# Patient Record
Sex: Male | Born: 1950 | Race: White | Hispanic: No | Smoking: Former smoker
Health system: Southern US, Community
[De-identification: ages and names within clinical notes are randomized; demographics above are authoritative.]

## PROBLEM LIST (undated history)

## (undated) DIAGNOSIS — M779 Enthesopathy, unspecified: Secondary | ICD-10-CM

## (undated) DIAGNOSIS — E7800 Pure hypercholesterolemia, unspecified: Secondary | ICD-10-CM

## (undated) DIAGNOSIS — M7060 Trochanteric bursitis, unspecified hip: Secondary | ICD-10-CM

## (undated) DIAGNOSIS — I10 Essential (primary) hypertension: Secondary | ICD-10-CM

## (undated) DIAGNOSIS — R519 Headache, unspecified: Secondary | ICD-10-CM

## (undated) DIAGNOSIS — F419 Anxiety disorder, unspecified: Secondary | ICD-10-CM

## (undated) DIAGNOSIS — E119 Type 2 diabetes mellitus without complications: Secondary | ICD-10-CM

## (undated) DIAGNOSIS — G8929 Other chronic pain: Secondary | ICD-10-CM

## (undated) DIAGNOSIS — E669 Obesity, unspecified: Secondary | ICD-10-CM

## (undated) DIAGNOSIS — I2510 Atherosclerotic heart disease of native coronary artery without angina pectoris: Secondary | ICD-10-CM

## (undated) DIAGNOSIS — M452 Ankylosing spondylitis of cervical region: Secondary | ICD-10-CM

## (undated) DIAGNOSIS — M549 Dorsalgia, unspecified: Secondary | ICD-10-CM

## (undated) DIAGNOSIS — M542 Cervicalgia: Secondary | ICD-10-CM

## (undated) DIAGNOSIS — K7460 Unspecified cirrhosis of liver: Secondary | ICD-10-CM

## (undated) DIAGNOSIS — F329 Major depressive disorder, single episode, unspecified: Secondary | ICD-10-CM

## (undated) DIAGNOSIS — N400 Enlarged prostate without lower urinary tract symptoms: Secondary | ICD-10-CM

## (undated) DIAGNOSIS — K219 Gastro-esophageal reflux disease without esophagitis: Secondary | ICD-10-CM

## (undated) DIAGNOSIS — M456 Ankylosing spondylitis lumbar region: Secondary | ICD-10-CM

## (undated) DIAGNOSIS — M1990 Unspecified osteoarthritis, unspecified site: Secondary | ICD-10-CM

## (undated) DIAGNOSIS — Z8719 Personal history of other diseases of the digestive system: Secondary | ICD-10-CM

## (undated) DIAGNOSIS — R51 Headache: Secondary | ICD-10-CM

## (undated) DIAGNOSIS — F32A Depression, unspecified: Secondary | ICD-10-CM

## (undated) DIAGNOSIS — M75100 Unspecified rotator cuff tear or rupture of unspecified shoulder, not specified as traumatic: Secondary | ICD-10-CM

## (undated) HISTORY — DX: Obesity, unspecified: E66.9

## (undated) HISTORY — PX: COLON SURGERY: SHX602

## (undated) HISTORY — DX: Major depressive disorder, single episode, unspecified: F32.9

## (undated) HISTORY — DX: Trochanteric bursitis, unspecified hip: M70.60

## (undated) HISTORY — DX: Enthesopathy, unspecified: M77.9

## (undated) HISTORY — PX: FRACTURE SURGERY: SHX138

## (undated) HISTORY — PX: CARPAL TUNNEL RELEASE: SHX101

## (undated) HISTORY — DX: Depression, unspecified: F32.A

## (undated) HISTORY — PX: OTHER SURGICAL HISTORY: SHX169

## (undated) HISTORY — DX: Ankylosing spondylitis of cervical region: M45.2

## (undated) HISTORY — PX: CORONARY ANGIOPLASTY WITH STENT PLACEMENT: SHX49

## (undated) HISTORY — DX: Ankylosing spondylitis lumbar region: M45.6

## (undated) HISTORY — DX: Anxiety disorder, unspecified: F41.9

## (undated) HISTORY — PX: LUMBAR DISC SURGERY: SHX700

## (undated) HISTORY — DX: Unspecified rotator cuff tear or rupture of unspecified shoulder, not specified as traumatic: M75.100

## (undated) HISTORY — PX: BACK SURGERY: SHX140

## (undated) HISTORY — PX: ANAL FISSURE REPAIR: SHX2312

## (undated) HISTORY — PX: SHOULDER ARTHROSCOPY W/ ROTATOR CUFF REPAIR: SHX2400

## (undated) HISTORY — PX: ANKLE FRACTURE SURGERY: SHX122

## (undated) HISTORY — PX: ULNAR TUNNEL RELEASE: SHX820

## (undated) HISTORY — DX: Essential (primary) hypertension: I10

---
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
## 1999-09-19 ENCOUNTER — Encounter: Payer: Self-pay | Admitting: *Deleted

## 1999-09-19 ENCOUNTER — Ambulatory Visit (HOSPITAL_COMMUNITY): Admission: RE | Admit: 1999-09-19 | Discharge: 1999-09-19 | Payer: Self-pay | Admitting: *Deleted

## 1999-10-02 ENCOUNTER — Encounter: Payer: Self-pay | Admitting: *Deleted

## 1999-10-02 ENCOUNTER — Ambulatory Visit (HOSPITAL_COMMUNITY): Admission: RE | Admit: 1999-10-02 | Discharge: 1999-10-02 | Payer: Self-pay | Admitting: *Deleted

## 1999-10-16 ENCOUNTER — Encounter: Payer: Self-pay | Admitting: *Deleted

## 1999-10-16 ENCOUNTER — Ambulatory Visit (HOSPITAL_COMMUNITY): Admission: RE | Admit: 1999-10-16 | Discharge: 1999-10-16 | Payer: Self-pay | Admitting: *Deleted

## 2001-02-01 ENCOUNTER — Encounter: Admission: RE | Admit: 2001-02-01 | Discharge: 2001-02-01 | Payer: Self-pay | Admitting: Neurosurgery

## 2001-02-01 ENCOUNTER — Encounter: Payer: Self-pay | Admitting: Neurosurgery

## 2002-02-08 ENCOUNTER — Encounter: Payer: Self-pay | Admitting: Emergency Medicine

## 2002-02-08 ENCOUNTER — Inpatient Hospital Stay (HOSPITAL_COMMUNITY): Admission: EM | Admit: 2002-02-08 | Discharge: 2002-02-11 | Payer: Self-pay | Admitting: Emergency Medicine

## 2002-02-08 ENCOUNTER — Encounter: Payer: Self-pay | Admitting: Orthopaedic Surgery

## 2002-02-09 ENCOUNTER — Encounter: Payer: Self-pay | Admitting: Orthopaedic Surgery

## 2002-04-25 ENCOUNTER — Ambulatory Visit (HOSPITAL_BASED_OUTPATIENT_CLINIC_OR_DEPARTMENT_OTHER): Admission: RE | Admit: 2002-04-25 | Discharge: 2002-04-25 | Payer: Self-pay | Admitting: Orthopaedic Surgery

## 2002-09-25 ENCOUNTER — Encounter: Admission: RE | Admit: 2002-09-25 | Discharge: 2002-12-24 | Payer: Self-pay | Admitting: Internal Medicine

## 2002-10-18 ENCOUNTER — Ambulatory Visit (HOSPITAL_COMMUNITY): Admission: RE | Admit: 2002-10-18 | Discharge: 2002-10-18 | Payer: Self-pay | Admitting: Internal Medicine

## 2002-10-30 ENCOUNTER — Encounter: Payer: Self-pay | Admitting: Orthopedic Surgery

## 2002-10-30 ENCOUNTER — Encounter
Admission: RE | Admit: 2002-10-30 | Discharge: 2003-01-28 | Payer: Self-pay | Admitting: Physical Medicine & Rehabilitation

## 2002-10-30 ENCOUNTER — Ambulatory Visit (HOSPITAL_COMMUNITY): Admission: RE | Admit: 2002-10-30 | Discharge: 2002-10-30 | Payer: Self-pay | Admitting: Orthopedic Surgery

## 2003-01-31 ENCOUNTER — Encounter
Admission: RE | Admit: 2003-01-31 | Discharge: 2003-05-01 | Payer: Self-pay | Admitting: Physical Medicine & Rehabilitation

## 2003-02-01 ENCOUNTER — Ambulatory Visit (HOSPITAL_COMMUNITY): Admission: RE | Admit: 2003-02-01 | Discharge: 2003-02-01 | Payer: Self-pay | Admitting: General Surgery

## 2003-02-08 ENCOUNTER — Encounter: Admission: RE | Admit: 2003-02-08 | Discharge: 2003-05-09 | Payer: Self-pay | Admitting: Internal Medicine

## 2003-05-10 ENCOUNTER — Encounter
Admission: RE | Admit: 2003-05-10 | Discharge: 2003-08-08 | Payer: Self-pay | Admitting: Physical Medicine & Rehabilitation

## 2003-08-16 ENCOUNTER — Encounter
Admission: RE | Admit: 2003-08-16 | Discharge: 2003-10-30 | Payer: Self-pay | Admitting: Physical Medicine & Rehabilitation

## 2003-09-27 ENCOUNTER — Ambulatory Visit (HOSPITAL_COMMUNITY): Admission: RE | Admit: 2003-09-27 | Discharge: 2003-09-27 | Payer: Self-pay | Admitting: General Surgery

## 2003-09-27 ENCOUNTER — Encounter (INDEPENDENT_AMBULATORY_CARE_PROVIDER_SITE_OTHER): Payer: Self-pay | Admitting: *Deleted

## 2003-09-27 ENCOUNTER — Ambulatory Visit (HOSPITAL_BASED_OUTPATIENT_CLINIC_OR_DEPARTMENT_OTHER): Admission: RE | Admit: 2003-09-27 | Discharge: 2003-09-27 | Payer: Self-pay | Admitting: General Surgery

## 2003-10-30 ENCOUNTER — Ambulatory Visit: Payer: Self-pay | Admitting: Physical Medicine & Rehabilitation

## 2003-10-30 ENCOUNTER — Encounter
Admission: RE | Admit: 2003-10-30 | Discharge: 2004-01-28 | Payer: Self-pay | Admitting: Physical Medicine & Rehabilitation

## 2004-02-20 ENCOUNTER — Encounter
Admission: RE | Admit: 2004-02-20 | Discharge: 2004-05-19 | Payer: Self-pay | Admitting: Physical Medicine & Rehabilitation

## 2004-02-22 ENCOUNTER — Ambulatory Visit: Payer: Self-pay | Admitting: Physical Medicine & Rehabilitation

## 2004-03-20 ENCOUNTER — Ambulatory Visit (HOSPITAL_COMMUNITY): Admission: RE | Admit: 2004-03-20 | Discharge: 2004-03-20 | Payer: Self-pay

## 2004-03-20 ENCOUNTER — Encounter (INDEPENDENT_AMBULATORY_CARE_PROVIDER_SITE_OTHER): Payer: Self-pay | Admitting: Specialist

## 2004-03-20 ENCOUNTER — Encounter (INDEPENDENT_AMBULATORY_CARE_PROVIDER_SITE_OTHER): Payer: Self-pay | Admitting: *Deleted

## 2004-03-27 ENCOUNTER — Ambulatory Visit (HOSPITAL_COMMUNITY): Admission: RE | Admit: 2004-03-27 | Discharge: 2004-03-27 | Payer: Self-pay | Admitting: Internal Medicine

## 2004-03-27 ENCOUNTER — Encounter (INDEPENDENT_AMBULATORY_CARE_PROVIDER_SITE_OTHER): Payer: Self-pay | Admitting: *Deleted

## 2004-04-08 ENCOUNTER — Encounter (HOSPITAL_COMMUNITY): Admission: RE | Admit: 2004-04-08 | Discharge: 2004-07-07 | Payer: Self-pay | Admitting: Internal Medicine

## 2004-05-15 ENCOUNTER — Encounter (HOSPITAL_COMMUNITY): Admission: RE | Admit: 2004-05-15 | Discharge: 2004-08-13 | Payer: Self-pay | Admitting: Internal Medicine

## 2004-05-19 ENCOUNTER — Encounter
Admission: RE | Admit: 2004-05-19 | Discharge: 2004-08-17 | Payer: Self-pay | Admitting: Physical Medicine & Rehabilitation

## 2004-05-22 ENCOUNTER — Ambulatory Visit: Payer: Self-pay | Admitting: Physical Medicine & Rehabilitation

## 2004-07-16 ENCOUNTER — Ambulatory Visit: Payer: Self-pay | Admitting: Physical Medicine and Rehabilitation

## 2004-08-04 ENCOUNTER — Encounter (HOSPITAL_COMMUNITY): Admission: RE | Admit: 2004-08-04 | Discharge: 2004-11-02 | Payer: Self-pay | Admitting: Internal Medicine

## 2004-08-21 ENCOUNTER — Encounter
Admission: RE | Admit: 2004-08-21 | Discharge: 2004-11-19 | Payer: Self-pay | Admitting: Physical Medicine and Rehabilitation

## 2004-08-21 ENCOUNTER — Ambulatory Visit: Payer: Self-pay | Admitting: Physical Medicine & Rehabilitation

## 2004-10-20 ENCOUNTER — Ambulatory Visit: Payer: Self-pay | Admitting: Physical Medicine & Rehabilitation

## 2004-11-11 ENCOUNTER — Encounter
Admission: RE | Admit: 2004-11-11 | Discharge: 2005-02-09 | Payer: Self-pay | Admitting: Physical Medicine & Rehabilitation

## 2004-11-25 ENCOUNTER — Encounter (HOSPITAL_COMMUNITY)
Admission: RE | Admit: 2004-11-25 | Discharge: 2005-01-30 | Payer: Self-pay | Admitting: Physical Medicine and Rehabilitation

## 2004-12-11 ENCOUNTER — Ambulatory Visit: Payer: Self-pay | Admitting: Physical Medicine & Rehabilitation

## 2005-01-20 ENCOUNTER — Encounter: Payer: Self-pay | Admitting: Gastroenterology

## 2005-02-13 ENCOUNTER — Encounter
Admission: RE | Admit: 2005-02-13 | Discharge: 2005-05-14 | Payer: Self-pay | Admitting: Physical Medicine & Rehabilitation

## 2005-02-13 ENCOUNTER — Ambulatory Visit: Payer: Self-pay | Admitting: Physical Medicine & Rehabilitation

## 2005-02-23 ENCOUNTER — Encounter (HOSPITAL_COMMUNITY): Admission: RE | Admit: 2005-02-23 | Discharge: 2005-05-24 | Payer: Self-pay | Admitting: Internal Medicine

## 2005-04-08 ENCOUNTER — Ambulatory Visit: Payer: Self-pay | Admitting: Physical Medicine & Rehabilitation

## 2005-05-04 ENCOUNTER — Encounter: Payer: Self-pay | Admitting: Gastroenterology

## 2005-05-05 ENCOUNTER — Ambulatory Visit: Payer: Self-pay | Admitting: Physical Medicine & Rehabilitation

## 2005-05-08 ENCOUNTER — Encounter: Admission: RE | Admit: 2005-05-08 | Discharge: 2005-05-08 | Payer: Self-pay | Admitting: *Deleted

## 2005-06-01 ENCOUNTER — Encounter (HOSPITAL_COMMUNITY): Admission: RE | Admit: 2005-06-01 | Discharge: 2005-08-30 | Payer: Self-pay | Admitting: Internal Medicine

## 2005-06-05 ENCOUNTER — Ambulatory Visit: Payer: Self-pay | Admitting: Physical Medicine & Rehabilitation

## 2005-06-05 ENCOUNTER — Encounter
Admission: RE | Admit: 2005-06-05 | Discharge: 2005-09-03 | Payer: Self-pay | Admitting: Physical Medicine & Rehabilitation

## 2005-07-07 ENCOUNTER — Ambulatory Visit: Payer: Self-pay | Admitting: Physical Medicine & Rehabilitation

## 2005-07-31 ENCOUNTER — Ambulatory Visit: Payer: Self-pay | Admitting: Physical Medicine & Rehabilitation

## 2005-08-28 ENCOUNTER — Encounter (INDEPENDENT_AMBULATORY_CARE_PROVIDER_SITE_OTHER): Payer: Self-pay | Admitting: *Deleted

## 2005-08-28 ENCOUNTER — Encounter
Admission: RE | Admit: 2005-08-28 | Discharge: 2005-11-26 | Payer: Self-pay | Admitting: Physical Medicine & Rehabilitation

## 2005-08-28 ENCOUNTER — Encounter: Admission: RE | Admit: 2005-08-28 | Discharge: 2005-08-28 | Payer: Self-pay | Admitting: Internal Medicine

## 2005-09-02 ENCOUNTER — Ambulatory Visit (HOSPITAL_COMMUNITY)
Admission: RE | Admit: 2005-09-02 | Discharge: 2005-09-02 | Payer: Self-pay | Admitting: Physical Medicine & Rehabilitation

## 2005-10-06 ENCOUNTER — Ambulatory Visit: Payer: Self-pay | Admitting: Physical Medicine & Rehabilitation

## 2005-10-07 ENCOUNTER — Encounter (HOSPITAL_COMMUNITY): Admission: RE | Admit: 2005-10-07 | Discharge: 2006-01-05 | Payer: Self-pay | Admitting: Internal Medicine

## 2005-11-10 ENCOUNTER — Ambulatory Visit: Payer: Self-pay | Admitting: Physical Medicine & Rehabilitation

## 2005-12-10 ENCOUNTER — Encounter
Admission: RE | Admit: 2005-12-10 | Discharge: 2006-03-10 | Payer: Self-pay | Admitting: Physical Medicine & Rehabilitation

## 2005-12-10 ENCOUNTER — Ambulatory Visit: Payer: Self-pay | Admitting: Physical Medicine and Rehabilitation

## 2006-01-05 ENCOUNTER — Ambulatory Visit: Payer: Self-pay | Admitting: Physical Medicine & Rehabilitation

## 2006-01-21 ENCOUNTER — Ambulatory Visit (HOSPITAL_BASED_OUTPATIENT_CLINIC_OR_DEPARTMENT_OTHER): Admission: RE | Admit: 2006-01-21 | Discharge: 2006-01-21 | Payer: Self-pay | Admitting: Orthopedic Surgery

## 2006-01-27 ENCOUNTER — Encounter (HOSPITAL_COMMUNITY): Admission: RE | Admit: 2006-01-27 | Discharge: 2006-04-27 | Payer: Self-pay | Admitting: Internal Medicine

## 2006-03-03 ENCOUNTER — Ambulatory Visit: Payer: Self-pay | Admitting: Physical Medicine & Rehabilitation

## 2006-03-29 ENCOUNTER — Encounter
Admission: RE | Admit: 2006-03-29 | Discharge: 2006-06-27 | Payer: Self-pay | Admitting: Physical Medicine & Rehabilitation

## 2006-04-27 ENCOUNTER — Ambulatory Visit: Payer: Self-pay | Admitting: Physical Medicine & Rehabilitation

## 2006-05-19 ENCOUNTER — Encounter (HOSPITAL_COMMUNITY): Admission: RE | Admit: 2006-05-19 | Discharge: 2006-07-15 | Payer: Self-pay | Admitting: Internal Medicine

## 2006-06-22 ENCOUNTER — Encounter
Admission: RE | Admit: 2006-06-22 | Discharge: 2006-09-20 | Payer: Self-pay | Admitting: Physical Medicine & Rehabilitation

## 2006-06-22 ENCOUNTER — Ambulatory Visit: Payer: Self-pay | Admitting: Physical Medicine & Rehabilitation

## 2006-08-23 ENCOUNTER — Ambulatory Visit: Payer: Self-pay | Admitting: Physical Medicine & Rehabilitation

## 2006-10-06 ENCOUNTER — Encounter (HOSPITAL_COMMUNITY): Admission: RE | Admit: 2006-10-06 | Discharge: 2006-12-10 | Payer: Self-pay | Admitting: Internal Medicine

## 2006-10-14 ENCOUNTER — Encounter
Admission: RE | Admit: 2006-10-14 | Discharge: 2006-10-14 | Payer: Self-pay | Admitting: Physical Medicine & Rehabilitation

## 2006-10-20 ENCOUNTER — Ambulatory Visit: Payer: Self-pay | Admitting: Physical Medicine & Rehabilitation

## 2006-12-17 ENCOUNTER — Ambulatory Visit: Payer: Self-pay | Admitting: Physical Medicine & Rehabilitation

## 2006-12-17 ENCOUNTER — Encounter
Admission: RE | Admit: 2006-12-17 | Discharge: 2007-01-15 | Payer: Self-pay | Admitting: Physical Medicine & Rehabilitation

## 2006-12-29 ENCOUNTER — Encounter (HOSPITAL_COMMUNITY): Admission: RE | Admit: 2006-12-29 | Discharge: 2007-02-10 | Payer: Self-pay | Admitting: Internal Medicine

## 2007-01-17 ENCOUNTER — Encounter: Admission: RE | Admit: 2007-01-17 | Discharge: 2007-01-17 | Payer: Self-pay | Admitting: Internal Medicine

## 2007-03-11 ENCOUNTER — Encounter: Admission: RE | Admit: 2007-03-11 | Discharge: 2007-03-11 | Payer: Self-pay | Admitting: *Deleted

## 2007-03-17 ENCOUNTER — Inpatient Hospital Stay (HOSPITAL_COMMUNITY): Admission: AD | Admit: 2007-03-17 | Discharge: 2007-03-18 | Payer: Self-pay | Admitting: *Deleted

## 2007-04-20 ENCOUNTER — Encounter (HOSPITAL_COMMUNITY): Admission: RE | Admit: 2007-04-20 | Discharge: 2007-04-21 | Payer: Self-pay | Admitting: Internal Medicine

## 2007-05-02 ENCOUNTER — Encounter
Admission: RE | Admit: 2007-05-02 | Discharge: 2007-05-04 | Payer: Self-pay | Admitting: Physical Medicine & Rehabilitation

## 2007-05-04 ENCOUNTER — Ambulatory Visit: Payer: Self-pay | Admitting: Physical Medicine & Rehabilitation

## 2007-07-20 ENCOUNTER — Encounter (HOSPITAL_COMMUNITY): Admission: RE | Admit: 2007-07-20 | Discharge: 2007-09-12 | Payer: Self-pay | Admitting: Internal Medicine

## 2007-08-01 ENCOUNTER — Encounter
Admission: RE | Admit: 2007-08-01 | Discharge: 2007-08-03 | Payer: Self-pay | Admitting: Physical Medicine & Rehabilitation

## 2007-08-03 ENCOUNTER — Ambulatory Visit: Payer: Self-pay | Admitting: Physical Medicine & Rehabilitation

## 2007-10-19 ENCOUNTER — Encounter (HOSPITAL_COMMUNITY): Admission: RE | Admit: 2007-10-19 | Discharge: 2007-12-07 | Payer: Self-pay | Admitting: Internal Medicine

## 2007-10-28 ENCOUNTER — Encounter
Admission: RE | Admit: 2007-10-28 | Discharge: 2007-10-28 | Payer: Self-pay | Admitting: Physical Medicine & Rehabilitation

## 2007-10-31 ENCOUNTER — Ambulatory Visit: Payer: Self-pay | Admitting: Physical Medicine & Rehabilitation

## 2008-01-20 ENCOUNTER — Encounter
Admission: RE | Admit: 2008-01-20 | Discharge: 2008-01-20 | Payer: Self-pay | Admitting: Physical Medicine & Rehabilitation

## 2008-01-20 ENCOUNTER — Ambulatory Visit: Payer: Self-pay | Admitting: Physical Medicine & Rehabilitation

## 2008-03-07 ENCOUNTER — Encounter: Admission: RE | Admit: 2008-03-07 | Discharge: 2008-03-07 | Payer: Self-pay | Admitting: Internal Medicine

## 2008-04-10 ENCOUNTER — Encounter
Admission: RE | Admit: 2008-04-10 | Discharge: 2008-04-11 | Payer: Self-pay | Admitting: Physical Medicine & Rehabilitation

## 2008-04-11 ENCOUNTER — Ambulatory Visit: Payer: Self-pay | Admitting: Physical Medicine & Rehabilitation

## 2008-05-14 ENCOUNTER — Encounter: Payer: Self-pay | Admitting: Gastroenterology

## 2008-08-07 ENCOUNTER — Encounter
Admission: RE | Admit: 2008-08-07 | Discharge: 2008-08-10 | Payer: Self-pay | Admitting: Physical Medicine & Rehabilitation

## 2008-08-10 ENCOUNTER — Ambulatory Visit: Payer: Self-pay | Admitting: Physical Medicine & Rehabilitation

## 2008-10-31 ENCOUNTER — Encounter
Admission: RE | Admit: 2008-10-31 | Discharge: 2008-11-07 | Payer: Self-pay | Admitting: Physical Medicine & Rehabilitation

## 2008-11-07 ENCOUNTER — Ambulatory Visit: Payer: Self-pay | Admitting: Physical Medicine & Rehabilitation

## 2009-01-03 ENCOUNTER — Encounter (INDEPENDENT_AMBULATORY_CARE_PROVIDER_SITE_OTHER): Payer: Self-pay | Admitting: *Deleted

## 2009-01-03 ENCOUNTER — Encounter: Admission: RE | Admit: 2009-01-03 | Discharge: 2009-01-03 | Payer: Self-pay | Admitting: Internal Medicine

## 2009-01-29 ENCOUNTER — Encounter
Admission: RE | Admit: 2009-01-29 | Discharge: 2009-01-30 | Payer: Self-pay | Admitting: Physical Medicine & Rehabilitation

## 2009-01-30 ENCOUNTER — Ambulatory Visit: Payer: Self-pay | Admitting: Physical Medicine & Rehabilitation

## 2009-04-19 ENCOUNTER — Encounter
Admission: RE | Admit: 2009-04-19 | Discharge: 2009-04-24 | Payer: Self-pay | Admitting: Physical Medicine & Rehabilitation

## 2009-04-24 ENCOUNTER — Ambulatory Visit: Payer: Self-pay | Admitting: Physical Medicine & Rehabilitation

## 2009-07-10 ENCOUNTER — Encounter
Admission: RE | Admit: 2009-07-10 | Discharge: 2009-07-18 | Payer: Self-pay | Admitting: Physical Medicine & Rehabilitation

## 2009-07-18 ENCOUNTER — Ambulatory Visit: Payer: Self-pay | Admitting: Physical Medicine & Rehabilitation

## 2009-07-30 ENCOUNTER — Encounter (INDEPENDENT_AMBULATORY_CARE_PROVIDER_SITE_OTHER): Payer: Self-pay | Admitting: *Deleted

## 2009-09-05 ENCOUNTER — Ambulatory Visit: Payer: Self-pay | Admitting: Gastroenterology

## 2009-09-05 DIAGNOSIS — Z860100 Personal history of colon polyps, unspecified: Secondary | ICD-10-CM | POA: Insufficient documentation

## 2009-09-05 DIAGNOSIS — Z862 Personal history of diseases of the blood and blood-forming organs and certain disorders involving the immune mechanism: Secondary | ICD-10-CM

## 2009-09-05 DIAGNOSIS — E119 Type 2 diabetes mellitus without complications: Secondary | ICD-10-CM

## 2009-09-05 DIAGNOSIS — M1480 Arthropathies in other specified diseases classified elsewhere, unspecified site: Secondary | ICD-10-CM

## 2009-09-05 DIAGNOSIS — I2510 Atherosclerotic heart disease of native coronary artery without angina pectoris: Secondary | ICD-10-CM

## 2009-09-05 LAB — CONVERTED CEMR LAB
Eosinophils Absolute: 0.1 10*3/uL (ref 0.0–0.7)
Eosinophils Relative: 1.6 % (ref 0.0–5.0)
INR: 1 (ref 0.8–1.0)
Lymphocytes Relative: 29.4 % (ref 12.0–46.0)
Lymphs Abs: 1.6 10*3/uL (ref 0.7–4.0)
MCHC: 34.8 g/dL (ref 30.0–36.0)
MCV: 93.2 fL (ref 78.0–100.0)
Neutro Abs: 3.3 10*3/uL (ref 1.4–7.7)
Platelets: 140 10*3/uL — ABNORMAL LOW (ref 150.0–400.0)
Prothrombin Time: 11 s (ref 9.7–11.8)
RBC: 4.5 M/uL (ref 4.22–5.81)

## 2009-09-06 ENCOUNTER — Encounter: Payer: Self-pay | Admitting: Gastroenterology

## 2009-09-06 ENCOUNTER — Telehealth: Payer: Self-pay | Admitting: Gastroenterology

## 2009-09-10 ENCOUNTER — Encounter: Payer: Self-pay | Admitting: Gastroenterology

## 2009-09-19 ENCOUNTER — Ambulatory Visit (HOSPITAL_COMMUNITY): Admission: RE | Admit: 2009-09-19 | Discharge: 2009-09-19 | Payer: Self-pay | Admitting: Gastroenterology

## 2009-09-19 ENCOUNTER — Ambulatory Visit: Payer: Self-pay | Admitting: Gastroenterology

## 2009-10-02 ENCOUNTER — Encounter
Admission: RE | Admit: 2009-10-02 | Discharge: 2009-12-24 | Payer: Self-pay | Admitting: Physical Medicine & Rehabilitation

## 2009-10-09 ENCOUNTER — Ambulatory Visit: Payer: Self-pay | Admitting: Physical Medicine & Rehabilitation

## 2009-12-24 ENCOUNTER — Encounter
Admission: RE | Admit: 2009-12-24 | Discharge: 2010-03-04 | Payer: Self-pay | Source: Home / Self Care | Attending: Physical Medicine & Rehabilitation | Admitting: Physical Medicine & Rehabilitation

## 2010-01-01 ENCOUNTER — Ambulatory Visit: Payer: Self-pay | Admitting: Physical Medicine & Rehabilitation

## 2010-03-04 NOTE — Letter (Signed)
Summary: Anticoagulation Modification Letter  [facility]  [address] [place], [place] [address]   Phone: [phone]  Fax: [phone]    September 06, 2009  Re:    Daniel Rivers DOB:    1950-06-07 MRN:    [id number]    Dear Dr Ricki Miller, We have scheduled the above patient for an endoscopic procedure. Our records show that  he/she is on anticoagulation therapy. Please advise as to how long the patient may come off their therapy of Plavix prior to the scheduled procedure(s) on 09/19/2009   Please fax back/or route the completed form to Robin  at [phone]  Thank you for your help with this matter.  Sincerely,  Merri Ray CMA [facility])   Physician Recommendation:  Hold Plavix 7 days  09/13/2009  Appended Document: Anticoagulation Modification Letter Recieved OK for pt to come off Plavix 7 days

## 2010-03-04 NOTE — Letter (Signed)
Summary: [facility]   Imported By: Sherian Rein 09/09/2009 10:25:01  _____________________________________________________________________  External Attachment:    Type:   Image     Comment:   External Document

## 2010-03-04 NOTE — Assessment & Plan Note (Signed)
Summary: discuss colon on Plavix--ch.   History of Present Illness Visit Type: Initial Consult Primary GI MD: Melvia Heaps MD [facility] Primary Provider: Iven Finn Requesting Provider: Iven Finn Chief Complaint: Discuss recall colonoscopy History of Present Illness:   Daniel Rivers is a 60 year old white male with a history of hemochromatosis and colon polyps referred at the request of Dr. Ricki Miller for  colonoscopy.  Last colonoscopy was 2007.  He has no GI complaints except for chronic constipation which he attributes to chronic narcotic use secondary to back problems.  He has ankylosing spondylitis.  He has dysphagia which he attributes to cervical spine surgery.  He denies melena or hematochezia.  He is on daily Plavix.  Patient has coronary artery disease, diabetes and hypertension.   GI Review of Systems    Reports belching, bloating, and  dysphagia with solids.      Denies abdominal pain, acid reflux, chest pain, dysphagia with liquids, heartburn, loss of appetite, nausea, vomiting, vomiting blood, weight loss, and  weight gain.      Reports constipation and  liver problems.     Denies anal fissure, black tarry stools, change in bowel habit, diarrhea, diverticulosis, fecal incontinence, heme positive stool, hemorrhoids, irritable bowel syndrome, jaundice, light color stool, rectal bleeding, and  rectal pain.    Current Medications (verified): 1)  Metformin Hcl 1000 Mg Tabs (Metformin Hcl) .[name].. 1 By Mouth Two Times A Day 2)  Multivitamins  Tabs (Multiple Vitamin) .[name].. 1 By Mouth Once Daily 3)  Metamucil 30.9 % Powd (Psyllium) .... Two Times A Day 4)  Hydrocodone-Acetaminophen 7.5-325 Mg Tabs (Hydrocodone-Acetaminophen) .[name].. 1 Four Times A Day As Needed 5)  Glycolax  Powd (Polyethylene Glycol 3350) .... Two Times A Day 6)  Dulcolax 5 Mg Tbec (Bisacodyl) .[name].. 1po At Bedtime 7)  Fish Oil  Oil (Fish Oil) .[name].. 1 Two Times A Day 8)  Morphine Sulfate Cr 60 Mg Xr12h-Tab (Morphine  Sulfate) .... Three Times A Day 9)  Ambien 10 Mg Tabs (Zolpidem Tartrate) .[name].. 1 At Bedtime 10)  Plavix 75 Mg Tabs (Clopidogrel Bisulfate) .[name].. 1 By Mouth Once Daily 11)  Coreg 3.125 Mg Tabs (Carvedilol) .[name].. 1 By Mouth Two Times A Day 12)  Simvastatin 40 Mg Tabs (Simvastatin) .... 1/2 By Mouth Once Daily 13)  Aspirin 81 Mg Tbec (Aspirin) .[name].. 1 By Mouth Once Daily 14)  Losartan Potassium 100 Mg Tabs (Losartan Potassium) .[name].. 1 By Mouth Once Daily 15)  Magnesium Oxide 400 Mg Caps (Magnesium Oxide) .[name].. 1 By Mouth Once Daily 16)  Flomax 0.4 Mg Caps (Tamsulosin Hcl) .[name].. 1 By Mouth At Bedtime 17)  Vitamin D3 2000 Unit Caps (Cholecalciferol) .[name].. 1 By Mouth Once Daily 18)  Stool Softener 100 Mg Caps (Docusate Sodium) .... As Needed 19)  Polyethylene Glycol 3350  Liqd (Polyethylene Glycol 3350) .[name].. 17grams Every 12 Hours  Allergies (verified): 1)  ! * Lotrel 2)  ! * Naproxen  Past History:  Past Medical History: Adenomatous polyp Hemochromatosis Hypertension Diabetes Chronic back pains Ankylosing spondylitis Anal fissure Arthritis colon polyps CAD Obesity Hx of bowel obstruction  Past Surgical History: Angioplasty/stent small bowel surgery  1965 orthopedic surgeries  Review of Systems       The patient complains of allergy/sinus, back pain, fatigue, headaches-new, hearing problems, itching, night sweats, sleeping problems, and voice change.  The patient denies anemia, anxiety-new, arthritis/joint pain, blood in urine, breast changes/lumps, change in vision, confusion, cough, coughing up blood, depression-new, fever, heart murmur, heart rhythm changes,  menstrual pain, muscle pains/cramps, nosebleeds, pregnancy symptoms, shortness of breath, skin rash, sore throat, swelling of feet/legs, swollen lymph glands, thirst - excessive , urination - excessive , urination changes/pain, urine leakage, and vision changes.         All other systems were reviewed and were negative   Vital  Signs:  Patient profile:   60 year old male Height:      75 inches Weight:      249 pounds BMI:     31.24 BSA:     2.41 Pulse rate:   80 / minute Pulse rhythm:   regular BP sitting:   94 / 70  (left arm)  Vitals Entered By: Merri Ray CMA (AAMA) (September 05, 2009 10:15 AM)  Physical Exam  Additional Exam:  On physical exam he is a well-developed large male  skin: anicteric HEENT: normocephalic; PEERLA; no nasal or pharyngeal abnormalities neck: supple nodes: no cervical lymphadenopathy chest: clear to ausculatation and percussion heart: no murmurs, gallops, or rubs abd: soft, nontender; BS normoactive; no abdominal masses, tenderness, organomegaly rectal: deferred ext: no cynanosis, clubbing, edema skeletal: no deformities neuro: oriented x 3; no focal abnormalities    Impression & Recommendations:  Problem # 1:  PERSONAL HISTORY OF COLONIC POLYPS (ICD-V12.72)  Patient is due for colonoscopy.  Plavix will be held in anticipation of the exam ig permitted by cardiology.  The risks and alternatives for holding anticoagulants or antiplatelet medicines were discussed with the patient. Risks, alternatives, and complications of the procedure, including bleeding, perforation, and possible need for surgery, were explained to the patient.  Patient's questions were answered.  Orders: TLB-CBC Platelet - w/Differential (85025-CBCD) TLB-Ferritin (82728-FER) TLB-PT (Protime) (85610-PTP) TLB-PTT (85730-PTTL) ZCOL (ZCOL)  Problem # 2:  HEMOCHROMATOSIS, HX OF (ICD-V12.3)  Patient has biopsy proven hemochromatosis.  To my knowledge he has not undergone phlebotomy.  Recommendations #1 check ferritin level, CBC and INR  Orders: TLB-CBC Platelet - w/Differential (85025-CBCD) TLB-Ferritin (82728-FER) TLB-PT (Protime) (85610-PTP) TLB-PTT (85730-PTTL)  Problem # 3:  CAD (ICD-414.00) Assessment: Comment Only  Problem # 4:  DM (ICD-250.00) Assessment: Comment Only  Patient  Instructions: 1)  Copy sent to : Richard Pang,MD 2)  Colonoscopy and Flexible Sigmoidoscopy brochure given.  3)  Conscious Sedation brochure given.  4)  Your colonoscopy is scheduled for 09/19/2009 at 1pm to arrive at 11am 5)  We are also giving you a sample MoviPrep kit  6)  Your Prep is being sent to your pharmacy today 7)  You have been instructed on Diabetic meds 8)  We will contact you about coming off your Plavix 9)  The medication list was reviewed and reconciled.  All changed / newly prescribed medications were explained.  A complete medication list was provided to the patient / caregiver. Prescriptions: MOVIPREP 100 GM  SOLR (PEG-KCL-NACL-NASULF-NA ASC-C) As per prep instructions.  #1 x 0   Entered by:   Merri Ray CMA (AAMA)   Authorized by:   Louis Meckel MD   Signed by:   Merri Ray CMA (AAMA) on 09/05/2009   Method used:   Electronically to        [facility] (retail)       [address]       [place], [place]  [id number]       Ph: [phone]       Fax: [phone]   RxID:   [id number]

## 2010-03-04 NOTE — Procedures (Signed)
Summary: Colonoscopy  Patient: [name] Note: All result statuses are Final unless otherwise noted.  Tests: (1) Colonoscopy (COL)   COL Colonoscopy           DONE     [facility]     [address] [place], [place]  [address]           COLONOSCOPY PROCEDURE REPORT           PATIENT:  Daniel Rivers, Daniel Rivers  MR#:  [id number]     BIRTHDATE:  November 16, 1950, 59 yrs. old  GENDER:  male     ENDOSCOPIST:  Barbette Hair. Arlyce Dice, MD     REF. BY:     PROCEDURE DATE:  09/19/2009     PROCEDURE:  Diagnostic Colonoscopy     ASA CLASS:  Class II     INDICATIONS:  screening, history of pre-cancerous (adenomatous)     colon polyps last exam 2007     MEDICATIONS:   MAC sedation, administered by CRNA           DESCRIPTION OF PROCEDURE:   After the risks benefits and     alternatives of the procedure were thoroughly explained, informed     consent was obtained.  Digital rectal exam was performed and     revealed no abnormalities.   The EC-[id number] ([id number]) endoscope was     introduced through the anus and advanced to the cecum, which was     identified by the ileocecal valve, without limitations.  The     quality of the prep was excellent, using MoviPrep.  The instrument     was then slowly withdrawn as the colon was fully examined.     <<PROCEDUREIMAGES>>           FINDINGS:  A normal appearing cecum, ileocecal valve, and     appendiceal orifice were identified. The ascending, hepatic     flexure, transverse, splenic flexure, descending, sigmoid colon,     and rectum appeared unremarkable (see image001, image002,     image003, image005, and image006).   Retroflexed views in the     rectum revealed no abnormalities.    The scope was then withdrawn     from the patient and the procedure completed.           COMPLICATIONS:  None     ENDOSCOPIC IMPRESSION:     1) Normal colon     RECOMMENDATIONS:     1) Colonoscopy     REPEAT EXAM:  In 10 year(s) for Colonoscopy.        ______________________________     Barbette Hair. Arlyce Dice, MD           CC:  Juline Patch, MD           n.     Rosalie Doctor:   Barbette Hair. Ekaterini Capitano at 09/19/2009 01:42 PM           Dennard Nip, [id number]  Note: An exclamation mark (!) indicates a result that was not dispersed into the flowsheet. Document Creation Date: 09/19/2009 1:43 PM _______________________________________________________________________  (1) Order result status: Final Collection or observation date-time: 09/19/2009 13:38 Requested date-time:  Receipt date-time:  Reported date-time:  Referring Physician:   Ordering Physician: Melvia Heaps [phone]) Specimen Source:  Source: Launa Grill Order Number: [id number] Lab site:   Appended Document: Colonoscopy Recall is in IDX for 09/2010.

## 2010-03-04 NOTE — Miscellaneous (Signed)
Summary: Ok for pt to come off Plavix  Clinical Lists Changes Ok for pt to come off Plavix per Fax that was faxed over from Dr Gerlene Burdock Pang,MD. Will send letter down to be scanned.  Appended Document: Ok for pt to come off Plavix Called pt to inform OK to come off Plavix

## 2010-03-04 NOTE — Letter (Signed)
Summary: [facility]   Imported By: Sherian Rein 09/09/2009 10:24:10  _____________________________________________________________________  External Attachment:    Type:   Image     Comment:   External Document

## 2010-03-04 NOTE — Procedures (Signed)
Summary: Preparation for Colonoscopy / [facility]  Preparation for Colonoscopy / [facility]   Imported By: Lennie Odor 09/18/2009 15:14:35  _____________________________________________________________________  External Attachment:    Type:   Image     Comment:   External Document

## 2010-03-04 NOTE — Letter (Signed)
Summary: New Patient letter  [facility]  [address] [place], [place] [address]   Phone: [phone]  Fax: [phone]       07/30/2009 MRN: [id number]  Daniel Rivers [address] [place], [place]  [address]  Dear Mr. Schnorr,  Welcome to the Gastroenterology Division at [facility].    You are scheduled to see Dr.  Arlyce Dice on 09-05-09 at 10:00a.m. on the 3rd floor at [facility], [address]  We ask that you try to arrive at our office 15 minutes prior to your appointment time to allow for check-in.  We would like you to complete the enclosed self-administered evaluation form prior to your visit and bring it with you on the day of your appointment.  We will review it with you.  Also, please bring a complete list of all your medications or, if you prefer, bring the medication bottles and we will list them.  Please bring your insurance card so that we may make a copy of it.  If your insurance requires a referral to see a specialist, please bring your referral form from your primary care physician.  Co-payments are due at the time of your visit and may be paid by cash, check or credit card.     Your office visit will consist of a consult with your physician (includes a physical exam), any laboratory testing he/she may order, scheduling of any necessary diagnostic testing (e.g. x-ray, ultrasound, CT-scan), and scheduling of a procedure (e.g. Endoscopy, Colonoscopy) if required.  Please allow enough time on your schedule to allow for any/all of these possibilities.    If you cannot keep your appointment, please call [phone] to cancel or reschedule prior to your appointment date.  This allows [place] the opportunity to schedule an appointment for another patient in need of care.  If you do not cancel or reschedule by 5 p.m. the business day prior to your appointment date, you will be charged a $50.00 late cancellation/no-show fee.    Thank you for choosing  [facility] for your medical needs.  We appreciate the opportunity to care for you.  Please visit [place] at our website  to learn more about our practice.                     Sincerely,                                                             The Gastroenterology Division

## 2010-03-04 NOTE — Letter (Signed)
Summary: Results Letter  [facility]  [address] [place], [place] [address]   Phone: [phone]  Fax: [phone]        September 05, 2009 MRN: [id number]    Daniel Rivers [address] [place], [place]  [address]    Dear Mr. Hoxworth,  It is my pleasure to have treated you recently as a new patient in my office. I appreciate your confidence and the opportunity to participate in your care.  Since I do have a busy inpatient endoscopy schedule and office schedule, my office hours vary weekly. I am, however, available for emergency calls everyday through my office. If I am not available for an urgent office appointment, another one of our gastroenterologist will be able to assist you.  My well-trained staff are prepared to help you at all times. For emergencies after office hours, a physician from our Gastroenterology section is always available through my 24 hour answering service  Once again I welcome you as a new patient and I look forward to a happy and healthy relationship             Sincerely,  Louis Meckel MD  This letter has been electronically signed by your physician.  Appended Document: Results Letter LETTER MAILED

## 2010-03-04 NOTE — Letter (Signed)
Summary: Diabetic Instructions  [facility]  [address] [place], [place] [address]   Phone: [phone]  Fax: [phone]    Daniel Rivers 09/16/1950 MRN: [id number]   X  ORAL DIABETIC MEDICATION INSTRUCTIONS  The day before your procedure:   Take your diabetic pill as you do normally  The day of your procedure:   Do not take your diabetic pill    We will check your blood sugar levels during the admission process and again in Recovery before discharging you home  ________________________________________________________________________  _  _   INSULIN (LONG ACTING) MEDICATION INSTRUCTIONS (Lantus, NPH, 70/30, Humulin, Novolin-N)   The day before your procedure:   Take  your regular evening dose    The day of your procedure:   Do not take your morning dose    _  _   INSULIN (SHORT ACTING) MEDICATION INSTRUCTIONS (Regular, Humulog, Novolog)   The day before your procedure:   Do not take your evening dose   The day of your procedure:   Do not take your morning dose   _  _   INSULIN PUMP MEDICATION INSTRUCTIONS  We will contact the physician managing your diabetic care for written dosage instructions for the day before your procedure and the day of your procedure.  Once we have received the instructions, we will contact you.

## 2010-03-04 NOTE — Letter (Signed)
Summary: [facility]   Imported By: Sherian Rein 09/09/2009 10:23:25  _____________________________________________________________________  External Attachment:    Type:   Image     Comment:   External Document

## 2010-03-04 NOTE — Progress Notes (Signed)
Summary: Dr Ricki Miller (Plavix)  Phone Note Outgoing Call   Call placed by: Merri Ray CMA [facility]),  September 06, 2009 8:16 AM Summary of Call: Called pt to find out who the prescriber of his Plavix is. He stated it used to be Dr Lennox Solders but he has left his practice and Dr Ricki Miller has been refilling Plavix. Pt stated that I need to contact Dr Ricki Miller. Initial call taken by: Merri Ray CMA [facility]),  September 06, 2009 8:17 AM

## 2010-03-04 NOTE — Letter (Signed)
Summary: Anticoagulation/[facility]  Anticoagulation/[facility]   Imported By: Sherian Rein 09/13/2009 09:42:22  _____________________________________________________________________  External Attachment:    Type:   Image     Comment:   External Document

## 2010-03-04 NOTE — Letter (Signed)
Summary: [facility] Instructions  [facility]  [address] [place], [place] [address]   Phone: [phone]  Fax: [phone]       DAKOTAH ORREGO    [birth year]    MRN: [id number]        Procedure Day /Date:THURSDAY 09/19/2009     Arrival Time:11AM     Procedure Time:1PM                                              PT USING 2 MOVIPREPS HAS BEEN INSTRUCTED   Location of Procedure:                     X   [facility] ( Outpatient Registration)   PREPARATION FOR COLONOSCOPY WITH MOVIPREP/PROPOFUL   Starting 5 days prior to your procedure8/13/2011 do not eat nuts, seeds, popcorn, corn, beans, peas,  salads, or any raw vegetables.  Do not take any fiber supplements (e.g. Metamucil, Citrucel, and Benefiber).  2 DAYS BEFORE YOUR PROCEDURE         DATE:09/17/2009  DAY: TUESDAY   STAY ON CLEAR LIQUIDS FOR 2 DAYS START DRINKING PREP DAY BEFORE 1.  Drink clear liquids the entire day-NO SOLID FOOD  2.  Do not drink anything colored red or purple.  Avoid juices with pulp.  No orange juice.  3.  Drink at least 64 oz. (8 glasses) of fluid/clear liquids during the day to prevent dehydration and help the prep work efficiently.  CLEAR LIQUIDS INCLUDE: Water Jello Ice Popsicles Tea (sugar ok, no milk/cream) Powdered fruit flavored drinks Coffee (sugar ok, no milk/cream) Gatorade Juice: apple, white grape, white cranberry  Lemonade Clear bullion, consomm, broth Carbonated beverages (any kind) Strained chicken noodle soup Hard Candy                         NOTHING TO EAT OR DRINK AFTER MIDNIGHT EXCEPT FOR PREP   4.  In the morning, mix first dose of MoviPrep solution:    Empty 1 Pouch A and 1 Pouch B into the disposable container    Add lukewarm drinking water to the top line of the container. Mix to dissolve    Refrigerate (mixed solution should be used within 24 hrs)  5.  Begin drinking the prep at 5:00 p.m. The MoviPrep container is divided by 4 marks.    Every 15 minutes drink the solution down to the next mark (approximately 8 oz) until the full liter is complete.   6.  Follow completed prep with 16 oz of clear liquid of your choice (Nothing red or purple).  Continue to drink clear liquids until bedtime.  7.  Before going to bed, mix second dose of MoviPrep solution:    Empty 1 Pouch A and 1 Pouch B into the disposable container    Add lukewarm drinking water to the top line of the container. Mix to dissolve    Refrigerate  THE DAY OF YOUR PROCEDURE      DATE:09/19/2009 DAY: THURSDAY  Beginning at 8a.m. (5 hours before procedure):         1. Every 15 minutes, drink the solution down to the next mark (approx 8 oz) until the full liter is complete.  2. Follow completed prep with 16 oz. of clear liquid of your choice.  3. You may drink clear liquids until 9AM (2 HOURS BEFORE PROCEDURE).   MEDICATION INSTRUCTIONS  Unless otherwise instructed, you should take regular prescription medications with a small sip of water   as early as possible the morning of your procedure.  Diabetic patients - see separate instructions.  Stop taking Plavix or Aggrenox on  You will be contacted by our office prior to your procedure for directions on holding your Plavix.  If you do not hear from our office 1 week prior to your scheduled procedure, please call [phone] to discuss.  (7 days before procedure).            OTHER INSTRUCTIONS  You will need a responsible adult at least 60 years of age to accompany you and drive you home.   This person must remain in the waiting room during your procedure.  Wear loose fitting clothing that is easily removed.  Leave jewelry and other valuables at home.  However, you may wish to bring a book to read or  an iPod/MP3 player to listen to music as you wait for your procedure to start.  Remove all body piercing jewelry and leave at home.  Total time from sign-in until discharge is approximately 2-3  hours.  You should go home directly after your procedure and rest.  You can resume normal activities the  day after your procedure.  The day of your procedure you should not:   Drive   Make legal decisions   Operate machinery   Drink alcohol   Return to work  You will receive specific instructions about eating, activities and medications before you leave.    The above instructions have been reviewed and explained to me by   _______________________    I fully understand and can verbalize these instructions _____________________________ Date _________

## 2010-03-28 ENCOUNTER — Encounter: Payer: Medicare Other | Attending: Physical Medicine & Rehabilitation

## 2010-03-28 ENCOUNTER — Ambulatory Visit (HOSPITAL_BASED_OUTPATIENT_CLINIC_OR_DEPARTMENT_OTHER): Payer: Medicare Other | Admitting: Physical Medicine & Rehabilitation

## 2010-03-28 DIAGNOSIS — M7520 Bicipital tendinitis, unspecified shoulder: Secondary | ICD-10-CM | POA: Insufficient documentation

## 2010-03-28 DIAGNOSIS — M719 Bursopathy, unspecified: Secondary | ICD-10-CM | POA: Insufficient documentation

## 2010-03-28 DIAGNOSIS — M459 Ankylosing spondylitis of unspecified sites in spine: Secondary | ICD-10-CM

## 2010-03-28 DIAGNOSIS — M67919 Unspecified disorder of synovium and tendon, unspecified shoulder: Secondary | ICD-10-CM | POA: Insufficient documentation

## 2010-03-28 DIAGNOSIS — M7530 Calcific tendinitis of unspecified shoulder: Secondary | ICD-10-CM

## 2010-03-28 DIAGNOSIS — E291 Testicular hypofunction: Secondary | ICD-10-CM

## 2010-03-28 DIAGNOSIS — M76899 Other specified enthesopathies of unspecified lower limb, excluding foot: Secondary | ICD-10-CM | POA: Insufficient documentation

## 2010-03-28 DIAGNOSIS — M659 Synovitis and tenosynovitis, unspecified: Secondary | ICD-10-CM

## 2010-04-17 LAB — GLUCOSE, CAPILLARY

## 2010-06-17 NOTE — Assessment & Plan Note (Signed)
Daniel Rivers is back regarding his back, hip, and shoulder pain.  He has been  exercising more and working on stretching etc.  He had been using less  breakthrough Norco.  On days where he is less active, he will take may  be 2 or days he is more active he will take 3 or 4.  He has been walking  a lot, going to flea markets, etc., which has helped in his opinion.  He  rates his pain at 3-4/10, describes as sharp, burning, stabbing, aching,  and constant.  Pain interferes with general activity, relationship with  others, and enjoyment of life on a moderate level.  Sleep is fair.   REVIEW OF SYSTEMS:  Notable for the above as well as some occasional  easy bleeding, high sugars.  He is having a bowel movement every day  with his MiraLax essentially.  Thus, he reports some occasional urine  retention.  Other pertinent positives are above and full review is in  the written health and history section.   SOCIAL HISTORY:  Unchanged.   PHYSICAL EXAMINATION:  VITAL SIGNS:  Blood pressure is 147/65, pulse is  89, respiratory rate 18, and he is saturating 98% on room air.  GENERAL:  The patient is pleasant, alert and oriented x3.  He uses cane  for support but generally shifts his weight nicely.  Continues to be  limited with flexion and extension in the low back and neck without  significant change today.  Strength is 5/5 and sensory exam is grossly  intact.  He does have some inhibition proximally due to shoulder pain in  the upper extremities.  HEART:  Regular.  CHEST:  Clear.  ABDOMEN:  Soft and nontender.   ASSESSMENT AND PLAN:  1. 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  2. Obesity.  3. Left greater trochanter bursitis.  4. Bilateral rotator cuff syndrome/osteoarthritis.  5. Coronary artery disease.  6. Left bicipital tendinitis.   PLAN:  1. The patient seems to be doing quite nicely at this point.      Encouraged ongoing exercises and activity.  We refilled his 55-month  prescriptions of Norco and Morphine.  I also wrote the MiraLax      today.  2. I will see him back in about 3 months' time.      Daniel Rivers, M.D.  Electronically Signed     ZTS/MedQ  D:  10/31/2007 10:13:12  T:  11/01/2007 00:16:29  Job #:  [id number]   cc:   Daniel Rivers, M.D.  Fax: [phone]

## 2010-06-17 NOTE — Assessment & Plan Note (Signed)
HISTORY:  Daniel Rivers is back regarding his shoulder and back issues.  He  has actually been doing fairly well for the last few months.  His pain  is 2/10 right now.  He describes it as intermittent aching and sometimes  stabbing and sharp.  The pain is generally in the back and neck and  occasionally into the left leg.  He remains on Avinza and Vicodin for  break-through pain.  Sleep is fair.  He stays active.  He does note some  shortness of breath and occasional wheezing associated with bending and  working in the yard.  He does not notice it as much in standing and  walking.   REVIEW OF SYSTEMS:  Pertinent issues are mentioned above.  A full review  is in the health and history section.   SOCIAL HISTORY:  Is without change.   PHYSICAL EXAMINATION:  VITAL SIGNS:  Blood pressure 112/66, pulse 90,  respirations 16, saturation 96% on room air.  GENERAL:  The patient is pleasant, alert and oriented x3.  Affect is  bright and appropriate.  NEUROLOGIC/MUSCULOSKELETAL:  Strength is 5/5.  Reflexes are 2+.  Shoulder range is restricted to about 70 degrees of shoulder abduction  still.  Back remains stable with 20-30 degrees of flexion.  Back  extension is about 10 degrees.  HEART:  Regular.  CHEST:  Clear.  ABDOMEN:  Soft, nontender.   ASSESSMENT:  1. 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  2. Obesity.  3. Left greater trochanter bursitis.  4. Bilateral rotator cuff syndrome and osteophytes.  5. History of left bicipital tendinitis.  6. Recent shortness of breath with bending.  Recent upper respiratory      tract infection.   PLAN:  1. Recommend followup with Dr. Juline Patch regarding his symptoms      over the next few weeks if these persist.  He has a strong history      of cardiac disease.  It would be worthwhile to initiate a workup      for this alone.  2. Continue Avinza at 90 mg q.12h, #60.  3. Continue with Norco 7.5 mg, one q.6h. p.r.n.  4. Continue range  of motion and exercises at home as tolerated.  5. I will see him back in three months and in the nurse clinic in one      month.      [name], M.D.  Electronically Signed     ZTS/MedQ  D:  06/23/2006 12:12:05  T:  06/23/2006 13:14:30  Job #:  [id number]   cc:   Juline Patch, M.D.  Fax: [phone]

## 2010-06-17 NOTE — Assessment & Plan Note (Signed)
Daniel Rivers is back regarding his chronic pain.  Pain control has been fine.  His major issue is that his insurance is changing over to a Medicare  plan and he cannot afford the Avinza on this plan.  He wanted to discuss  other options today.  He rates his pain a 3 to 4 out of 10 and describes  it as intermittent, dull, constant tingling, aching.  He states that the  pain interferes with his general activity, relations with others and  enjoyment of life on a mild to moderate level.  Daniel Rivers stays active  with activities around the house.  He is building a fence slowly and  generally does well when he keeps things at his own pace.   REVIEW OF SYSTEMS:  Is notable for occasional night sweats and high  sugars.  Otherwise, he is stable.   SOCIAL HISTORY:  Without change.   PHYSICAL EXAM:  Blood pressure 116/84, pulse is 98, respiratory rate 18,  he is sating 98% on room air.  The patient is pleasant, alert and  oriented x3.  Affect is bright and appropriate.  Gait is generally  stable, using his cane for support.  Back remains limited with lumbar  flexion.  He has some pain and restriction with abduction and rotation  of both shoulders.  Cognitively he is intact.  Cranial nerve exam is  normal.  Motor and sensory function within normal limits as well.  HEART:  Regular.  CHEST:  Clear.  ABDOMEN:  Soft, nontender.   ASSESSMENT:  1. 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  2. Obesity.  3. Left trochanter bursitis.  4. Bilateral rotator cuff syndrome with osteophytes.  5. History of left bicipital tendinitis.  6. Questionable sleep apnea.   PLAN:  1. Will change his Avinza to morphine controlled release 60 mg q.8      hours.  I think this will most closely replicate his Avinza      schedule and dose.  2. I wrote Norco 7.5/325, three month prescription, #360.  3. Continue with exercise and range of motion to tolerance at home as      well as appropriate diet.  4. I will  see him back in 1 month to discuss his change and adaptation      to the current medication.      [name], M.D.  Electronically Signed     ZTS/MedQ  D:  12/17/2006 11:52:39  T:  12/17/2006 22:36:26  Job #:  [id number]   cc:   Juline Patch, M.D.  Fax: [phone]

## 2010-06-17 NOTE — Cardiovascular Report (Signed)
NAME:  Daniel Rivers, Daniel Rivers NO.:  [id number]   MEDICAL RECORD NO.:  [id number]          PATIENT TYPE:  INP   LOCATION:  6532                         FACILITY:  [facility]   PHYSICIAN:  Darlin Priestly, MD  DATE OF BIRTH:  [birth year]   DATE OF PROCEDURE:  03/17/2007  DATE OF DISCHARGE:  03/18/2007                            CARDIAC CATHETERIZATION   PROCEDURES:  1. Coronary angiography.  2. LAD - mid.      a.     Percutaneous transluminal balloon angioplasty.      b.     Placement of intracoronary stent.  3. RCA - mid      a.     Percutaneous transluminal balloon angioplasty.      b.     Placement of intracoronary stent x2.[name]   COMPLICATIONS:  None.   INDICATIONS:  Daniel Rivers is a 60 year old male patient of Dr. Ricki Miller with  a history hemochromatosis, ankylosing spondylitis who underwent a  Cardiolite scan December revealing inferior wall thinning with a  suggestion of inferior wall ischemia, normal EF.  He underwent cardiac  catheterization as an outpatient revealing 95% mid-LAD lesion with a  heavily calcified vessel as well as 99% mid right lesion.  He was begun  on aspirin, Plavix and beta-blockers and is now brought for percutaneous  intervention of the LAD and RCA.   DESCRIPTION OF PROCEDURE:  After giving informed consent, the patient  was brought to cardiac cath lab where right groin shaved, prepped and  draped in usual sterile fashion.  Monitors were established.  Using  modified Seldinger technique, a 6-French arterial sheath inserted into  right femoral artery.  Next, a #6-French JL-5 guiding catheter was then  engaged in the left coronary ostium and left angiography performed.  This did confirm a heavily calcified vessel with 95% mid-LAD lesion.  Following this a 0.014 Forte marker wire was inserted in the guiding  catheter positioned distal LAD without difficulty.  Over this wire a 2.5  x 15 mm fire Star balloon was positioned across the stenotic lesion.  Two inflations to a maximum 14 atmospheres performed for total of 38  seconds.  Follow-up angiogram revealed good luminal gain.  This balloon  was then exchanged for a driver 1.61 x 09-UE stent which was positioned  in the mid-LAD with careful attention not extend this beyond the ostium  of the second diagonal.  The stent was deployed to 15 atmospheres for a  total of 18 seconds.  Follow-up angiogram revealed good luminal gain.  A  second inflation to 15 atmospheres performed for total of 10 seconds.  This balloon was then exchanged for a dura Star 3.0 x 10 mL balloon  which was then positioned in the LAD stent.  Two overlapping inflations  to 16 atmospheres performed for a total of 29 seconds.  Follow-up  angiogram revealed good luminal gain with no evidence of dissection of  thrombus with TIMI III flow to the distal vessel.  At this point, we  elected to turn our attention to the RCA.  The JL-5 guide was exchanged  for a  JR-4 guiding catheter with side holes.  This was then coaxially  engaged in the right coronary ostium and angiogram was performed.  Again  this confirmed a long sequential 95% mid RCA stenosis with moderate  calcification.  The same 0.014 Forte guidewire was then used to cross  the mid RCA occlusion position of the distal PLA without difficulty.  We  then attempted to primary stent this vessel using 3-0 x 30 mm driver  stent.  However, this was unable to cross into the midportion vessel.  We then returned to the fire Star 2.5 x 15-15 balloon and were able to  do overlapping inflations through the midportion of the vessel to  maximum approximately 14 atmospheres.  We then attempted to cross with  the driver.  However, this was again unsuccessful hanging in the  midportion of the vessel.  We placed a 0.014 high-torque floppy  guidewire into the distal RCA using a buddy wire and were able to  successfully pass the driver into the midportion of the RCA.  Once the  stent was  across the stenotic lesion.  The body wire was then removed.  The stent was then deployed to 12 atmospheres for a total of 15 seconds.  A second inflation was 16 atmospheres performed for 14 seconds.  This  balloon was then removed and a fire Star 3.5 x 15-mm stent was then  positioned in distal portion of the stent and multiple overlapping  inflations to maximum 14 atmospheres performed throughout the distal mid  and proximal stent for a total of approximately one minute.  Follow-up  angiogram revealed excellent luminal gain though there was an area that  appeared to be a shelf of the proximal end of the stent.  A second 3.5 x  12-mm driver was then positioned overlapping the previously placed  stent.  This stent was then deployed to 12 atmospheres for a total of 5  seconds.  Two additional inflations to maximum 12 atmospheres were  performed for a total of approximately 45 seconds.  Follow-up angiogram  revealed good luminal gain with no evidence of dissection or thrombus.  IV Integrilin was used throughout the case.  Intravenous dose of Heparin  given to maintain ACT between 200-300.   Final orthogonal angiograms reveal less than 10% residual stenosis in  the mid LAD as well as less than 10% residual stenosis in the mid RCA.  At this point, we would like to conclude the procedure.  All balloons,  wires and caths removed.  Hemostatic sheath was sewn in place and the  patient transferred back to the ward in stable condition.   CONCLUSION:  1. Successful percutaneous transluminal balloon angioplasty and      placement of a driver 1.61 x 18 mm stent in the mid-LAD stenotic      lesion ultimate postdilated 3 mm.  2. Successful percutaneous transluminal angioplasty and placement of      overlapping driver stents in the mid and proximal RCA (3.0 x 30      mid, 30.5 x 12 proximal) ultimate postdilated to 3.6 mm.  3. Adjuvant use of Integrilin infusion.      Darlin Priestly, MD   Electronically Signed     RHM/MEDQ  D:  03/17/2007  T:  03/18/2007  Job:  [id number]   cc:   Juline Patch, M.D.

## 2010-06-17 NOTE — Assessment & Plan Note (Signed)
Daniel Rivers is back regarding his shoulder and back.  The pain has been  actually improved since I last saw him.  He is using  a rare Norco for  breakthrough pain.  He uses Avinza for chronic control. He is going for  a sleep study tonight, as he was found to be having some desaturation on  pulse oxymetry. His wife is in the hospital sick with pneumonia, which  has been stressful upon him.  He rates his pain an average of 4 out of  10, describes it as dull, stabbing, sharp, burning, constant aching.  Sleep is poor.  He often feels that he wakes up tired.   REVIEW OF SYSTEMS:  The patient reports numbness, tingling, night  sweats, high sugars, skin rash.  Other pertinent positives listed above  and full review in the written health and history section.   SOCIAL HISTORY:  As mentioned above.   PHYSICAL EXAMINATION:  Blood pressure 131/62, pulse is 86, respiratory  18.  He is sating on 99% on room air.  The patient is pleasant, alert and oriented x3.  Affect is bright and  appropriate.  He walks with a cane and is stable.  HEART:  Regular.  CHEST:  Clear.  ABDOMEN:  Soft, nontender.  Weight is stable.  Back range of motion is without significant change with 20 to 30 degrees  of flexion. Both shoulders were restricted in abduction to about 70 to  80 degrees.  Cognitively patient is appropriate.  Cranial nerve exam in  intact.   ASSESSMENT:  1. 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  2. Obesity.  3. Left greater trochanter bursitis.  4. Bilateral rotator cuff syndrome with osteophytes.  5. History of left bicipital tendinitis.  6. Questionable sleep apnea with study pending tonight.   PLAN:  1. Continue Avinza 90 mg q. 12 hours. Second month prescription was      written as well.  We use Norco 7.5 per breakthrough pain which he      is decreasing in frequency.  2. Continue range of motion exercising as possible.  3. I am anxious to see the results of the sleep study, as  this could      positively effect his overall pain control as well.  4. I will see Juda back in 4 months with a nurse clinic visit in 2      months.      [name], M.D.  Electronically Signed     ZTS/MedQ  D:  08/24/2006 11:27:21  T:  08/24/2006 15:17:34  Job #:  [id number]   cc:   Juline Patch, M.D.  Fax: [phone]

## 2010-06-17 NOTE — Discharge Summary (Signed)
NAME:  Daniel Rivers, Daniel Rivers NO.:  [id number]   MEDICAL RECORD NO.:  [id number]          PATIENT TYPE:  INP   LOCATION:  6532                         FACILITY:  [facility]   PHYSICIAN:  Daniel Priestly, MD  DATE OF BIRTH:  Jul 15, 1950   DATE OF ADMISSION:  03/17/2007  DATE OF DISCHARGE:  03/18/2007                               DISCHARGE SUMMARY   DISCHARGE DIAGNOSES:  1. Abnormal stress test.  2. Coronary artery disease with 95% stenosis in the right coronary      artery, 95% in the left anterior descending artery, undergoing      percutaneous transluminal coronary angioplasty and stent      deployment, two to the right coronary artery and one to the left      anterior descending artery.  3. Diabetes mellitus type 2.  4. Hypertension, controlled.  5. Chronic pain with anklyosing spondylitis.  6. Dyslipidemia.  7. History of hemochromatosis.   DISCHARGE CONDITION:  Stable.   PROCEDURES:  1. Combined left heart cath by Dr. Lenise Rivers on March 17, 2007.  2. PTCA and stent deployments to the RCA and LAD by Dr. Lenise Rivers.   HISTORY OF PRESENT ILLNESS:  Dr. Ricki Rivers referred the patient to Dr.  Jenne Rivers secondary to abnormal stress test, done because he is diabetic  and on high-dose morphine, and there was concern he could have silent  ischemia with his risk factors.  There was some inferior wall ischemia  with normal EF on the stress test, Cardiolite.  He was seen by Dr.  Jenne Rivers and arranged for outpatient cardiac catheterization for further  evaluation.   DISCHARGE MEDICATIONS:  1. No Metformin until Sunday, March 20, 2007, then resume as usual      on 500 mg twice daily.  2. Avapro 150 mg daily.  3. Aspirin 325 mg daily until you see Dr. Jenne Rivers.  4. Morphine ER 60 mg every 8 hours.  5. Hydrocodone 7.5/325 every 6 hours p.r.n.  6. Zolpidem 10 mg nightly.  7. Plavix 75 mg daily.  Do not stop it because of heart attack.  8. Coreg 3.125 mg  twice daily.  9. Prilosec 20 mg daily, over the counter.  10.Lipitor 20 mg every evening.   DISCHARGE INSTRUCTIONS:  1. Increase activity slowly.  No lifting for two days, no driving for      two days.  No sexual activity for two days.  2. A low sodium, heart-healthy diabetic diet.  3. Wash right groin cath site with soap and water.  Call us if any      bleeding, swelling, or drainage.  4. Follow up with Dr. Jenne Rivers on April 06, 2007 at 12 noon.  5. Follow up with Dr. Ricki Rivers as previously instructed.   FAMILY/SOCIAL HISTORY:  See H&P.   REVIEW OF SYSTEMS:  See H&P.   PHYSICAL EXAMINATION AT DISCHARGE:  Blood pressure 116/57, pulse 81,  respiratory rate 20, temp 98.9, oxygen saturation on room air 96%.  HEART:  Regular rate and rhythm.  LUNGS:  Clear.  ABDOMEN:  Soft and nontender.  EXTREMITIES:  Groin stable.  It did bleed the night after procedure with  sheaths being pulled, but it looked good and was able to ambulate the  day of discharge.   LABORATORY DATA:  Hemoglobin 14, hematocrit 40, platelets slightly down  at 136, WBC 5.9, sodium 136, potassium 4.2, BUN 10, creatinine 0.77, and  glucose 133.  On his pre-procedure labs, he was stable.  No acute  changes.  UA was clear.  Previously, LDL is greater than 100, although  he was placed on statin here in the hospital.  His LDL goal should be  70.   Please note as well prior to the procedure at [facility] on  March 09, 2007, Daniel Rivers did undergo a diagnostic cardiac cath at  [facility] and was found to have a significant  coronary disease, then he was set up for the intervention.  His EF at  that time was 60%.   Patient did well and will follow up as an outpatient.      Daniel Rivers      Daniel Priestly, MD  Electronically Signed    LRI/MEDQ  D:  03/18/2007  T:  03/19/2007  Job:  [id number]   cc:   Daniel Rivers, M.D.

## 2010-06-17 NOTE — Assessment & Plan Note (Signed)
Daniel Rivers is back regarding his chronic pain. We switched him over to  morphine controlled release 60 mg every 8 hours at his last visit to  compensate for his change in his insurance. He is doing fairly well. He  states that he does have to stay on time with this or he starts to have  some withdrawal-type symptoms. He is using a bit more Norco too.  Otherwise, his activity has been stable. His pain is a 5 out of 10 on  average. Pain is sharp, burning, intermittent, constant, and aching.  Sleep is fair. Poor worsens with activities such as walking, bending,  standing, and sitting.   REVIEW OF SYSTEMS:  Notable for the above. Full review is in the written  health and history section in the chart.   SOCIAL HISTORY:  Same.   PHYSICAL EXAMINATION:  VITAL SIGNS:  Blood pressure 125/64, pulse 96,  respiratory rate 18, saturation 99% on room air.  GENERAL:  The patient is pleasant. He is awake, alert, and oriented x3.  He uses a cane still for support.  BACK:  Rigid and especially inhibited with lumbar flexion.  SHOULDERS:  Painful with adduction and rotation bilaterally.  NEUROLOGIC:  Cognitively, he is appropriate with a normal cranial nerve  exam. Muscle and sensory exam are within functional limits.  HEART:  Regular.  CHEST:  Clear.  ABDOMEN:  Soft and nontender.   ASSESSMENT:  1. 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  2. Obesity.  3. Left trochanteric bursitis.  4. Bilateral rotator cuff syndrome/osteoarthritis.  5. Left bicycle tendinitis.  6. Questionable sleep apnea.   PLAN:  1. Continue morphine controlled release and Norco as written. I gave      him 1 month and 3 months supplies today.  2. We will see him back in 4 months' time. Generally, Shreyansh is doing      fairly well.      [name], M.D.  Electronically Signed     ZTS/MedQ  D:  01/14/2007 12:12:12  T:  01/15/2007 06:04:47  Job #:  [id number]   cc:   Juline Patch, M.D.  Fax: [phone]

## 2010-06-17 NOTE — Assessment & Plan Note (Signed)
Daniel Rivers is back regarding his chronic back and neck pain.  Pain has been  generally stable at 4/10.  He continues with his activities around the  home in the workshop, etc.  Pain is described as sharp, burning, dull,  stabbing, tingling, and aching.  Pain interferes with general activity,  relations with others, enjoyment of life on a moderate level.  Sleep is  fair.   On review of systems, the patient notes no new changes.  Full detailed  review is in the written health and history section of the chart.   SOCIAL HISTORY:  Unchanged.   PHYSICAL EXAMINATION:  Blood pressure is 132/76, pulse is 74,  respiratory rate 18.  He is sating 98% on room air.  The patient is  pleasant, alert and oriented x3.  The patient requires extra time for  flexion and extension of his low back.  Strength is 5/5 with a normal  sensory exam and 1+ reflexes.  Heart is regular.  Chest is clear.  Abdomen is soft, nontender.   ASSESSMENT:  1. 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  2. Left greater trochanter bursitis.  3. Bilateral rotator cuff syndromes/osteoarthritis.  4. Coronary artery disease.  5. Left bicipital tendonitis.   PLAN:  1. Continue with current medication regimen.  The patient seems to      know his limits and tries to stay active as appropriate.  He was      given a 83-month prescription of his MS Contin ER 60 mg 1 q.8 h. and      Norco 7.5 one q.6 h. p.r.n. #270 and 360 respectively.  2. Followup UDS in the fall.      [name], M.D.  Electronically Signed     ZTS/MedQ  D:  08/10/2008 13:49:34  T:  08/11/2008 02:49:16  Job #:  [id number]

## 2010-06-17 NOTE — Assessment & Plan Note (Signed)
Daniel Rivers is back regarding his chronic back and neck pain.  He has been  generally stable with his pain since I last saw him in September.  His  pain ranges from 4-5/10.  His Oswestry score is 52%.  He tries to stay  active and goes to his limits, but tries not to push too far beyond  these so as to avoid exacerbating his pain.  He uses his MS Contin ER 60  mg q.8 h. with Norco 7.5 for breakthrough pain.  Pain is described as  sharp, burning, dull, constant, tingling, and aching.  Pain interferes  with his general activity, relationship with others, enjoyment of life  on a moderate level.  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   Pain worsens generally with activity, improves with rest, heat,  medications, and pacing.   REVIEW OF SYSTEMS:  Notable for the above.  Full review is in the  written health and history section of the chart.   SOCIAL HISTORY:  Unchanged.   PHYSICAL EXAMINATION:  VITAL SIGNS:  Blood pressure is 122/66, pulse is  84, and respiratory rate 18.  He is sating 98% on room air.  GENERAL:  The patient is a pleasant, alert, and oriented x3.  He uses a  cane for support.  He bent for me today at the waist and had  approximately 80 degrees of flexion.  Extension was 10 degrees and  little more limited.  Rotation and lateral bending were about 15 to 20  degrees either side at the waist.  Cervical range was much more limited  with his basal resting position at 5 degrees forward flexion.  He is  able move another 5 to 10 beyond that.  He is limited rotating to 10 or  15 degrees either way.  HEART:  Regular.  CHEST:  Clear.  ABDOMEN:  Soft and nontender.  EXTREMITIES:  His strength is 5/5 and normal sensory exam.   ASSESSMENT:  1. 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  2. Left greater trochanteric bursitis.  3. Bilateral rotator cuff syndrome/osteoarthritis.  4. Coronary artery disease.  5. Left  bicipital tendinitis.   PLAN:  1. Maintain medications at current level noted above.  He needs to      stay on top of his bowels as well.  Daniel Rivers seems to have a good      ideaa as far as his activity tolerance and his pacing is concerned.      Encouraged activities as able going forward.  If he does not      continue with range of motion and exercise, he will move      substantial      function on my estimation.  2. We will see him back in 3 months' time.  The patient was given 3      months prescriptions today.      [name], M.D.  Electronically Signed     ZTS/MedQ  D:  01/20/2008 10:14:01  T:  01/21/2008 03:15:04  Job #:  [id number]   cc:   Juline Patch, M.D.  Fax: [phone]

## 2010-06-17 NOTE — Assessment & Plan Note (Signed)
Daniel Rivers is back regarding his chronic neck and low back pain.  He has  been generally stable with pain at 4-5/10.  His Oswestry score is 52%  today.  He describes pain is sharp, dull, stabbing, intermittent,  constant, and aching.  Pain interferes with general activity, relations  with others, and enjoyment of life on a mild-to-moderate level.  Sleep  is fair.   REVIEW OF SYSTEMS:  Notable for tingling, dizziness, depression, night  sweats, constipation, skin rash, breakdown etc.  Full review is in the  written health and history section and other pertinent positives are  above.   SOCIAL HISTORY:  Unchanged.  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   PHYSICAL EXAMINATION:  VITAL SIGNS:  Blood pressure is 126/72, pulse is  91, respiratory rate 18.  He is sating 97% on room air.  Weight is  stable.  GENERAL:  The patient is pleasant, alert and oriented x3.  MUSCULOSKELETAL:  He has limited flexion and extension of the back and  neck today.  Strength remains generally 5/5 with his normal sensory  function for the most part.  HEART:  Regular.  CHEST:  Clear.  ABDOMEN:  Soft, nontender.   ASSESSMENT:  1. Ankylosing spondylitis of cervical lumbar spine.  2. Left greater trochanter bursitis.  3. Bilateral rotator cuff syndromes/osteoarthritis.  4. Coronary artery disease.  5. Left bicipital tendinitis.   PLAN:  No changes were made to medications today.  He is doing well at  this point and seems to know his limitations and stays active to a  reasonable extend as well.  I gave him a 2-month prescription for his MS  Contin ER 1 q.8 h as well as his Norco 7.5 one q.6 h p.r.n., as he saves  substantial money this way.  We will check a UDS later this year.      [name], M.D.  Electronically Signed     ZTS/MedQ  D:  04/11/2008 11:55:28  T:  04/12/2008 00:07:48  Job #:  [id number]

## 2010-06-17 NOTE — Assessment & Plan Note (Signed)
HISTORY:  Mr. Lucena is back regarding his back, neck, and shoulder  pain.  He has been doing very well over the last few months.  Pain  ranges from a 3-5/10.  He has had no further cardiac difficulties.  He  tries to stay active, but knows his limits.  He states he is bending and  stretching.  Pain interferes with his general activity, relationship  with others, and enjoyment of life on a mild-to-moderate level.  Sleep  is good.   REVIEW OF SYSTEMS:  Notable for the above.  Full review has been written  out in the history section.   SOCIAL HISTORY:  Unchanged.   PHYSICAL EXAMINATION:  VITAL SIGNS:  Blood pressure is 135/70, pulse is  90, respiratory rate 20, and sating 98% on room air.  GENERAL:  The patient is pleasant, alert, and oriented x3.  He walks  with a cane for support, but stable.  BACK:  Limited with flexion/extension today, but no change is seen.  NEUROLOGIC:  Cranial nerve exam is intact.  Cognitively, he is within  normal limits.  HEART:  Regular.  CHEST:  Clear.  WEIGHT:  Stable.   ASSESSMENT:  1. 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  2. Obesity.  3. Left greater trochanteric bursitis.  4. Bilateral rotator cuff syndrome/osteoarthritis.  5. Coronary artery disease.  6. Left bicipital tendinitis.   PLAN:  Continue 42-month prescriptions of Morphine and Norco.  I will see  him back in 3 months' time for followup.  He is generally doing fairly  well.      [name], M.D.  Electronically Signed     ZTS/MedQ  D:  08/03/2007 11:31:14  T:  08/04/2007 05:37:31  Job #:  [id number]   cc:   Juline Patch, M.D.  Fax: [phone]

## 2010-06-17 NOTE — Assessment & Plan Note (Signed)
Mr. Machuca is back regarding his chronic pain. His biggest issue since I  last him is that he had 3 cardiac stents placed in February. He is doing  fine with these now. He is now on Plavix, Coreg, and Lipitor as cardiac  prevention. His pain is a 4/10, and in general he is stable. He is  getting his MS Contin through mail-in prescription. He uses Norco for  breakthrough pain and is variable with the use of this. He is working on  his weight and activity, especially considering the cardiac issues  above.   REVIEW OF SYSTEMS:  Notable for the above. Full review is in the written  health and history section of the chart.   SOCIAL HISTORY:  Unchanged.   PHYSICAL EXAMINATION:  VITAL SIGNS: Blood pressure is 124/73. Pulse was  88. Respiratory rate 18. Satting 98% on room air.  GENERAL:  The patient is pleasant, alert and oriented x3. He walks with  his cane and is generally stable.  MUSCULOSKELETAL:  Back rigid to flexion and extension today as are the  shoulders.  NEUROLOGIC:  Normal cranial nerve exam. Motor and sensory exams are  within normal limits.  HEART:  Regular rate.  CHEST:  Clear.  ABDOMEN:  Soft, nontender.   ASSESSMENT:  1. Ankylosing spondylitis of the cervical/lumbar spine.  2. Obesity.  3. Left greater trochanter bursitis.  4. Bilateral rotator cuff syndrome/osteoarthritis.  5. Coronary artery disease.  6. Left bicipital tendinitis.   PLAN:  1. Continue morphine controlled release for baseline pain control . I      gave him a 54-month and a 81-month prescription of the morphine to      allow him enough leeway to receive his meds by mail without having      a gap. Continue Norco for breakthrough pain.  2. I will see the patient back in 3 months' time.      [name], M.D.  Electronically Signed     ZTS/MedQ  D:  05/04/2007 11:51:06  T:  05/04/2007 12:24:05  Job #:  [id number]   cc:   Juline Patch, M.D.  Fax: [phone]

## 2010-06-20 ENCOUNTER — Encounter: Payer: Medicare Other | Attending: Physical Medicine & Rehabilitation | Admitting: Physical Medicine & Rehabilitation

## 2010-06-20 DIAGNOSIS — M459 Ankylosing spondylitis of unspecified sites in spine: Secondary | ICD-10-CM | POA: Insufficient documentation

## 2010-06-20 DIAGNOSIS — M79609 Pain in unspecified limb: Secondary | ICD-10-CM | POA: Insufficient documentation

## 2010-06-20 DIAGNOSIS — M7520 Bicipital tendinitis, unspecified shoulder: Secondary | ICD-10-CM | POA: Insufficient documentation

## 2010-06-20 DIAGNOSIS — M7530 Calcific tendinitis of unspecified shoulder: Secondary | ICD-10-CM

## 2010-06-20 DIAGNOSIS — M545 Low back pain, unspecified: Secondary | ICD-10-CM | POA: Insufficient documentation

## 2010-06-20 DIAGNOSIS — M76899 Other specified enthesopathies of unspecified lower limb, excluding foot: Secondary | ICD-10-CM | POA: Insufficient documentation

## 2010-06-20 DIAGNOSIS — M719 Bursopathy, unspecified: Secondary | ICD-10-CM | POA: Insufficient documentation

## 2010-06-20 DIAGNOSIS — E291 Testicular hypofunction: Secondary | ICD-10-CM

## 2010-06-20 DIAGNOSIS — M659 Synovitis and tenosynovitis, unspecified: Secondary | ICD-10-CM

## 2010-06-20 DIAGNOSIS — M67919 Unspecified disorder of synovium and tendon, unspecified shoulder: Secondary | ICD-10-CM | POA: Insufficient documentation

## 2010-06-20 NOTE — Assessment & Plan Note (Signed)
Daniel Rivers is back regarding his anklyosing spondylitis and shoulder  problems.  He has left shoulder arthroscopic surgery in December.  He  has done fairly well.  He has had some scar tissue buildup and still  working on his range of motion.  He is restart physical therapy on  Monday.  He had received a steroid injection from Dr. Renae Fickle, Monday.  His  pain is at a 5-6/10.  He was questioning whether he might have something  a bit stronger for breakthrough pain.  He remained on Avinza 90 mg q.12  h. and Vicodin 5/500, 1-2 q.6 h. p.r.n.  Patient states his pain is  sharp burning, intermittent, tingling, aching.  It interferes with  general activity, relations with others and enjoyment  life on a  moderate level.   REVIEW OF SYSTEMS:  Positive for numbness, tingling, depression,  anxiety.  Other pertinent problems as listed above and fully reviewed in  medical history section.   SOCIAL HISTORY:  Patient is without significant changes today.   PHYSICAL EXAMINATION:  VITAL SIGNS:  Blood pressure is 143/75, pulse is  82, respiratory rate 16, satting 99% on room air.  GENERAL:  Patient is pleasant, no acute distress.  He is alert and  oriented x3.  Affect is bright and appropriate.  Gait is generally  stable.  He is restricted somewhat still with lumbar flexion and  extension today.  Motor function in both legs is 5/5 and reflexes are  2+.  Left shoulder range is restricted to about 70 degrees of shoulder  abduction.  Rotation externally is approximately 30 degrees.  Right  shoulder movement is functional and within normal range.  Heart was  regular, chest clear.  ABDOMEN:  Soft, nontender.   ASSESSMENT:  1. 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  2. Obesity.  3. Left greater trochanter bursitis.  4. Bilateral rotator cuff syndrome and osteophytes.  5. History of left bicipital tendinitis.   PLAN:  1. Avinza 90 mg q.12 h., #60.  2. Change Vicodin to Norco 7.5/325, #120,  1 q.6 h. p.r.n.  3. Continue with therapies.  Encourage heat, ultrasound, etc., but      prior to therapy to work to maximize range.  4. See the patient back in 3 months' time.  See him back in nurse      clinic in one month.      [name], M.D.  Electronically Signed     Daniel Rivers/MedQ  D:  03/31/2006 10:19:06  T:  03/31/2006 11:11:58  Job #:  [id number]

## 2010-06-20 NOTE — Assessment & Plan Note (Signed)
MEDICAL RECORD NUMBER:  Is [id number].   Daniel Rivers is here regarding his ankylosing spondylitis and associated pain.  He has done well with the recent Avinza change.  He is on 90 mg q.12h.,  taking Vicodin two and at most three times a day for breakthrough pain.  He  is limited in walking.  He can walk 200 to 300 yards without having to stop.  He finds that he does worse on concrete surfaces.  He remained limited in  bending and sitting.  His most comfortable position, is laying on his side  in bed.  Generally, his sleep has been improved.  His mood is fair.  He  rates his pain on average as a 5 out of 10, but it will range from a 2 to 8  out of 10.  He tried an Warehouse manager unit with some benefit but insurance  would not pay for this, so he had to send the unit back.   REVIEW OF SYSTEMS:  The patient denies any chest pain, shortness of breath,  cold, flu, wheezing, or coughing symptoms.  Denies weakness, spasm, stroke,  vertigo, problems with anxiety, suicidal thoughts, agitation, or headaches.  He does report some numbness as well as depression, constipation, and  urinary retention.  He denies nausea, vomiting, reflux, problems with  urination, appetite.  No weight changes, skin breakdown, or sweating noted.   PHYSICAL EXAMINATION:  VITAL SIGNS:  Blood pressure 136/86, pulse is 92,  respiratory rate is 18, sating 95% on room air.  MUSCULOSKELETAL:  The patient walks with a slightly wide based gait and is  limited in lumbar flexion today.  He is able to transfer from sit-to-stand  position without assistance.  GENERAL:  Affect is fairly bright and he is well kept.  NECK:  Cervical range of motion remains limited with 55-60 degrees of full  AP range of motion at the neck.  BACK:  Low back range of motion is 5-10 degrees in all areas.  EXTREMITIES:  Strength remains 5/5 throughout all four extremities today  with minimal give away weakness noted.  Sensory exam is intact.  Pulses are  2+.  NEUROLOGIC:  Cognition is appropriate.  SKIN:  I saw on skin breakdown or color changes.  HEART:  Regular rate and rhythm.  LUNGS:  Clear.   ASSESSMENT:  1.  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  2.  Obesity.  3.  Left greater trochanter bursitis.  4.  Right rotator cuff syndrome.  5.  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   PLAN:  1.  The patient is doing well with the Avinza.  We will keep this at 90 mg      q.12h.  2.  Continue Vicodin for breakthrough pain at 2-3 tablets a day as needed.  3.  The patient will use Lidoderm patches p.r.n. for tender points.  4.  The patient needs to maintain exercise and a bowel regimen.  5.  I will see the patient back in three months' time.       ZTS/MedQ  D:  10/30/2003 16:02:35  T:  10/30/2003 17:31:07  Job #:  [id number]   cc:   Renae Fickle, Dr.

## 2010-06-20 NOTE — Assessment & Plan Note (Signed)
Daniel Rivers is back regarding his low back and extremity pain.  Pain has been generally stable, since I last saw him.  He has been on testosterone which was slightly increased at last visit and does feel that it gives him more energy and stamina.  He has been doing more from an exercise standpoint.  He does report some sweating and some erection related issues.  His pain is about 6/10 currently, described as aching, tingling, sharp, burning.  Sleep is fair.  REVIEW OF SYSTEMS:  Notable for numbness and tingling.  Other pertinent positives as above.  Full review is in the written health and history section.  SOCIAL HISTORY:  The patient is married and situation is unchanged.  PHYSICAL EXAMINATION:  VITAL SIGNS:  Blood pressure is 120/53, pulse 75, respiratory rate 18, satting 96% on room air.  The patient is pleasant, alert.  MUSCULOSKELETAL:  Back range of motion essentially is unchanged with 30-40 degrees of flexion.  Extension caused some relief and pain. Strength is generally 5/5, normal sensory exam.  ASSESSMENT: 1. 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 2. Left greater trochanteric bursitis. 3. Bilateral rotator cuff syndrome. 4. Left bicipital tendonitis.  PLAN: 1. He will continue with testosterone supplementation.  He is willing     to accept some of the side effects given his benefit with     medication.  He sees Urology next month regarding his potential     Peyronie disease. 2. I refilled his morphine 60 mg #270 and Norco 7.5 #270 each 65-month     prescriptions. 3. We will recheck testosterone over the summer.     [name], M.D. Electronically Signed    ZTS/MedQ D:  06/20/2010 10:39:10  T:  06/20/2010 23:06:00  Job #:  [id number]  cc:   Juline Patch, M.D. Fax: [phone]

## 2010-06-20 NOTE — Assessment & Plan Note (Signed)
MEDICAL RECORD NUMBER:  [id number]   [name] is here in follow-up of his ankylosing spondylitis and right  shoulder pain.  He can usually do pretty well with the Avinza at the 90 mg  q.12h dosing.  He rates his pain at a 5 to 6 out of 10 on average.  He feels  that it is very manageable.  The pain is most prominent in the low back as  well as into the left thigh, right shoulder, and neck.  He is using less  breakthrough hydrocodone.  The pain improves with rest and medications,  usually made worse with walking, bending, sitting, and working.  He does  some hobby-type activities at home as far as wood working and walking.  But  he is not very active from an aerobic standpoint.  He does keep up with some  simple back stretches to work on extension and flexion.   SOCIAL HISTORY:  He continues to live with his wife.  He smokes 2-1/2 packs  per day of cigarettes.   REVIEW OF SYSTEMS:  The patient reports occasional headache, blurred vision,  numbness in the pinky and fourth fingers especially after sleep.  He does  not some problems with hearing and agitation, constipation, urinary  retention, sweating, easy bleeding.   PHYSICAL EXAMINATION:  VITAL SIGNS:  Blood pressure 121/61, pulse 90,  respiratory rate 16, saturating 99% on room air.  GENERAL:  The patient walks with a fairly normal gait.  Affect is bright.  Appearance is well-kept.  He does have a head forward posture.  Neck range  of motion is a bit limited in all planes particularly in extension.  Low bow  range of motion is limited in all planes.  He has approximately 15 degrees  of forward lumbar flexion, 5 degrees of extension, and lateral bending 5  degrees on the left and 10 to 15 degrees on the right.  Twisting was in the  5 to 10 degree range bilaterally.  Strength in the upper extremities is 5/5  as were the lower extremities.  He had no focal sensory findings today.  He  did have a positive impingement sign in the right  shoulder.  Tenol's test  was positive at the ulnar groove bilaterally.  The patient is tender with  palpation in the cervical region as well as the lumbar spine.  Left lumbar  paraspinals are tight and there may be a leftward rotation of the spine  there versus sclerosis.  Cranial nerve examination is intact.  Cognitively  the patient is appropriate.   ASSESSMENT:  1.  Ankylosing spondylitis of the C and L-spine.  2.  Obesity which is improving.  3.  Left greater trochanter bursitis.  4.  Right rotator cuff syndrome.   PLAN:  1.  Will continue with Avinza at 90 mg q.12h.  The patient is doing quite      well with this.  2.  The patient will use Vicodin 5/500 two to three times a day as needed      for breakthrough pain.  3.  The patient will continue with Lidoderm patches for tender areas on a      daily p.r.n. basis.  4.  Continue with stretching and exercise as able.  5.  Will have the patient follow up with one of our P.A.'s in our      maintenance clinic in three months' time.      Zach   ZTS/MedQ  D:  02/22/2004  10:12:10  T:  02/22/2004 11:12:07  Job #:  [id number]   cc:   Deidre Ala, M.D.  [address]  [place], [place] [address]  Fax: [phone]   Juline Patch, M.D.  [address]  [place], [place] [address]  Fax: [phone]

## 2010-06-20 NOTE — Assessment & Plan Note (Signed)
MEDICAL RECORD NUMBER:  [id number]   HISTORY OF PRESENT ILLNESS:  Florian is back regarding his low back and neck  pain.  He has maintained his weight.  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  He rates his pain at a 4/10 on average.  Notes no significant changes, but occasionally has some pain in the left  groin and medial thigh.  Overall, describes his pain as sharp, dull,  constant, tingling and aching.  Interferes with general activity,  relationship with others, enjoyment of life on a moderate to significant  level.  Mood has been fair.  He is sleeping fairly well.  On Avinza 90 mg  q.12 h and Vicodin 5/500 one q.8 h p.r.n.  He is averaging three a day,  although, someday's he takes less and some days he takes more than three.  He can work about 1-2 hours at a time in the shop, and then he has to take a  break, typically.   REVIEW OF SYSTEMS:  The patient reports numbness, tingling, some depression.  Weight has been stable.  Denies any GU, GI or respiratory complaints.   SOCIAL HISTORY:  The patient is without significant change today.  He  remains married.   PHYSICAL EXAMINATION:  VITAL SIGNS:  Blood pressure 134/79, pulse 90,  respirations 16, saturating 100% on room air.  GENERAL APPEARANCE:  The patient is pleasant in no acute distress.  Weight  stable.  Back range of motion is limited to 10-20 degrees of flexion, 5-10  degrees of extension and 15 degrees of lateral bending and rotation.  Moves  the neck 10 degrees with flexion/extension plain.  He is able to rotate 20-  30 degrees either side.  Areas remain tight and are fairly tender to  palpation today.  Motor function is 5/5.  Coordination is fair.  Reflexes  are 1+.  Sensory exam is normal.  Cognitively, the patient is appropriate.  Displays normal mood.   ASSESSMENT:  1.  Ankylosing spondylitis with cervical and lumbar spine.  2.  Obesity.  3.  Left greater trochanter bursitis.  4.  History of  right rotator cuff syndrome.   PLAN:  1.  The patient is generally stable.  He has adjusted to his life, although,      he still carries some emotional baggage with this.  Still feel that he      is clinically depressed at this point.  2.  Continue with Avinza 90 mg q.12 h.  Increase his Vicodin to 1-2 q.8 h      p.r.n. #110.  3.  Recommended trial of over-the-counter Omega-3 fatty acids for pain.  4.  I want him to continue with his activity as tolerated.  5.  I will see him back in three months time.  I will see him at the RN      clinic in one month.      [name], M.D.  Electronically Signed     ZTS/MedQ  D:  12/12/2004 09:45:28  T:  12/12/2004 11:25:30  Job #:  [id number]

## 2010-06-20 NOTE — Assessment & Plan Note (Signed)
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  His left hip is doing pretty  well.  He had his right rotator cuff surgery last week by Dr. Renae Fickle.  Sounds  like though he had some bony excision done with removal of fragments as  well.  He will begin therapy next week.  Pain he rates overall in the neck  and low back at 2-8/10.  The average is about 5/10.  He has not been taking  his Avena or Kadian consistently because he has had problems with  constipation, yet usually not taking anything consistently for his bowels  other than Colace 100 mg b.i.d.  Pain improves with rest, ice and his  medication.  He does take Vicodin for breakthrough pain every 8 hours as  needed.  Pain is worse with walking, bending, sitting or working.  He notes  some increase in his neck pain over the last several months.   REVIEW OF SYSTEMS:  The patient denies any chest pain, shortness of breath,  wheezing, coughing.  No numbness, dizziness or spasms.  He says that he  denies anxious or depression.  Sleep is fair.  He does have significant  constipation as noted above.  No diarrhea, nausea or vomiting are noted.  He  denies fever, chills or weight loss.   PHYSICAL EXAMINATION:  VITAL SIGNS:  Blood pressure 124/79, pulse 115,  respirations 18, saturation 90% on room air.  NEUROLOGICAL:  Range of motion remains limited in the lumbar spine, and his  resting posture leaves his cervical spine at about 45 degrees from neutral.  I am able to passively move him back to approximately -30, but he has pain.  There was pain with palpation throughout the spinous processes and  associated paraspinal musculature of the cervical spine today.  Rotator cuff  was not examined today.  Lumbar spine remains palpably tender with minimal  movement noted.  Strength remains 5/5 in the lower extremities.  Left hip  was nontender today.  Sensory exam was intact.   ASSESSMENT:  1. 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  2. Obesity.  3. Left greater trochanteric bursitis.  4. Right rotator cuff syndrome status post arthroscopic surgery.  5. Questionable facet arthropathy in the lumbar spine.   PLAN:  1. The patient will begin his outpatient physical therapy to address his     right shoulder.  He will do well with some postural treatment as well for     the neck.  2. We will eventually switch him back over to Avinza, but he will complete     his Kadian that he has at home.  We will use 30 q.12h. of the Kadian     and/or Avinza 60 mg daily.  We discussed his bowel program, and will try     to start daily Milk of Magnesia at bedtime in addition to daily     Metamucil.  He can use his Dulcolax tabs as needed.  He may need further     titration in the future.  3. He will use his Vicodin for breakthrough pain 5/500 one q.8h. p.r.n.  I     dispensed 60 of these today.  4. See him back in approximately 2 months time.  He will come back in     approximately one month to 6 weeks for medication refills.   Renae Fickle, thank you.     Earna Coder  Ermalene Postin, M.D.   ZTS/MedQ  D:  03/14/2003 09:47:55  T:  03/14/2003 10:18:28  Job #:  [id number]   cc:   Doristine Section, M.D.

## 2010-06-20 NOTE — Assessment & Plan Note (Signed)
Daniel Rivers is back regarding his chronic pain.  He continues to have complaints  of left shoulder pain which we discussed in May.  The left shoulder bothers  him anteriorly with some radiation to the left lateral arm and occasionally  posteriorly.  He also complains of some numbness along the left fourth and  fifth fingers.  He has had surgery on his ulnar groove in the past in  approximately 2003.  Patient rates his pain at a 6-7/10.  He continues on  Avinza 90 mg q.12h., Vicodin 5/500 one to two q.8h. p.r.n.  The back and  knee pain seem to be generally stable today.  Patient describes his pain as  sharp, burning, intermittent, constant, tingling.  He is limited with his  ADLs and activities around the home.  He states that pain affects his  relations with others and enjoyment of life as well.  Sleep is fair.   REVIEW OF SYSTEMS:  Patient reports the above plus occasional constipation,  abdominal pain, labile sugars, and night sweats.  Other pertinent positives  as listed above and full review as in the health and history section.   SOCIAL HISTORY:  Patient is without significant change today.   PHYSICAL EXAMINATION:  VITAL SIGNS:  Blood pressure is 97/57, pulse is 88,  respiratory rate 16.  He is saturating 97% room air.  GENERAL:  Patient is pleasant, no acute distress.  Alert and oriented x3.  Affect is bright and appropriate.  NEUROLOGIC:  Gait is stable.  Cognitively he is appropriate.  Sensation was  not consistently decreased along the left hand today in any way that I could  find.  Reflexes are 2+.  HEART:  Regular rate and rhythm.  LUNGS:  Clear.  ABDOMEN:  Soft, nontender.  EXTREMITIES:  Left shoulder is notable for pain at the left short head and  long head biceps tendon today.  Impingement maneuvers were equivocal.  Patient had pain with Speeds test today.  Patient had a positive Tinel's  sign at the left elbow.   ASSESSMENT:  1.  Ankylosing spondylitis of the cervical  and lumbar spine.  2.  Obesity.  3.  Left greater trochanter bursitis.  4.  History of right rotator cuff syndrome.  5.  Left shoulder pain consistent with bicipital tendinitis of both heads.   PLAN:  1.  After informed consent we injected both bicipital tendon heads with 40      mg of Kenalog and 3 mL of 1% lidocaine.  Patient tolerated these well.      Continue ice over the area and relative rest, gentle stretching.  I will      send him for x-rays of the left shoulder as well today.  2.  Refilled Avinza 90 mg q.12h., #60 and Vicodin one to two q.8h. p.r.n.,      #110.  3.  I will see the patient back in one month's time for nerve conduction      studies of the left upper extremity, rule out new ulnar entrapment.      [name], M.D.  Electronically Signed     ZTS/MedQ  D:  09/02/2005 11:43:30  T:  09/02/2005 12:20:21  Job #:  [id number]

## 2010-06-20 NOTE — Assessment & Plan Note (Signed)
Daniel Rivers is here in follow up of his chronic low back and neck pain.  Pain  has been fairly well controlled on his back and ranges a 3-4/10.  He has had  some increased pain and symptoms in the left shoulder which has previously  been asymptomatic.  He has pain in the axillary region.  Seemed to bother  him somewhat with adduction of the shoulder as well as overhead use.  He  describes the pain as sharp, dull and intermittent and aching with some  tingling into the arm.  The pain interferes with general activity and  quality of life at a moderate to severe level.  Pain is worse with  activities in general, as mentioned above.   REVIEW OF SYSTEMS:  The patient reports numbness, tingling, confusion, night  sweats, labile sugars, constipation, urine retention.  Other pertinent  positives listed above.   SOCIAL HISTORY:  Patient without significant changes today.   PHYSICAL EXAM:  Blood pressure is 136/85, pulse is 82, respiratory rate 16,  sating 100% on room air.  Patient is pleasant, in no acute distress.  He is  alert and oriented x3.  Affect is bright and appropriate.  Gait is stable.  The patient's coordination is fair.  Reflexes are 2+.  Sensation is  generally intact.  HEART:  Is regular rate and rhythm.  LUNGS:  Clear.  ABDOMEN:  Soft, nontender.  LEFT SHOULDER:  Had some pain along the insertion of the pectoralis major  tendon.  Bicipital maneuvers and rotator cuff maneuvers were non-  provocative.  The patient had some pain on palpation of the posterior  shoulder.  Deltoid region was minimally tender today.  Range of motion was  fair.  Right shoulder revealed ongoing impingement signs.  Cognitively, the  patient is appropriate.  No cranial nerve exam findings were found.   ASSESSMENT:  1.  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  2.  Obesity.  3.  Left greater trochanter bursitis.  4.  Right rotator cuff syndrome.  5.  Left shoulder pain consistent with  inflammation of left pectoralis major      tendon.   PLAN:  1.  Advised ice to the left shoulder.  These symptoms seem to be improving      already.  No interventional treatment was recommended.  2.  Continue Avinza 90 mg q.12 hours or Vicodin 1-2 q.8 hours p.r.n., #110.  3.  Will see the patient back in 3 months time and he will see the R.N. in      clinic in 1 month.      [name], M.D.  Electronically Signed     ZTS/MedQ  D:  06/08/2005 11:19:35  T:  06/09/2005 10:24:14  Job #:  [id number]

## 2010-06-20 NOTE — Assessment & Plan Note (Signed)
MEDICAL RECORD NUMBER:  [id number]   DATE OF BIRTH:  [birth year]   DATE OF VISIT:  Jun 20, 2004   INTERVAL HISTORY:  Daniel Rivers is back regarding his ankylosing spondylitis and  right shoulder pain.  His left hip has been bothering him again.  We  injected the hip about a year ago.  Otherwise, his neck and low back have  remained stable ranging at a 6-7/10 in general in intensity.  He describes  pain more as an aching quality than anything else.  It interferes with his  general activity, relations with others and enjoyment of life on a 3-4/10  level.  Sleep is fair to poor.  Pain is worse at night.  It is aggravated  more so with sitting and inactivity and it is better with medication, TENS  and Lidoderm patches as well as his pain pills.  He is fairly satisfied with  where he is in his general pain control.   REVIEW OF SYSTEMS:  Patient reports occasional spasms complication.  Mood  has been good.  Other review of systems items are in the health and history  section of the chart.   SOCIAL HISTORY:  Patient remains married.  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  He takes extra time but does stay fairly active.   PHYSICAL EXAMINATION:  Blood pressure is 136/61, pulse 96, respiratory rate  16, he is saturating 95% in room air.  Patient is pleasant, no acute  distress.  He is alert and oriented x3.  Affect is bright and appropriate.  He does walk with a slight limp favoring the left hip today.  Heart is  regular rate and rhythm.  Lungs are clear.  Abdomen soft, nontender.  Coordination is fair.  Reflexes are 2+.  Sensation is grossly intact.  Patient remains limited with low back and cervical range of motion at 10-15  degrees with flexion-extension at both areas, rotation was 5-10 degrees at  the neck and low back today.  Right shoulder impingement sign is positive.  Patient had pain along the left greater trochanter today with palpation.  Right greater trochanter  was minimally tender.  Pelvic positioning was fair.  Patient has lost some weight.  Skin was intact.  Cognitively patient is  appropriate.  Cranial nerve exam is normal.   ASSESSMENT:  1.  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  2.  Obesity which is improving.  3.  Left greater trochanter bursitis which has flared.  4.  Right rotator cuff syndrome.   PLAN:  1.  After informed consent and sterile preparation we injected the left      greater trochanter using 3 mL of 1% Lidocaine and 40 mg of Kenalog.      Patient tolerated this well.  I advised him to continue with ice and      stretching exercises to the leg as tolerated.  2.  Continue Avinza 90 mg q.12h. (#60).  3.  Vicodin 5/500 two to three tablets daily as needed for breakthrough pain      (#90).  4.  Lidoderm patches per tender areas.  I encouraged him to use these on the      low back as needed.  5.  I will see the patient in followup in approximately 3 months' time.  He      will see our nurses in clinic in 1 month.      ZTS/MedQ  D:  06/20/2004 14:25:37  T:  06/20/2004 22:19:10  Job #:  [id number]   cc:   Juline Patch, M.D.  [address]  [place], [place] [address]  Fax: [phone]

## 2010-06-20 NOTE — Assessment & Plan Note (Signed)
HISTORY OF PRESENT ILLNESS:  Daniel Rivers is back regarding his ankylosing  spondylitis of the neck and lumbar areas.  He has had some increased pain in  his left hip since I last saw him.  His rotator cuff surgery seems to have  gone pretty well, and he is getting fair to good range of motion back in the  shoulder with decreased pain.  He rates his pain overall, however, as a 7/10  on average.  He tries to stay active with some walking on a regular basis.  He feels that from a generalized back standpoint, __________ Avinza 60 mg  daily with Vicodin p.r.n. is working for his pain.  He is having problems  with his bowels and constipation.  He has seen Dr. Ewing Schlein along this regard.  He is using scheduled MiraLax as well as Dulcolax and fiber to help manage  his movements.  If he misses any of the three, he tells me he does not have  a bowel movement that day.   REVIEW OF SYSTEMS:  The patient denies any chest pain, shortness of breath,  wheezing, cold or flu symptoms.  He has had no weakness, numbness,  dizziness, spasm, confusion, anxiety issues.  He denies occasional agitation  and has had hearing loss.  He reports constipation as mentioned above.  He  has had no nausea, vomiting, reflux or diarrhea.   PHYSICAL EXAMINATION:  VITAL SIGNS:  Blood pressure 137/75, pulse 99,  saturating 99% on room air.  NEUROLOGICAL:  Cervical range of motion remains unchanged at 45 degrees in  neutral position with another 20 degrees of forward flexion at best.  Extension and rotation were limited.  He had pain with passive movement back  to -35 degrees today.  Low back areas were generally tender with palpation  with minimal lumbar range of motion today.  Most of his flexion was through  the pelvic and hip regions.  Rotator cuff area was not examined today.  Strength is 5/5 in lower extremities.  Left hip tender at the greater  trochanter today.  Sensory exam remained intact.  Spinous processes were  tender in  the C4-C7 areas.   ASSESSMENT:  1. 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  2. Obesity which is improving.  3. Left greater trochanter bursitis.  4. Right rotator cuff syndrome status post surgery.  5. Questionable facet arthropathy of the lumbar spine.  6. Diabetes.   PLAN:  1. After informed consent, we injected the left hip today with 3 cc of 1%     lidocaine and 40 mg Kenalog at the left greater trochanter.  The patient     tolerated this well.  2. Will continue his NSAID 50 mg daily with Vicodin 5/500 1 q.8h. for     breakthrough pain.  May consider increasing the Avinza, but I hesitate     with the bowel issues he has had lately.  3. The patient will continue with his current bowel regimen for Dr. Ewing Schlein.  4. I reviewed several stretching exercises for the lower extremities to help     strength his tensor fasciae latae muscle     as well as his quadriceps and hip extensors.  5. I will see the patient back in approximately 2 months time.      [name], M.D.   ZTS/MedQ  D:  05/14/2003 09:50:19  T:  05/14/2003 10:39:12  Job #:  [id number]   cc:   Deidre Ala, M.D.  [address]  [place], [place] [address]  Fax: [phone]

## 2010-06-20 NOTE — Assessment & Plan Note (Signed)
HISTORY OF PRESENT ILLNESS:  Daniel Rivers is back regarding his chronic low back  and neck pain.  Pain has generally been stable.  It does tend to increase a  bit in the cold weather.  Pain on average has been a 5/10.  The pain is  described as sharp, burning, intermittent, constant tingling, aching and  dull.  Pain involves the right arm and hand as well as the low back and  somewhat the knees.  The patient states that pain interferes with general  activity, relationship with others and enjoyment of life on a moderate  level.  Sleep is fair.  Pain generally increases with activity.  He tries to  walk on a regular basis.  He has a large hill in his yard which he goes up  and down at least 10 times a day.  He has difficulty walking longer  distances all at once.  He does keep busy with wood working and metal work  at home, in the shop.   REVIEW OF SYSTEMS:  Positive for numbness, tingling, depression which is  being treated.  Full review is in the Health and History section.   SOCIAL HISTORY:  Pertinent positive as listed above.   PHYSICAL EXAMINATION:  VITAL SIGNS:  Blood pressure 140/82, pulse 84,  respirations 16, saturating 99% on room air.  GENERAL APPEARANCE:  The patient is pleasant in no acute distress.  He is  alert and oriented x3.  Affect is bright and appropriate.  EXTREMITIES:  Back range of motion is essentially stable with 10-20 degrees  of forward flexion, 5 degrees of extension and 15-20 degrees of lateral  bending rotation either side.  Neck range of motion he had problems with  flexion/extension which was approximately 10-15 degrees.  Right shoulder was  painful with impingement maneuvers.  Left shoulder was also tight in  general.  Motor function was 5/5.  Sensation is fair to normal.  Coordination is normal.  Cognitively, the patient was appropriate.   ASSESSMENT:  1.  Ankylosis spondylitis involving both cervical and lumbar spine.  2.  Obesity which is improved.  3.   Left greater trochanter bursitis.  4.  History of right rotator cuff syndrome.   PLAN:  1.  No new changes were made to medication today.  Continue Avinza 90 mg      q.12 h and Vicodin 1-2 q.8 h #110.  2.  Continue exercise and activity at home as tolerated.  Would like to see      his walking increased a bit.  3.  I will see him back in three months time.  He will follow up at the [facility]      clinic in one month.      [name], M.D.  Electronically Signed     ZTS/MedQ  D:  03/13/2005 09:47:13  T:  03/13/2005 10:46:41  Job #:  [id number]

## 2010-06-20 NOTE — Op Note (Signed)
NAME:  Daniel Rivers, Daniel Rivers                        ACCOUNT NO.:  [id number]   MEDICAL RECORD NO.:  [id number]                   PATIENT TYPE:  INP   LOCATION:  2550                                 FACILITY:  [facility]   PHYSICIAN:  [name]. Jerl Santos, M.D.             DATE OF BIRTH:  1950/09/02   DATE OF PROCEDURE:  02/09/2002  DATE OF DISCHARGE:                                 OPERATIVE REPORT   PREOPERATIVE DIAGNOSIS:  Left ankle trimalleolar fracture dislocation.   POSTOPERATIVE DIAGNOSIS:  Left ankle trimalleolar fracture dislocation.   OPERATION:  1. Left ankle open reduction and internal fixation.  2. Left ankle incision and drainage fracture wound.   SURGEON:  [name]. Jerl Santos, M.D.   ASSISTANT:  Lindwood Qua, P.A.   ANESTHESIA:  General   INDICATIONS FOR PROCEDURE:  The patient is a 60 year old man who sustained a  fracture dislocation of his left ankle several hours ago.  He was seen in  the emergency room and was noted to have a small open wound on the medial  aspect and a complete fracture dislocation.  He underwent a reduction in the  emergency room and was scheduled for this procedure.  Informed operative  consent was obtained after discussion of possible complications of, reaction  to anesthesia, infection, nonunion, and arthritis.   DESCRIPTION OF PROCEDURE:  The patient was taken to the operating suite  where general anesthetic was applied without difficulty.  He was positioned  supine and prepped and draped in a normal sterile fashion.  After  administration of  preoperative IV antibiotics, a small puncture wound at  the medial malleolus was thoroughly debrided and irrigated with antibiotic  containing solution.  A lateral approach was then taken to the fibula.  He  had extremely comminuted segmental fibular fracture which was shortened  almost a cm.  I exposed this fully and tried to reduce this as well as  possible.  This was then stabilized with a DCT plate.   He had a great deal  of subcutaneous tissue and it was felt that he could probably tolerate this  higher profile plate.  It was also felt necessary in that he had an  extremely comminuted segmental fracture.  I used most of the holes on this  plate.  I first secured the distal two segments and then with some traction  on the leg was able to achieve appropriate length for the tibia and secure  the superior end of the plate to the fibula shaft.  A medial approach was  then taken to the medial malleolus.  Dissection was carried down to fracture  site which was reduced anatomically and stabilized with two partially  threaded cancellus screws.  The syndesmosis was then stressed and was found  actually to be fairly stable.  I still felt that I had fairly poor fixation  into the distal fragment on the fibula, so I placed and  additional screw  into one cortex of the tibia in order to better capture this fragment.  This  basically served as a syndesmotic screw as well.  The posterior malleolar  fracture appeared to reduce fairly well.  I examined the ankle under  fluoroscopy and hardware was felt to be appropriately placed and the  fracture well reduced.  I read all these views myself.  The posterior  malleolar fracture came into good position.  All wounds were again irrigated  with antibiotic containing solution, followed by reapproximation of the  subcutaneous tissue with 0 and 2-0 undyed Vicryl and skin closure with  staples.  Adaptic was applied to the wound followed by dry gauze and a  posterior splint of plaster with the ankle in neutral position.  Estimated  blood loss and intraoperative fluids can be obtained from anesthesia records  as can accurate tourniquet time.  The tourniquet was deflated at the end of  the case during closure and the foot became pink and warm immediately.   DISPOSITION:  The patient was extubated in the operating room and taken to  the recovery room in stable  condition.  Plans were for him to be admitted  for 23 hour observation.  If he clears physical therapy, he will be able to  be discharged home but if he is does not we will to keep him longer.                                               [name] Jerl Santos, M.D.    PGD/MEDQ  D:  02/09/2002  T:  02/09/2002  Job:  [id number]

## 2010-06-20 NOTE — Op Note (Signed)
NAME:  Daniel Rivers, Daniel Rivers                        ACCOUNT NO.:  [id number]   MEDICAL RECORD NO.:  [id number]                   PATIENT TYPE:  AMB   LOCATION:  DAY                                  FACILITY:  [facility]   PHYSICIAN:  Gabrielle Dare. Janee Morn, M.D.             DATE OF BIRTH:  03/19/1950   DATE OF PROCEDURE:  02/01/2003  DATE OF DISCHARGE:                                 OPERATIVE REPORT   PREOPERATIVE DIAGNOSIS:  Rectal and anal pain.   POSTOPERATIVE DIAGNOSIS:  Large anterior fissure posteriorly.   PROCEDURE:  1. Examination under anesthesia.  2. Lateral internal sphincterotomy.  3. Repair large anal fissure.   SURGEON:  Gabrielle Dare. Janee Morn, M.D.   ANESTHESIA:  MAC   HISTORY OF PRESENT ILLNESS:  The patient is a 60 year old male who has had  severe chronic anal and rectal pain since last November.  He was evaluated  by Dr. Earlene Plater two days ago in our office and scheduled for examination under  anesthesia.  Dr. Earlene Plater had to go out of town so I evaluated the patient to  take over his care today.  The patient had still been in quite a significant  amount of pain.   DESCRIPTION OF PROCEDURE:  Informed consent was obtained.  The patient  received intravenous antibiotics, he was taken to the operating room, MAC  anesthesia was administered.  He was placed in lithotomy position.  His anal  area was prepped and draped in a sterile fashion.  Initial examination  demonstrated some noninflamed hemorrhoids and a very large posterior midline  anal fissure with exposed sphincter muscles.  No other masses were noted in  his rectum on digital or anoscopic exam.  The area was anesthetized with  0.25% Marcaine with epinephrine.  First a lateral internal sphincterotomy  was done. The anterior sphincteric space was palpated and the internal  sphincter was cut under direct palpation. Following this, there was some  increased relaxation of the patient's sphincter and two fingers could be  digitally  inserted into the rectum. Attention was then directed to the large  fissure which had as previously described exposed underlying sphincter  musculature. This was then closed due to its large size with a running 3-0  chromic suture.  Hemostasis was ensured. The sphincterotomy site was  reexamined and was noted to be hemostatic. The area was copiously irrigated.  Additional local anesthetic was injected and then a dressing of a rolled  Gelfoam was inserted into the rectum and a sterile dressing was applied.  The patient tolerated the procedure well and was taken to the recovery room  in stable condition.                                               Gabrielle Dare Janee Morn, M.D.  BET/MEDQ  D:  02/01/2003  T:  02/01/2003  Job:  [id number]

## 2010-06-20 NOTE — Assessment & Plan Note (Signed)
Wednesday, January 06, 2006:   Daniel Rivers is back regarding his ankylosing spondylitis and left shoulder  and right shoulder pain. Dr. Renae Fickle found a bone spur in his left shoulder  and he will have arthroscopic surgery on the 20th of this month. We did  a nerve conduction study in September and found mild slowing across the  left ulnar groove. There is also an ulnar to median anastomosis in the  distal forearm on the left. Mild carpal tunnel syndrome was in the left  side as well.   Otherwise, the patient is doing fairly well at this point. He rates his  pain at a 5 to 6/10. His low back pain is consistent and usually is  dull, aching and constant. The pain is most prominent in the daytime and  evening hours. Sleep is fair. The pain worsens with more activity. He  can walk about 10 minutes without having to stop. He needs to stay on  top of his weight, diet, exercise, etc.   REVIEW OF SYSTEMS:  The patient reports numbness, tingling, __________,  anxiety, night sweats, sugars labile at times. He often deals with  constipation, but is trying to use appropriate bowel medications to  address this. Full review is in the Health and History section.   SOCIAL HISTORY:  Is without significant change.   PHYSICAL EXAMINATION:  Blood pressure is 154/82, pulse is 91,  respiratory rate is 16. He is satting 98% on room air. The patient is  pleasant and in no acute distress. He is alert and oriented x3. Affect  is bright and appropriate.  Gait is stable.  Cognitively, he is intact.  HEART: Regular rate and rhythm.  LUNGS:  Are clear.  ABDOMEN: Soft and nontender.  Left shoulder remains tender with movement today. He has pain on moving  both bicipital tendons as well as with impingement maneuvers today.   ASSESSMENT:  1. 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  2. Obesity.  3. Left greater trochanteric bursitis.  4. History of right rotator cuff syndrome with new left rotator  cuff      symptoms and osteophytes.  5. History of left bicipital tenonitis.   PLAN:  1. Continue Avinza 90 mg every 12 hours and Vicodin 1-2 every 6 hours      as needed. Dispensed #60 and 120 of these medications today.  2. I will see the patient back in 3 month's time. The nurse clinic      will see him back in 1 month.      [name], M.D.  Electronically Signed     ZTS/MedQ  D:  01/06/2006 12:02:30  T:  01/06/2006 19:49:33  Job #:  [id number]

## 2010-06-20 NOTE — Op Note (Signed)
NAME:  Daniel Rivers, Daniel Rivers                        ACCOUNT NO.:  [id number]   MEDICAL RECORD NO.:  [id number]                   PATIENT TYPE:  AMB   LOCATION:  DSC                                  FACILITY:  [facility]   PHYSICIAN:  Gabrielle Dare. Janee Morn, M.D.             DATE OF BIRTH:  06/16/1950   DATE OF PROCEDURE:  09/27/2003  DATE OF DISCHARGE:                                 OPERATIVE REPORT   PREOPERATIVE DIAGNOSES:  1. Internal hemorrhoid.  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   POSTOPERATIVE DIAGNOSES:  1. Internal hemorrhoid.  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   OPERATION PERFORMED:  1. Examination under anesthesia.  2. Internal hemorrhoidectomy.  3. Anal fistulotomy.   SURGEON:  Gabrielle Dare. Janee Morn, M.D.   ANESTHESIA:  General.   INDICATIONS FOR PROCEDURE:  The patient is a 60 year old white male who I  have followed in the office for some chronic rectal bleeding which has been  intermittent and complicated by his chronic constipation.  He has been  treated with some sclerosing injections of a large internal hemorrhoid, but  more recently has developed symptoms and physical exam consistent with  fistula in ano as well.  He presents for examination under anesthesia,  internal hemorrhoidectomy and anal fistulotomy.   DESCRIPTION OF PROCEDURE:  Informed consent was obtained.  The patient  received intravenous antibiotics.  He was brought to the operating room.  General anesthesia was administered.  He was placed in lithotomy position.  His anorectal area was prepped and draped in sterile fashion.  Digital  rectal exam revealed the large internal hemorrhoid which he has had on the 8  o'clock position.  There is also a suspicious area for fistula in ano just  to the left of the posterior midline around the 5 o'clock position.  We  proceeded first with the hemorrhoidectomy.  A V-shaped incision was made  with the apex above the proximal extent of the hemorrhoid.  The mucosa was  divided.  The hemorrhoid tissue  was dissected free of underlying structures  and excised and sent to pathology.  Bovie cautery was used to assist in  hemostasis and then the mucosa was reapproximated with running 3-0 Vicryl  suture.  Excellent hemostasis was obtained after the suturing.  Subsequently, I reinspected the suspicious area around the 5 o'clock  position near the posterior midline.  The probe was used and easily passed  through a superficial anal fistula.  This fistula was opened up over the  probe using Bovie cautery. A curet was then used to debride out the tract.  Hemostasis was obtained with Bovie cautery.  The remainder of the patient's  rectal exam under anoscopy only showed some minimal internal hemorrhoids  around the 3 and 4 o'clock position.  No active bleeding  was present at the  beginning of the case.  The hemorrhoidectomy site was reinspected.  Excellent hemostasis was present and a  Gelfoaom dressing was  placed in the anorectal area and a sterile dressing  was applied.  Sponge, needle and instrument counts were all correct.  The  patient tolerated the procedure well without apparent complication and was  taken to the recovery room in stable condition.                                               Gabrielle Dare Janee Morn, M.D.    BET/MEDQ  D:  09/27/2003  T:  09/27/2003  Job:  [id number]

## 2010-06-20 NOTE — Assessment & Plan Note (Signed)
REASON FOR EVALUATION:  Daniel Rivers is back regarding his ankylosing spondylitis  of the neck and low back.  He did real well with his left hip injection at  last visit and really that has resolved.  He has had some problems with his  right shoulder again and periodically his left neck area.  He is still  unable to make it to therapy secondary to financial and transportation  constraints.  He is meeting with Dr. Deidre Ala regarding his right  shoulder and possible surgery down the line.  The right shoulder bothers him  with any type of manipulative activities.   REVIEW OF SYSTEMS:  On review of systems, the patient denies any new chest  pain or shortness of breath; no weakness or dizziness.  Bowel and bladder  function is intact.  The patient does note some days where his pain seems  more often higher than others where the OxyContin nor the Vicodin seem to  relieve his pain.   The patient reports his pain is a 4/10 today, running at 1-to-7/10 on  average.   PHYSICAL EXAMINATION:  GENERAL:  On physical examination today, the patient  is pleasant, in no acute distress.  VITAL SIGNS:  Blood pressure is 108/78, pulse is 106, saturating 98% on room  air.  NECK:  Range of motion remains very limited with approximately 5 degrees of  forward flexion.  NEUROMUSCULAR:  Lumbar flexion is minimal and most of his bending at the  waist is through the hips.  Strength appears 5/5 bilaterally.  He has  positive rotator cuff signs on the right arm.  The patient had minimal pain  today on the left hip with palpation.  Sensory exam was 2/2 throughout.   ASSESSMENT:  1. Ankylosing spondylitis of the cervical spine and lumbar spine.  2. Obesity.  3. Left greater trochanter bursitis.  4. Questionable facet arthropathy in the lumbar spine.  5. Rotator cuff syndrome on the right.   PLAN:  1. To keep better baseline control of his pain, I tried to switch him to     Avinza 60 mg daily, insurance  permitting.  Hopefully, this will keep him     from having some of the peaks and valleys in his pain control.  2. At some point, will likely consider physical therapy, once life and     monetary issues are more settled.  3. He may use Vicodin for breakthrough pain 5/500 mg 1 q.8 h. p.r.n. but I     dissuaded him from using this more than 4 times a day on bad days.  4. I will see him back in approximately 1 month's time.      [name], M.D.   ZTS/MedQ  D:  02/06/2003 13:32:21  T:  02/06/2003 14:17:44  Job #:  [id number]   cc:   Deidre Ala, M.D.  [address]  [place], [place] [address]  Fax: [phone]

## 2010-06-20 NOTE — Op Note (Signed)
NAME:  Daniel Rivers, Daniel Rivers              ACCOUNT NO.:  [id number]   MEDICAL RECORD NO.:  [id number]          PATIENT TYPE:  AMB   LOCATION:  N[facility]C                         FACILITY:  [facility]   PHYSICIAN:  Deidre Ala, M.D.    DATE OF BIRTH:  25-Nov-1950   DATE OF PROCEDURE:  01/21/2006  DATE OF DISCHARGE:  01/21/2006                               OPERATIVE REPORT   PREOPERATIVE DIAGNOSIS:  1. 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  2. Acromioclavicular joint arthritis.  3. Subdeltoid bursitis.  4. Rule out partial thickness rotator cuff tear.   POSTOPERATIVE DIAGNOSIS:  1. 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  2. Acromioclavicular joint arthritis, severe.  3. Partial thickness rotator cuff fraying.  4. Subdeltoid bursitis.  5. Glenoid synovitis.   PROCEDURE:  1. Left shoulder operative arthroscopy with subacromial arch      decompression acromioplasty.  2. Arthroscopic distal clavicle resection.  3. Subdeltoid bursectomy.  4. Debridement synovitis glenohumeral joint.   SURGEON:  1. Charlesetta Shanks, M.D.   ASSISTANT:  Phineas Semen, P.A.-C   ANESTHESIA:  General endotracheal.   CULTURES:  None.   DRAINS:  None.   ESTIMATED BLOOD LOSS:  Minimal.   PATHOLOGIC FINDINGS AND HISTORY:  Mandy is a 60 year old male with  some ankylosing spondylitis who I did a shoulder scope with SAD/VCR back  to a 105 and he had excellent long-term result.  He has had increasing  pain on the left side; and he had a type 3 acromion exam consistent with  persistent impingement; and elected to proceed with surgical  intervention.  At surgery we found synovitis all around the glenoid  labral with node attachment, no slap detachment, biceps intact.  The  glenohumeral surfaces had only minor degenerative changes.  There was  some synovitis under the rotator cuff.  The subacromial space was very  tight with thickened bursa, an obviously arthritic distal clavicle and  calcification and ossification within the CA ligament as well as in the  [facility] joint.  This was all resected to __________ margins.  He had some  fraying in the critical zone of the rotator cuff, but no through-and-  through tear.   DESCRIPTION OF PROCEDURE:  With adequate anesthesia obtained using  endotracheal technique after the block failed, the patient was placed in  the supine beach chair position.  The left shoulder was prepped and  draped in a standard fashion.  After standard prepping and draping, skin  markings were made for anatomic positioning.  I then injected 20 mL of  0.5% Marcaine with epinephrine in the subacromial space to open it up.  I then entered the shoulder through a posterior portal, the anterior  portal was established just lateral to the coracoid.  I then probed the  glenohumeral joint; and shaved the synovitis out.  I have smoothed with  the ablator on 1.  Portals were reversed and similar shavings were  carried out.  I then entered the subacromial space from the posterior  portal; and an anterolateral portal was established.   I then removed some ragged soft  tissue from the anterior undersurface of  the acromion; and proceeded to start subacromial decompression;  acromioplasty with a 6.0 bur, removing the CL ligament at the same time.  I then turned the scope medially sideways; and through an anterior port  portal did an arthroscopic distal clavicle resection 2 shaver breadths  in, lateral to the CC ligaments.  I then entered the shoulder from the  anterolateral portal and shaved out bursa with internal-and-external  rotation, neutral-and-abduction, and smoothed with the ablator on one.  The shoulder was then irrigated through the scope, 0.5% Marcaine with  epinephrine was injected gently and rather deeply in-and-around the  portals.  The portals were closed with 4-0 nylon.  A bulky sterile  compressive dressing was applied with a sling; and the patient having   tolerated the procedure well, was awakened, taken to recovery room in  satisfactory condition.  He will be discharged per outpatient routine;  given Percocet for pain; and told to call the office for recheck  tomorrow.           ______________________________  V. Charlesetta Shanks, M.D.     VEP/MEDQ  D:  01/21/2006  T:  01/21/2006  Job:  [id number]   cc:   [name], M.D.  Fax: [phone]

## 2010-06-20 NOTE — Assessment & Plan Note (Signed)
HISTORY:  The patient is back in followup of his chronic low back and neck  pain, which is related to his ankylosing spondylitis.  He has been making  good progress with his weight.  He is down to 215 pounds.  He is feeling  better from the standpoint of pain overall.  The pain currently is at a 3-  4/10.  He describes it as sharp, dull, intermittent and aching.  He has a  new spot on the left knee that has been bothersome, particularly when he  walks for prolonged periods.  He states that the pain interferes with  general activity and enjoyment of life on a moderate level.  The pain is  worse during his active hours.   REVIEW OF SYSTEMS:  The patient reports numbness, tingling, dizziness,  occasional confusion which is minimal really.  Denies any constitutional,  GI, GU or cardiorespiratory problems today.   PHYSICAL EXAMINATION:  VITAL SIGNS:  Blood pressure 141/83, pulse 75,  respirations 16.  Saturation 100% on room air.  GENERAL:  The patient is pleasant and in no acute distress.  He has lost a  noticeable weight and looks very good.  NEUROLOGIC:  Orientation is appropriate.  Affect is bright.  He is walking  with a slight limp and using his cane, but stable.  He does favor the left  leg with his gait.  Coordination is fair.  Reflexes are 1+.  Sensation is  generally intact in the lower extremities.  The patient has right shoulder  impingement signs.  Range of motion of the low back is 10-20 degrees with  flexion and extension.  He has 5-10 degrees of neck range in all planes.  The left knee was painful on the medial condyle of the femur.  He had  noticeable pain with palpation of the attachment of his adductor muscles.  Cognitively the patient is appropriate.   ASSESSMENT:  1.  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  2.  Obesity, which is under credible control.  3.  Left greater trochanter bursitis.  4.  History of right rotator cuff syndrome.   PLAN:  1.   The patient is doing very well at this point.  He is doing better after      losing weight.  Avinza seems to be controlling his pain well at 90 mg      q.12h.  I refilled this #60 today.  2.  He will use Vicodin 5/500 mg, two to three tab daily p.r.n. for break-      through pain.  3.  Discussed his left knee pain which may be tendinitis.  Recommended ice      and gentle stretching to the area.  4.  The patient will follow up in the nurse clinic in about one month's      time.  5.  I will see him back in three months.  He is doing quite well.      [name], M.D.  Electronically Signed     ZTS/MedQ  D:  09/19/2004 10:43:57  T:  09/19/2004 11:08:35  Job #:  [id number]

## 2010-06-20 NOTE — Discharge Summary (Signed)
   NAME:  Daniel Rivers, Daniel Rivers                        ACCOUNT NO.:  [id number]   MEDICAL RECORD NO.:  [id number]                   PATIENT TYPE:  INP   LOCATION:  5004                                 FACILITY:  [facility]   PHYSICIAN:  [name]. Jerl Santos, M.D.             DATE OF BIRTH:  May 20, 1950   DATE OF ADMISSION:  02/08/2002  DATE OF DISCHARGE:  02/11/2002                                 DISCHARGE SUMMARY   ADMITTING DIAGNOSES:  1. Bimalleolar ankle fracture on the left side.  2. History of carpal tunnel release.   DISCHARGE DIAGNOSES:  1. Bimalleolar ankle fracture on the left side.  2. History of carpal tunnel release.   OPERATIONS:  Left ankle bimalleolar ankle fracture and ORIF.   BRIEF HISTORY:  The patient is a 60 year old white male who had tripped on a  welcome mat and suffered an injury to his left ankle.  He was unable to bear  weight comfortably, was seen by the emergency room doctors at [facility] at which time x-rays were taken and he had a left ankle bimalleolar  fracture -- dislocation.  Discussed treatment options with the patient, that  being open reduction and internal fixation.   PERTINENT LABORATORY AND X-RAY DATA:  Essentially a normal EKG.  X-rays of  ankle fracture -- dislocation left ankle.  Lab work -- hemoglobin 16,  hematocrit 48, PT/INR was 1.0.   HOSPITAL COURSE:  The patient was admitted postoperatively and placed on a  variety of p.o. and IM analgesics for pain.  He was to be nonweightbearing.  Physical therapy was ordered to teach him that gait with crutches or a  walker.  He progressed well and was discharged home.   CONDITION ON DISCHARGE:  Improved.   FOLLOWUP:  Return to our office in about one week from his discharge.  He  will be nonweightbearing with crutches.  He was given a prescription for  pain medications -- Percocet, and if any problems arise, he was told to  call.     Lindwood Qua, P.A.                    [name], M.D.    MC/MEDQ  D:  03/14/2002  T:  03/14/2002  Job:  [id number]

## 2010-06-20 NOTE — Op Note (Signed)
   NAME:  Daniel Rivers, Daniel Rivers                        ACCOUNT NO.:  [id number]   MEDICAL RECORD NO.:  [id number]                   PATIENT TYPE:  AMB   LOCATION:  DSC                                  FACILITY:  [facility]   PHYSICIAN:  [name]. Jerl Santos, M.D.             DATE OF BIRTH:  January 24, 1951   DATE OF PROCEDURE:  04/25/2002  DATE OF DISCHARGE:                                 OPERATIVE REPORT   PREOPERATIVE DIAGNOSIS:  Left ankle retained hardware.   POSTOPERATIVE DIAGNOSIS:  Left ankle retained hardware.   PROCEDURE:  Removal of hardware, left ankle.   SURGEON:  [name]. Jerl Santos, M.D.   ASSISTANT:  Lindwood Qua, P.A.   ANESTHESIA:  Local.   INDICATIONS:  The patient is a 60 year old man who is a couple of months out  from ORIF of his left ankle which required placement of a syndesmotic screw.  He is scheduled for removal of this screw at this point.  Informed operative  consent was obtained after discussion of possible complications of, reaction  to anesthesia, infection, and failure of fixation.   DESCRIPTION OF PROCEDURE:  The patient was taken to the operative suite  where some local anesthetic was applied after a sterile prep.  A small  incision was made with dissection down to the syndesmotic screw.  I removed  this with a small fragment screwdriver.  Fluoroscopy was used to confirm  removal of appropriate hardware, and I read these views myself.  The wound  was irrigated followed by closure of the skin with a single suture nylon.  Adaptic was applied followed by a dry gauze dressing and his sock.  He was  then placed back in his Cam walker boot.  Estimated blood loss and  intraoperative fluids were both 0.   DISPOSITION:  The patient was taken to the recovery room in stable  condition.  Plans were for him to go home the same day and to follow up in  the office in less than one week.  I will contact him by phone tonight.     [name] Jerl Santos, M.D.    PGD/MEDQ  D:  04/25/2002  T:  04/26/2002  Job:  [id number]

## 2010-09-19 ENCOUNTER — Encounter: Payer: Medicare Other | Attending: Physical Medicine & Rehabilitation | Admitting: Physical Medicine & Rehabilitation

## 2010-09-19 DIAGNOSIS — M7530 Calcific tendinitis of unspecified shoulder: Secondary | ICD-10-CM

## 2010-09-19 DIAGNOSIS — M67919 Unspecified disorder of synovium and tendon, unspecified shoulder: Secondary | ICD-10-CM | POA: Insufficient documentation

## 2010-09-19 DIAGNOSIS — M719 Bursopathy, unspecified: Secondary | ICD-10-CM | POA: Insufficient documentation

## 2010-09-19 DIAGNOSIS — M659 Synovitis and tenosynovitis, unspecified: Secondary | ICD-10-CM

## 2010-09-19 DIAGNOSIS — E291 Testicular hypofunction: Secondary | ICD-10-CM

## 2010-09-19 DIAGNOSIS — M545 Low back pain, unspecified: Secondary | ICD-10-CM | POA: Insufficient documentation

## 2010-09-19 DIAGNOSIS — M79609 Pain in unspecified limb: Secondary | ICD-10-CM | POA: Insufficient documentation

## 2010-09-19 DIAGNOSIS — M459 Ankylosing spondylitis of unspecified sites in spine: Secondary | ICD-10-CM | POA: Insufficient documentation

## 2010-09-19 DIAGNOSIS — M7520 Bicipital tendinitis, unspecified shoulder: Secondary | ICD-10-CM | POA: Insufficient documentation

## 2010-09-19 DIAGNOSIS — M76899 Other specified enthesopathies of unspecified lower limb, excluding foot: Secondary | ICD-10-CM | POA: Insufficient documentation

## 2010-09-19 NOTE — Assessment & Plan Note (Signed)
Daniel Rivers is back regarding his low back and extremity pain.  He had a stressful summer as his wife became critically ill, but she is now pull- through.  Apparently, she had a reaction to a drug she was taking for her arthritis.  Daniel Rivers is doing pretty well near his baseline with pain ranging about 3-5/10.  He is not active as he has been due to his psychosocial issues.  He does complain of some occasional tingling down the left leg which usually related to him sitting for long periods of time on certain chair.  Pain usually runs from his buttock region down the leg to the foot.  REVIEW OF SYSTEMS:  Notable for the above.  Full 12-point review is in the written health and history section of the chart.  SOCIAL HISTORY:  Unchanged.  He is married and other issues are noted above.  PHYSICAL EXAMINATION:  VITAL SIGNS:  Blood pressure 130/63, pulse 80, respiratory rate 16, and he is saturating 97% on room air. GENERAL:  The patient is pleasant and alert. MUSCULOSKELETAL:  Back range of motion is unchanged.  He may have gained a bit of weight since I last saw him.  His strength is 5/5.  He has no focal sensory findings that I can seen on left lower extremity.  Sciatic stretch was normal.  ASSESSMENT: 1. 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 2. Left greater trochanter bursitis. 3. Bilateral rotator cuff syndrome. 4. Left bicipital tendonitis.  PLAN: 1. The patient may be having some transient stretch and pressure upon     his sciatic nerve while he is sitting.  He is aware that this may     be leading to the symptoms and he needs to look at alternative     means for cushioning or alterative positions.  His diabetes does     not help matters there either. 2. I refilled his morphine 60 mg #270 and Norco 7.5 #270 as well with     72-month prescriptions. 3. I will see him back here in 3 months' time.     [name], M.D. Electronically  Signed    ZTS/MedQ D:  09/19/2010 11:09:06  T:  09/19/2010 13:00:39  Job #:  [id number]

## 2010-10-24 LAB — CBC
Hemoglobin: 14
MCV: 88.8
Platelets: 136 — ABNORMAL LOW
RDW: 12.9

## 2010-10-24 LAB — CARDIAC PANEL(CRET KIN+CKTOT+MB+TROPI): Relative Index: 1.8

## 2010-10-24 LAB — BASIC METABOLIC PANEL
BUN: 10
CO2: 29
Creatinine, Ser: 0.77
GFR calc Af Amer: >60
Glucose, Bld: 133 — ABNORMAL HIGH
Sodium: 136

## 2010-10-27 LAB — POCT I-STAT 4, (NA,K, GLUC, HGB,HCT)
HCT: 44
Hemoglobin: 15
Operator id: 114661
Potassium: 3.9
Sodium: 138

## 2010-10-30 LAB — HEMOGLOBIN AND HEMATOCRIT, BLOOD
HCT: 41.7
Hemoglobin: 14.5

## 2010-11-11 LAB — POCT I-STAT 4, (NA,K, GLUC, HGB,HCT)
Glucose, Bld: 170 — ABNORMAL HIGH
Hemoglobin: 16.3
Sodium: 138

## 2010-11-14 LAB — HEMOGLOBIN AND HEMATOCRIT, BLOOD: HCT: 44.8

## 2010-12-19 ENCOUNTER — Encounter: Payer: Medicare Other | Attending: Physical Medicine & Rehabilitation | Admitting: Physical Medicine & Rehabilitation

## 2010-12-19 ENCOUNTER — Ambulatory Visit: Payer: Medicare Other | Admitting: Physical Medicine & Rehabilitation

## 2010-12-19 DIAGNOSIS — E291 Testicular hypofunction: Secondary | ICD-10-CM

## 2010-12-19 DIAGNOSIS — M459 Ankylosing spondylitis of unspecified sites in spine: Secondary | ICD-10-CM | POA: Insufficient documentation

## 2010-12-19 DIAGNOSIS — M7530 Calcific tendinitis of unspecified shoulder: Secondary | ICD-10-CM

## 2010-12-19 DIAGNOSIS — M545 Low back pain, unspecified: Secondary | ICD-10-CM | POA: Insufficient documentation

## 2010-12-19 DIAGNOSIS — M7520 Bicipital tendinitis, unspecified shoulder: Secondary | ICD-10-CM | POA: Insufficient documentation

## 2010-12-19 DIAGNOSIS — G8929 Other chronic pain: Secondary | ICD-10-CM | POA: Insufficient documentation

## 2010-12-19 DIAGNOSIS — M659 Unspecified synovitis and tenosynovitis, unspecified site: Secondary | ICD-10-CM

## 2010-12-19 DIAGNOSIS — M719 Bursopathy, unspecified: Secondary | ICD-10-CM | POA: Insufficient documentation

## 2010-12-19 DIAGNOSIS — M67919 Unspecified disorder of synovium and tendon, unspecified shoulder: Secondary | ICD-10-CM | POA: Insufficient documentation

## 2010-12-19 DIAGNOSIS — M76899 Other specified enthesopathies of unspecified lower limb, excluding foot: Secondary | ICD-10-CM | POA: Insufficient documentation

## 2010-12-19 NOTE — Assessment & Plan Note (Signed)
Daniel Rivers' birth date 04-04-1950, is back regarding his chronic low back pain.  His wife unfortunately died 2 months ago from metastatic lung cancer.  His mother diet as well.  His wife's death was unexpected. He has been coping with things using family and friends as supports.  He is trying to stay somewhat active.  Fortunately, the losses have not cause any increases in his pain for the most part.  Pain is about 4- 5/10, described as sharp, burning, dull, stabbing, tingling, and aching. Sleeping fair.  REVIEW OF SYSTEMS:  Notable for reactive depression.  Other pertinent positives as above.  Full 12-point review is in the written health and history section of the chart.  SOCIAL HISTORY:  Noted above.  PHYSICAL EXAMINATION:  Blood pressure is 142/61, pulse is 94, respiratory rate is 18, he is satting 98% on room air.  Patient is pleasant, alert.  Weight is stable.  Strength remains 5/5 in all 4s except for some pain inhibition, weakness at the hips, as well as at the shoulders.  No sensory changes were seen.  Back was limited with flexion extension as was the neck once again.  ASSESSMENT: 1. 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 2. Left greater trochanter bursitis. 3. Bilateral rotator cuff syndrome. 4. Left bicipital tendonitis.  PLAN: 1. Continue with home exercise and stretching program.  His sciatic     symptoms we talked about the summer seem to be better. 2. Current medication regimen is still effective.  Continue morphine     60 mg CR q.8 hours #270 and Norco 7.5, #270 with 3 month     prescriptions through mail order. 3. He will see my nurse practitioner back in 3 months.     [name], M.D. Electronically Signed    ZTS/MedQ D:  12/19/2010 09:42:26  T:  12/19/2010 11:13:03  Job #:  [id number]

## 2011-03-09 DIAGNOSIS — M549 Dorsalgia, unspecified: Secondary | ICD-10-CM | POA: Insufficient documentation

## 2011-03-12 ENCOUNTER — Encounter: Payer: Medicare Other | Attending: Neurosurgery | Admitting: Neurosurgery

## 2011-03-12 DIAGNOSIS — M545 Low back pain, unspecified: Secondary | ICD-10-CM | POA: Insufficient documentation

## 2011-03-12 DIAGNOSIS — M459 Ankylosing spondylitis of unspecified sites in spine: Secondary | ICD-10-CM | POA: Insufficient documentation

## 2011-03-12 DIAGNOSIS — M542 Cervicalgia: Secondary | ICD-10-CM

## 2011-03-12 DIAGNOSIS — M76899 Other specified enthesopathies of unspecified lower limb, excluding foot: Secondary | ICD-10-CM | POA: Insufficient documentation

## 2011-03-12 DIAGNOSIS — M719 Bursopathy, unspecified: Secondary | ICD-10-CM | POA: Insufficient documentation

## 2011-03-12 DIAGNOSIS — G8929 Other chronic pain: Secondary | ICD-10-CM | POA: Insufficient documentation

## 2011-03-12 DIAGNOSIS — M67919 Unspecified disorder of synovium and tendon, unspecified shoulder: Secondary | ICD-10-CM | POA: Insufficient documentation

## 2011-03-13 NOTE — Assessment & Plan Note (Signed)
This is a patient Dr. Riley Kill seen for chronic low back pain and history of left greater trochanter bursitis.  He rates his pain unchanged at 4- 5.  General activity level is 0-5.  His pain is worse during the day. All activities aggravate.  Rest, heat, and medication help.  He uses a cane for ambulation.  Functionally, he is independent.  REVIEW OF SYSTEMS:  Notable for difficulties as described above as well as some paresthesias and depression.  No suicidal thoughts or aberrant behaviors.  Pill counts correct.  PAST MEDICAL HISTORY/SOCIAL HISTORY/FAMILY HISTORY:  Unchanged.  PHYSICAL EXAMINATION:  Blood pressure 125/65, pulse 86, respirations 16, O2 sats 96 on room air.  His strength and sensation are intact in upper and lower extremities.  Constitutionally, he is within normal limits. He is alert and oriented x3.  He walks with slight limp.  ASSESSMENT: 1. History of ankylosing spondylitis of the cervical and lumbar spine. 2. History of left greater trochanter bursitis. 3. Bilateral rotator cuff syndrome.  PLAN: 1. He was given 3 months supply of Norco 7.5/325 one p.o. q.6 hours     p.r.n., #360 with no refill. 2. MS Contin ER 60 mg 1 p.o. q.8 hours, 270 with no refills.     Questions were encouraged and answered.  He will follow up in 3     months.     Kazaria Gaertner L. Blima Dessert    RLW/MedQ D:  03/12/2011 09:01:43  T:  03/13/2011 01:25:04  Job #:  [id number]

## 2011-04-30 ENCOUNTER — Other Ambulatory Visit: Payer: Self-pay | Admitting: *Deleted

## 2011-04-30 MED ORDER — POLYETHYLENE GLYCOL 3350 17 G PO PACK: 17.0000 g | PACK | Freq: Two times a day (BID) | ORAL | Status: DC

## 2011-05-05 ENCOUNTER — Other Ambulatory Visit: Payer: Self-pay | Admitting: *Deleted

## 2011-05-08 ENCOUNTER — Other Ambulatory Visit: Payer: Self-pay | Admitting: *Deleted

## 2011-06-08 ENCOUNTER — Encounter: Payer: Medicare Other | Attending: Neurosurgery | Admitting: Physical Medicine & Rehabilitation

## 2011-06-08 ENCOUNTER — Encounter: Payer: Self-pay | Admitting: Physical Medicine & Rehabilitation

## 2011-06-08 VITALS — BP 149/76 | HR 87 | Ht 73.0 in | Wt 262.0 lb

## 2011-06-08 DIAGNOSIS — M7060 Trochanteric bursitis, unspecified hip: Secondary | ICD-10-CM | POA: Insufficient documentation

## 2011-06-08 DIAGNOSIS — M719 Bursopathy, unspecified: Secondary | ICD-10-CM | POA: Insufficient documentation

## 2011-06-08 DIAGNOSIS — M67919 Unspecified disorder of synovium and tendon, unspecified shoulder: Secondary | ICD-10-CM | POA: Insufficient documentation

## 2011-06-08 DIAGNOSIS — M459 Ankylosing spondylitis of unspecified sites in spine: Secondary | ICD-10-CM | POA: Insufficient documentation

## 2011-06-08 DIAGNOSIS — M76899 Other specified enthesopathies of unspecified lower limb, excluding foot: Secondary | ICD-10-CM | POA: Insufficient documentation

## 2011-06-08 MED ORDER — HYDROCODONE-ACETAMINOPHEN 7.5-325 MG PO TABS: 1.0000 | ORAL_TABLET | Freq: Four times a day (QID) | ORAL | Status: DC | PRN

## 2011-06-08 MED ORDER — MORPHINE SULFATE ER 60 MG PO TBCR: 60.0000 mg | EXTENDED_RELEASE_TABLET | Freq: Three times a day (TID) | ORAL | Status: DC

## 2011-06-08 NOTE — Patient Instructions (Signed)
Remain active as you can

## 2011-06-08 NOTE — Progress Notes (Signed)
Subjective:    Patient ID: OZRO RUSSETT, male    DOB: 1950/10/30, 61 y.o.   MRN: [id number]  HPI  Daniel Rivers is back regarding his chronic back and shoulder pain. He has been tighter with the colder and damp weather as of late. He is still trying to walk on a regular basis. He understands the importance of regular exercise. He tries to stay active at home as well.   He remains on the ms contin and norco with good benefit. Bowel bladder function remain regular.  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 He remains involved with the cats partly because his cat was a cat lover.  Pain Inventory Average Pain 6 Pain Right Now 5 My pain is sharp, burning, dull and aching  In the last 24 hours, has pain interfered with the following? General activity 7 Relation with others 4 Enjoyment of life 7 What TIME of day is your pain at its worst? day and evening Sleep (in general) Fair  Pain is worse with: walking, bending, sitting, standing and some activites Pain improves with: rest, heat/ice, pacing activities and medication Relief from Meds: 5  Mobility use a cane Do you have any goals in this area?  no  Function Do you have any goals in this area?  no  Neuro/Psych No problems in this area  Prior Studies Any changes since last visit?  no  Physicians involved in your care Any changes since last visit?  no       Review of Systems  Constitutional: Negative.        Night sweats, skin irritation, high blood sugar, constipation, poor appetite.  HENT: Negative.   Eyes: Negative.   Respiratory: Negative.   Cardiovascular: Negative.   Gastrointestinal: Negative.   Genitourinary: Negative.   Musculoskeletal: Negative.   Skin: Negative.   Neurological: Negative.   Hematological: Negative.   Psychiatric/Behavioral: Negative.        Objective:   Physical Exam  Constitutional: He is oriented to person, place, and time. He  appears well-developed and well-nourished.  HENT:  Head: Normocephalic and atraumatic.  Nose: Nose normal.  Mouth/Throat: Oropharynx is clear and moist.  Eyes: Conjunctivae are normal. Pupils are equal, round, and reactive to light.  Cardiovascular: Normal rate and regular rhythm.   Pulmonary/Chest: Effort normal. No respiratory distress. He has no wheezes.  Abdominal: He exhibits no distension. There is no tenderness.  Musculoskeletal:       Left shoulder: He exhibits decreased range of motion, tenderness and bony tenderness.       Lumbar back: He exhibits decreased range of motion, tenderness, bony tenderness, swelling and edema.  Neurological: He is alert and oriented to person, place, and time.   he has more difficulty with flexion exercises in the lumbar spine as well as the back today. He transfers easily from a seated to standing position and balance is good. He uses a cane for balance as well.        Assessment & Plan:  ASSESSMENT:  1. 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  2. Left greater trochanter bursitis.  3. Bilateral rotator cuff syndrome.  4. Left bicipital tendonitis.  PLAN:  1. Continue with home exercise and stretching program. He understands the importance of regular exercise. 2. Current medication regimen is still effective. Continue morphine  60 mg CR q.8 hours #270 and Norco 7.5, #270 with 3 month  prescriptions were written today.  3. He will see my nurse practitioner back in 3 months. All questions were encouraged and answered.

## 2011-06-09 ENCOUNTER — Encounter: Payer: Self-pay | Admitting: Physical Medicine & Rehabilitation

## 2011-06-12 ENCOUNTER — Telehealth: Payer: Self-pay | Admitting: Physical Medicine & Rehabilitation

## 2011-06-12 NOTE — Telephone Encounter (Addendum)
Please advise.  NO ACTION AT THIS TIME--ZTS

## 2011-06-12 NOTE — Telephone Encounter (Signed)
Asked pt if he had taken anything with codeine in it and he denies taking any medication that he has not been prescribed.  Informed pt codeine showed up in his UDS.

## 2011-06-12 NOTE — Telephone Encounter (Signed)
Lm for pt to call office regarding UDS results.

## 2011-06-12 NOTE — Telephone Encounter (Signed)
We called him and he is returning the call.

## 2011-08-17 ENCOUNTER — Other Ambulatory Visit: Payer: Self-pay | Admitting: *Deleted

## 2011-09-07 ENCOUNTER — Encounter
Payer: Medicare Other | Attending: Physical Medicine and Rehabilitation | Admitting: Physical Medicine and Rehabilitation

## 2011-09-18 ENCOUNTER — Encounter: Payer: Self-pay | Admitting: Physical Medicine and Rehabilitation

## 2011-09-18 ENCOUNTER — Encounter
Payer: Medicare Other | Attending: Physical Medicine and Rehabilitation | Admitting: Physical Medicine and Rehabilitation

## 2011-09-18 VITALS — BP 115/61 | HR 90 | Resp 14 | Ht 73.0 in | Wt 258.0 lb

## 2011-09-18 DIAGNOSIS — M719 Bursopathy, unspecified: Secondary | ICD-10-CM | POA: Insufficient documentation

## 2011-09-18 DIAGNOSIS — M25519 Pain in unspecified shoulder: Secondary | ICD-10-CM | POA: Insufficient documentation

## 2011-09-18 DIAGNOSIS — I10 Essential (primary) hypertension: Secondary | ICD-10-CM | POA: Insufficient documentation

## 2011-09-18 DIAGNOSIS — M7060 Trochanteric bursitis, unspecified hip: Secondary | ICD-10-CM

## 2011-09-18 DIAGNOSIS — M76899 Other specified enthesopathies of unspecified lower limb, excluding foot: Secondary | ICD-10-CM

## 2011-09-18 DIAGNOSIS — M549 Dorsalgia, unspecified: Secondary | ICD-10-CM | POA: Insufficient documentation

## 2011-09-18 DIAGNOSIS — M459 Ankylosing spondylitis of unspecified sites in spine: Secondary | ICD-10-CM | POA: Insufficient documentation

## 2011-09-18 DIAGNOSIS — E119 Type 2 diabetes mellitus without complications: Secondary | ICD-10-CM | POA: Insufficient documentation

## 2011-09-18 DIAGNOSIS — M67919 Unspecified disorder of synovium and tendon, unspecified shoulder: Secondary | ICD-10-CM | POA: Insufficient documentation

## 2011-09-18 DIAGNOSIS — E669 Obesity, unspecified: Secondary | ICD-10-CM | POA: Insufficient documentation

## 2011-09-18 DIAGNOSIS — G8929 Other chronic pain: Secondary | ICD-10-CM | POA: Insufficient documentation

## 2011-09-18 MED ORDER — MORPHINE SULFATE ER 60 MG PO TBCR: 60.0000 mg | EXTENDED_RELEASE_TABLET | Freq: Three times a day (TID) | ORAL | Status: DC

## 2011-09-18 MED ORDER — HYDROCODONE-ACETAMINOPHEN 7.5-325 MG PO TABS: 1.0000 | ORAL_TABLET | Freq: Four times a day (QID) | ORAL | Status: DC | PRN

## 2011-09-18 NOTE — Patient Instructions (Signed)
Continue with exercises and with walking program.

## 2011-09-18 NOTE — Progress Notes (Signed)
Subjective:    Patient ID: Daniel Rivers, male    DOB: 10-17-1950, 61 y.o.   MRN: [id number]  HPI Daniel Rivers is back regarding his chronic back and shoulder pain.  He is still trying to walk on a regular basis. He understands the importance of regular exercise. He tries to stay active at home as well.  He remains on the ms contin and norco with good benefit. Bowel bladder function remain regular.  Pain Inventory Average Pain 6 Pain Right Now 5 My pain is sharp, burning, dull, stabbing, tingling and aching  In the last 24 hours, has pain interfered with the following? General activity 5 Relation with others 3 Enjoyment of life 6 What TIME of day is your pain at its worst? daytime Sleep (in general) Fair  Pain is worse with: walking, bending, sitting, standing and some activites Pain improves with: rest, heat/ice, pacing activities and medication Relief from Meds: 5  Mobility use a cane how many minutes can you walk? 10-20 ability to climb steps?  yes do you drive?  yes Do you have any goals in this area?  no  Function disabled: date disabled 08/2007 Do you have any goals in this area?  yes  Neuro/Psych loss of taste or smell  Prior Studies Any changes since last visit?  no  Physicians involved in your care Any changes since last visit?  no   History reviewed. No pertinent family history. History   Social History  . Marital Status: Married    Spouse Name: N/A    Number of Children: N/A  . Years of Education: N/A   Social History Main Topics  . Smoking status: Never Smoker   . Smokeless tobacco: None  . Alcohol Use: None  . Drug Use: None  . Sexually Active: None   Other Topics Concern  . None   Social History Narrative  . None   Past Surgical History  Procedure Date  . Cortisone injection   . Small intestine surgery   . Spine surgery   . Neuroplasty / transposition median nerve at carpal tunnel bilateral   . Elbow surgery   . Rectal fissure repair    . Rotator cuff repair   . Stint placement    Past Medical History  Diagnosis Date  . Ankylosing spondylitis of lumbar region   . Ankylosing spondylitis of cervical region   . Obesity   . Bursitis, trochanteric   . Rotator cuff syndrome   . Tendonitis   . Depression   . Anxiety   . Back pain   . Hypertension   . Diabetes mellitus    BP 115/61  Pulse 90  Resp 14  Ht 6\' 1"  (1.854 m)  Wt 258 lb (117.028 kg)  BMI 34.04 kg/m2  SpO2 97%     Review of Systems  Constitutional: Positive for diaphoresis.  HENT: Positive for neck pain.   Gastrointestinal: Positive for constipation.  Musculoskeletal: Positive for myalgias, back pain and arthralgias.  All other systems reviewed and are negative.       Objective:   Physical Exam  Constitutional: He is oriented to person, place, and time. He appears well-developed and well-nourished.  HENT:  Head: Normocephalic.  Musculoskeletal: He exhibits tenderness.  Neurological: He is alert and oriented to person, place, and time.  Skin: Skin is warm and dry.  Psychiatric: He has a normal mood and affect.    Symmetric normal motor tone is noted throughout. Normal muscle bulk. Muscle testing reveals 5/5  muscle strength of the upper extremity, and 5/5 of the lower extremity, except iliopsoas bilateral 4/5. Full range of motion in upper and lower extremities. ROM of spine is restricted, autofused L-spine, C-spine rot. To the left 10 degrees, to the right 15 degrees, flex/ext, 10/5 degrees. Fine motor movements are normal in both hands. Sensory is intact and symmetric to light touch, pinprick and proprioception. DTR in the upper and lower extremity are present and symmetric 2+. No clonus is noted.  Patient arises from chair with difficulty. Wide based gait with normal arm swing bilateral , able to walk on heels and toes . Tandem walk is stable. No pronator drift. Rhomberg negative.       Assessment & Plan:  1. Ankylosing spondylitis of  the cervical and lumbar spine.  2.  Bilateral rotator cuff syndrome.  PLAN:  1. Continue with home exercise and stretching program. He understands the importance of regular exercise.  2. Current medication regimen is still effective. Continue morphine  60 mg CR q.8 hours #270 and Norco 7.5, #270 with 3 month  prescriptions were written today.  3. He will see PA back in 3 months. All questions were encouraged and answered.

## 2011-12-11 ENCOUNTER — Telehealth: Payer: Self-pay

## 2011-12-11 NOTE — Telephone Encounter (Signed)
Patient called to find out why his testosterone request was denied.

## 2011-12-14 NOTE — Telephone Encounter (Signed)
Left VM for Daniel Rivers to return our call.  There is no record of Dr Riley Kill prescribing testosterone and no lab work indicating he had checked his testosterone level.  Therefore we cannot refill his testosterone, without Dr Riley Kill confirming.

## 2011-12-16 NOTE — Telephone Encounter (Signed)
I spoke with Daniel Rivers and explained that we can find no record of him prescribing the testosterone or testing for his testosterone level so that we can not confirm until Daniel Rivers is back on Friday.  I apologized but our hands are tied until we speak to Daniel Rivers. Daniel Rivers has an appointment with our PA on Friday.  Addendum:  I did find a note in where Daniel Rivers mentioned continuing testosterone therapy and retest in summer, but this was in May 2012 and no mention since. No lab located so he will need to have testosterone drawn to check level.

## 2011-12-18 ENCOUNTER — Encounter: Payer: Self-pay | Admitting: Physical Medicine and Rehabilitation

## 2011-12-18 ENCOUNTER — Encounter
Payer: Medicare Other | Attending: Physical Medicine and Rehabilitation | Admitting: Physical Medicine and Rehabilitation

## 2011-12-18 VITALS — BP 143/79 | HR 84 | Resp 16 | Ht 73.0 in | Wt 259.0 lb

## 2011-12-18 DIAGNOSIS — M719 Bursopathy, unspecified: Secondary | ICD-10-CM | POA: Insufficient documentation

## 2011-12-18 DIAGNOSIS — M76899 Other specified enthesopathies of unspecified lower limb, excluding foot: Secondary | ICD-10-CM

## 2011-12-18 DIAGNOSIS — E349 Endocrine disorder, unspecified: Secondary | ICD-10-CM | POA: Insufficient documentation

## 2011-12-18 DIAGNOSIS — G8929 Other chronic pain: Secondary | ICD-10-CM | POA: Insufficient documentation

## 2011-12-18 DIAGNOSIS — M7060 Trochanteric bursitis, unspecified hip: Secondary | ICD-10-CM

## 2011-12-18 DIAGNOSIS — M67919 Unspecified disorder of synovium and tendon, unspecified shoulder: Secondary | ICD-10-CM | POA: Insufficient documentation

## 2011-12-18 DIAGNOSIS — E291 Testicular hypofunction: Secondary | ICD-10-CM | POA: Insufficient documentation

## 2011-12-18 DIAGNOSIS — M459 Ankylosing spondylitis of unspecified sites in spine: Secondary | ICD-10-CM | POA: Insufficient documentation

## 2011-12-18 DIAGNOSIS — M25559 Pain in unspecified hip: Secondary | ICD-10-CM | POA: Insufficient documentation

## 2011-12-18 DIAGNOSIS — M549 Dorsalgia, unspecified: Secondary | ICD-10-CM

## 2011-12-18 MED ORDER — MELOXICAM 7.5 MG PO TABS: 7.5000 mg | ORAL_TABLET | Freq: Every day | ORAL | Status: DC

## 2011-12-18 MED ORDER — HYDROCODONE-ACETAMINOPHEN 7.5-325 MG PO TABS: 1.0000 | ORAL_TABLET | Freq: Four times a day (QID) | ORAL | Status: DC | PRN

## 2011-12-18 MED ORDER — MORPHINE SULFATE ER 60 MG PO TBCR: 60.0000 mg | EXTENDED_RELEASE_TABLET | Freq: Three times a day (TID) | ORAL | Status: DC

## 2011-12-18 NOTE — Patient Instructions (Addendum)

## 2011-12-18 NOTE — Progress Notes (Signed)
Subjective:    Patient ID: Daniel Rivers, male    DOB: 06/20/1950, 61 y.o.   MRN: [id number]  HPI Daniel Rivers is back regarding his chronic pain. His pain in the hips is increasing, especially his right hip. It bothers him most when he walks or stands. It often is irritated when he lies on a side while he's sleeping.   His back and shoulders have been fairly stable although he finds that he is "bent" over more.    Pain Inventory Average Pain 6 Pain Right Now 7 My pain is sharp, burning, dull, stabbing, tingling and aching  In the last 24 hours, has pain interfered with the following? General activity 5 Relation with others 3 Enjoyment of life 6 What TIME of day is your pain at its worst? daytime Sleep (in general) Fair  Pain is worse with: walking, sitting, inactivity and standing Pain improves with: rest, pacing activities and medication Relief from Meds: 5  Mobility walk without assistance Do you have any goals in this area?  no  Function retired  Neuro/Psych dizziness confusion loss of taste or smell  Prior Studies Any changes since last visit?  no  Physicians involved in your care Any changes since last visit?  no   History reviewed. No pertinent family history. History   Social History  . Marital Status: Married    Spouse Name: N/A    Number of Children: N/A  . Years of Education: N/A   Social History Main Topics  . Smoking status: Never Smoker   . Smokeless tobacco: None  . Alcohol Use: None  . Drug Use: None  . Sexually Active: None   Other Topics Concern  . None   Social History Narrative  . None   Past Surgical History  Procedure Date  . Cortisone injection   . Small intestine surgery   . Spine surgery   . Neuroplasty / transposition median nerve at carpal tunnel bilateral   . Elbow surgery   . Rectal fissure repair   . Rotator cuff repair   . Stint placement    Past Medical History  Diagnosis Date  . Ankylosing spondylitis of  lumbar region   . Ankylosing spondylitis of cervical region   . Obesity   . Bursitis, trochanteric   . Rotator cuff syndrome   . Tendonitis   . Depression   . Anxiety   . Back pain   . Hypertension   . Diabetes mellitus    BP 143/79  Pulse 84  Resp 16  Ht 6\' 1"  (1.854 m)  Wt 259 lb (117.482 kg)  BMI 34.17 kg/m2  SpO2 99%    Review of Systems  Constitutional: Positive for appetite change.  Gastrointestinal: Positive for constipation.  Musculoskeletal: Positive for back pain.  Neurological: Positive for dizziness.  Psychiatric/Behavioral: Positive for confusion.  All other systems reviewed and are negative.       Objective:   Physical Exam Constitutional: He is oriented to person, place, and time. He appears well-developed and well-nourished.  HENT:  Head: Normocephalic and atraumatic.  Nose: Nose normal.  Mouth/Throat: Oropharynx is clear and moist.  Eyes: Conjunctivae are normal. Pupils are equal, round, and reactive to light.  Cardiovascular: Normal rate and regular rhythm.  Pulmonary/Chest: Effort normal. No respiratory distress. He has no wheezes.  Abdominal: He exhibits no distension. There is no tenderness.  Musculoskeletal:  Left shoulder: He exhibits decreased range of motion, tenderness and bony tenderness.  Lumbar back: He exhibits decreased range  of motion, tenderness, bony tenderness, swelling and edema.  Both greater trochs are tender, especially with cross legged maneuver and with manual palpation. Neurological: He is alert and oriented to person, place, and time.  he has more difficulty with flexion exercises in the lumbar spine as well as the back today. He transfers easily from a seated to standing position and balance is good. He uses a cane for balance as well.   Assessment & Plan:    ASSESSMENT:  1. 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  2. Left and right greater trochanter bursitis.  3. Bilateral rotator cuff syndrome.    4. Left bicipital tendonitis.  5. Hypo-testosterone state:   PLAN:  1. Continue with home exercise and stretching program. He understands the importance of regular exercise. I provided him greater troch exercises today. He may also utilize ice.  2. Current medication regimen is still effective. Continue morphine  60 mg CR q.8 hours #270 and Norco 7.5, #270 with 3 month  prescriptions were written today.  3. Introduce mobic 7.5mg  temporarily for his hips. Can change to prn. Don't want to use this long term on a scheduled basis given his aspirin use. 4. Check testosterone level today. Will call his pharmacy regarding his home dosing.  5. He will see me back in 3 months. All questions were encouraged and answered

## 2011-12-22 ENCOUNTER — Telehealth: Payer: Self-pay | Admitting: Physical Medicine & Rehabilitation

## 2011-12-22 DIAGNOSIS — E349 Endocrine disorder, unspecified: Secondary | ICD-10-CM

## 2011-12-22 MED ORDER — NONFORMULARY OR COMPOUNDED ITEM: Status: DC

## 2011-12-22 NOTE — Telephone Encounter (Signed)
i entered the rx as a phone in item. Could you please call this to [facility]?  Thank you!

## 2012-02-11 ENCOUNTER — Other Ambulatory Visit: Payer: Self-pay | Admitting: Physical Medicine & Rehabilitation

## 2012-03-17 ENCOUNTER — Encounter: Payer: Medicare Other | Admitting: Physical Medicine and Rehabilitation

## 2012-03-22 ENCOUNTER — Encounter: Payer: Medicare Other | Admitting: Physical Medicine and Rehabilitation

## 2012-03-25 ENCOUNTER — Encounter
Payer: Medicare Other | Attending: Physical Medicine and Rehabilitation | Admitting: Physical Medicine and Rehabilitation

## 2012-03-25 ENCOUNTER — Other Ambulatory Visit: Payer: Self-pay | Admitting: Internal Medicine

## 2012-03-25 ENCOUNTER — Encounter: Payer: Self-pay | Admitting: Physical Medicine and Rehabilitation

## 2012-03-25 VITALS — BP 142/77 | HR 91 | Resp 14 | Ht 73.0 in | Wt 261.0 lb

## 2012-03-25 DIAGNOSIS — M719 Bursopathy, unspecified: Secondary | ICD-10-CM | POA: Insufficient documentation

## 2012-03-25 DIAGNOSIS — G8929 Other chronic pain: Secondary | ICD-10-CM | POA: Insufficient documentation

## 2012-03-25 DIAGNOSIS — M67919 Unspecified disorder of synovium and tendon, unspecified shoulder: Secondary | ICD-10-CM | POA: Insufficient documentation

## 2012-03-25 DIAGNOSIS — M459 Ankylosing spondylitis of unspecified sites in spine: Secondary | ICD-10-CM | POA: Insufficient documentation

## 2012-03-25 MED ORDER — MORPHINE SULFATE ER 60 MG PO TBCR: 60.0000 mg | EXTENDED_RELEASE_TABLET | Freq: Three times a day (TID) | ORAL | Status: DC

## 2012-03-25 MED ORDER — MELOXICAM 7.5 MG PO TABS: 7.5000 mg | ORAL_TABLET | Freq: Every day | ORAL | Status: DC

## 2012-03-25 MED ORDER — HYDROCODONE-ACETAMINOPHEN 7.5-325 MG PO TABS: 1.0000 | ORAL_TABLET | Freq: Four times a day (QID) | ORAL | Status: DC | PRN

## 2012-03-25 NOTE — Progress Notes (Signed)
Subjective:    Patient ID: Daniel Rivers, male    DOB: 1950-05-12, 62 y.o.   MRN: [id number]  HPI The patient complains about chronic back and bilateral hip pain. The patient also complains about ankle and shoulder pain. The problem has been stable, it just was worse with the cold and damp weather, back to baseline today.   Pain Inventory Average Pain 5 Pain Right Now 4 My pain is constant, sharp, burning, dull, stabbing, tingling and aching  In the last 24 hours, has pain interfered with the following? General activity 5 Relation with others 5 Enjoyment of life 5 What TIME of day is your pain at its worst? evening Sleep (in general) Fair  Pain is worse with: some activites Pain improves with: pacing activities and medication Relief from Meds: 6  Mobility use a cane how many minutes can you walk? 15-30 ability to climb steps?  yes do you drive?  yes Do you have any goals in this area?  yes  Function disabled: date disabled 04 Do you have any goals in this area?  yes  Neuro/Psych tingling trouble walking  Prior Studies Any changes since last visit?  no  Physicians involved in your care Any changes since last visit?  no   History reviewed. No pertinent family history. History   Social History  . Marital Status: Married    Spouse Name: N/A    Number of Children: N/A  . Years of Education: N/A   Social History Main Topics  . Smoking status: Never Smoker   . Smokeless tobacco: None  . Alcohol Use: None  . Drug Use: None  . Sexually Active: None   Other Topics Concern  . None   Social History Narrative  . None   Past Surgical History  Procedure Laterality Date  . Cortisone injection    . Small intestine surgery    . Spine surgery    . Neuroplasty / transposition median nerve at carpal tunnel bilateral    . Elbow surgery    . Rectal fissure repair    . Rotator cuff repair    . Stint placement     Past Medical History  Diagnosis Date  .  Ankylosing spondylitis of lumbar region   . Ankylosing spondylitis of cervical region   . Obesity   . Bursitis, trochanteric   . Rotator cuff syndrome   . Tendonitis   . Depression   . Anxiety   . Back pain   . Hypertension   . Diabetes mellitus    BP 142/77  Pulse 91  Resp 14  Ht 6\' 1"  (1.854 m)  Wt 261 lb (118.389 kg)  BMI 34.44 kg/m2  SpO2 98%     Review of Systems  HENT: Positive for neck pain.   Musculoskeletal: Positive for back pain and gait problem.  All other systems reviewed and are negative.       Objective:   Physical Exam  Constitutional: He is oriented to person, place, and time. He appears well-developed and well-nourished.  HENT:  Head: Normocephalic.  Musculoskeletal: He exhibits tenderness.  Neurological: He is alert and oriented to person, place, and time.  Skin: Skin is warm and dry.  Psychiatric: He has a normal mood and affect.  Symmetric normal motor tone is noted throughout. Normal muscle bulk. Muscle testing reveals 5/5 muscle strength of the upper extremity, and 5/5 of the lower extremity, except iliopsoas bilateral 4/5. Full range of motion in upper and lower extremities. ROM  of spine is restricted, autofused L-spine, C-spine rot. To the left 10 degrees, to the right 15 degrees, flex/ext, 10/5 degrees. Fine motor movements are normal in both hands.  Sensory is intact and symmetric to light touch, pinprick and proprioception.  DTR in the upper and lower extremity are present and symmetric 2+. No clonus is noted.  Patient arises from chair with difficulty. Wide based gait with normal arm swing bilateral , able to walk on heels and toes . Tandem walk is stable. No pronator drift. Rhomberg negative.        Assessment & Plan:  1. 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  2. Bilateral rotator cuff syndrome.  PLAN:  1. Continue with home exercise and stretching program. He understands the importance of regular exercise.  2.  Current medication regimen is still effective. Continue morphine  60 mg CR q.8 hours #270 and Norco 7.5, #270 with 3 month  prescriptions were written today.  3. He will see PA back in 3 months. All questions were encouraged and answered.

## 2012-03-25 NOTE — Patient Instructions (Signed)
Continue with your exercise program. Continue with your walking program.

## 2012-04-01 ENCOUNTER — Ambulatory Visit
Admission: RE | Admit: 2012-04-01 | Discharge: 2012-04-01 | Disposition: A | Discharge status: Home / Self Care | Payer: Medicare Other | Source: Ambulatory Visit | Attending: Internal Medicine | Admitting: Internal Medicine

## 2012-04-01 ENCOUNTER — Other Ambulatory Visit: Payer: Self-pay | Admitting: Internal Medicine

## 2012-04-01 DIAGNOSIS — K769 Liver disease, unspecified: Secondary | ICD-10-CM

## 2012-04-11 ENCOUNTER — Ambulatory Visit
Admission: RE | Admit: 2012-04-11 | Discharge: 2012-04-11 | Disposition: A | Discharge status: Home / Self Care | Payer: Medicare Other | Source: Ambulatory Visit | Attending: Internal Medicine | Admitting: Internal Medicine

## 2012-04-11 MED ORDER — GADOBENATE DIMEGLUMINE 529 MG/ML IV SOLN
20.0000 mL | Freq: Once | INTRAVENOUS | Status: AC | PRN
  Administered 2012-04-11: 20 mL via INTRAVENOUS

## 2012-06-20 ENCOUNTER — Encounter
Payer: Medicare Other | Attending: Physical Medicine and Rehabilitation | Admitting: Physical Medicine and Rehabilitation

## 2012-06-20 ENCOUNTER — Encounter: Payer: Self-pay | Admitting: Physical Medicine and Rehabilitation

## 2012-06-20 VITALS — BP 129/76 | HR 70 | Resp 14 | Ht 73.0 in | Wt 260.0 lb

## 2012-06-20 DIAGNOSIS — M67919 Unspecified disorder of synovium and tendon, unspecified shoulder: Secondary | ICD-10-CM

## 2012-06-20 DIAGNOSIS — M76899 Other specified enthesopathies of unspecified lower limb, excluding foot: Secondary | ICD-10-CM

## 2012-06-20 DIAGNOSIS — M459 Ankylosing spondylitis of unspecified sites in spine: Secondary | ICD-10-CM | POA: Insufficient documentation

## 2012-06-20 DIAGNOSIS — G8929 Other chronic pain: Secondary | ICD-10-CM | POA: Insufficient documentation

## 2012-06-20 DIAGNOSIS — M719 Bursopathy, unspecified: Secondary | ICD-10-CM | POA: Insufficient documentation

## 2012-06-20 DIAGNOSIS — M25559 Pain in unspecified hip: Secondary | ICD-10-CM | POA: Insufficient documentation

## 2012-06-20 DIAGNOSIS — M25579 Pain in unspecified ankle and joints of unspecified foot: Secondary | ICD-10-CM | POA: Insufficient documentation

## 2012-06-20 DIAGNOSIS — M7060 Trochanteric bursitis, unspecified hip: Secondary | ICD-10-CM

## 2012-06-20 MED ORDER — HYDROCODONE-ACETAMINOPHEN 7.5-325 MG PO TABS: 1.0000 | ORAL_TABLET | Freq: Four times a day (QID) | ORAL | Status: DC | PRN

## 2012-06-20 MED ORDER — MORPHINE SULFATE ER 60 MG PO TBCR: 60.0000 mg | EXTENDED_RELEASE_TABLET | Freq: Three times a day (TID) | ORAL | Status: DC

## 2012-06-20 NOTE — Patient Instructions (Signed)
Continue with your walking and exercise program as pain allows.

## 2012-06-20 NOTE — Progress Notes (Signed)
Subjective:    Patient ID: Daniel Rivers, male    DOB: [birth year], 62 y.o.   MRN: [id number]  HPI The patient complains about chronic back and bilateral hip pain. The patient also complains about ankle and shoulder pain.  The problem has been stable, it just was worse with the cold and damp weather, back to baseline today.   Pain Inventory Average Pain 4 Pain Right Now 4 My pain is sharp, burning, dull, stabbing, tingling and aching  In the last 24 hours, has pain interfered with the following? General activity 4 Relation with others 2 Enjoyment of life 3 What TIME of day is your pain at its worst? evening Sleep (in general) Fair  Pain is worse with: walking, bending, sitting, inactivity, standing and some activites Pain improves with: rest, heat/ice, pacing activities and medication Relief from Meds: 5  Mobility use a cane how many minutes can you walk? 15-20 ability to climb steps?  yes do you drive?  yes Do you have any goals in this area?  no  Function disabled: date disabled 08/2002 Do you have any goals in this area?  no  Neuro/Psych weakness numbness tingling dizziness  Prior Studies Any changes since last visit?  no  Physicians involved in your care Any changes since last visit?  no   History reviewed. No pertinent family history. History   Social History  . Marital Status: Married    Spouse Name: N/A    Number of Children: N/A  . Years of Education: N/A   Social History Main Topics  . Smoking status: Never Smoker   . Smokeless tobacco: None  . Alcohol Use: None  . Drug Use: None  . Sexually Active: None   Other Topics Concern  . None   Social History Narrative  . None   Past Surgical History  Procedure Laterality Date  . Cortisone injection    . Small intestine surgery    . Spine surgery    . Neuroplasty / transposition median nerve at carpal tunnel bilateral    . Elbow surgery    . Rectal fissure repair    . Rotator cuff repair     . Stint placement     Past Medical History  Diagnosis Date  . Ankylosing spondylitis of lumbar region   . Ankylosing spondylitis of cervical region   . Obesity   . Bursitis, trochanteric   . Rotator cuff syndrome   . Tendonitis   . Depression   . Anxiety   . Back pain   . Hypertension   . Diabetes mellitus    BP 129/76  Pulse 70  Resp 14  Ht 6\' 1"  (1.854 m)  Wt 260 lb (117.935 kg)  BMI 34.31 kg/m2  SpO2 99%      Review of Systems  Neurological: Positive for dizziness, weakness and numbness.  All other systems reviewed and are negative.       Objective:   Physical Exam Constitutional: He is oriented to person, place, and time. He appears well-developed and well-nourished.  HENT:  Head: Normocephalic.  Musculoskeletal: He exhibits tenderness.  Neurological: He is alert and oriented to person, place, and time.  Skin: Skin is warm and dry.  Psychiatric: He has a normal mood and affect.  Symmetric normal motor tone is noted throughout. Normal muscle bulk. Muscle testing reveals 5/5 muscle strength of the upper extremity, and 5/5 of the lower extremity, except iliopsoas bilateral 4/5. Full range of motion in upper and lower extremities.  ROM of spine is restricted, autofused L-spine, C-spine rot. To the left 10 degrees, to the right 15 degrees, flex/ext, 10/5 degrees. Fine motor movements are normal in both hands.  Sensory is intact and symmetric to light touch, pinprick and proprioception.  DTR in the upper and lower extremity are present and symmetric 2+. No clonus is noted.  Patient arises from chair with difficulty. Wide based gait with normal arm swing bilateral , able to walk on heels and toes . Tandem walk is stable. No pronator drift. Rhomberg negative.        Assessment & Plan:  1. 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  2. Bilateral rotator cuff syndrome.  PLAN:  1. Continue with home exercise and stretching program. He understands the  importance of regular exercise.  2. Current medication regimen is still effective. Continue morphine  60 mg CR q.8 hours #270 and Norco 7.5, #270 with 3 month  prescriptions were written today.  3. He will see PA back in 3 months. All questions were encouraged and answered

## 2012-06-29 ENCOUNTER — Other Ambulatory Visit: Payer: Self-pay | Admitting: Physical Medicine & Rehabilitation

## 2012-07-08 ENCOUNTER — Telehealth: Payer: Self-pay

## 2012-07-08 MED ORDER — POLYETHYLENE GLYCOL 3350 17 GM/SCOOP PO POWD: ORAL | Status: DC

## 2012-07-08 NOTE — Telephone Encounter (Signed)
Patient called requesting polyglycol [facility]ill [facility].  Spoke with [facility] this was filled.  Patient aware.

## 2012-07-28 ENCOUNTER — Other Ambulatory Visit: Payer: Self-pay

## 2012-07-28 DIAGNOSIS — E349 Endocrine disorder, unspecified: Secondary | ICD-10-CM

## 2012-07-28 MED ORDER — NONFORMULARY OR COMPOUNDED ITEM: Status: DC

## 2012-08-17 ENCOUNTER — Telehealth: Payer: Self-pay | Admitting: Oncology

## 2012-08-17 NOTE — Telephone Encounter (Signed)
C/D 08/17/12 for appt. 09/21/12

## 2012-08-17 NOTE — Telephone Encounter (Signed)
Pt scheduled to see Dr.Shadad 08/20 @ 1:30. Referring Dr. Jeani Hawking Dx- Hemochromatosis Welcome packet mailed.    Referring office is aware of NP appt.

## 2012-08-18 ENCOUNTER — Telehealth: Payer: Self-pay | Admitting: Oncology

## 2012-08-18 NOTE — Telephone Encounter (Signed)
PT CALLED TO CHANGE APPT TO MORNING 08/20 @ 10:30 W/DR. SHADAD.

## 2012-09-13 ENCOUNTER — Telehealth: Payer: Self-pay

## 2012-09-13 NOTE — Telephone Encounter (Signed)
Patient request glimepiride refill [facility].  Please advise.

## 2012-09-16 ENCOUNTER — Encounter
Payer: Medicare Other | Attending: Physical Medicine and Rehabilitation | Admitting: Physical Medicine and Rehabilitation

## 2012-09-16 ENCOUNTER — Other Ambulatory Visit: Payer: Self-pay | Admitting: Oncology

## 2012-09-16 ENCOUNTER — Encounter: Payer: Self-pay | Admitting: Physical Medicine and Rehabilitation

## 2012-09-16 VITALS — BP 124/71 | HR 73 | Resp 14 | Ht 75.0 in | Wt 260.0 lb

## 2012-09-16 DIAGNOSIS — M7060 Trochanteric bursitis, unspecified hip: Secondary | ICD-10-CM

## 2012-09-16 DIAGNOSIS — M25579 Pain in unspecified ankle and joints of unspecified foot: Secondary | ICD-10-CM | POA: Insufficient documentation

## 2012-09-16 DIAGNOSIS — Z862 Personal history of diseases of the blood and blood-forming organs and certain disorders involving the immune mechanism: Secondary | ICD-10-CM

## 2012-09-16 DIAGNOSIS — M549 Dorsalgia, unspecified: Secondary | ICD-10-CM | POA: Insufficient documentation

## 2012-09-16 DIAGNOSIS — G8929 Other chronic pain: Secondary | ICD-10-CM | POA: Insufficient documentation

## 2012-09-16 DIAGNOSIS — M25559 Pain in unspecified hip: Secondary | ICD-10-CM | POA: Insufficient documentation

## 2012-09-16 DIAGNOSIS — M25519 Pain in unspecified shoulder: Secondary | ICD-10-CM | POA: Insufficient documentation

## 2012-09-16 DIAGNOSIS — M76899 Other specified enthesopathies of unspecified lower limb, excluding foot: Secondary | ICD-10-CM

## 2012-09-16 DIAGNOSIS — M67919 Unspecified disorder of synovium and tendon, unspecified shoulder: Secondary | ICD-10-CM | POA: Insufficient documentation

## 2012-09-16 DIAGNOSIS — M719 Bursopathy, unspecified: Secondary | ICD-10-CM | POA: Insufficient documentation

## 2012-09-16 DIAGNOSIS — E119 Type 2 diabetes mellitus without complications: Secondary | ICD-10-CM | POA: Insufficient documentation

## 2012-09-16 DIAGNOSIS — Z79899 Other long term (current) drug therapy: Secondary | ICD-10-CM

## 2012-09-16 DIAGNOSIS — Z5181 Encounter for therapeutic drug level monitoring: Secondary | ICD-10-CM

## 2012-09-16 DIAGNOSIS — I10 Essential (primary) hypertension: Secondary | ICD-10-CM | POA: Insufficient documentation

## 2012-09-16 DIAGNOSIS — M459 Ankylosing spondylitis of unspecified sites in spine: Secondary | ICD-10-CM

## 2012-09-16 MED ORDER — MORPHINE SULFATE ER 60 MG PO TBCR: 60.0000 mg | EXTENDED_RELEASE_TABLET | Freq: Three times a day (TID) | ORAL | Status: DC

## 2012-09-16 MED ORDER — HYDROCODONE-ACETAMINOPHEN 7.5-325 MG PO TABS: 1.0000 | ORAL_TABLET | Freq: Four times a day (QID) | ORAL | Status: DC | PRN

## 2012-09-16 NOTE — Patient Instructions (Addendum)
Try to stay as active as tolerated.Continue with your exercise and walking program

## 2012-09-16 NOTE — Progress Notes (Signed)
Subjective:    Patient ID: Daniel Rivers, male    DOB: [birth year], 62 y.o.   MRN: [id number]  HPI The patient complains about chronic back and bilateral hip pain. The patient also complains about ankle and shoulder pain.  The problem has been stable. No new medical isues, patient mainly here for med refill.  Pain Inventory Average Pain 5 Pain Right Now 7 My pain is intermittent, constant, sharp, burning, dull, stabbing, tingling and aching  In the last 24 hours, has pain interfered with the following? General activity 9 Relation with others 5 Enjoyment of life 10 What TIME of day is your pain at its worst? evening Sleep (in general) Good  Pain is worse with: bending, sitting, inactivity, standing and some activites Pain improves with: rest, heat/ice, pacing activities and medication Relief from Meds: 5  Mobility walk with assistance use a cane how many minutes can you walk? 15-20 ability to climb steps?  yes do you drive?  yes Do you have any goals in this area?  no  Function Do you have any goals in this area?  no  Neuro/Psych weakness numbness tingling trouble walking confusion  Prior Studies Any changes since last visit?  no  Physicians involved in your care Any changes since last visit?  no   History reviewed. No pertinent family history. History   Social History  . Marital Status: Married    Spouse Name: N/A    Number of Children: N/A  . Years of Education: N/A   Social History Main Topics  . Smoking status: Never Smoker   . Smokeless tobacco: None  . Alcohol Use: None  . Drug Use: None  . Sexual Activity: None   Other Topics Concern  . None   Social History Narrative  . None   Past Surgical History  Procedure Laterality Date  . Cortisone injection    . Small intestine surgery    . Spine surgery    . Neuroplasty / transposition median nerve at carpal tunnel bilateral    . Elbow surgery    . Rectal fissure repair    . Rotator cuff  repair    . Stint placement     Past Medical History  Diagnosis Date  . Ankylosing spondylitis of lumbar region   . Ankylosing spondylitis of cervical region   . Obesity   . Bursitis, trochanteric   . Rotator cuff syndrome   . Tendonitis   . Depression   . Anxiety   . Back pain   . Hypertension   . Diabetes mellitus    BP 124/71  Pulse 73  Resp 14  Ht 6\' 3"  (1.905 m)  Wt 260 lb (117.935 kg)  BMI 32.5 kg/m2  SpO2 98%    Review of Systems  Constitutional: Positive for diaphoresis and appetite change.  Cardiovascular: Positive for leg swelling.  Gastrointestinal: Positive for constipation.  Musculoskeletal: Positive for gait problem.  Neurological: Positive for weakness and numbness.       Tingling  Psychiatric/Behavioral: Positive for confusion.  All other systems reviewed and are negative.       Objective:   Physical Exam Constitutional: He is oriented to person, place, and time. He appears well-developed and well-nourished.  HENT:  Head: Normocephalic.  Musculoskeletal: He exhibits tenderness.  Neurological: He is alert and oriented to person, place, and time.  Skin: Skin is warm and dry.  Psychiatric: He has a normal mood and affect.  Symmetric normal motor tone is noted throughout. Normal  muscle bulk. Muscle testing reveals 5/5 muscle strength of the upper extremity, and 5/5 of the lower extremity, except iliopsoas bilateral 4/5. Full range of motion in upper and lower extremities. ROM of spine is restricted, autofused L-spine, C-spine rot. To the left 10 degrees, to the right 15 degrees, flex/ext, 10/5 degrees. Fine motor movements are normal in both hands.  Sensory is intact and symmetric to light touch, pinprick and proprioception.  DTR in the upper and lower extremity are present and symmetric 2+. No clonus is noted.  Patient arises from chair with difficulty. Wide based gait with normal arm swing bilateral , able to walk on heels and toes . Tandem walk is  stable. No pronator drift. Rhomberg negative.        Assessment & Plan:  1. 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  2. Bilateral rotator cuff syndrome.  PLAN:  1. Continue with home exercise and stretching program. He understands the importance of regular exercise.  2. Current medication regimen is still effective. Continue morphine  60 mg CR q.8 hours #270 and Norco 7.5, #270 with 3 month  prescriptions were written today.  3. He will see PA back in 3 months. All questions were encouraged and answered

## 2012-09-16 NOTE — Telephone Encounter (Signed)
This is a DM medication, we do not prescribe this as far as I know, he should contact his PCP

## 2012-09-16 NOTE — Addendum Note (Signed)
Addended by: Doreene Eland on: 09/16/2012 10:21 AM   Modules accepted: Orders

## 2012-09-19 ENCOUNTER — Ambulatory Visit: Payer: Medicare Other | Admitting: Physical Medicine and Rehabilitation

## 2012-09-21 ENCOUNTER — Other Ambulatory Visit: Payer: Medicare Other | Admitting: Lab

## 2012-09-21 ENCOUNTER — Ambulatory Visit: Payer: Medicare Other

## 2012-09-21 ENCOUNTER — Encounter: Payer: Self-pay | Admitting: Oncology

## 2012-09-21 ENCOUNTER — Telehealth: Payer: Self-pay | Admitting: Oncology

## 2012-09-21 ENCOUNTER — Telehealth: Payer: Self-pay | Admitting: *Deleted

## 2012-09-21 ENCOUNTER — Ambulatory Visit (HOSPITAL_BASED_OUTPATIENT_CLINIC_OR_DEPARTMENT_OTHER): Payer: Medicare Other

## 2012-09-21 ENCOUNTER — Ambulatory Visit (HOSPITAL_BASED_OUTPATIENT_CLINIC_OR_DEPARTMENT_OTHER): Payer: Medicare Other | Admitting: Oncology

## 2012-09-21 ENCOUNTER — Other Ambulatory Visit (HOSPITAL_BASED_OUTPATIENT_CLINIC_OR_DEPARTMENT_OTHER): Payer: Medicare Other | Admitting: Lab

## 2012-09-21 ENCOUNTER — Ambulatory Visit: Payer: Medicare Other | Admitting: Oncology

## 2012-09-21 VITALS — BP 118/71 | HR 96 | Temp 98.5°F | Resp 18 | Ht 75.0 in | Wt 257.7 lb

## 2012-09-21 DIAGNOSIS — Z862 Personal history of diseases of the blood and blood-forming organs and certain disorders involving the immune mechanism: Secondary | ICD-10-CM

## 2012-09-21 LAB — COMPREHENSIVE METABOLIC PANEL (CC13)
ALT: 52 U/L (ref 0–55)
AST: 48 U/L — ABNORMAL HIGH (ref 5–34)
Albumin: 3.7 g/dL (ref 3.5–5.0)
BUN: 14 mg/dL (ref 7.0–26.0)
CO2: 26 mEq/L (ref 22–29)
Calcium: 9.4 mg/dL (ref 8.4–10.4)
Chloride: 104 mEq/L (ref 98–109)
Creatinine: 0.9 mg/dL (ref 0.7–1.3)
Potassium: 4 mEq/L (ref 3.5–5.1)

## 2012-09-21 LAB — CBC WITH DIFFERENTIAL/PLATELET
BASO%: 1 % (ref 0.0–2.0)
Basophils Absolute: 0 10*3/uL (ref 0.0–0.1)
EOS%: 2.7 % (ref 0.0–7.0)
HCT: 41.7 % (ref 38.4–49.9)
HGB: 14.4 g/dL (ref 13.0–17.1)
MONO#: 0.3 10*3/uL (ref 0.1–0.9)
NEUT#: 2.6 10*3/uL (ref 1.5–6.5)
NEUT%: 60.8 % (ref 39.0–75.0)
RDW: 13.2 % (ref 11.0–14.6)
WBC: 4.3 10*3/uL (ref 4.0–10.3)
lymph#: 1.2 10*3/uL (ref 0.9–3.3)

## 2012-09-21 LAB — IRON AND TIBC CHCC
%SAT: 41 % (ref 20–55)
Iron: 133 ug/dL (ref 42–163)

## 2012-09-21 NOTE — Telephone Encounter (Signed)
gv and printed appt sched and avs for pt...emaield MW to add tx.   °

## 2012-09-21 NOTE — Progress Notes (Signed)
Checked in new pt with no financial concerns. °

## 2012-09-21 NOTE — Progress Notes (Signed)
Reason for Referral: Hemachromatosis   HPI: This is a pleasant 62 year old gentleman of [place] referred to me for the evaluation of hemochromatosis. He is a gentleman with past medical history significant for diabetes and coronary artery disease and most recently found to have cirrhosis of the liver. March of 2014 he had an MRI of the abdomen which confirmed the presence of early hepatic cirrhosis without any suspicious liver lesions. He was evaluated by Dr. Elnoria Rivers and recommended phlebotomies to decrease his iron load. Patient reports he have been diagnosed with hemochromatosis about 5 years ago and was  Receiving intermittent phlebotomies under the care of his primary care physician initially every 2 weeks and subsequently every 6 months and these were discontinued altogether due to normalization of his ferritin level.  Clinically, [name] is asymptomatic at this point. He does report some occasional abdominal discomfort but for the most part this has been rather minimal. Is not reporting any nausea or vomiting. Has not reported any changes in his bowel habits. Has not reported any bleeding or clotting tendencies. Has not reported any chest pain or shortness of breath. Has not reported any neurological symptoms or deterioration in his performance status. He is continued to be active and walks on a regular basis and have been relatively stable from a health standpoint.   Past Medical History  Diagnosis Date  . Ankylosing spondylitis of lumbar region   . Ankylosing spondylitis of cervical region   . Obesity   . Bursitis, trochanteric   . Rotator cuff syndrome   . Tendonitis   . Depression   . Anxiety   . Back pain   . Hypertension   . Diabetes mellitus   :  Past Surgical History  Procedure Laterality Date  . Cortisone injection    . Small intestine surgery    . Spine surgery    . Neuroplasty / transposition median nerve at carpal tunnel bilateral    . Elbow surgery    . Rectal  fissure repair    . Rotator cuff repair    . Stint placement    :   Current Outpatient Prescriptions  Medication Sig Dispense Refill  . ACCU-CHEK FASTCLIX LANCETS MISC       . aspirin 81 MG tablet Take 160 mg by mouth daily.      . bisacodyl (DULCOLAX) 5 MG EC tablet Take 5 mg by mouth at bedtime. 2 at bedtime      . carvedilol (COREG) 3.125 MG tablet Take 3.125 mg by mouth 2 (two) times daily.      . Cholecalciferol (VITAMIN D3) 2000 UNITS TABS Take 1 tablet by mouth daily.      [name] glimepiride (AMARYL) 2 MG tablet Take 2 mg by mouth daily before breakfast. Takes 1/2 every morning      . HYDROcodone-acetaminophen (NORCO) 7.5-325 MG per tablet Take 1 tablet by mouth every 6 (six) hours as needed. 3 month supply  360 tablet  0  . KRILL OIL PO Take by mouth.      . losartan (COZAAR) 100 MG tablet Take 100 mg by mouth daily.      . Magnesium Oxide 400 MG CAPS Take 1 capsule by mouth daily.      . meloxicam (MOBIC) 7.5 MG tablet Take 7.5 mg by mouth as needed. 3 month supply      . metFORMIN (GLUCOPHAGE) 1000 MG tablet Take 1,000 mg by mouth every 12 (twelve) hours.      [name] morphine (MS CONTIN)  60 MG 12 hr tablet Take 1 tablet (60 mg total) by mouth 3 (three) times daily. 3 month supply  270 tablet  0  . Multiple Vitamin (MULTIVITAMIN) capsule Take 1 capsule by mouth daily. NO IRON      . NITROSTAT 0.4 MG SL tablet       . NONFORMULARY OR COMPOUNDED ITEM TESTOSTERONE GEL 10%. APPLY DAILY TO SKIN.  30 each  6  . polyethylene glycol powder (GLYCOLAX/MIRALAX) powder Using measuring cap mix  17gm in water and drink two times daily  3162 g  11  . Psyllium (METAMUCIL PO) Take 17 g/day by mouth 2 (two) times daily.      . simvastatin (ZOCOR) 40 MG tablet Take 40 mg by mouth every evening. 1/4 TAB AT BEDTIME      . zolpidem (AMBIEN) 10 MG tablet Take 10 mg by mouth at bedtime as needed.       No current facility-administered medications for this visit.      Allergies  Allergen Reactions  .  Amlodipine Besy-Benazepril Hcl   . Naproxen   :  No family history on file.:  History   Social History  . Marital Status: Married    Spouse Name: N/A    Number of Children: N/A  . Years of Education: N/A   Occupational History  . Not on file.   Social History Main Topics  . Smoking status: Never Smoker   . Smokeless tobacco: Not on file  . Alcohol Use: Not on file  . Drug Use: Not on file  . Sexual Activity: Not on file   Other Topics Concern  . Not on file   Social History Narrative  . No narrative on file  :  A comprehensive review of systems was negative.  Exam:ECOG 1 Blood pressure 118/71, pulse 96, temperature 98.5 F (36.9 C), temperature source Oral, resp. rate 18, height 6\' 3"  (1.905 m), weight 257 lb 11.2 oz (116.892 kg). General appearance: alert, cooperative and appears stated age Head: Normocephalic, without obvious abnormality, atraumatic Throat: lips, mucosa, and tongue normal; teeth and gums normal Neck: no adenopathy, no carotid bruit, no JVD, supple, symmetrical, trachea midline and thyroid not enlarged, symmetric, no tenderness/mass/nodules Back: negative Resp: clear to auscultation bilaterally Chest wall: no tenderness Cardio: regular rate and rhythm, S1, S2 normal, no murmur, click, rub or gallop GI: soft, non-tender; bowel sounds normal; no masses,  no organomegaly Extremities: extremities normal, atraumatic, no cyanosis or edema Pulses: 2+ and symmetric   Recent Labs  09/21/12 1052  WBC 4.3  HGB 14.4  HCT 41.7  PLT 116*    Recent Labs  09/21/12 1052  NA 140  K 4.0  CO2 26  GLUCOSE 156*  BUN 14.0  CREATININE 0.9  CALCIUM 9.4    Assessment and Plan:    62 year old gentleman with:   1. History of hemochromatosis although the exact a genotype is not available to me at this point. It is unclear to me whether he has a homozygous or heterozygous genetic mutation. His most recent ferritin have ranged between 100-300. And I am  repeating his ferritin levels today. The natural course of hemochromatosis was discussed today and giving his early cirrhosis of the liver I discussed the role therapeutic phlebotomy to assess with decreasing that risk moving forward. The risks and benefits of this approach were discussed and he is very familiar with phlebotomies and willing to proceed. I told him that we will start with phlebotomy on a  monthly basis and aim for a ferritin of less than 50 based on Dr. Haywood Pao recommendations. And we will adjust the schedule accordingly.  2. Thrombocytopenia: Throat count is 116 today and likely related to his liver cirrhosis we'll continue to observe this at this time.

## 2012-09-21 NOTE — Telephone Encounter (Signed)
Per staff message and POF I have scheduled appts.  JMW  

## 2012-09-28 ENCOUNTER — Other Ambulatory Visit: Payer: Self-pay | Admitting: Oncology

## 2012-09-28 ENCOUNTER — Encounter: Payer: Self-pay | Admitting: *Deleted

## 2012-09-28 ENCOUNTER — Other Ambulatory Visit (HOSPITAL_BASED_OUTPATIENT_CLINIC_OR_DEPARTMENT_OTHER): Payer: Medicare Other | Admitting: Lab

## 2012-09-28 ENCOUNTER — Ambulatory Visit (HOSPITAL_BASED_OUTPATIENT_CLINIC_OR_DEPARTMENT_OTHER): Payer: Medicare Other

## 2012-09-28 VITALS — BP 111/70 | HR 80 | Temp 97.3°F | Resp 16

## 2012-09-28 DIAGNOSIS — Z862 Personal history of diseases of the blood and blood-forming organs and certain disorders involving the immune mechanism: Secondary | ICD-10-CM

## 2012-09-28 LAB — CBC WITH DIFFERENTIAL/PLATELET
BASO%: 0.5 % (ref 0.0–2.0)
HCT: 41.6 % (ref 38.4–49.9)
MCHC: 34.6 g/dL (ref 32.0–36.0)
MONO#: 0.4 10*3/uL (ref 0.1–0.9)
RBC: 4.68 10*6/uL (ref 4.20–5.82)
RDW: 13 % (ref 11.0–14.6)
lymph#: 1.4 10*3/uL (ref 0.9–3.3)
nRBC: 0 % (ref 0–0)

## 2012-09-28 NOTE — Progress Notes (Signed)
845-9:05  500 gm phlebotomized through 18 g to rt AC. HCT on 09/28/12 41.6.  Pt tolerated well. Pt drinking 12 oz coffee. Pt observed post phlebotomy for 30 min.  V/S WNL. Pt denies complaints or needs at this time. Pt discharged alert and ambulatory.

## 2012-09-28 NOTE — Patient Instructions (Addendum)
Therapeutic Phlebotomy Therapeutic phlebotomy is the controlled removal of blood from your body for the purpose of treating a medical condition. It is similar to donating blood. Usually, about a pint (470 mL) of blood is removed. The average adult has 9 to 12 pints (4.3 to 5.7 L) of blood. Therapeutic phlebotomy may be used to treat the following medical conditions:  Hemochromatosis. This is a condition in which there is too much iron in the blood.  Polycythemia vera. This is a condition in which there are too many red cells in the blood.  Porphyria cutanea tarda. This is a disease usually passed from one generation to the next (inherited). It is a condition in which an important part of hemoglobin is not made properly. This results in the build up of abnormal amounts of porphyrins in the body.  Sickle cell disease. This is an inherited disease. It is a condition in which the red blood cells form an abnormal crescent shape rather than a round shape. LET YOUR CAREGIVER KNOW ABOUT:  Allergies.  Medicines taken including herbs, eyedrops, over-the-counter medicines, and creams.  Use of steroids (by mouth or creams).  Previous problems with anesthetics or numbing medicine.  History of blood clots.  History of bleeding or blood problems.  Previous surgery.  Possibility of pregnancy, if this applies. RISKS AND COMPLICATIONS This is a simple and safe procedure. Problems are unlikely. However, problems can occur and may include:  Nausea or lightheadedness.  Low blood pressure.  Soreness, bleeding, swelling, or bruising at the needle insertion site.  Infection. BEFORE THE PROCEDURE  This is a procedure that can be done as an outpatient. Confirm the time that you need to arrive for your procedure. Confirm whether there is a need to fast or withhold any medications. It is helpful to wear clothing with sleeves that can be raised above the elbow. A blood sample may be done to determine the  amount of red blood cells or iron in your blood. Plan ahead of time to have someone drive you home after the procedure. PROCEDURE The entire procedure from preparation through recovery takes about 1 hour. The actual collection takes about 10 to 15 minutes.  A needle will be inserted into your vein.  Tubing and a collection bag will be attached to that needle.  Blood will flow through the needle and tubing into the collection bag.  You may be asked to open and close your hand slowly and continuously during the entire collection.  Once the specified amount of blood has been removed from your body, the collection bag and tubing will be clamped.  The needle will be removed.  Pressure will be held on the site of the needle insertion to stop the bleeding. Then a bandage will be placed over the needle insertion site. AFTER THE PROCEDURE  Your recovery will be assessed and monitored. If there are no problems, as an outpatient, you should be able to go home shortly after the procedure.  Document Released: 06/23/2010 Document Revised: 04/13/2011 Document Reviewed: 06/23/2010 [employer] Patient Information 2014 [employer].  

## 2012-09-28 NOTE — Progress Notes (Unsigned)
Patient in lobby today to request his o.v. Notes are not being sent to his primary care physician, dr richard payne. Will fax these today.

## 2012-10-10 ENCOUNTER — Telehealth: Payer: Self-pay

## 2012-10-10 NOTE — Telephone Encounter (Signed)
Patient has question about his morphine RX

## 2012-10-11 NOTE — Telephone Encounter (Signed)
Left VM for pt to [facility]

## 2012-10-12 NOTE — Telephone Encounter (Signed)
Mr Daniel Rivers called back and his question was, because he has been getting 90 day supplies on his pain meds, he is wanting to know why he only received 30 day supply of his MS Contin.  I told him it is our policy to only write for 30 days rx on narcotics (CIIs inparticular) but he says Daniel Rivers has been doing this for him for 10 years.  I told him his 09/16/12  rx was written for 90 days MS CContin #270 and he would need to speak with his pharmacy first to see what they say about why he received only #90. He agreed to call them.

## 2012-10-19 ENCOUNTER — Other Ambulatory Visit: Payer: Medicare Other | Admitting: Lab

## 2012-10-21 ENCOUNTER — Telehealth: Payer: Self-pay

## 2012-10-21 NOTE — Telephone Encounter (Signed)
Patient wants someone to return his call. He has questions about the way the Pharmacy is filling his prescriptions.

## 2012-10-21 NOTE — Telephone Encounter (Signed)
I spoke with Daniel Rivers.He has been getting his narc rxs for 90 days from his mail order pharmacy.  They now have to comply with the regulations from the state the rx originates and [place] only allows 30 day rx which is why he only received the 30 day supply.  He will call back around Oct 3 for [place] to print a new 30 day rx.  He comes q 3 months so will be picking up the 2 months in between visits instead of getting mail order.

## 2012-10-25 ENCOUNTER — Other Ambulatory Visit: Payer: Self-pay | Admitting: Oncology

## 2012-10-25 DIAGNOSIS — Z862 Personal history of diseases of the blood and blood-forming organs and certain disorders involving the immune mechanism: Secondary | ICD-10-CM

## 2012-10-26 ENCOUNTER — Ambulatory Visit (HOSPITAL_BASED_OUTPATIENT_CLINIC_OR_DEPARTMENT_OTHER): Payer: Medicare Other

## 2012-10-26 ENCOUNTER — Other Ambulatory Visit (HOSPITAL_BASED_OUTPATIENT_CLINIC_OR_DEPARTMENT_OTHER): Payer: Medicare Other | Admitting: Lab

## 2012-10-26 VITALS — BP 110/71 | HR 85 | Temp 98.1°F

## 2012-10-26 DIAGNOSIS — Z862 Personal history of diseases of the blood and blood-forming organs and certain disorders involving the immune mechanism: Secondary | ICD-10-CM

## 2012-10-26 LAB — CBC WITH DIFFERENTIAL/PLATELET
Basophils Absolute: 0 10*3/uL (ref 0.0–0.1)
Eosinophils Absolute: 0.1 10*3/uL (ref 0.0–0.5)
HGB: 14.1 g/dL (ref 13.0–17.1)
NEUT#: 2.4 10*3/uL (ref 1.5–6.5)
RDW: 12.8 % (ref 11.0–14.6)
WBC: 4.2 10*3/uL (ref 4.0–10.3)
lymph#: 1.2 10*3/uL (ref 0.9–3.3)

## 2012-10-26 NOTE — Progress Notes (Signed)
Phlebotomy for 500 gm from left antecubital per order.  Tol well.  Refreshments offered & pt d/c'd to home.

## 2012-11-09 ENCOUNTER — Telehealth: Payer: Self-pay

## 2012-11-09 DIAGNOSIS — M67919 Unspecified disorder of synovium and tendon, unspecified shoulder: Secondary | ICD-10-CM

## 2012-11-09 DIAGNOSIS — M459 Ankylosing spondylitis of unspecified sites in spine: Secondary | ICD-10-CM

## 2012-11-09 MED ORDER — MORPHINE SULFATE ER 60 MG PO TBCR: 60.0000 mg | EXTENDED_RELEASE_TABLET | Freq: Three times a day (TID) | ORAL | Status: DC

## 2012-11-09 NOTE — Telephone Encounter (Signed)
Called patient to let him know that his RX was ready for pickup.

## 2012-11-09 NOTE — Telephone Encounter (Signed)
See previous phone message 10/21/12.  Even though he was given 3 mo supply of Morphine, mail order pharmacy will only fill one month supplies from now on.  New one month supply rx printed for Daniel Rivers to sign.

## 2012-11-09 NOTE — Telephone Encounter (Signed)
Patient requesting a refill on MS Contin.

## 2012-11-16 ENCOUNTER — Other Ambulatory Visit: Payer: Medicare Other | Admitting: Lab

## 2012-11-19 ENCOUNTER — Other Ambulatory Visit: Payer: Self-pay | Admitting: Physical Medicine and Rehabilitation

## 2012-11-23 ENCOUNTER — Encounter (INDEPENDENT_AMBULATORY_CARE_PROVIDER_SITE_OTHER): Payer: Self-pay

## 2012-11-23 ENCOUNTER — Ambulatory Visit (HOSPITAL_BASED_OUTPATIENT_CLINIC_OR_DEPARTMENT_OTHER): Payer: Medicare Other

## 2012-11-23 ENCOUNTER — Other Ambulatory Visit (HOSPITAL_BASED_OUTPATIENT_CLINIC_OR_DEPARTMENT_OTHER): Payer: Medicare Other | Admitting: Lab

## 2012-11-23 DIAGNOSIS — Z862 Personal history of diseases of the blood and blood-forming organs and certain disorders involving the immune mechanism: Secondary | ICD-10-CM

## 2012-11-23 LAB — CBC WITH DIFFERENTIAL/PLATELET
Eosinophils Absolute: 0.1 10*3/uL (ref 0.0–0.5)
HCT: 41.5 % (ref 38.4–49.9)
LYMPH%: 34.4 % (ref 14.0–49.0)
MONO#: 0.4 10*3/uL (ref 0.1–0.9)
NEUT#: 2.2 10*3/uL (ref 1.5–6.5)
NEUT%: 52.8 % (ref 39.0–75.0)
Platelets: 117 10*3/uL — ABNORMAL LOW (ref 140–400)
RBC: 4.64 10*6/uL (ref 4.20–5.82)
WBC: 4.2 10*3/uL (ref 4.0–10.3)
lymph#: 1.5 10*3/uL (ref 0.9–3.3)
nRBC: 0 % (ref 0–0)

## 2012-11-23 NOTE — Progress Notes (Signed)
S/w Dr Clelia Croft, pt needs monthly phlebotomy, order obtained. Phlebotomized 495cc from 0836 to 0842 am. Pt tolerated well. 30 min. observation.

## 2012-11-23 NOTE — Patient Instructions (Signed)

## 2012-12-14 ENCOUNTER — Other Ambulatory Visit: Payer: Medicare Other | Admitting: Lab

## 2012-12-14 ENCOUNTER — Encounter: Payer: Self-pay | Admitting: Physical Medicine and Rehabilitation

## 2012-12-14 ENCOUNTER — Telehealth: Payer: Self-pay

## 2012-12-14 ENCOUNTER — Encounter
Payer: Medicare Other | Attending: Physical Medicine and Rehabilitation | Admitting: Physical Medicine and Rehabilitation

## 2012-12-14 VITALS — BP 128/70 | HR 89 | Resp 14 | Ht 75.0 in | Wt 264.0 lb

## 2012-12-14 DIAGNOSIS — M459 Ankylosing spondylitis of unspecified sites in spine: Secondary | ICD-10-CM

## 2012-12-14 DIAGNOSIS — M76899 Other specified enthesopathies of unspecified lower limb, excluding foot: Secondary | ICD-10-CM

## 2012-12-14 DIAGNOSIS — Z79899 Other long term (current) drug therapy: Secondary | ICD-10-CM | POA: Insufficient documentation

## 2012-12-14 DIAGNOSIS — M549 Dorsalgia, unspecified: Secondary | ICD-10-CM

## 2012-12-14 DIAGNOSIS — M7060 Trochanteric bursitis, unspecified hip: Secondary | ICD-10-CM

## 2012-12-14 DIAGNOSIS — G8929 Other chronic pain: Secondary | ICD-10-CM

## 2012-12-14 DIAGNOSIS — M719 Bursopathy, unspecified: Secondary | ICD-10-CM | POA: Insufficient documentation

## 2012-12-14 DIAGNOSIS — M67919 Unspecified disorder of synovium and tendon, unspecified shoulder: Secondary | ICD-10-CM | POA: Insufficient documentation

## 2012-12-14 MED ORDER — MORPHINE SULFATE ER 60 MG PO TBCR: 60.0000 mg | EXTENDED_RELEASE_TABLET | Freq: Three times a day (TID) | ORAL | Status: DC

## 2012-12-14 MED ORDER — HYDROCODONE-ACETAMINOPHEN 7.5-325 MG PO TABS: 1.0000 | ORAL_TABLET | Freq: Four times a day (QID) | ORAL | Status: DC | PRN

## 2012-12-14 NOTE — Telephone Encounter (Signed)
Patient called and says pharmacy will not fill hydrocodone for a 30 day supply.  New script printed.  Patient will bring old script and get new.

## 2012-12-14 NOTE — Progress Notes (Signed)
Subjective:    Patient ID: Daniel Rivers, male    DOB: 1950-06-21, 62 y.o.   MRN: [id number]  HPI The patient complains about chronic back and bilateral hip pain. The patient also complains about ankle and shoulder pain.  The problem has been stable. No new medical isues, patient mainly here for med refill.  Pain Inventory Average Pain 6 Pain Right Now 7 My pain is sharp, burning, dull, stabbing, tingling and aching  In the last 24 hours, has pain interfered with the following? General activity 7 Relation with others 7 Enjoyment of life 7 What TIME of day is your pain at its worst? daytime Sleep (in general) Fair  Pain is worse with: walking, bending, inactivity, standing and some activites Pain improves with: rest, heat/ice, therapy/exercise, pacing activities and medication Relief from Meds: 5  Mobility Do you have any goals in this area?  no  Function Do you have any goals in this area?  no  Neuro/Psych weakness numbness tingling trouble walking dizziness  Prior Studies Any changes since last visit?  no  Physicians involved in your care Any changes since last visit?  no   History reviewed. No pertinent family history. History   Social History  . Marital Status: Married    Spouse Name: N/A    Number of Children: N/A  . Years of Education: N/A   Social History Main Topics  . Smoking status: Never Smoker   . Smokeless tobacco: None  . Alcohol Use: None  . Drug Use: None  . Sexual Activity: None   Other Topics Concern  . None   Social History Narrative  . None   Past Surgical History  Procedure Laterality Date  . Cortisone injection    . Small intestine surgery    . Spine surgery    . Neuroplasty / transposition median nerve at carpal tunnel bilateral    . Elbow surgery    . Rectal fissure repair    . Rotator cuff repair    . Stint placement     Past Medical History  Diagnosis Date  . Ankylosing spondylitis of lumbar region   .  Ankylosing spondylitis of cervical region   . Obesity   . Bursitis, trochanteric   . Rotator cuff syndrome   . Tendonitis   . Depression   . Anxiety   . Back pain   . Hypertension   . Diabetes mellitus    BP 128/70  Pulse 89  Resp 14  Ht 6\' 3"  (1.905 m)  Wt 264 lb (119.75 kg)  BMI 33.00 kg/m2  SpO2 97%     Review of Systems  Musculoskeletal: Positive for back pain, gait problem and neck pain.  Neurological: Positive for dizziness, weakness and numbness.  All other systems reviewed and are negative.       Objective:   Physical Exam Constitutional: He is oriented to person, place, and time. He appears well-developed and well-nourished.  HENT:  Head: Normocephalic.  Musculoskeletal: He exhibits tenderness.  Neurological: He is alert and oriented to person, place, and time.  Skin: Skin is warm and dry.  Psychiatric: He has a normal mood and affect.  Symmetric normal motor tone is noted throughout. Normal muscle bulk. Muscle testing reveals 5/5 muscle strength of the upper extremity, and 5/5 of the lower extremity, except iliopsoas bilateral 4/5. Full range of motion in upper and lower extremities. ROM of spine is restricted, autofused L-spine, C-spine rot. To the left 10 degrees, to the right 15  degrees, flex/ext, 10/5 degrees. Fine motor movements are normal in both hands.  Sensory is intact and symmetric to light touch, pinprick and proprioception.  DTR in the upper and lower extremity are present and symmetric 2+. No clonus is noted.  Patient arises from chair with difficulty. Wide based gait with normal arm swing bilateral , able to walk on heels and toes . Tandem walk is stable. No pronator drift. Rhomberg negative.        Assessment & Plan:  1. 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  2. Bilateral rotator cuff syndrome.  PLAN:  1. Continue with home exercise and stretching program. He understands the importance of regular exercise.  2. Current  medication regimen is still effective. Continue morphine  60 mg CR q.8 hours #270 and Norco 7.5, #270 with 3 month  prescriptions were written today.  3. He will see PA back in 3 months. All questions were encouraged and answered

## 2012-12-14 NOTE — Patient Instructions (Signed)
Try to stay as active as tolerated 

## 2012-12-21 ENCOUNTER — Ambulatory Visit (HOSPITAL_BASED_OUTPATIENT_CLINIC_OR_DEPARTMENT_OTHER): Payer: Medicare Other

## 2012-12-21 ENCOUNTER — Other Ambulatory Visit (HOSPITAL_BASED_OUTPATIENT_CLINIC_OR_DEPARTMENT_OTHER): Payer: Medicare Other | Admitting: Lab

## 2012-12-21 VITALS — BP 130/75 | HR 80 | Temp 96.9°F | Resp 18

## 2012-12-21 DIAGNOSIS — Z862 Personal history of diseases of the blood and blood-forming organs and certain disorders involving the immune mechanism: Secondary | ICD-10-CM

## 2012-12-21 DIAGNOSIS — D696 Thrombocytopenia, unspecified: Secondary | ICD-10-CM

## 2012-12-21 LAB — CBC WITH DIFFERENTIAL/PLATELET
Basophils Absolute: 0 10*3/uL (ref 0.0–0.1)
EOS%: 1.7 % (ref 0.0–7.0)
Eosinophils Absolute: 0.1 10*3/uL (ref 0.0–0.5)
HCT: 42.7 % (ref 38.4–49.9)
HGB: 14.4 g/dL (ref 13.0–17.1)
MCH: 30.9 pg (ref 27.2–33.4)
MONO#: 0.4 10*3/uL (ref 0.1–0.9)
NEUT#: 2.8 10*3/uL (ref 1.5–6.5)
NEUT%: 60.4 % (ref 39.0–75.0)
lymph#: 1.3 10*3/uL (ref 0.9–3.3)

## 2012-12-21 NOTE — Patient Instructions (Signed)
Therapeutic Phlebotomy Therapeutic phlebotomy is the controlled removal of blood from your body for the purpose of treating a medical condition. It is similar to donating blood. Usually, about a pint (470 mL) of blood is removed. The average adult has 9 to 12 pints (4.3 to 5.7 L) of blood. Therapeutic phlebotomy may be used to treat the following medical conditions:  Hemochromatosis. This is a condition in which there is too much iron in the blood.  Polycythemia vera. This is a condition in which there are too many red cells in the blood.  Porphyria cutanea tarda. This is a disease usually passed from one generation to the next (inherited). It is a condition in which an important part of hemoglobin is not made properly. This results in the build up of abnormal amounts of porphyrins in the body.  Sickle cell disease. This is an inherited disease. It is a condition in which the red blood cells form an abnormal crescent shape rather than a round shape. LET YOUR CAREGIVER KNOW ABOUT:  Allergies.  Medicines taken including herbs, eyedrops, over-the-counter medicines, and creams.  Use of steroids (by mouth or creams).  Previous problems with anesthetics or numbing medicine.  History of blood clots.  History of bleeding or blood problems.  Previous surgery.  Possibility of pregnancy, if this applies. RISKS AND COMPLICATIONS This is a simple and safe procedure. Problems are unlikely. However, problems can occur and may include:  Nausea or lightheadedness.  Low blood pressure.  Soreness, bleeding, swelling, or bruising at the needle insertion site.  Infection. BEFORE THE PROCEDURE  This is a procedure that can be done as an outpatient. Confirm the time that you need to arrive for your procedure. Confirm whether there is a need to fast or withhold any medications. It is helpful to wear clothing with sleeves that can be raised above the elbow. A blood sample may be done to determine the  amount of red blood cells or iron in your blood. Plan ahead of time to have someone drive you home after the procedure. PROCEDURE The entire procedure from preparation through recovery takes about 1 hour. The actual collection takes about 10 to 15 minutes.  A needle will be inserted into your vein.  Tubing and a collection bag will be attached to that needle.  Blood will flow through the needle and tubing into the collection bag.  You may be asked to open and close your hand slowly and continuously during the entire collection.  Once the specified amount of blood has been removed from your body, the collection bag and tubing will be clamped.  The needle will be removed.  Pressure will be held on the site of the needle insertion to stop the bleeding. Then a bandage will be placed over the needle insertion site. AFTER THE PROCEDURE  Your recovery will be assessed and monitored. If there are no problems, as an outpatient, you should be able to go home shortly after the procedure.  Document Released: 06/23/2010 Document Revised: 04/13/2011 Document Reviewed: 06/23/2010 [employer] Patient Information 2014 [employer].  

## 2012-12-21 NOTE — Progress Notes (Signed)
Patient arrived for therapeutic phlebotomy; pre-procedure VS stable, patient ate breakfast prior to arrival. Phlebotomized 550g via left FA 18g from [id number]. Patient tolerated well. Food and drink provided.   Patient discharged to home after 30 minutes of monitoring. VSS in no apparent distress.

## 2013-01-11 ENCOUNTER — Ambulatory Visit: Payer: Medicare Other | Admitting: Oncology

## 2013-01-11 ENCOUNTER — Other Ambulatory Visit: Payer: Medicare Other | Admitting: Lab

## 2013-01-16 ENCOUNTER — Telehealth: Payer: Self-pay

## 2013-01-16 DIAGNOSIS — M67919 Unspecified disorder of synovium and tendon, unspecified shoulder: Secondary | ICD-10-CM

## 2013-01-16 DIAGNOSIS — M459 Ankylosing spondylitis of unspecified sites in spine: Secondary | ICD-10-CM

## 2013-01-16 MED ORDER — MORPHINE SULFATE ER 60 MG PO TBCR: 60.0000 mg | EXTENDED_RELEASE_TABLET | Freq: Three times a day (TID) | ORAL | Status: DC

## 2013-01-16 NOTE — Telephone Encounter (Signed)
Patient called requesting morphine refill.  Rx printed.  Patient aware it will be ready 01/17/13 in the afternoon.

## 2013-01-18 ENCOUNTER — Telehealth: Payer: Self-pay | Admitting: *Deleted

## 2013-01-18 ENCOUNTER — Ambulatory Visit (HOSPITAL_BASED_OUTPATIENT_CLINIC_OR_DEPARTMENT_OTHER): Payer: Medicare Other

## 2013-01-18 ENCOUNTER — Ambulatory Visit (HOSPITAL_BASED_OUTPATIENT_CLINIC_OR_DEPARTMENT_OTHER): Payer: Medicare Other | Admitting: Oncology

## 2013-01-18 ENCOUNTER — Other Ambulatory Visit (HOSPITAL_BASED_OUTPATIENT_CLINIC_OR_DEPARTMENT_OTHER): Payer: Medicare Other

## 2013-01-18 ENCOUNTER — Telehealth: Payer: Self-pay | Admitting: Oncology

## 2013-01-18 VITALS — BP 138/73 | HR 86 | Temp 98.0°F | Resp 20 | Wt 265.1 lb

## 2013-01-18 DIAGNOSIS — Z862 Personal history of diseases of the blood and blood-forming organs and certain disorders involving the immune mechanism: Secondary | ICD-10-CM

## 2013-01-18 LAB — CBC WITH DIFFERENTIAL/PLATELET
BASO%: 0.5 % (ref 0.0–2.0)
EOS%: 1.6 % (ref 0.0–7.0)
Eosinophils Absolute: 0.1 10*3/uL (ref 0.0–0.5)
HCT: 42.8 % (ref 38.4–49.9)
MCH: 30.6 pg (ref 27.2–33.4)
MCHC: 34.6 g/dL (ref 32.0–36.0)
MONO#: 0.4 10*3/uL (ref 0.1–0.9)
NEUT#: 2 10*3/uL (ref 1.5–6.5)
NEUT%: 53.3 % (ref 39.0–75.0)
lymph#: 1.3 10*3/uL (ref 0.9–3.3)

## 2013-01-18 NOTE — Telephone Encounter (Signed)
appts made per 12/17 POF AVS and CAL given shh °

## 2013-01-18 NOTE — Progress Notes (Signed)
Hematology and Oncology Follow Up Visit  Daniel Rivers [id number] 08-26-1950 62 y.o. 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 He established care at the [facility] in August of 2014.   Prior Therapy: He have been receiving intermittent phlebotomies in the past.  Current therapy: He have been receiving a monthly phlebotomies to achieve ferritin level of less than 50.  Interim History:  Mr. Knutzen presents today for a followup visit. He is a pleasant man I evaluated initially in August of 2014 after he had been recently diagnosed with cirrhosis of the liver likely secondary to hemochromatosis. He had been receiving monthly phlebotomy without any complications. He is not reported any fatigue or tiredness or difficulty breathing. He had not reported any recent illnesses or hospitalizations. His energy and performance status is about the same. He has not reported any chest pain or shortness of breath or difficulty breathing. Had not reported any GI or GU symptoms.  Medications: I have reviewed the patient's current medications.  Current Outpatient Prescriptions  Medication Sig Dispense Refill  . ACCU-CHEK FASTCLIX LANCETS MISC       . aspirin 81 MG tablet Take 160 mg by mouth daily.      . bisacodyl (DULCOLAX) 5 MG EC tablet Take 5 mg by mouth at bedtime. 2 at bedtime      . carvedilol (COREG) 3.125 MG tablet Take 3.125 mg by mouth 2 (two) times daily.      . Cholecalciferol (VITAMIN D3) 2000 UNITS TABS Take 1 tablet by mouth daily.      . furosemide (LASIX) 20 MG tablet       . glimepiride (AMARYL) 2 MG tablet Take 2 mg by mouth daily before breakfast. Takes 1/2 every morning      . HYDROcodone-acetaminophen (NORCO) 7.5-325 MG per tablet Take 1 tablet by mouth every 6 (six) hours as needed. 1 month supply  120 tablet  0  . KRILL OIL PO Take by mouth.      .  losartan (COZAAR) 100 MG tablet Take 100 mg by mouth daily.      . Magnesium Oxide 400 MG CAPS Take 1 capsule by mouth daily.      . meloxicam (MOBIC) 7.5 MG tablet Take 1 tablet by mouth  daily  90 tablet  1  . metFORMIN (GLUCOPHAGE) 1000 MG tablet Take 1,000 mg by mouth every 12 (twelve) hours.      [name] morphine (MS CONTIN) 60 MG 12 hr tablet Take 1 tablet (60 mg total) by mouth 3 (three) times daily.  90 tablet  0  . Multiple Vitamin (MULTIVITAMIN) capsule Take 1 capsule by mouth daily. NO IRON      . NITROSTAT 0.4 MG SL tablet       . NONFORMULARY OR COMPOUNDED ITEM TESTOSTERONE GEL 10%. APPLY DAILY TO SKIN.  30 each  6  . polyethylene glycol powder (GLYCOLAX/MIRALAX) powder Using measuring cap mix  17gm in water and drink two times daily  3162 g  11  . pravastatin (PRAVACHOL) 40 MG tablet       . Psyllium (METAMUCIL PO) Take 17 g/day by mouth 2 (two) times daily.      [name] zolpidem (AMBIEN) 10 MG tablet Take 10 mg by mouth at bedtime as needed.       No current facility-administered medications for this visit.     Allergies:  Allergies  Allergen Reactions  . Amlodipine Besy-Benazepril Hcl   . Naproxen     Past Medical History, Surgical history, Social history, and Family History were reviewed and updated.  Review of Systems:  Remaining ROS negative. Physical Exam: Blood pressure 138/73, pulse 86, temperature 98 F (36.7 C), temperature source Oral, resp. rate 20, weight 265 lb 2 oz (120.26 kg). ECOG: 1 General appearance: alert, cooperative and appears stated age Head: Normocephalic, without obvious abnormality, atraumatic Neck: no adenopathy, no JVD, supple, symmetrical, trachea midline and thyroid not enlarged, symmetric, no tenderness/mass/nodules Lymph nodes: Cervical, supraclavicular, and axillary nodes normal. Heart:regular rate and rhythm, S1, S2 normal, no murmur, click, rub or gallop Lung:chest clear, no wheezing, rales, normal symmetric air entry, Heart exam - S1,  S2 normal, no murmur, no gallop, rate regular Abdomin: soft, non-tender, without masses or organomegaly EXT:no erythema, induration, or nodules   Lab Results: Lab Results  Component Value Date   WBC 3.8* 01/18/2013   HGB 14.8 01/18/2013   HCT 42.8 01/18/2013   MCV 88.4 01/18/2013   PLT 110* 01/18/2013     Chemistry      Component Value Date/Time   NA 140 09/21/2012 1052   NA 138 04/20/2007 0825   K 4.0 09/21/2012 1052   K 3.9 04/20/2007 0825   CL 101 03/18/2007 0203   CO2 26 09/21/2012 1052   CO2 29 03/18/2007 0203   BUN 14.0 09/21/2012 1052   BUN 10 03/18/2007 0203   CREATININE 0.9 09/21/2012 1052   CREATININE 0.77 03/18/2007 0203      Component Value Date/Time   CALCIUM 9.4 09/21/2012 1052   CALCIUM 8.9 03/18/2007 0203   ALKPHOS 53 09/21/2012 1052   AST 48* 09/21/2012 1052   ALT 52 09/21/2012 1052   BILITOT 2.10* 09/21/2012 1052     Results for SHERRI, LEVENHAGEN (MRN [id number]) as of 01/18/2013 08:30  Ref. Range 12/21/2012 07:55  Ferritin Latest Range: 22.0-322.0 ng/mL 73    Impression and Plan:  62 year old gentleman with the following issues:  1. Hemachromatosis he is currently on phlebotomy to keep his ferritin less than 50. He has tolerated these phlebotomies on a monthly basis without any complications. His last ferritin was 73 which a an improvement from 281. The plan is to continue on monthly phlebotomies for the time being to get his ferritin below 50 and subsequently maintain him on every 3 months keep that level down.  2. Cirrhosis of the liver: He follows up with gastroenterology regarding that.  3. Thrombocytopenia: This is likely related to his cirrhosis of the liver no complications from that noted.   [facility], MD 12/17/20148:38 AM

## 2013-01-18 NOTE — Patient Instructions (Signed)

## 2013-01-18 NOTE — Telephone Encounter (Signed)
Per staff message and POF I have scheduled appts.  JMW  

## 2013-01-18 NOTE — Progress Notes (Signed)
Phlebotomy performed via right antecubital. 518 cc removed. Patient tolerated procedure well. Snack and drink given and patient observed for 30 minutes post procedure.

## 2013-02-22 ENCOUNTER — Ambulatory Visit (HOSPITAL_BASED_OUTPATIENT_CLINIC_OR_DEPARTMENT_OTHER): Payer: Medicare Other

## 2013-02-22 ENCOUNTER — Other Ambulatory Visit (HOSPITAL_BASED_OUTPATIENT_CLINIC_OR_DEPARTMENT_OTHER): Payer: Medicare Other

## 2013-02-22 VITALS — BP 111/63 | HR 79 | Temp 97.5°F | Resp 20

## 2013-02-22 DIAGNOSIS — Z862 Personal history of diseases of the blood and blood-forming organs and certain disorders involving the immune mechanism: Secondary | ICD-10-CM

## 2013-02-22 LAB — CBC WITH DIFFERENTIAL/PLATELET
BASO%: 0.5 % (ref 0.0–2.0)
BASOS ABS: 0 10*3/uL (ref 0.0–0.1)
EOS%: 1.5 % (ref 0.0–7.0)
Eosinophils Absolute: 0.1 10*3/uL (ref 0.0–0.5)
HCT: 41.3 % (ref 38.4–49.9)
HEMOGLOBIN: 14.3 g/dL (ref 13.0–17.1)
LYMPH#: 1.3 10*3/uL (ref 0.9–3.3)
LYMPH%: 30.7 % (ref 14.0–49.0)
MCH: 30.2 pg (ref 27.2–33.4)
MCHC: 34.6 g/dL (ref 32.0–36.0)
MCV: 87.1 fL (ref 79.3–98.0)
MONO#: 0.4 10*3/uL (ref 0.1–0.9)
MONO%: 9.8 % (ref 0.0–14.0)
NEUT#: 2.3 10*3/uL (ref 1.5–6.5)
NEUT%: 57.5 % (ref 39.0–75.0)
PLATELETS: 120 10*3/uL — AB (ref 140–400)
RBC: 4.74 10*6/uL (ref 4.20–5.82)
RDW: 12.7 % (ref 11.0–14.6)
WBC: 4.1 10*3/uL (ref 4.0–10.3)

## 2013-02-22 NOTE — Patient Instructions (Signed)
Therapeutic Phlebotomy Therapeutic phlebotomy is the controlled removal of blood from your body for the purpose of treating a medical condition. It is similar to donating blood. Usually, about a pint (470 mL) of blood is removed. The average adult has 9 to 12 pints (4.3 to 5.7 L) of blood. Therapeutic phlebotomy may be used to treat the following medical conditions:  Hemochromatosis. This is a condition in which there is too much iron in the blood.  Polycythemia vera. This is a condition in which there are too many red cells in the blood.  Porphyria cutanea tarda. This is a disease usually passed from one generation to the next (inherited). It is a condition in which an important part of hemoglobin is not made properly. This results in the build up of abnormal amounts of porphyrins in the body.  Sickle cell disease. This is an inherited disease. It is a condition in which the red blood cells form an abnormal crescent shape rather than a round shape. LET YOUR CAREGIVER KNOW ABOUT:  Allergies.  Medicines taken including herbs, eyedrops, over-the-counter medicines, and creams.  Use of steroids (by mouth or creams).  Previous problems with anesthetics or numbing medicine.  History of blood clots.  History of bleeding or blood problems.  Previous surgery.  Possibility of pregnancy, if this applies. RISKS AND COMPLICATIONS This is a simple and safe procedure. Problems are unlikely. However, problems can occur and may include:  Nausea or lightheadedness.  Low blood pressure.  Soreness, bleeding, swelling, or bruising at the needle insertion site.  Infection. BEFORE THE PROCEDURE  This is a procedure that can be done as an outpatient. Confirm the time that you need to arrive for your procedure. Confirm whether there is a need to fast or withhold any medications. It is helpful to wear clothing with sleeves that can be raised above the elbow. A blood sample may be done to determine the  amount of red blood cells or iron in your blood. Plan ahead of time to have someone drive you home after the procedure. PROCEDURE The entire procedure from preparation through recovery takes about 1 hour. The actual collection takes about 10 to 15 minutes.  A needle will be inserted into your vein.  Tubing and a collection bag will be attached to that needle.  Blood will flow through the needle and tubing into the collection bag.  You may be asked to open and close your hand slowly and continuously during the entire collection.  Once the specified amount of blood has been removed from your body, the collection bag and tubing will be clamped.  The needle will be removed.  Pressure will be held on the site of the needle insertion to stop the bleeding. Then a bandage will be placed over the needle insertion site. AFTER THE PROCEDURE  Your recovery will be assessed and monitored. If there are no problems, as an outpatient, you should be able to go home shortly after the procedure.  Document Released: 06/23/2010 Document Revised: 04/13/2011 Document Reviewed: 06/23/2010 [employer] Patient Information 2014 [employer].  

## 2013-02-22 NOTE — Progress Notes (Signed)
Patient here today for phlebotomy, c/o chronic pains and fatigue. Phlebotomy performed in L FA per orders without difficulty yielded 550 g/1 unit. Procedure lasting from [id number]. Patient tolerated well, holding conversation throughout. Drink provided, patient refused food, stating he ate breakfast prior to arrival. Patient monitored B01 minutes per policy and discharged in stable condition with vss.

## 2013-02-27 ENCOUNTER — Other Ambulatory Visit: Payer: Self-pay

## 2013-02-27 DIAGNOSIS — E349 Endocrine disorder, unspecified: Secondary | ICD-10-CM

## 2013-02-27 MED ORDER — NONFORMULARY OR COMPOUNDED ITEM: Status: DC

## 2013-03-15 ENCOUNTER — Encounter: Payer: Medicare Other | Admitting: Physical Medicine & Rehabilitation

## 2013-03-15 ENCOUNTER — Telehealth: Payer: Self-pay | Admitting: Physical Medicine & Rehabilitation

## 2013-03-15 DIAGNOSIS — M459 Ankylosing spondylitis of unspecified sites in spine: Secondary | ICD-10-CM

## 2013-03-15 MED ORDER — HYDROCODONE-ACETAMINOPHEN 7.5-325 MG PO TABS
1.0000 | ORAL_TABLET | Freq: Four times a day (QID) | ORAL | Status: DC | PRN
Start: 2013-03-15 — End: 2013-04-07

## 2013-03-15 MED ORDER — MORPHINE SULFATE ER 60 MG PO TBCR: 60.0000 mg | EXTENDED_RELEASE_TABLET | Freq: Three times a day (TID) | ORAL | Status: DC

## 2013-03-15 NOTE — Telephone Encounter (Signed)
Power out at office early. meds refilled. See me back in a month or RN for follow up

## 2013-03-29 ENCOUNTER — Encounter: Payer: Self-pay | Admitting: *Deleted

## 2013-03-29 ENCOUNTER — Other Ambulatory Visit (HOSPITAL_BASED_OUTPATIENT_CLINIC_OR_DEPARTMENT_OTHER): Payer: Medicare Other

## 2013-03-29 ENCOUNTER — Ambulatory Visit (HOSPITAL_BASED_OUTPATIENT_CLINIC_OR_DEPARTMENT_OTHER): Payer: Medicare Other

## 2013-03-29 DIAGNOSIS — Z862 Personal history of diseases of the blood and blood-forming organs and certain disorders involving the immune mechanism: Secondary | ICD-10-CM

## 2013-03-29 LAB — CBC WITH DIFFERENTIAL/PLATELET
BASO%: 0.7 % (ref 0.0–2.0)
Basophils Absolute: 0 10*3/uL (ref 0.0–0.1)
EOS%: 1.3 % (ref 0.0–7.0)
Eosinophils Absolute: 0.1 10*3/uL (ref 0.0–0.5)
HEMATOCRIT: 41.1 % (ref 38.4–49.9)
HGB: 14 g/dL (ref 13.0–17.1)
LYMPH#: 1.4 10*3/uL (ref 0.9–3.3)
LYMPH%: 31.6 % (ref 14.0–49.0)
MCH: 29.7 pg (ref 27.2–33.4)
MCHC: 34.1 g/dL (ref 32.0–36.0)
MCV: 87.1 fL (ref 79.3–98.0)
MONO#: 0.5 10*3/uL (ref 0.1–0.9)
MONO%: 11.3 % (ref 0.0–14.0)
NEUT%: 55.1 % (ref 39.0–75.0)
NEUTROS ABS: 2.5 10*3/uL (ref 1.5–6.5)
Platelets: 130 10*3/uL — ABNORMAL LOW (ref 140–400)
RBC: 4.72 10*6/uL (ref 4.20–5.82)
RDW: 13.2 % (ref 11.0–14.6)
WBC: 4.5 10*3/uL (ref 4.0–10.3)
nRBC: 0 % (ref 0–0)

## 2013-03-29 NOTE — Progress Notes (Signed)
Phlebotomy performed via left AC, 575 units removed.  Pt tolerated well. Observed for 30 minutes after procedure.  Pt requested drink, reported he at breakfast prior to arrival.

## 2013-03-29 NOTE — Patient Instructions (Signed)
Therapeutic Phlebotomy Therapeutic phlebotomy is the controlled removal of blood from your body for the purpose of treating a medical condition. It is similar to donating blood. Usually, about a pint (470 mL) of blood is removed. The average adult has 9 to 12 pints (4.3 to 5.7 L) of blood. Therapeutic phlebotomy may be used to treat the following medical conditions:  Hemochromatosis. This is a condition in which there is too much iron in the blood.  Polycythemia vera. This is a condition in which there are too many red cells in the blood.  Porphyria cutanea tarda. This is a disease usually passed from one generation to the next (inherited). It is a condition in which an important part of hemoglobin is not made properly. This results in the build up of abnormal amounts of porphyrins in the body.  Sickle cell disease. This is an inherited disease. It is a condition in which the red blood cells form an abnormal crescent shape rather than a round shape. LET YOUR CAREGIVER KNOW ABOUT:  Allergies.  Medicines taken including herbs, eyedrops, over-the-counter medicines, and creams.  Use of steroids (by mouth or creams).  Previous problems with anesthetics or numbing medicine.  History of blood clots.  History of bleeding or blood problems.  Previous surgery.  Possibility of pregnancy, if this applies. RISKS AND COMPLICATIONS This is a simple and safe procedure. Problems are unlikely. However, problems can occur and may include:  Nausea or lightheadedness.  Low blood pressure.  Soreness, bleeding, swelling, or bruising at the needle insertion site.  Infection. BEFORE THE PROCEDURE  This is a procedure that can be done as an outpatient. Confirm the time that you need to arrive for your procedure. Confirm whether there is a need to fast or withhold any medications. It is helpful to wear clothing with sleeves that can be raised above the elbow. A blood sample may be done to determine the  amount of red blood cells or iron in your blood. Plan ahead of time to have someone drive you home after the procedure. PROCEDURE The entire procedure from preparation through recovery takes about 1 hour. The actual collection takes about 10 to 15 minutes.  A needle will be inserted into your vein.  Tubing and a collection bag will be attached to that needle.  Blood will flow through the needle and tubing into the collection bag.  You may be asked to open and close your hand slowly and continuously during the entire collection.  Once the specified amount of blood has been removed from your body, the collection bag and tubing will be clamped.  The needle will be removed.  Pressure will be held on the site of the needle insertion to stop the bleeding. Then a bandage will be placed over the needle insertion site. AFTER THE PROCEDURE  Your recovery will be assessed and monitored. If there are no problems, as an outpatient, you should be able to go home shortly after the procedure.  Document Released: 06/23/2010 Document Revised: 04/13/2011 Document Reviewed: 06/23/2010 [employer] Patient Information 2014 [employer].  

## 2013-03-31 ENCOUNTER — Other Ambulatory Visit: Payer: Self-pay

## 2013-03-31 MED ORDER — MELOXICAM 7.5 MG PO TABS: ORAL_TABLET | ORAL | Status: DC

## 2013-04-07 ENCOUNTER — Other Ambulatory Visit: Payer: Self-pay | Admitting: *Deleted

## 2013-04-07 DIAGNOSIS — M459 Ankylosing spondylitis of unspecified sites in spine: Secondary | ICD-10-CM

## 2013-04-07 MED ORDER — MORPHINE SULFATE ER 60 MG PO TBCR: 60.0000 mg | EXTENDED_RELEASE_TABLET | Freq: Three times a day (TID) | ORAL | Status: DC

## 2013-04-07 MED ORDER — HYDROCODONE-ACETAMINOPHEN 7.5-325 MG PO TABS: 1.0000 | ORAL_TABLET | Freq: Four times a day (QID) | ORAL | Status: DC | PRN

## 2013-04-07 NOTE — Telephone Encounter (Signed)
RX printed for MD to sign for RN visit 04/10/13

## 2013-04-10 ENCOUNTER — Encounter: Payer: Self-pay | Admitting: *Deleted

## 2013-04-10 ENCOUNTER — Telehealth: Payer: Self-pay

## 2013-04-10 ENCOUNTER — Encounter: Payer: Medicare Other | Attending: Physical Medicine & Rehabilitation | Admitting: *Deleted

## 2013-04-10 VITALS — BP 132/55 | HR 82 | Resp 14

## 2013-04-10 DIAGNOSIS — I10 Essential (primary) hypertension: Secondary | ICD-10-CM | POA: Insufficient documentation

## 2013-04-10 DIAGNOSIS — M719 Bursopathy, unspecified: Secondary | ICD-10-CM | POA: Insufficient documentation

## 2013-04-10 DIAGNOSIS — Z79899 Other long term (current) drug therapy: Secondary | ICD-10-CM | POA: Insufficient documentation

## 2013-04-10 DIAGNOSIS — M67919 Unspecified disorder of synovium and tendon, unspecified shoulder: Secondary | ICD-10-CM | POA: Insufficient documentation

## 2013-04-10 DIAGNOSIS — F329 Major depressive disorder, single episode, unspecified: Secondary | ICD-10-CM | POA: Insufficient documentation

## 2013-04-10 DIAGNOSIS — E119 Type 2 diabetes mellitus without complications: Secondary | ICD-10-CM | POA: Insufficient documentation

## 2013-04-10 DIAGNOSIS — M459 Ankylosing spondylitis of unspecified sites in spine: Secondary | ICD-10-CM | POA: Insufficient documentation

## 2013-04-10 DIAGNOSIS — G8929 Other chronic pain: Secondary | ICD-10-CM | POA: Insufficient documentation

## 2013-04-10 DIAGNOSIS — Z5181 Encounter for therapeutic drug level monitoring: Secondary | ICD-10-CM

## 2013-04-10 DIAGNOSIS — F411 Generalized anxiety disorder: Secondary | ICD-10-CM | POA: Insufficient documentation

## 2013-04-10 DIAGNOSIS — M549 Dorsalgia, unspecified: Secondary | ICD-10-CM | POA: Insufficient documentation

## 2013-04-10 DIAGNOSIS — F3289 Other specified depressive episodes: Secondary | ICD-10-CM | POA: Insufficient documentation

## 2013-04-10 DIAGNOSIS — M76899 Other specified enthesopathies of unspecified lower limb, excluding foot: Secondary | ICD-10-CM | POA: Insufficient documentation

## 2013-04-10 NOTE — Telephone Encounter (Signed)
[facility] called requesting a refill authorization for patient;s Meloxicam. I contacted [employer] and informed them that the RX was escribed on 03/31/13 and they said they found the record and that it had been shipped to the patient.

## 2013-04-10 NOTE — Progress Notes (Signed)
Here for pill count and medication refills.MS Contin 60 mg # 90 Fill date 03/15/13 Today NV#12 , Hydrocodone 7.5/325 # 120 Fill date  03/15/13  Today NV# 16  VSS    He has had no falls.  He uses a cane.  His fall risk is moderate.  Educated and handout given on fall prevention in the home today.  He is upset because his son is over at [facility] with an esophageal tear which has caused him to bleed out and "nearly die".  He needs to have a cardiac cath and they were going to transfer him to [facility] but his hgb has not come up so they have had to give him additional blood.  The cath will be postponed to tomorrow.  We collected a random UDS today.  He did not have an opioid risk score so one was done and it is 0.  Return in one month to see Dr Naaman Plummer on 05/10/13.

## 2013-04-10 NOTE — Patient Instructions (Signed)
Follow up one month with DR Naaman Plummer  05/10/13

## 2013-04-26 ENCOUNTER — Other Ambulatory Visit (HOSPITAL_BASED_OUTPATIENT_CLINIC_OR_DEPARTMENT_OTHER): Payer: Medicare Other

## 2013-04-26 ENCOUNTER — Ambulatory Visit (HOSPITAL_BASED_OUTPATIENT_CLINIC_OR_DEPARTMENT_OTHER): Payer: Medicare Other

## 2013-04-26 VITALS — BP 111/75 | HR 83 | Temp 97.5°F | Resp 19 | Ht 75.0 in

## 2013-04-26 DIAGNOSIS — Z862 Personal history of diseases of the blood and blood-forming organs and certain disorders involving the immune mechanism: Secondary | ICD-10-CM

## 2013-04-26 LAB — CBC WITH DIFFERENTIAL/PLATELET
BASO%: 0.8 % (ref 0.0–2.0)
BASOS ABS: 0 10*3/uL (ref 0.0–0.1)
EOS%: 1.9 % (ref 0.0–7.0)
Eosinophils Absolute: 0.1 10*3/uL (ref 0.0–0.5)
HCT: 39.9 % (ref 38.4–49.9)
HGB: 13.5 g/dL (ref 13.0–17.1)
LYMPH%: 30.5 % (ref 14.0–49.0)
MCH: 29.5 pg (ref 27.2–33.4)
MCHC: 33.9 g/dL (ref 32.0–36.0)
MCV: 87.2 fL (ref 79.3–98.0)
MONO#: 0.4 10*3/uL (ref 0.1–0.9)
MONO%: 10.7 % (ref 0.0–14.0)
NEUT%: 56.1 % (ref 39.0–75.0)
NEUTROS ABS: 2.2 10*3/uL (ref 1.5–6.5)
PLATELETS: 102 10*3/uL — AB (ref 140–400)
RBC: 4.57 10*6/uL (ref 4.20–5.82)
RDW: 13.9 % (ref 11.0–14.6)
WBC: 3.9 10*3/uL — AB (ref 4.0–10.3)
lymph#: 1.2 10*3/uL (ref 0.9–3.3)

## 2013-04-26 LAB — FERRITIN CHCC: Ferritin: 47 ng/ml (ref 22–316)

## 2013-04-26 NOTE — Patient Instructions (Signed)

## 2013-04-26 NOTE — Progress Notes (Signed)
Phlebotomy performed using 18G phlebotomy set to right forearm, 550 units removed. Patient reports eating breakfast prior to procedure. Tolerated phlebotomy well, drink and snack provided after procedure. 30 min observation completed at 0910

## 2013-05-10 ENCOUNTER — Encounter: Payer: Medicare Other | Attending: Physical Medicine & Rehabilitation | Admitting: Physical Medicine & Rehabilitation

## 2013-05-10 ENCOUNTER — Encounter: Payer: Self-pay | Admitting: Physical Medicine & Rehabilitation

## 2013-05-10 VITALS — BP 126/78 | HR 88 | Resp 14 | Ht 75.0 in | Wt 264.0 lb

## 2013-05-10 DIAGNOSIS — M7060 Trochanteric bursitis, unspecified hip: Secondary | ICD-10-CM

## 2013-05-10 DIAGNOSIS — M76899 Other specified enthesopathies of unspecified lower limb, excluding foot: Secondary | ICD-10-CM

## 2013-05-10 DIAGNOSIS — M67919 Unspecified disorder of synovium and tendon, unspecified shoulder: Secondary | ICD-10-CM

## 2013-05-10 DIAGNOSIS — M719 Bursopathy, unspecified: Secondary | ICD-10-CM

## 2013-05-10 DIAGNOSIS — M459 Ankylosing spondylitis of unspecified sites in spine: Secondary | ICD-10-CM | POA: Insufficient documentation

## 2013-05-10 MED ORDER — MORPHINE SULFATE ER 60 MG PO TBCR: 60.0000 mg | EXTENDED_RELEASE_TABLET | Freq: Three times a day (TID) | ORAL | Status: DC

## 2013-05-10 MED ORDER — HYDROCODONE-ACETAMINOPHEN 7.5-325 MG PO TABS: 1.0000 | ORAL_TABLET | Freq: Four times a day (QID) | ORAL | Status: DC | PRN

## 2013-05-10 NOTE — Patient Instructions (Signed)
DECREASE YOUR TESOSTERONE CREAM TO 5MG  DAILY UNTIL SYRINGE IS EMPTY

## 2013-05-10 NOTE — Progress Notes (Signed)
Subjective:    Patient ID: Daniel Rivers, male    DOB: 06/24/1950, 63 y.o.   MRN: [id number]  HPI  Daniel Rivers is back regarding his chronic pain. He struggled with some depression over the holidays centering around his wife's passing. He feels that he's getting back to his old self over the last couple.  His continues on his MS contin and norco which seem to control his pain.    Pain Inventory Average Pain 6 Pain Right Now 6 My pain is sharp, burning, dull, stabbing, tingling and aching  In the last 24 hours, has pain interfered with the following? General activity 4 Relation with others 3 Enjoyment of life 6 What TIME of day is your pain at its worst? daytime Sleep (in general) Fair  Pain is worse with: walking, bending, sitting, standing and some activites Pain improves with: rest, heat/ice, pacing activities and medication Relief from Meds: 5  Mobility walk with assistance use a cane how many minutes can you walk? 10-15 Do you have any goals in this area?  no  Function Do you have any goals in this area?  no  Neuro/Psych weakness numbness depression anxiety  Prior Studies Any changes since last visit?  no  Physicians involved in your care Any changes since last visit?  no   History reviewed. No pertinent family history. History   Social History  . Marital Status: Married    Spouse Name: N/A    Number of Children: N/A  . Years of Education: N/A   Social History Main Topics  . Smoking status: Never Smoker   . Smokeless tobacco: None  . Alcohol Use: None  . Drug Use: None  . Sexual Activity: None   Other Topics Concern  . None   Social History Narrative  . None   Past Surgical History  Procedure Laterality Date  . Cortisone injection    . Small intestine surgery    . Spine surgery    . Neuroplasty / transposition median nerve at carpal tunnel bilateral    . Elbow surgery    . Rectal fissure repair    . Rotator cuff repair    . Stint  placement     Past Medical History  Diagnosis Date  . Ankylosing spondylitis of lumbar region   . Ankylosing spondylitis of cervical region   . Obesity   . Bursitis, trochanteric   . Rotator cuff syndrome   . Tendonitis   . Depression   . Anxiety   . Back pain   . Hypertension   . Diabetes mellitus    BP 126/78  Pulse 88  Resp 14  Ht 6\' 3"  (1.905 m)  Wt 264 lb (119.75 kg)  BMI 33.00 kg/m2  SpO2 98%  Opioid Risk Score:   Fall Risk Score: Moderate Fall Risk (6-13 points) (pt educated and given a brochure on fall risk previously)    Review of Systems  Musculoskeletal: Positive for back pain.  Neurological: Positive for weakness and numbness.  Psychiatric/Behavioral:       Depression  All other systems reviewed and are negative.      Objective:   Physical Exam  Constitutional: He is oriented to person, place, and time. He appears well-developed and well-nourished.  HENT:  Head: Normocephalic and atraumatic.  Nose: Nose normal.  Mouth/Throat: Oropharynx is clear and moist.  Eyes: Conjunctivae are normal. Pupils are equal, round, and reactive to light.  Cardiovascular: Normal rate and regular rhythm.  Pulmonary/Chest: Effort  normal. No respiratory distress. He has no wheezes.  Abdominal: He exhibits no distension. There is no tenderness.  Musculoskeletal:  Left shoulder: He exhibits decreased range of motion, tenderness and bony tenderness.  Lumbar back: He exhibits decreased range of motion, tenderness, bony tenderness, swelling and edema.  Both greater trochs are tender, especially with cross legged maneuver and with manual palpation. Neurological: He is alert and oriented to person, place, and time.  he has more difficulty with flexion exercises in the lumbar spine as well as the back today. He transfers easily from a seated to standing position and balance is good. He uses a cane for balance as well.  Assessment & Plan:   ASSESSMENT:  1. Ankylosing spondylitis  of the cervical and lumbar spine.  2. Left and right greater trochanter bursitis.  3. Bilateral rotator cuff syndrome.  4. Left bicipital tendonitis.  5. Hypo-testosterone state:    PLAN:  1. Continue with HEP. 2. Current medication regimen is still effective. Refilled morphine  60 mg CR q.8 hours #270 and Norco 7.5, #270 with 3 month  prescriptions were written today.  3. May maintain mobic 7.5mg  temporarily for his hips. Can change to prn. Don't want to use this long term on a scheduled basis given his aspirin use.  4. Reduce testosterone to 5mg  daily for a month then stop given ?BPH symptoms. Advised him to follow up with PCP if problems continue .  5. He will see me back in 3 months. All questions were encouraged and answered

## 2013-05-19 ENCOUNTER — Ambulatory Visit (HOSPITAL_BASED_OUTPATIENT_CLINIC_OR_DEPARTMENT_OTHER): Payer: Medicare Other | Admitting: Oncology

## 2013-05-19 ENCOUNTER — Encounter: Payer: Self-pay | Admitting: Oncology

## 2013-05-19 ENCOUNTER — Other Ambulatory Visit (HOSPITAL_BASED_OUTPATIENT_CLINIC_OR_DEPARTMENT_OTHER): Payer: Medicare Other

## 2013-05-19 ENCOUNTER — Telehealth: Payer: Self-pay | Admitting: Oncology

## 2013-05-19 ENCOUNTER — Ambulatory Visit (HOSPITAL_BASED_OUTPATIENT_CLINIC_OR_DEPARTMENT_OTHER): Payer: Medicare Other

## 2013-05-19 VITALS — BP 129/73 | HR 80

## 2013-05-19 DIAGNOSIS — K7460 Unspecified cirrhosis of liver: Secondary | ICD-10-CM

## 2013-05-19 DIAGNOSIS — D696 Thrombocytopenia, unspecified: Secondary | ICD-10-CM

## 2013-05-19 DIAGNOSIS — Z862 Personal history of diseases of the blood and blood-forming organs and certain disorders involving the immune mechanism: Secondary | ICD-10-CM

## 2013-05-19 LAB — CBC WITH DIFFERENTIAL/PLATELET
BASO%: 0.8 % (ref 0.0–2.0)
Basophils Absolute: 0 10*3/uL (ref 0.0–0.1)
EOS%: 1.3 % (ref 0.0–7.0)
Eosinophils Absolute: 0.1 10*3/uL (ref 0.0–0.5)
HCT: 39 % (ref 38.4–49.9)
HGB: 13.4 g/dL (ref 13.0–17.1)
LYMPH#: 1.1 10*3/uL (ref 0.9–3.3)
LYMPH%: 28.6 % (ref 14.0–49.0)
MCH: 29.6 pg (ref 27.2–33.4)
MCHC: 34.4 g/dL (ref 32.0–36.0)
MCV: 86.3 fL (ref 79.3–98.0)
MONO#: 0.4 10*3/uL (ref 0.1–0.9)
MONO%: 10.1 % (ref 0.0–14.0)
NEUT#: 2.2 10*3/uL (ref 1.5–6.5)
NEUT%: 59.2 % (ref 39.0–75.0)
NRBC: 0 % (ref 0–0)
Platelets: 106 10*3/uL — ABNORMAL LOW (ref 140–400)
RBC: 4.52 10*6/uL (ref 4.20–5.82)
RDW: 13.5 % (ref 11.0–14.6)
WBC: 3.8 10*3/uL — ABNORMAL LOW (ref 4.0–10.3)

## 2013-05-19 NOTE — Progress Notes (Signed)
Hematology and Oncology Follow Up Visit  Daniel Rivers [id number] 27-Nov-1950 63 y.o. 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 He established care at the [facility] in August of 2014.   Prior Therapy: He have been receiving intermittent phlebotomies in the past.  Current therapy: He have been receiving a monthly phlebotomies to achieve ferritin level of less than 50.  Interim History:  Daniel Rivers presents today for a followup visit. He is a pleasant man with hemochromatosis. He had been receiving monthly phlebotomy without any complications. He is not reported any fatigue or tiredness or difficulty breathing. He had not reported any recent illnesses or hospitalizations. His energy and performance status is about the same. He has not reported any chest pain or shortness of breath or difficulty breathing. Had not reported any GI or GU symptoms. He has reported slight increase in his blood sugars but have not had any other complications. He has not reported any headaches or blurry vision or alteration of his mental status. Has not had any genitourinary complaints of frequency urgency or hesitancy. Has not reported any lymphadenopathy or petechiae. Has not reported any easy bruisability or any bleeding.  Medications: I have reviewed the patient's current medications.  Current Outpatient Prescriptions  Medication Sig Dispense Refill  . ACCU-CHEK FASTCLIX LANCETS MISC       . aspirin 81 MG tablet Take 160 mg by mouth daily.      . bisacodyl (DULCOLAX) 5 MG EC tablet Take 5 mg by mouth at bedtime. 2 at bedtime      . carvedilol (COREG) 3.125 MG tablet Take 3.125 mg by mouth 2 (two) times daily.      . Cholecalciferol (VITAMIN D3) 2000 UNITS TABS Take 1 tablet by mouth daily.      . furosemide (LASIX) 20 MG tablet       . glimepiride (AMARYL) 2 MG tablet Take 2 mg by  mouth daily before breakfast. Takes 1/2 every morning      . HYDROcodone-acetaminophen (NORCO) 7.5-325 MG per tablet Take 1 tablet by mouth every 6 (six) hours as needed. 1 month supply  120 tablet  0  . KRILL OIL PO Take by mouth.      . losartan (COZAAR) 100 MG tablet Take 100 mg by mouth daily.      . Magnesium Oxide 400 MG CAPS Take 1 capsule by mouth daily.      . meloxicam (MOBIC) 7.5 MG tablet Take 1 tablet by mouth  daily  90 tablet  3  . metFORMIN (GLUCOPHAGE) 1000 MG tablet Take 1,000 mg by mouth every 12 (twelve) hours.      [name] morphine (MS CONTIN) 60 MG 12 hr tablet Take 1 tablet (60 mg total) by mouth 3 (three) times daily.  90 tablet  0  . Multiple Vitamin (MULTIVITAMIN) capsule Take 1 capsule by mouth daily. NO IRON      . NITROSTAT 0.4 MG SL tablet       . NONFORMULARY OR COMPOUNDED ITEM TESTOSTERONE GEL 10%. APPLY 1ML DAILY TO SKIN.  30 each  6  . polyethylene glycol powder (GLYCOLAX/MIRALAX) powder Using measuring cap mix  17gm in water and drink two times daily  3162 g  11  . pravastatin (PRAVACHOL) 40 MG tablet       . Psyllium (METAMUCIL PO) Take 17 g/day by mouth 2 (two) times daily.      [name]  zolpidem (AMBIEN) 10 MG tablet Take 10 mg by mouth at bedtime as needed.       No current facility-administered medications for this visit.     Allergies:  Allergies  Allergen Reactions  . Amlodipine Besy-Benazepril Hcl   . Naproxen     Past Medical History, Surgical history, Social history, and Family History were reviewed and updated.  Review of Systems:  Remaining ROS negative. Physical Exam: Blood pressure 132/80, pulse 91, temperature 98.3 F (36.8 C), temperature source Oral, resp. rate 19, height 6\' 3"  (1.905 m), weight 268 lb 11.2 oz (121.882 kg). ECOG: 1 General appearance: alert, cooperative and appears stated age Head: Normocephalic, without obvious abnormality, atraumatic Neck: no adenopathy, no JVD, supple, symmetrical, trachea midline and thyroid not  enlarged, symmetric, no tenderness/mass/nodules Lymph nodes: Cervical, supraclavicular, and axillary nodes normal. Heart:regular rate and rhythm, S1, S2 normal, no murmur, click, rub or gallop Lung:chest clear, no wheezing, rales, normal symmetric air entry, Heart exam - S1, S2 normal, no murmur, no gallop, rate regular Abdomin: soft, non-tender, without masses or organomegaly EXT:no erythema, induration, or nodules   Lab Results: Lab Results  Component Value Date   WBC 3.8* 05/19/2013   HGB 13.4 05/19/2013   HCT 39.0 05/19/2013   MCV 86.3 05/19/2013   PLT 106* 05/19/2013     Chemistry      Component Value Date/Time   NA 140 09/21/2012 1052   NA 138 04/20/2007 0825   K 4.0 09/21/2012 1052   K 3.9 04/20/2007 0825   CL 101 03/18/2007 0203   CO2 26 09/21/2012 1052   CO2 29 03/18/2007 0203   BUN 14.0 09/21/2012 1052   BUN 10 03/18/2007 0203   CREATININE 0.9 09/21/2012 1052   CREATININE 0.77 03/18/2007 0203      Component Value Date/Time   CALCIUM 9.4 09/21/2012 1052   CALCIUM 8.9 03/18/2007 0203   ALKPHOS 53 09/21/2012 1052   AST 48* 09/21/2012 1052   ALT 52 09/21/2012 1052   BILITOT 2.10* 09/21/2012 1052      Results for KONNOR, VONDRASEK (MRN [id number]) as of 05/19/2013 08:04  Ref. Range 09/21/2012 10:52 12/21/2012 07:55 04/26/2013 08:12  Ferritin Latest Range: 22.0-322.0 ng/mL 281 73 47      Impression and Plan:  63 year old gentleman with the following issues:  1. Hemachromatosis he is currently on phlebotomy to keep his ferritin less than 50. He has tolerated these phlebotomies on a monthly basis without any complications. His last ferritin was 47. The plan is increased the interval between phlebotomies at this time to every 3 months rather than monthly. I will reevaluate him in about 3 months and recheck his ferritin, if it's elevated we will proceed with a phlebotomy.  2. Cirrhosis of the liver: He follows up with gastroenterology regarding that.  3. Thrombocytopenia: This is  likely related to his cirrhosis of the liver no complications from that noted.   Wyatt Portela, MD 4/17/20158:31 AM

## 2013-05-19 NOTE — Patient Instructions (Signed)
Therapeutic Phlebotomy Therapeutic phlebotomy is the controlled removal of blood from your body for the purpose of treating a medical condition. It is similar to donating blood. Usually, about a pint (470 mL) of blood is removed. The average adult has 9 to 12 pints (4.3 to 5.7 L) of blood. Therapeutic phlebotomy may be used to treat the following medical conditions:  Hemochromatosis. This is a condition in which there is too much iron in the blood.  Polycythemia vera. This is a condition in which there are too many red cells in the blood.  Porphyria cutanea tarda. This is a disease usually passed from one generation to the next (inherited). It is a condition in which an important part of hemoglobin is not made properly. This results in the build up of abnormal amounts of porphyrins in the body.  Sickle cell disease. This is an inherited disease. It is a condition in which the red blood cells form an abnormal crescent shape rather than a round shape. LET YOUR CAREGIVER KNOW ABOUT:  Allergies.  Medicines taken including herbs, eyedrops, over-the-counter medicines, and creams.  Use of steroids (by mouth or creams).  Previous problems with anesthetics or numbing medicine.  History of blood clots.  History of bleeding or blood problems.  Previous surgery.  Possibility of pregnancy, if this applies. RISKS AND COMPLICATIONS This is a simple and safe procedure. Problems are unlikely. However, problems can occur and may include:  Nausea or lightheadedness.  Low blood pressure.  Soreness, bleeding, swelling, or bruising at the needle insertion site.  Infection. BEFORE THE PROCEDURE  This is a procedure that can be done as an outpatient. Confirm the time that you need to arrive for your procedure. Confirm whether there is a need to fast or withhold any medications. It is helpful to wear clothing with sleeves that can be raised above the elbow. A blood sample may be done to determine the  amount of red blood cells or iron in your blood. Plan ahead of time to have someone drive you home after the procedure. PROCEDURE The entire procedure from preparation through recovery takes about 1 hour. The actual collection takes about 10 to 15 minutes.  A needle will be inserted into your vein.  Tubing and a collection bag will be attached to that needle.  Blood will flow through the needle and tubing into the collection bag.  You may be asked to open and close your hand slowly and continuously during the entire collection.  Once the specified amount of blood has been removed from your body, the collection bag and tubing will be clamped.  The needle will be removed.  Pressure will be held on the site of the needle insertion to stop the bleeding. Then a bandage will be placed over the needle insertion site. AFTER THE PROCEDURE  Your recovery will be assessed and monitored. If there are no problems, as an outpatient, you should be able to go home shortly after the procedure.  Document Released: 06/23/2010 Document Revised: 04/13/2011 Document Reviewed: 06/23/2010 [employer] Patient Information 2014 [employer].  

## 2013-05-19 NOTE — Progress Notes (Signed)
Phlebotomy completed today per MD order. 1 unit blood removed from right antecubital without any complications. Patient observed 30 minutes afterward and VS taken. Patient discharged home. Cindi Carbon, RN

## 2013-05-19 NOTE — Telephone Encounter (Signed)
gv and printed appt sched and avs for pt for July....sed added tx. °

## 2013-06-19 ENCOUNTER — Other Ambulatory Visit: Payer: Self-pay

## 2013-06-19 MED ORDER — POLYETHYLENE GLYCOL 3350 17 GM/SCOOP PO POWD: ORAL | Status: DC

## 2013-06-19 NOTE — Telephone Encounter (Signed)
Request received from pharmacy to refill Polyethylene Glycol. Refill sent to pharmacy.

## 2013-07-26 ENCOUNTER — Telehealth: Payer: Self-pay

## 2013-07-26 DIAGNOSIS — M459 Ankylosing spondylitis of unspecified sites in spine: Secondary | ICD-10-CM

## 2013-07-26 MED ORDER — MORPHINE SULFATE ER 60 MG PO TBCR: 60.0000 mg | EXTENDED_RELEASE_TABLET | Freq: Three times a day (TID) | ORAL | Status: DC

## 2013-07-26 MED ORDER — HYDROCODONE-ACETAMINOPHEN 7.5-325 MG PO TABS: 1.0000 | ORAL_TABLET | Freq: Four times a day (QID) | ORAL | Status: DC | PRN

## 2013-07-26 NOTE — Telephone Encounter (Signed)
Notifed  Mr Mcgillis that rx available for pick up.

## 2013-07-26 NOTE — Telephone Encounter (Signed)
Patient called requesting hydrocodone and morphine refills.  Rx printed to be signed.  Will contact patient when ready for pick up.

## 2013-08-07 ENCOUNTER — Ambulatory Visit: Payer: Medicare Other | Admitting: Physical Medicine & Rehabilitation

## 2013-08-09 ENCOUNTER — Encounter: Payer: Medicare Other | Attending: Physical Medicine & Rehabilitation | Admitting: Registered Nurse

## 2013-08-09 ENCOUNTER — Other Ambulatory Visit: Payer: Self-pay

## 2013-08-09 ENCOUNTER — Encounter: Payer: Self-pay | Admitting: Registered Nurse

## 2013-08-09 VITALS — BP 117/76 | HR 86 | Resp 14 | Ht 75.0 in | Wt 263.0 lb

## 2013-08-09 DIAGNOSIS — Z5181 Encounter for therapeutic drug level monitoring: Secondary | ICD-10-CM

## 2013-08-09 DIAGNOSIS — M67912 Unspecified disorder of synovium and tendon, left shoulder: Secondary | ICD-10-CM

## 2013-08-09 DIAGNOSIS — Z79899 Other long term (current) drug therapy: Secondary | ICD-10-CM

## 2013-08-09 DIAGNOSIS — M7061 Trochanteric bursitis, right hip: Secondary | ICD-10-CM

## 2013-08-09 DIAGNOSIS — M719 Bursopathy, unspecified: Secondary | ICD-10-CM

## 2013-08-09 DIAGNOSIS — M459 Ankylosing spondylitis of unspecified sites in spine: Secondary | ICD-10-CM | POA: Diagnosis present

## 2013-08-09 DIAGNOSIS — M7062 Trochanteric bursitis, left hip: Secondary | ICD-10-CM

## 2013-08-09 DIAGNOSIS — M76899 Other specified enthesopathies of unspecified lower limb, excluding foot: Secondary | ICD-10-CM

## 2013-08-09 DIAGNOSIS — M67919 Unspecified disorder of synovium and tendon, unspecified shoulder: Secondary | ICD-10-CM

## 2013-08-09 MED ORDER — HYDROCODONE-ACETAMINOPHEN 7.5-325 MG PO TABS: 1.0000 | ORAL_TABLET | Freq: Four times a day (QID) | ORAL | Status: DC | PRN

## 2013-08-09 MED ORDER — MORPHINE SULFATE ER 60 MG PO TBCR: 60.0000 mg | EXTENDED_RELEASE_TABLET | Freq: Three times a day (TID) | ORAL | Status: DC

## 2013-08-09 NOTE — Progress Notes (Signed)
Subjective:    Patient ID: Daniel Rivers, male    DOB: 17-Aug-1950, 63 y.o.   MRN: [id number]  HPI: [name] is a 63 year male who returns for chronic pain and medication refill. He says his pain is located in his neck, bilateral hips and left leg. He rates his pain 6. His current exercise regime is performing stretching exercises.    Pain Inventory Average Pain 6 Pain Right Now 6 My pain is sharp, burning, dull, stabbing, tingling and aching  In the last 24 hours, has pain interfered with the following? General activity 5 Relation with others 3 Enjoyment of life 6 What TIME of day is your pain at its worst? day, evening Sleep (in general) Fair  Pain is worse with: walking, bending, sitting, standing and some activites Pain improves with: rest, heat/ice, therapy/exercise, pacing activities and medication Relief from Meds: 4  Mobility walk with assistance use a cane transfers alone Do you have any goals in this area?  no  Function Do you have any goals in this area?  no  Neuro/Psych weakness numbness tingling trouble walking loss of taste or smell  Prior Studies Any changes since last visit?  no  Physicians involved in your care Any changes since last visit?  no   History reviewed. No pertinent family history. History   Social History  . Marital Status: Married    Spouse Name: N/A    Number of Children: N/A  . Years of Education: N/A   Social History Main Topics  . Smoking status: Never Smoker   . Smokeless tobacco: None  . Alcohol Use: None  . Drug Use: None  . Sexual Activity: None   Other Topics Concern  . None   Social History Narrative  . None   Past Surgical History  Procedure Laterality Date  . Cortisone injection    . Small intestine surgery    . Spine surgery    . Neuroplasty / transposition median nerve at carpal tunnel bilateral    . Elbow surgery    . Rectal fissure repair    . Rotator cuff repair    . Stint  placement     Past Medical History  Diagnosis Date  . Ankylosing spondylitis of lumbar region   . Ankylosing spondylitis of cervical region   . Obesity   . Bursitis, trochanteric   . Rotator cuff syndrome   . Tendonitis   . Depression   . Anxiety   . Back pain   . Hypertension   . Diabetes mellitus    BP 117/76  Pulse 86  Resp 14  Ht 6\' 3"  (1.905 m)  Wt 263 lb (119.296 kg)  BMI 32.87 kg/m2  SpO2 96%  Opioid Risk Score:   Fall Risk Score: Moderate Fall Risk (6-13 points) (pt educated on fall risk, brochure given to pt previously)    Review of Systems  Constitutional: Positive for appetite change.  HENT:       Loss of taste or smell  Musculoskeletal: Positive for back pain and gait problem.  Neurological: Positive for weakness and numbness.       Tingling  All other systems reviewed and are negative.      Objective:   Physical Exam  Nursing note and vitals reviewed. Constitutional: He is oriented to person, place, and time. He appears well-developed and well-nourished.  HENT:  Head: Normocephalic and atraumatic.  Neck: Normal range of motion. Neck supple.  Cardiovascular: Normal rate and  regular rhythm.   Pulmonary/Chest: Effort normal and breath sounds normal.  Musculoskeletal:  Normal Muscle Bulk and Muscle Testing Reveals: Upper Extremities: Full ROM and Muscle Strength 5/5 Spinal Forward Flexion: 45 Degree and Extension 10 Degrees Lumbar Paraspinal Tenderness: L-3- L-5 Lower Extremities: Full ROM and Muscle Strength 5/5 Left Leg Flexion Produces Pain into Patella and Ankle Arises from chair with ease/ Uses straight cane for support Narrow Based Gait  Neurological: He is alert and oriented to person, place, and time.  Skin: Skin is warm and dry.  Psychiatric: He has a normal mood and affect.          Assessment & Plan:  1. Ankylosing spondylitis of the cervical and lumbar spine: Refilled: Hydrocodone 7.5/325mg  one tablet every 6 hours as needed  #120 and MS Contin 60 mg one tablet three times a day #90. Second script given. 2. Left and Right greater trochanter bursitis: Continue  With Mobic and  exercise and current medication regime.  30 minutes of face to face patient care time was spent during this visit. All questions were encouraged and answered.  F/U in 3 months

## 2013-08-11 ENCOUNTER — Telehealth: Payer: Self-pay | Admitting: Oncology

## 2013-08-11 NOTE — Telephone Encounter (Signed)
pt called to move lab to same day as visit...done...pt ok and aware

## 2013-08-15 ENCOUNTER — Other Ambulatory Visit: Payer: Medicare Other

## 2013-08-16 ENCOUNTER — Telehealth: Payer: Self-pay | Admitting: Oncology

## 2013-08-16 ENCOUNTER — Encounter: Payer: Self-pay | Admitting: Oncology

## 2013-08-16 ENCOUNTER — Telehealth: Payer: Self-pay | Admitting: *Deleted

## 2013-08-16 ENCOUNTER — Other Ambulatory Visit (HOSPITAL_BASED_OUTPATIENT_CLINIC_OR_DEPARTMENT_OTHER): Payer: Medicare Other

## 2013-08-16 ENCOUNTER — Telehealth: Payer: Self-pay | Admitting: Medical Oncology

## 2013-08-16 ENCOUNTER — Ambulatory Visit (HOSPITAL_BASED_OUTPATIENT_CLINIC_OR_DEPARTMENT_OTHER): Payer: Medicare Other | Admitting: Oncology

## 2013-08-16 VITALS — BP 125/69 | HR 80 | Temp 97.4°F | Resp 18 | Ht 75.0 in | Wt 259.8 lb

## 2013-08-16 DIAGNOSIS — K7460 Unspecified cirrhosis of liver: Secondary | ICD-10-CM

## 2013-08-16 DIAGNOSIS — Z862 Personal history of diseases of the blood and blood-forming organs and certain disorders involving the immune mechanism: Secondary | ICD-10-CM

## 2013-08-16 DIAGNOSIS — D696 Thrombocytopenia, unspecified: Secondary | ICD-10-CM

## 2013-08-16 LAB — COMPREHENSIVE METABOLIC PANEL (CC13)
ALBUMIN: 3.9 g/dL (ref 3.5–5.0)
ALT: 35 U/L (ref 0–55)
AST: 46 U/L — ABNORMAL HIGH (ref 5–34)
Alkaline Phosphatase: 55 U/L (ref 40–150)
Anion Gap: 10 mEq/L (ref 3–11)
BUN: 11.7 mg/dL (ref 7.0–26.0)
CALCIUM: 9.7 mg/dL (ref 8.4–10.4)
CHLORIDE: 102 meq/L (ref 98–109)
CO2: 27 mEq/L (ref 22–29)
Creatinine: 1 mg/dL (ref 0.7–1.3)
Glucose: 156 mg/dl — ABNORMAL HIGH (ref 70–140)
POTASSIUM: 4.2 meq/L (ref 3.5–5.1)
Sodium: 140 mEq/L (ref 136–145)
Total Bilirubin: 1.34 mg/dL — ABNORMAL HIGH (ref 0.20–1.20)
Total Protein: 7.4 g/dL (ref 6.4–8.3)

## 2013-08-16 LAB — CBC WITH DIFFERENTIAL/PLATELET
BASO%: 0.8 % (ref 0.0–2.0)
BASOS ABS: 0 10*3/uL (ref 0.0–0.1)
EOS%: 1.6 % (ref 0.0–7.0)
Eosinophils Absolute: 0.1 10*3/uL (ref 0.0–0.5)
HEMATOCRIT: 42.3 % (ref 38.4–49.9)
HGB: 13.7 g/dL (ref 13.0–17.1)
LYMPH%: 29 % (ref 14.0–49.0)
MCH: 28.1 pg (ref 27.2–33.4)
MCHC: 32.4 g/dL (ref 32.0–36.0)
MCV: 86.9 fL (ref 79.3–98.0)
MONO#: 0.4 10*3/uL (ref 0.1–0.9)
MONO%: 9.4 % (ref 0.0–14.0)
NEUT%: 59.2 % (ref 39.0–75.0)
NEUTROS ABS: 2.4 10*3/uL (ref 1.5–6.5)
Platelets: 123 10*3/uL — ABNORMAL LOW (ref 140–400)
RBC: 4.87 10*6/uL (ref 4.20–5.82)
RDW: 15 % — AB (ref 11.0–14.6)
WBC: 4.1 10*3/uL (ref 4.0–10.3)
lymph#: 1.2 10*3/uL (ref 0.9–3.3)

## 2013-08-16 LAB — FERRITIN CHCC: Ferritin: 63 ng/ml (ref 22–316)

## 2013-08-16 NOTE — Progress Notes (Signed)
Hematology and Oncology Follow Up Visit  Daniel Rivers [id number] 1950-09-07 63 y.o. 08/16/2013 10:00 AM Daniel Rivers, MDPang, Richard, MD   Principle Diagnosis: 63 year old with hemochromatosis and liver cirrhosis diagnosed in 2009. He established care at the [facility] in August of 2014.   Prior Therapy: He have been receiving intermittent phlebotomies in the past.  Current therapy: He have been receiving a monthly phlebotomies to achieve ferritin level of less than 50.  Interim History:  [name] presents today for a followup visit by him self. Since his last visit, he has been well without any new complaints. He is not reported any fatigue or tiredness or difficulty breathing. He had not reported any recent illnesses or hospitalizations. His energy and performance status is about the same. He has not reported any chest pain or shortness of breath or difficulty breathing. Had not reported any GI or GU symptoms. He has not reported any headaches or blurry vision or alteration of his mental status. Has not had any genitourinary complaints of frequency urgency or hesitancy. Has not reported any lymphadenopathy or petechiae. Has not reported any easy bruisability or any bleeding. The rest of his review of symptoms is unremarkable.   Medications: I have reviewed the patient's current medications.  Current Outpatient Prescriptions  Medication Sig Dispense Refill  . ACCU-CHEK FASTCLIX LANCETS MISC       . aspirin 81 MG tablet Take 160 mg by mouth daily.      . bisacodyl (DULCOLAX) 5 MG EC tablet Take 5 mg by mouth at bedtime. 2 at bedtime      . buPROPion (WELLBUTRIN XL) 300 MG 24 hr tablet       . carvedilol (COREG) 3.125 MG tablet Take 3.125 mg by mouth 2 (two) times daily.      . Cholecalciferol (VITAMIN D3) 2000 UNITS TABS Take 1 tablet by mouth daily.      . furosemide (LASIX) 20 MG tablet       . glimepiride (AMARYL) 2 MG tablet Take 2 mg by mouth daily before breakfast.  Takes 1/2 every morning      . HYDROcodone-acetaminophen (NORCO) 7.5-325 MG per tablet Take 1 tablet by mouth every 6 (six) hours as needed. 1 month supply  120 tablet  0  . KRILL OIL PO Take by mouth.      . losartan (COZAAR) 100 MG tablet Take 100 mg by mouth daily.      . Magnesium Oxide 400 MG CAPS Take 1 capsule by mouth daily.      . meloxicam (MOBIC) 7.5 MG tablet Take 1 tablet by mouth  daily  90 tablet  3  . metFORMIN (GLUCOPHAGE) 1000 MG tablet Take 1,000 mg by mouth every 12 (twelve) hours.      [name] morphine (MS CONTIN) 60 MG 12 hr tablet Take 1 tablet (60 mg total) by mouth 3 (three) times daily.  90 tablet  0  . Multiple Vitamin (MULTIVITAMIN) capsule Take 1 capsule by mouth daily. NO IRON      . NITROSTAT 0.4 MG SL tablet       . polyethylene glycol powder (GLYCOLAX/MIRALAX) powder Using measuring cap mix  17gm in water and drink two times daily  3162 g  11  . pravastatin (PRAVACHOL) 40 MG tablet       . Psyllium (METAMUCIL PO) Take 17 g/day by mouth 2 (two) times daily.      [name] zolpidem (AMBIEN) 10 MG tablet Take 10 mg by mouth  at bedtime as needed.       No current facility-administered medications for this visit.     Allergies:  Allergies  Allergen Reactions  . Amlodipine Besy-Benazepril Hcl   . Naproxen     Past Medical History, Surgical history, Social history, and Family History were reviewed and updated.  [name] Physical Exam: Blood pressure 125/69, pulse 80, temperature 97.4 F (36.3 C), temperature source Oral, resp. rate 18, height 6\' 3"  (1.905 m), weight 259 lb 12.8 oz (117.845 kg). ECOG: 1 General appearance: alert and cooperative Head: Normocephalic, without obvious abnormality, atraumatic Neck: no adenopathy Lymph nodes: Cervical, supraclavicular, and axillary nodes normal. Heart:regular rate and rhythm, S1, S2 normal, no murmur, click, rub or gallop Lung:chest clear, no wheezing, rales, normal symmetric air entry. Abdomin: soft, non-tender, without masses  or organomegaly EXT:no erythema, induration, or nodules   Lab Results: Lab Results  Component Value Date   WBC 4.1 08/16/2013   HGB 13.7 08/16/2013   HCT 42.3 08/16/2013   MCV 86.9 08/16/2013   PLT 123* 08/16/2013     Chemistry      Component Value Date/Time   NA 140 09/21/2012 1052   NA 138 04/20/2007 0825   K 4.0 09/21/2012 1052   K 3.9 04/20/2007 0825   CL 101 03/18/2007 0203   CO2 26 09/21/2012 1052   CO2 29 03/18/2007 0203   BUN 14.0 09/21/2012 1052   BUN 10 03/18/2007 0203   CREATININE 0.9 09/21/2012 1052   CREATININE 0.77 03/18/2007 0203      Component Value Date/Time   CALCIUM 9.4 09/21/2012 1052   CALCIUM 8.9 03/18/2007 0203   ALKPHOS 53 09/21/2012 1052   AST 48* 09/21/2012 1052   ALT 52 09/21/2012 1052   BILITOT 2.10* 09/21/2012 1052        Impression and Plan:  63 year old gentleman with the following issues:  1. Hemachromatosis: he is currently on phlebotomy to keep his ferritin less than 50. He has tolerated  phlebotomies on a monthly basis without any complications. His last ferritin was 47 in 05/2013. The plan is increased the interval between phlebotomies at this time to every 3 months rather than monthly. I will reevaluate him in about 3 months and recheck his ferritin, if it's elevated we will proceed with a phlebotomy. His ferritin from today is pending and if it is above 50 will perform a phlebotomy in the next few days.  2. Cirrhosis of the liver: He follows up with gastroenterology regarding that.  3. Thrombocytopenia: This is likely related to his cirrhosis of the liver and his count remained relatively stable.   [facility], MD 7/15/201510:00 AM

## 2013-08-16 NOTE — Telephone Encounter (Signed)
gv adn printed appts sched adn avs for pt for OCT

## 2013-08-16 NOTE — Telephone Encounter (Signed)
lmonvm for pt confirming appt for 7/17. pt given other appts prior to leaving today.

## 2013-08-16 NOTE — Telephone Encounter (Signed)
Per desk RN canceled appts for today and moved to Friday

## 2013-08-16 NOTE — Telephone Encounter (Signed)
Per MD, informed patient of ferritin results and that he needs to be r/s for a phlebotomy this week. Patient states Thrusday after 1pm or Friday Morning works best. Patient knows to expect call to sched appt.  POF sent and Sharyn Lull, tx room, informed.

## 2013-08-18 ENCOUNTER — Ambulatory Visit (HOSPITAL_BASED_OUTPATIENT_CLINIC_OR_DEPARTMENT_OTHER): Payer: Medicare Other

## 2013-08-18 NOTE — Patient Instructions (Signed)

## 2013-08-28 ENCOUNTER — Other Ambulatory Visit: Payer: Self-pay | Admitting: Gastroenterology

## 2013-08-28 DIAGNOSIS — K7460 Unspecified cirrhosis of liver: Secondary | ICD-10-CM

## 2013-09-06 ENCOUNTER — Ambulatory Visit
Admission: RE | Admit: 2013-09-06 | Discharge: 2013-09-06 | Disposition: A | Discharge status: Home / Self Care | Payer: Medicare Other | Source: Ambulatory Visit | Attending: Gastroenterology | Admitting: Gastroenterology

## 2013-09-06 DIAGNOSIS — K7460 Unspecified cirrhosis of liver: Secondary | ICD-10-CM

## 2013-11-06 ENCOUNTER — Encounter: Payer: Medicare Other | Attending: Physical Medicine & Rehabilitation | Admitting: Physical Medicine & Rehabilitation

## 2013-11-06 ENCOUNTER — Encounter: Payer: Self-pay | Admitting: Physical Medicine & Rehabilitation

## 2013-11-06 VITALS — BP 137/81 | HR 80 | Resp 14 | Wt 260.0 lb

## 2013-11-06 DIAGNOSIS — M459 Ankylosing spondylitis of unspecified sites in spine: Secondary | ICD-10-CM | POA: Diagnosis present

## 2013-11-06 DIAGNOSIS — M7060 Trochanteric bursitis, unspecified hip: Secondary | ICD-10-CM

## 2013-11-06 MED ORDER — HYDROCODONE-ACETAMINOPHEN 7.5-325 MG PO TABS: 1.0000 | ORAL_TABLET | Freq: Four times a day (QID) | ORAL | Status: DC | PRN

## 2013-11-06 MED ORDER — MORPHINE SULFATE ER 60 MG PO TBCR: 60.0000 mg | EXTENDED_RELEASE_TABLET | Freq: Three times a day (TID) | ORAL | Status: DC

## 2013-11-06 NOTE — Patient Instructions (Signed)
LOOK INTO THE [facility] PROGRAM AT THE [facility]

## 2013-11-06 NOTE — Progress Notes (Signed)
Subjective:    Patient ID: Daniel Rivers, male    DOB: 1950/04/12, 63 y.o.   MRN: [id number]  HPI  Daniel Rivers is back regarding his chronic pain. He has been fairly steady. He has tried to me a little more active lately. He always feels better when he's moving.   He just got back from the beach where he fished. He continues on his MS contin/norco for his pain control. They remain fairly effective.    Pain Inventory Average Pain 5 Pain Right Now 6 My pain is sharp, burning, dull, stabbing, tingling and aching  In the last 24 hours, has pain interfered with the following? General activity 5 Relation with others 3 Enjoyment of life 4 What TIME of day is your pain at its worst? evening Sleep (in general) Fair  Pain is worse with: walking, bending, sitting, standing and some activites Pain improves with: rest, heat/ice, pacing activities and medication Relief from Meds: 5  Mobility walk without assistance  Function Do you have any goals in this area?  no  Neuro/Psych No problems in this area  Prior Studies Any changes since last visit?  no  Physicians involved in your care Any changes since last visit?  no   History reviewed. No pertinent family history. History   Social History  . Marital Status: Married    Spouse Name: N/A    Number of Children: N/A  . Years of Education: N/A   Social History Main Topics  . Smoking status: Never Smoker   . Smokeless tobacco: None  . Alcohol Use: None  . Drug Use: None  . Sexual Activity: None   Other Topics Concern  . None   Social History Narrative  . None   Past Surgical History  Procedure Laterality Date  . Cortisone injection    . Small intestine surgery    . Spine surgery    . Neuroplasty / transposition median nerve at carpal tunnel bilateral    . Elbow surgery    . Rectal fissure repair    . Rotator cuff repair    . Stint placement     Past Medical History  Diagnosis Date  . Ankylosing spondylitis  of lumbar region   . Ankylosing spondylitis of cervical region   . Obesity   . Bursitis, trochanteric   . Rotator cuff syndrome   . Tendonitis   . Depression   . Anxiety   . Back pain   . Hypertension   . Diabetes mellitus    BP 137/81  Pulse 80  Resp 14  Wt 260 lb (117.935 kg)  SpO2 97%  Opioid Risk Score:   Fall Risk Score: Moderate Fall Risk (6-13 points) (previoulsy educated and givne handout)  Review of Systems  Musculoskeletal: Positive for back pain, gait problem and neck pain.  All other systems reviewed and are negative.      Objective:   Physical Exam  Constitutional: He is oriented to person, place, and time. He appears well-developed and well-nourished.  HENT:  Head: Normocephalic and atraumatic.  Nose: Nose normal.  Mouth/Throat: Oropharynx is clear and moist.  Eyes: Conjunctivae are normal. Pupils are equal, round, and reactive to light.  Cardiovascular: Normal rate and regular rhythm.  Pulmonary/Chest: Effort normal. No respiratory distress. He has no wheezes.  Abdominal: He exhibits no distension. There is no tenderness.  Musculoskeletal:  Left shoulder: He exhibits decreased range of motion, tenderness and bony tenderness.  Lumbar back: He exhibits decreased range of  motion, tenderness, bony tenderness, swelling and edema.  Neurological: He is alert and oriented to person, place, and time.  He is able to bend and flex at the waist and go to 90 degrees. cannont extend neck to neutral (-10 degrees). He transfers easily from a seated to standing position and balance is good. He uses a cane for balance as well.    Assessment & Plan:   ASSESSMENT:  1. 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  2. Left and right greater trochanter bursitis.  3. Bilateral rotator cuff syndrome.  4. Left bicipital tendonitis.  5. Hypo-testosterone state     PLAN:  1. Continue with HEP. Needs to maintain exercises and stretches every day.  2. Current  medication regimen is still effective. Refilled morphine  60 mg CR q.8 hours #270 and Norco 7.5, #270 with 3 month  prescriptions were written today.  3. Mobic PRN for flares.  4. Off testosterone given urinary flow issues.  5. He will see [place] back in 3 months. All questions were encouraged and answered

## 2013-11-08 ENCOUNTER — Telehealth: Payer: Self-pay | Admitting: Oncology

## 2013-11-08 ENCOUNTER — Ambulatory Visit (HOSPITAL_BASED_OUTPATIENT_CLINIC_OR_DEPARTMENT_OTHER): Payer: Medicare Other | Admitting: Oncology

## 2013-11-08 ENCOUNTER — Other Ambulatory Visit (HOSPITAL_BASED_OUTPATIENT_CLINIC_OR_DEPARTMENT_OTHER): Payer: Medicare Other

## 2013-11-08 ENCOUNTER — Encounter: Payer: Self-pay | Admitting: Oncology

## 2013-11-08 VITALS — BP 128/76 | HR 97 | Temp 98.5°F | Resp 18 | Ht 75.0 in | Wt 255.1 lb

## 2013-11-08 DIAGNOSIS — Z862 Personal history of diseases of the blood and blood-forming organs and certain disorders involving the immune mechanism: Secondary | ICD-10-CM

## 2013-11-08 DIAGNOSIS — K7460 Unspecified cirrhosis of liver: Secondary | ICD-10-CM

## 2013-11-08 DIAGNOSIS — D696 Thrombocytopenia, unspecified: Secondary | ICD-10-CM

## 2013-11-08 LAB — CBC WITH DIFFERENTIAL/PLATELET
BASO%: 1 % (ref 0.0–2.0)
Basophils Absolute: 0.1 10*3/uL (ref 0.0–0.1)
EOS ABS: 0.1 10*3/uL (ref 0.0–0.5)
EOS%: 1.4 % (ref 0.0–7.0)
HCT: 44.2 % (ref 38.4–49.9)
HEMOGLOBIN: 14.4 g/dL (ref 13.0–17.1)
LYMPH#: 1.6 10*3/uL (ref 0.9–3.3)
LYMPH%: 30.4 % (ref 14.0–49.0)
MCH: 29.2 pg (ref 27.2–33.4)
MCHC: 32.7 g/dL (ref 32.0–36.0)
MCV: 89.3 fL (ref 79.3–98.0)
MONO#: 0.5 10*3/uL (ref 0.1–0.9)
MONO%: 10.5 % (ref 0.0–14.0)
NEUT%: 56.7 % (ref 39.0–75.0)
NEUTROS ABS: 3 10*3/uL (ref 1.5–6.5)
Platelets: 149 10*3/uL (ref 140–400)
RBC: 4.94 10*6/uL (ref 4.20–5.82)
RDW: 14.3 % (ref 11.0–14.6)
WBC: 5.3 10*3/uL (ref 4.0–10.3)

## 2013-11-08 LAB — COMPREHENSIVE METABOLIC PANEL (CC13)
ALBUMIN: 4 g/dL (ref 3.5–5.0)
ALT: 46 U/L (ref 0–55)
AST: 48 U/L — AB (ref 5–34)
Alkaline Phosphatase: 62 U/L (ref 40–150)
Anion Gap: 9 mEq/L (ref 3–11)
BUN: 16.6 mg/dL (ref 7.0–26.0)
CALCIUM: 10.4 mg/dL (ref 8.4–10.4)
CHLORIDE: 99 meq/L (ref 98–109)
CO2: 31 mEq/L — ABNORMAL HIGH (ref 22–29)
Creatinine: 1 mg/dL (ref 0.7–1.3)
GLUCOSE: 152 mg/dL — AB (ref 70–140)
POTASSIUM: 4 meq/L (ref 3.5–5.1)
SODIUM: 140 meq/L (ref 136–145)
TOTAL PROTEIN: 7.9 g/dL (ref 6.4–8.3)
Total Bilirubin: 1.45 mg/dL — ABNORMAL HIGH (ref 0.20–1.20)

## 2013-11-08 LAB — FERRITIN CHCC: Ferritin: 77 ng/ml (ref 22–316)

## 2013-11-08 NOTE — Progress Notes (Signed)
Hematology and Oncology Follow Up Visit  Daniel Rivers [id number] 1950-02-15 63 y.o. 11/08/2013 9:48 AM [name], MDPang, Richard, MD   Principle Diagnosis: 63 year old with hemochromatosis and liver cirrhosis diagnosed in 2009. He established care at the [facility] in August of 2014.   Prior Therapy: He have been receiving intermittent phlebotomies in the past.  Current therapy: He have been receiving a monthly phlebotomies to achieve ferritin level of less than 50.  Interim History:  Daniel Rivers presents today for a followup visit. Since his last visit, he has been doing very well. He tolerated antiemetic and phlebotomies without any complications. He is not reported any fatigue or tiredness or difficulty breathing. He had not reported any recent illnesses or hospitalizations. His energy and performance status is about the same. He has not reported any chest pain or shortness of breath or difficulty breathing. Had not reported any GI or GU symptoms. He has not reported any headaches or blurry vision or alteration of his mental status. Has not had any genitourinary complaints of frequency urgency or hesitancy. Has not reported any lymphadenopathy or petechiae. Has not reported any easy bruisability or any bleeding. The rest of his review of symptoms is unremarkable.   Medications: I have reviewed the patient's current medications.  Current Outpatient Prescriptions  Medication Sig Dispense Refill  . ACCU-CHEK FASTCLIX LANCETS MISC       . aspirin 81 MG tablet Take 160 mg by mouth daily.      . bisacodyl (DULCOLAX) 5 MG EC tablet Take 5 mg by mouth at bedtime. 2 at bedtime      . buPROPion (WELLBUTRIN XL) 300 MG 24 hr tablet       . carvedilol (COREG) 3.125 MG tablet Take 3.125 mg by mouth 2 (two) times daily.      . Cholecalciferol (VITAMIN D3) 2000 UNITS TABS Take 1 tablet by mouth daily.      . furosemide (LASIX) 20 MG tablet       . glimepiride (AMARYL) 2 MG tablet Take  2 mg by mouth daily before breakfast. Takes 1/2 every morning      . HYDROcodone-acetaminophen (NORCO) 7.5-325 MG per tablet Take 1 tablet by mouth every 6 (six) hours as needed. 1 month supply  120 tablet  0  . KRILL OIL PO Take by mouth.      . losartan (COZAAR) 100 MG tablet Take 100 mg by mouth daily.      . Magnesium Oxide 400 MG CAPS Take 1 capsule by mouth daily.      . meloxicam (MOBIC) 7.5 MG tablet Take 1 tablet by mouth  daily  90 tablet  3  . metFORMIN (GLUCOPHAGE) 1000 MG tablet Take 1,000 mg by mouth every 12 (twelve) hours.      [name] morphine (MS CONTIN) 60 MG 12 hr tablet Take 1 tablet (60 mg total) by mouth 3 (three) times daily.  90 tablet  0  . Multiple Vitamin (MULTIVITAMIN) capsule Take 1 capsule by mouth daily. NO IRON      . NITROSTAT 0.4 MG SL tablet       . polyethylene glycol powder (GLYCOLAX/MIRALAX) powder Using measuring cap mix  17gm in water and drink two times daily  3162 g  11  . pravastatin (PRAVACHOL) 40 MG tablet       . Psyllium (METAMUCIL PO) Take 17 g/day by mouth 2 (two) times daily.      [name] zolpidem (AMBIEN) 10 MG tablet Take 10  mg by mouth at bedtime as needed.       No current facility-administered medications for this visit.     Allergies:  Allergies  Allergen Reactions  . Amlodipine Besy-Benazepril Hcl   . Naproxen     Past Medical History, Surgical history, Social history, and Family History were reviewed and updated.  [name] Physical Exam: Blood pressure 128/76, pulse 97, temperature 98.5 F (36.9 C), temperature source Oral, resp. rate 18, height 6\' 3"  (1.905 m), weight 255 lb 1.6 oz (115.713 kg), SpO2 98.00%. ECOG: 1 General appearance: alert and cooperative Head: Normocephalic, without obvious abnormality Neck: no adenopathy Lymph nodes: Cervical, supraclavicular, and axillary nodes normal. Heart:regular rate and rhythm, S1, S2 normal, no murmur, click, rub or gallop Lung:chest clear, no wheezing, rales, normal symmetric air  entry. Abdomin: soft, non-tender, without masses or organomegaly EXT:no erythema, induration, or nodules   Lab Results: Lab Results  Component Value Date   WBC 5.3 11/08/2013   HGB 14.4 11/08/2013   HCT 44.2 11/08/2013   MCV 89.3 11/08/2013   PLT 149 11/08/2013     Chemistry      Component Value Date/Time   NA 140 08/16/2013 0925   NA 138 04/20/2007 0825   K 4.2 08/16/2013 0925   K 3.9 04/20/2007 0825   CL 101 03/18/2007 0203   CO2 27 08/16/2013 0925   CO2 29 03/18/2007 0203   BUN 11.7 08/16/2013 0925   BUN 10 03/18/2007 0203   CREATININE 1.0 08/16/2013 0925   CREATININE 0.77 03/18/2007 0203      Component Value Date/Time   CALCIUM 9.7 08/16/2013 0925   CALCIUM 8.9 03/18/2007 0203   ALKPHOS 55 08/16/2013 0925   AST 46* 08/16/2013 0925   ALT 35 08/16/2013 0925   BILITOT 1.34* 08/16/2013 0925        Impression and Plan:  63 year old gentleman with the following issues:  1. Hemachromatosis: he is currently on phlebotomy to keep his ferritin less than 50. His last phlebotomy was close to 3 months ago with his ferritin was around 60. His ferritin today is currently pending and will proceed with phlebotomy as his ferritin is above 50. We'll continue to check another 3 months and proceed with phlebotomies as needed.  2. Cirrhosis of the liver: He follows up with gastroenterology regarding that. He appears to be stable  3. Thrombocytopenia: This is likely related to his cirrhosis of the liver and his counts are normal today.   RDEYCX,KGYJE, MD 10/7/20159:48 AM

## 2013-11-08 NOTE — Telephone Encounter (Signed)
gv adn printed appt sched and avs for pt for OCT and Jan 2016.....sed added tx.

## 2013-11-10 ENCOUNTER — Ambulatory Visit: Payer: Medicare Other

## 2013-11-10 ENCOUNTER — Telehealth: Payer: Self-pay | Admitting: Medical Oncology

## 2013-11-10 VITALS — BP 108/61 | HR 89 | Temp 98.2°F | Resp 18

## 2013-11-10 DIAGNOSIS — Z862 Personal history of diseases of the blood and blood-forming organs and certain disorders involving the immune mechanism: Secondary | ICD-10-CM

## 2013-11-10 NOTE — Progress Notes (Unsigned)
Phlebotomy performed without incident. Patient tolerated well.  VSS before and after. Removed 530 mls.

## 2013-11-10 NOTE — Telephone Encounter (Signed)
Patient called requesting ferritin results to see if he needs a phlebotomy. Results given, 29, patient knows to keep phlebotomy appt today.

## 2013-11-10 NOTE — Patient Instructions (Signed)

## 2014-01-05 ENCOUNTER — Telehealth: Payer: Self-pay | Admitting: *Deleted

## 2014-01-05 DIAGNOSIS — M459 Ankylosing spondylitis of unspecified sites in spine: Secondary | ICD-10-CM

## 2014-01-05 MED ORDER — HYDROCODONE-ACETAMINOPHEN 7.5-325 MG PO TABS: 1.0000 | ORAL_TABLET | Freq: Four times a day (QID) | ORAL | Status: DC | PRN

## 2014-01-05 MED ORDER — MORPHINE SULFATE ER 60 MG PO TBCR: 60.0000 mg | EXTENDED_RELEASE_TABLET | Freq: Three times a day (TID) | ORAL | Status: DC

## 2014-01-05 NOTE — Telephone Encounter (Signed)
Patient called and stated that he will be out of meds tomorrow.  Next appt February 05, 2014 as per Dr. Charm Barges note (3 month follow up). Printed out RX for ZS to sign. Will call patient and let him know that he must be here by 3pm for pick up and that we will no longer be refilling prescriptions on Friday's... Must give [place] atleast 48 hours for prescription refills

## 2014-01-05 NOTE — Telephone Encounter (Signed)
Called patient and told him that the RX's were ready and he can come and pick them up.  Also stated that he needs to give [place] 48 hours for future refills. Patient was thankful and will pick up before 3pm today

## 2014-02-05 ENCOUNTER — Encounter: Payer: Medicare Other | Attending: Physical Medicine & Rehabilitation | Admitting: Registered Nurse

## 2014-02-05 ENCOUNTER — Encounter: Payer: Self-pay | Admitting: Registered Nurse

## 2014-02-05 ENCOUNTER — Other Ambulatory Visit: Payer: Self-pay | Admitting: Physical Medicine & Rehabilitation

## 2014-02-05 VITALS — BP 114/82 | HR 85 | Resp 14

## 2014-02-05 DIAGNOSIS — M459 Ankylosing spondylitis of unspecified sites in spine: Secondary | ICD-10-CM | POA: Diagnosis not present

## 2014-02-05 DIAGNOSIS — G894 Chronic pain syndrome: Secondary | ICD-10-CM

## 2014-02-05 DIAGNOSIS — Z5181 Encounter for therapeutic drug level monitoring: Secondary | ICD-10-CM

## 2014-02-05 DIAGNOSIS — Z79899 Other long term (current) drug therapy: Secondary | ICD-10-CM

## 2014-02-05 DIAGNOSIS — M7061 Trochanteric bursitis, right hip: Secondary | ICD-10-CM

## 2014-02-05 MED ORDER — MORPHINE SULFATE ER 60 MG PO TBCR: 60.0000 mg | EXTENDED_RELEASE_TABLET | Freq: Three times a day (TID) | ORAL | Status: DC

## 2014-02-05 MED ORDER — HYDROCODONE-ACETAMINOPHEN 7.5-325 MG PO TABS: 1.0000 | ORAL_TABLET | Freq: Four times a day (QID) | ORAL | Status: DC | PRN

## 2014-02-05 NOTE — Progress Notes (Signed)
Subjective:    Patient ID: Daniel Rivers, male    DOB: [birth year], 64 y.o.   MRN: [id number]  HPI: [name] is a 64 year male who returns for chronic pain and medication refill. He says his pain is located in his neck, right hip and left leg. He rates his pain 5. His current exercise regime is performing stretching exercises and walking.  Pain Inventory Average Pain 6 Pain Right Now 5 My pain is sharp, burning, dull, stabbing, tingling and aching  In the last 24 hours, has pain interfered with the following? General activity 4 Relation with others 2 Enjoyment of life 5 What TIME of day is your pain at its worst? daytime, evening Sleep (in general) Fair  Pain is worse with: walking, bending, sitting, inactivity, standing and some activites Pain improves with: rest, heat/ice, pacing activities and medication Relief from Meds: 5  Mobility use a cane Do you have any goals in this area?  no  Function Do you have any goals in this area?  no  Neuro/Psych weakness numbness tingling trouble walking dizziness confusion depression anxiety  Prior Studies Any changes since last visit?  no  Physicians involved in your care Any changes since last visit?  no   History reviewed. No pertinent family history. History   Social History  . Marital Status: Married    Spouse Name: N/A    Number of Children: N/A  . Years of Education: N/A   Social History Main Topics  . Smoking status: Never Smoker   . Smokeless tobacco: None  . Alcohol Use: None  . Drug Use: None  . Sexual Activity: None   Other Topics Concern  . None   Social History Narrative   Past Surgical History  Procedure Laterality Date  . Cortisone injection    . Small intestine surgery    . Spine surgery    . Neuroplasty / transposition median nerve at carpal tunnel bilateral    . Elbow surgery    . Rectal fissure repair    . Rotator cuff repair    . Stint placement     Past Medical  History  Diagnosis Date  . Ankylosing spondylitis of lumbar region   . Ankylosing spondylitis of cervical region   . Obesity   . Bursitis, trochanteric   . Rotator cuff syndrome   . Tendonitis   . Depression   . Anxiety   . Back pain   . Hypertension   . Diabetes mellitus    BP 114/82 mmHg  Pulse 85  Resp 14  SpO2 96%  Opioid Risk Score:   Fall Risk Score: Moderate Fall Risk (6-13 points) Review of Systems  Musculoskeletal: Positive for gait problem.  Neurological: Positive for dizziness, weakness and numbness.       Tingling  Psychiatric/Behavioral: Positive for confusion and dysphoric mood. The patient is nervous/anxious.   All other systems reviewed and are negative.      Objective:   Physical Exam  Constitutional: He is oriented to person, place, and time. He appears well-developed and well-nourished.  HENT:  Head: Normocephalic and atraumatic.  Neck: Normal range of motion. Neck supple.  Cardiovascular: Normal rate and regular rhythm.   Pulmonary/Chest: Effort normal and breath sounds normal.  Musculoskeletal:  Normal Muscle Bulk and Muscle Testing Reveals: Upper Extremities: Full ROM and Muscle Strength 5/5 Right Greater Trochanteric tenderness Lower Extremities: Full ROM and Muscle Strength 5/5 Arises from chair with ease using straight cane for  support   Neurological: He is alert and oriented to person, place, and time.  Skin: Skin is warm and dry.  Psychiatric: He has a normal mood and affect.  Nursing note and vitals reviewed.         Assessment & Plan:  1. 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 Total of three script's given. 2. Right greater trochanter bursitis: Continue With Mobic and exercise and current medication regime.  20 minutes of face to face patient care time was spent during this visit. All  questions were encouraged and answered  F/U in 3 month

## 2014-02-06 LAB — PMP ALCOHOL METABOLITE (ETG): Ethyl Glucuronide (EtG): NEGATIVE ng/mL

## 2014-02-07 ENCOUNTER — Other Ambulatory Visit: Payer: Medicare Other

## 2014-02-07 ENCOUNTER — Telehealth: Payer: Self-pay | Admitting: Oncology

## 2014-02-07 NOTE — Telephone Encounter (Signed)
Pt called to r/s labs for next day.... KJ

## 2014-02-08 ENCOUNTER — Telehealth: Payer: Self-pay | Admitting: Oncology

## 2014-02-08 ENCOUNTER — Other Ambulatory Visit (HOSPITAL_BASED_OUTPATIENT_CLINIC_OR_DEPARTMENT_OTHER): Payer: Medicare Other

## 2014-02-08 DIAGNOSIS — Z862 Personal history of diseases of the blood and blood-forming organs and certain disorders involving the immune mechanism: Secondary | ICD-10-CM

## 2014-02-08 LAB — CBC WITH DIFFERENTIAL/PLATELET
BASO%: 1 % (ref 0.0–2.0)
Basophils Absolute: 0 10*3/uL (ref 0.0–0.1)
EOS%: 1.4 % (ref 0.0–7.0)
Eosinophils Absolute: 0.1 10*3/uL (ref 0.0–0.5)
HCT: 41.6 % (ref 38.4–49.9)
HGB: 13.6 g/dL (ref 13.0–17.1)
LYMPH#: 1.2 10*3/uL (ref 0.9–3.3)
LYMPH%: 30 % (ref 14.0–49.0)
MCH: 29 pg (ref 27.2–33.4)
MCHC: 32.6 g/dL (ref 32.0–36.0)
MCV: 89 fL (ref 79.3–98.0)
MONO#: 0.3 10*3/uL (ref 0.1–0.9)
MONO%: 7.7 % (ref 0.0–14.0)
NEUT%: 59.9 % (ref 39.0–75.0)
NEUTROS ABS: 2.4 10*3/uL (ref 1.5–6.5)
Platelets: 117 10*3/uL — ABNORMAL LOW (ref 140–400)
RBC: 4.68 10*6/uL (ref 4.20–5.82)
RDW: 13.8 % (ref 11.0–14.6)
WBC: 4.1 10*3/uL (ref 4.0–10.3)

## 2014-02-08 LAB — OPIATES/OPIOIDS (LC/MS-MS)
CODEINE URINE: NEGATIVE ng/mL (ref <50)
Hydrocodone: 67 ng/mL (ref <50)
Hydromorphone: 405 ng/mL (ref <50)
Norhydrocodone, Ur: 215 ng/mL (ref <50)
Noroxycodone, Ur: NEGATIVE ng/mL (ref <50)
Oxycodone, ur: NEGATIVE ng/mL (ref <50)
Oxymorphone: NEGATIVE ng/mL (ref <50)

## 2014-02-08 LAB — FERRITIN CHCC: Ferritin: 69 ng/ml (ref 22–316)

## 2014-02-08 LAB — ZOLPIDEM (LC/MS-MS), URINE
ZOLPIDEM METABOLITE (GC/LS/MS) UR, CONFIRM: 2050 ng/mL (ref <5)
Zolpidem (GC/LC/MS), Ur confirm: 45 ng/mL (ref <5)

## 2014-02-08 LAB — COMPREHENSIVE METABOLIC PANEL (CC13)
ALT: 33 U/L (ref 0–55)
ANION GAP: 9 meq/L (ref 3–11)
AST: 37 U/L — ABNORMAL HIGH (ref 5–34)
Albumin: 3.9 g/dL (ref 3.5–5.0)
Alkaline Phosphatase: 58 U/L (ref 40–150)
BILIRUBIN TOTAL: 1.35 mg/dL — AB (ref 0.20–1.20)
BUN: 17.2 mg/dL (ref 7.0–26.0)
CO2: 29 meq/L (ref 22–29)
CREATININE: 1.1 mg/dL (ref 0.7–1.3)
Calcium: 9.5 mg/dL (ref 8.4–10.4)
Chloride: 101 mEq/L (ref 98–109)
EGFR: 69 mL/min/{1.73_m2} — ABNORMAL LOW (ref >90)
GLUCOSE: 155 mg/dL — AB (ref 70–140)
Potassium: 4 mEq/L (ref 3.5–5.1)
Sodium: 140 mEq/L (ref 136–145)
TOTAL PROTEIN: 7.1 g/dL (ref 6.4–8.3)

## 2014-02-08 NOTE — Telephone Encounter (Signed)
pt cld left vm-cld about appt-cld pt back left message to give [place] a call back

## 2014-02-09 ENCOUNTER — Ambulatory Visit (HOSPITAL_BASED_OUTPATIENT_CLINIC_OR_DEPARTMENT_OTHER): Payer: Medicare Other | Admitting: Oncology

## 2014-02-09 ENCOUNTER — Telehealth: Payer: Self-pay | Admitting: Oncology

## 2014-02-09 ENCOUNTER — Ambulatory Visit (HOSPITAL_BASED_OUTPATIENT_CLINIC_OR_DEPARTMENT_OTHER): Payer: Medicare Other

## 2014-02-09 VITALS — BP 133/73 | HR 77 | Temp 97.6°F | Resp 18 | Ht 75.0 in | Wt 258.1 lb

## 2014-02-09 DIAGNOSIS — K7460 Unspecified cirrhosis of liver: Secondary | ICD-10-CM

## 2014-02-09 DIAGNOSIS — D696 Thrombocytopenia, unspecified: Secondary | ICD-10-CM

## 2014-02-09 DIAGNOSIS — Z862 Personal history of diseases of the blood and blood-forming organs and certain disorders involving the immune mechanism: Secondary | ICD-10-CM

## 2014-02-09 LAB — PRESCRIPTION MONITORING PROFILE (SOLSTAS)
AMPHETAMINE/METH: NEGATIVE ng/mL
BENZODIAZEPINE SCREEN, URINE: NEGATIVE ng/mL
Barbiturate Screen, Urine: NEGATIVE ng/mL
Buprenorphine, Urine: NEGATIVE ng/mL
CANNABINOID SCRN UR: NEGATIVE ng/mL
CARISOPRODOL, URINE: NEGATIVE ng/mL
Cocaine Metabolites: NEGATIVE ng/mL
Creatinine, Urine: 141.94 mg/dL (ref >20.0)
FENTANYL URINE: NEGATIVE ng/mL
MDMA URINE: NEGATIVE ng/mL
MEPERIDINE UR: NEGATIVE ng/mL
METHADONE SCREEN, URINE: NEGATIVE ng/mL
NITRITES URINE, INITIAL: NEGATIVE ug/mL
OXYCODONE SCRN UR: NEGATIVE ng/mL
PH URINE, INITIAL: 5.6 pH (ref 4.5–8.9)
Propoxyphene: NEGATIVE ng/mL
TAPENTADOLUR: NEGATIVE ng/mL
Tramadol Scrn, Ur: NEGATIVE ng/mL

## 2014-02-09 NOTE — Patient Instructions (Signed)

## 2014-02-09 NOTE — Progress Notes (Signed)
Hematology and Oncology Follow Up Visit  Daniel Rivers [id number] 1950/07/06 64 y.o. 02/09/2014 3:08 PM Daniel Rivers, MDPang, Richard, MD   Principle Diagnosis: 64 year old with hemochromatosis and liver cirrhosis diagnosed in 2009. He established care at the [facility] in August of 2014.   Prior Therapy: He have been receiving intermittent phlebotomies in the past.  Current therapy: He have been receiving a monthly phlebotomies to achieve ferritin level of less than 50.  Interim History:  Daniel Rivers presents today for a followup visit. Since his last visit, he reports no new issues. He tolerated phlebotomies without any complications. He is not reported any fatigue or tiredness or difficulty breathing. He had not reported any recent illnesses or hospitalizations. His energy and performance status is about the same. He has not reported any chest pain or shortness of breath or difficulty breathing.  He has not reported any headaches or blurry vision or alteration of his mental status. Has not had any genitourinary complaints of frequency urgency or hesitancy. Has not reported any lymphadenopathy or petechiae. Has not reported any easy bruisability or any bleeding. The rest of his review of symptoms is unremarkable.   Medications: I have reviewed the patient's current medications.  Current Outpatient Prescriptions  Medication Sig Dispense Refill  . ACCU-CHEK FASTCLIX LANCETS MISC     . aspirin 81 MG tablet Take 160 mg by mouth daily.    . bisacodyl (DULCOLAX) 5 MG EC tablet Take 5 mg by mouth at bedtime. 2 at bedtime    . buPROPion (WELLBUTRIN XL) 300 MG 24 hr tablet     . carvedilol (COREG) 3.125 MG tablet Take 3.125 mg by mouth 2 (two) times daily.    . Cholecalciferol (VITAMIN D3) 2000 UNITS TABS Take 1 tablet by mouth daily.    . furosemide (LASIX) 20 MG tablet     . glimepiride (AMARYL) 2 MG tablet Take 2 mg by mouth daily before breakfast. Takes 1/2 every morning    .  HYDROcodone-acetaminophen (NORCO) 7.5-325 MG per tablet Take 1 tablet by mouth every 6 (six) hours as needed. 1 month supply 120 tablet 0  . KRILL OIL PO Take by mouth.    . losartan (COZAAR) 100 MG tablet Take 100 mg by mouth daily.    . Magnesium Oxide 400 MG CAPS Take 1 capsule by mouth daily.    . meloxicam (MOBIC) 7.5 MG tablet Take 1 tablet by mouth  daily 90 tablet 3  . metFORMIN (GLUCOPHAGE) 1000 MG tablet Take 1,000 mg by mouth every 12 (twelve) hours.    [name] morphine (MS CONTIN) 60 MG 12 hr tablet Take 1 tablet (60 mg total) by mouth 3 (three) times daily. 90 tablet 0  . Multiple Vitamin (MULTIVITAMIN) capsule Take 1 capsule by mouth daily. NO IRON    . NITROSTAT 0.4 MG SL tablet     . polyethylene glycol powder (GLYCOLAX/MIRALAX) powder Using measuring cap mix  17gm in water and drink two times daily 3162 g 11  . pravastatin (PRAVACHOL) 40 MG tablet     . Psyllium (METAMUCIL PO) Take 17 g/day by mouth 2 (two) times daily.    [name] zolpidem (AMBIEN) 10 MG tablet Take 10 mg by mouth at bedtime as needed.     No current facility-administered medications for this visit.     Allergies:  Allergies  Allergen Reactions  . Amlodipine Besy-Benazepril Hcl   . Naproxen     Past Medical History, Surgical history, Social history, and Family  History were reviewed and updated.  [name] Physical Exam: Blood pressure 133/73, pulse 77, temperature 97.6 F (36.4 C), temperature source Oral, resp. rate 18, height 6\' 3"  (1.905 m), weight 258 lb 1.6 oz (117.073 kg), SpO2 100 %. ECOG: 1 General appearance: alert and cooperative. Not in any distress.  Head: Normocephalic, without obvious abnormality Neck: no adenopathy Lymph nodes: Cervical, supraclavicular, and axillary nodes normal. Heart:regular rate and rhythm, S1, S2 normal, no murmur, click, rub or gallop Lung:chest clear, no wheezing, rales, normal symmetric air entry. Abdomin: soft, non-tender, without masses or organomegaly EXT:no erythema,  induration, or nodules   Lab Results: Lab Results  Component Value Date   WBC 4.1 02/08/2014   HGB 13.6 02/08/2014   HCT 41.6 02/08/2014   MCV 89.0 02/08/2014   PLT 117* 02/08/2014     Chemistry      Component Value Date/Time   NA 140 02/08/2014 1030   NA 138 04/20/2007 0825   K 4.0 02/08/2014 1030   K 3.9 04/20/2007 0825   CL 101 03/18/2007 0203   CO2 29 02/08/2014 1030   CO2 29 03/18/2007 0203   BUN 17.2 02/08/2014 1030   BUN 10 03/18/2007 0203   CREATININE 1.1 02/08/2014 1030   CREATININE 0.77 03/18/2007 0203      Component Value Date/Time   CALCIUM 9.5 02/08/2014 1030   CALCIUM 8.9 03/18/2007 0203   ALKPHOS 58 02/08/2014 1030   AST 37* 02/08/2014 1030   ALT 33 02/08/2014 1030   BILITOT 1.35* 02/08/2014 1030      Results for Daniel Rivers (MRN [id number]) as of 02/09/2014 15:13  Ref. Range 08/16/2013 09:25 11/08/2013 08:53 02/08/2014 10:30  Ferritin Latest Range: 22.0-322.0 ng/mL 63 77 69    Impression and Plan:  64 year old gentleman with the following issues:  1. Hemachromatosis: he is currently on phlebotomy to keep his ferritin less than 50. His last phlebotomy 3 months ago with ferritin level declining slowly. The plan is to continue with phlebotomy every 3 months to keep his ferritin close to 50. I will recheck his ferritin level in 3 months and proceed with phlebotomy if needed to.  2. Cirrhosis of the liver: He follows up with gastroenterology regarding that. He appears to be stable  3. Thrombocytopenia: This is likely related to his cirrhosis of the liver and his counts are stable recently.   [facility], MD 1/8/20163:08 PM

## 2014-02-09 NOTE — Progress Notes (Signed)
Therapeutic phlebotomy performed per order from right [facility] over 8 minutes without difficulty. Pt tolerated procedure well. [place] sandwhich and drink provided. Will continue to monitor patient.

## 2014-02-09 NOTE — Telephone Encounter (Signed)
gv adn printed appt sched and avs for pt for April  °

## 2014-02-12 NOTE — Progress Notes (Signed)
Urine drug screen for this encounter is consistent for prescribed medications.   

## 2014-02-21 ENCOUNTER — Other Ambulatory Visit: Payer: Self-pay | Admitting: Physical Medicine & Rehabilitation

## 2014-05-04 ENCOUNTER — Encounter: Payer: Medicare Other | Admitting: Registered Nurse

## 2014-05-04 ENCOUNTER — Ambulatory Visit: Payer: Medicare Other | Admitting: Physical Medicine & Rehabilitation

## 2014-05-04 ENCOUNTER — Ambulatory Visit: Payer: Self-pay | Admitting: Physical Medicine & Rehabilitation

## 2014-05-08 ENCOUNTER — Encounter: Payer: Self-pay | Admitting: Registered Nurse

## 2014-05-08 ENCOUNTER — Encounter: Payer: Medicare Other | Attending: Physical Medicine & Rehabilitation | Admitting: Registered Nurse

## 2014-05-08 VITALS — BP 125/62 | HR 90 | Resp 14

## 2014-05-08 DIAGNOSIS — M7061 Trochanteric bursitis, right hip: Secondary | ICD-10-CM

## 2014-05-08 DIAGNOSIS — M7062 Trochanteric bursitis, left hip: Secondary | ICD-10-CM | POA: Diagnosis not present

## 2014-05-08 DIAGNOSIS — Z79899 Other long term (current) drug therapy: Secondary | ICD-10-CM

## 2014-05-08 DIAGNOSIS — G894 Chronic pain syndrome: Secondary | ICD-10-CM | POA: Diagnosis not present

## 2014-05-08 DIAGNOSIS — Z5181 Encounter for therapeutic drug level monitoring: Secondary | ICD-10-CM | POA: Diagnosis not present

## 2014-05-08 DIAGNOSIS — M459 Ankylosing spondylitis of unspecified sites in spine: Secondary | ICD-10-CM | POA: Insufficient documentation

## 2014-05-08 MED ORDER — HYDROCODONE-ACETAMINOPHEN 7.5-325 MG PO TABS: 1.0000 | ORAL_TABLET | Freq: Four times a day (QID) | ORAL | Status: DC | PRN

## 2014-05-08 MED ORDER — MORPHINE SULFATE ER 60 MG PO TBCR: 60.0000 mg | EXTENDED_RELEASE_TABLET | Freq: Three times a day (TID) | ORAL | Status: DC

## 2014-05-08 NOTE — Progress Notes (Signed)
Subjective:    Patient ID: Daniel Rivers, male    DOB: [birth year], 64 y.o.   MRN: [id number]  HPI: Mr. KOLSEN CHOE is a 64 year old male who returns for chronic pain and medication refill. He says his pain is located in his neck and bilateral hip's. He rates his pain 5. His current exercise regime is performing stretching exercises and walking in his home. 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 He's schedule for left heart catheterization with coronary angiogram on 05/22/14 at [facility].   Pain Inventory Average Pain 6 Pain Right Now 5 My pain is sharp, burning, dull, stabbing, tingling and aching  In the last 24 hours, has pain interfered with the following? General activity 4 Relation with others 2 Enjoyment of life 5 What TIME of day is your pain at its worst? evening Sleep (in general) Fair  Pain is worse with: walking, bending, sitting, inactivity, standing and some activites Pain improves with: rest, heat/ice, therapy/exercise, pacing activities and medication Relief from Meds: 5  Mobility Do you have any goals in this area?  no  Function Do you have any goals in this area?  no  Neuro/Psych weakness numbness tingling  Prior Studies Any changes since last visit?  no  Physicians involved in your care Any changes since last visit?  no   History reviewed. No pertinent family history. History   Social History  . Marital Status: Married    Spouse Name: N/A  . Number of Children: N/A  . Years of Education: N/A   Social History Main Topics  . Smoking status: Never Smoker   . Smokeless tobacco: Not on file  . Alcohol Use: Not on file  . Drug Use: Not on file  . Sexual Activity: Not on file   Other Topics Concern  . None   Social History Narrative   Past Surgical History  Procedure Laterality Date  . Cortisone injection    . Small intestine surgery      . Spine surgery    . Neuroplasty / transposition median nerve at carpal tunnel bilateral    . Elbow surgery    . Rectal fissure repair    . Rotator cuff repair    . Stint placement     Past Medical History  Diagnosis Date  . Ankylosing spondylitis of lumbar region   . Ankylosing spondylitis of cervical region   . Obesity   . Bursitis, trochanteric   . Rotator cuff syndrome   . Tendonitis   . Depression   . Anxiety   . Back pain   . Hypertension   . Diabetes mellitus    BP 125/62 mmHg  Pulse 90  Resp 14  SpO2 98%  Opioid Risk Score:   Fall Risk Score: Moderate Fall Risk (6-13 points) (previously educated and given handout)`1  Depression screen PHQ 2/9  Depression screen [facility] 2/9 05/08/2014 02/09/2014 02/09/2014 11/08/2013  Decreased Interest 2 0 0 0  Down, Depressed, Hopeless 0 0 0 0  PHQ - 2 Score 2 0 0 0  Altered sleeping 0 - - -  Tired, decreased energy 3 - - -  Change in appetite 0 - - -  Feeling bad or failure about yourself  1 - - -  Trouble concentrating 1 - - -  Moving slowly or fidgety/restless 0 - - -  Suicidal thoughts 0 - - -  PHQ-9 Score 7 - - -     Review of Systems  Respiratory: Positive for shortness of breath.   Cardiovascular: Positive for leg swelling.  Neurological: Positive for weakness and numbness.       Tingling  All other systems reviewed and are negative.      Objective:   Physical Exam  Constitutional: He is oriented to person, place, and time. He appears well-developed and well-nourished.  HENT:  Head: Normocephalic and atraumatic.  Neck: Normal range of motion. Neck supple.  Cervical Paraspinal Tenderness: C-5- C-6  Cardiovascular: Normal rate and regular rhythm.   Pulmonary/Chest: Effort normal and breath sounds normal.  Musculoskeletal:  Normal Muscle Bulk and Muscle Testing Reveals: Upper Extremities: Full ROM and Muscle Strength 5/5 Bilateral Greater Trochanteric Tenderness Lower Extremities: Full ROM and Muscle Strength  5/5 Arises from chair with ease Narrow Based gait   Neurological: He is alert and oriented to person, place, and time.  Skin: Skin is warm and dry.  Psychiatric: He has a normal mood and affect.  Nursing note and vitals reviewed.         Assessment & Plan:  1. 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 Total of three script's given. 2. Right greater trochanter bursitis: Continue With Mobic and exercise and current medication regime.  20 minutes of face to face patient care time was spent during this visit. All questions were encouraged and answered  F/U in 3 month

## 2014-05-16 ENCOUNTER — Other Ambulatory Visit (HOSPITAL_BASED_OUTPATIENT_CLINIC_OR_DEPARTMENT_OTHER): Payer: Medicare Other

## 2014-05-16 DIAGNOSIS — D696 Thrombocytopenia, unspecified: Secondary | ICD-10-CM

## 2014-05-16 DIAGNOSIS — Z862 Personal history of diseases of the blood and blood-forming organs and certain disorders involving the immune mechanism: Secondary | ICD-10-CM

## 2014-05-16 DIAGNOSIS — K7460 Unspecified cirrhosis of liver: Secondary | ICD-10-CM | POA: Diagnosis not present

## 2014-05-16 LAB — COMPREHENSIVE METABOLIC PANEL (CC13)
ALT: 31 U/L (ref 0–55)
AST: 34 U/L (ref 5–34)
Albumin: 3.9 g/dL (ref 3.5–5.0)
Alkaline Phosphatase: 57 U/L (ref 40–150)
Anion Gap: 12 mEq/L — ABNORMAL HIGH (ref 3–11)
BUN: 13.9 mg/dL (ref 7.0–26.0)
CHLORIDE: 101 meq/L (ref 98–109)
CO2: 28 meq/L (ref 22–29)
Calcium: 9.3 mg/dL (ref 8.4–10.4)
Creatinine: 0.9 mg/dL (ref 0.7–1.3)
EGFR: 85 mL/min/{1.73_m2} — ABNORMAL LOW (ref >90)
Glucose: 118 mg/dl (ref 70–140)
Potassium: 4.1 mEq/L (ref 3.5–5.1)
SODIUM: 140 meq/L (ref 136–145)
Total Bilirubin: 1.18 mg/dL (ref 0.20–1.20)
Total Protein: 7.1 g/dL (ref 6.4–8.3)

## 2014-05-16 LAB — CBC WITH DIFFERENTIAL/PLATELET
BASO%: 0.7 % (ref 0.0–2.0)
Basophils Absolute: 0 10*3/uL (ref 0.0–0.1)
EOS ABS: 0.1 10*3/uL (ref 0.0–0.5)
EOS%: 1.9 % (ref 0.0–7.0)
HCT: 40.6 % (ref 38.4–49.9)
HGB: 13.1 g/dL (ref 13.0–17.1)
LYMPH%: 33.8 % (ref 14.0–49.0)
MCH: 28.4 pg (ref 27.2–33.4)
MCHC: 32.4 g/dL (ref 32.0–36.0)
MCV: 87.8 fL (ref 79.3–98.0)
MONO#: 0.5 10*3/uL (ref 0.1–0.9)
MONO%: 10.9 % (ref 0.0–14.0)
NEUT%: 52.7 % (ref 39.0–75.0)
NEUTROS ABS: 2.4 10*3/uL (ref 1.5–6.5)
PLATELETS: 126 10*3/uL — AB (ref 140–400)
RBC: 4.63 10*6/uL (ref 4.20–5.82)
RDW: 14.1 % (ref 11.0–14.6)
WBC: 4.5 10*3/uL (ref 4.0–10.3)
lymph#: 1.5 10*3/uL (ref 0.9–3.3)

## 2014-05-16 LAB — FERRITIN CHCC: FERRITIN: 54 ng/mL (ref 22–316)

## 2014-05-18 ENCOUNTER — Ambulatory Visit (HOSPITAL_BASED_OUTPATIENT_CLINIC_OR_DEPARTMENT_OTHER): Payer: Medicare Other | Admitting: Oncology

## 2014-05-18 ENCOUNTER — Telehealth: Payer: Self-pay | Admitting: Oncology

## 2014-05-18 VITALS — BP 130/60 | HR 80 | Temp 97.8°F | Resp 18 | Ht 75.0 in | Wt 255.5 lb

## 2014-05-18 DIAGNOSIS — D696 Thrombocytopenia, unspecified: Secondary | ICD-10-CM

## 2014-05-18 DIAGNOSIS — K7460 Unspecified cirrhosis of liver: Secondary | ICD-10-CM

## 2014-05-18 DIAGNOSIS — Z862 Personal history of diseases of the blood and blood-forming organs and certain disorders involving the immune mechanism: Secondary | ICD-10-CM

## 2014-05-18 NOTE — Progress Notes (Signed)
Hematology and Oncology Follow Up Visit  Daniel Rivers [id number] Nov 22, 1950 64 y.o. 05/18/2014 1:54 PM [name], MDPang, Richard, MD   Principle Diagnosis: 64 year old with hemochromatosis and liver cirrhosis diagnosed in 2009. He established care at the [facility] in August of 2014.   Prior Therapy: He have been receiving intermittent phlebotomies in the past.  Current therapy: He have been receiving an every 3 month phlebotomy to achieve ferritin level close to 50.  Interim History:  [name] presents today for a followup visit. Since his last visit, he reports symptoms of exertional dyspnea. He is scheduled to have a cardiac catheterization and possibly stent placement. He tolerated phlebotomies previously without any complications. He is not reported any fatigue or tiredness.. He had not reported any recent illnesses or hospitalizations. His energy and performance status is about the same. He has not reported any chest pain or palpitations. He has not reported any headaches or blurry vision or alteration of his mental status. Has not had any genitourinary complaints of frequency urgency or hesitancy. Has not reported any lymphadenopathy or petechiae. Has not reported any easy bruisability or any bleeding. The rest of his review of symptoms is unremarkable.   Medications: I have reviewed the patient's current medications.  Current Outpatient Prescriptions  Medication Sig Dispense Refill  . ACCU-CHEK FASTCLIX LANCETS MISC     . aspirin 81 MG tablet Take 81 mg by mouth daily.     . bisacodyl (DULCOLAX) 5 MG EC tablet Take 5 mg by mouth at bedtime. 2 at bedtime    . buPROPion (WELLBUTRIN XL) 300 MG 24 hr tablet Take 300 mg by mouth daily.     . carvedilol (COREG) 3.125 MG tablet Take 3.125 mg by mouth 2 (two) times daily.    . Cholecalciferol (VITAMIN D3) 2000 UNITS TABS Take 1 tablet by mouth daily.    . furosemide (LASIX) 20 MG tablet Take 20 mg by mouth as needed  for fluid.     [name] glimepiride (AMARYL) 2 MG tablet Take 1 mg by mouth daily before breakfast. Takes 1/2 every morning    . HYDROcodone-acetaminophen (NORCO) 7.5-325 MG per tablet Take 1 tablet by mouth every 6 (six) hours as needed. 1 month supply (Patient taking differently: Take 1 tablet by mouth every 6 (six) hours as needed (pain). 1 month supply) 120 tablet 0  . isosorbide mononitrate (IMDUR) 60 MG 24 hr tablet Take 1 tablet by mouth daily.  1  . KRILL OIL PO Take 1 tablet by mouth daily.     [name] losartan (COZAAR) 100 MG tablet Take 100 mg by mouth daily.    . Magnesium Oxide 400 MG CAPS Take 1 capsule by mouth daily.    . meloxicam (MOBIC) 7.5 MG tablet TAKE 1 BY MOUTH DAILY (Patient taking differently: TAKE 1 BY MOUTH DAILY AS NEEDED FOR PAIN) 90 tablet 3  . metFORMIN (GLUCOPHAGE) 1000 MG tablet Take 1,000 mg by mouth every 12 (twelve) hours.    [name] morphine (MS CONTIN) 60 MG 12 hr tablet Take 1 tablet (60 mg total) by mouth 3 (three) times daily. 90 tablet 0  . Multiple Vitamin (MULTIVITAMIN) capsule Take 1 capsule by mouth daily. NO IRON    . NITROSTAT 0.4 MG SL tablet Place 0.4 mg under the tongue every 5 (five) minutes as needed for chest pain.     . polyethylene glycol powder (GLYCOLAX/MIRALAX) powder Using measuring cap mix  17gm in water and drink two times daily 3162  g 11  . pravastatin (PRAVACHOL) 40 MG tablet Take 40 mg by mouth daily.     . Psyllium (METAMUCIL PO) Take 17 g/day by mouth 2 (two) times daily.    [name] zolpidem (AMBIEN) 10 MG tablet Take 10 mg by mouth at bedtime as needed for sleep.      No current facility-administered medications for this visit.     Allergies:  Allergies  Allergen Reactions  . Amlodipine Besy-Benazepril Hcl Cough  . Naproxen Other (See Comments)    Worsens Tinnitus    Past Medical History, Surgical history, Social history, and Family History were reviewed and updated.  [name] Physical Exam: Blood pressure 130/60, pulse 80, temperature 97.8 F  (36.6 C), temperature source Oral, resp. rate 18, height 6\' 3"  (1.905 m), weight 255 lb 8 oz (115.894 kg), SpO2 99 %. ECOG: 1 General appearance: alert and cooperative. Not in any distress.  Head: Normocephalic, without obvious abnormality Neck: no adenopathy Lymph nodes: Cervical, supraclavicular, and axillary nodes normal. Heart:regular rate and rhythm, S1, S2 normal, no murmur, click, rub or gallop Lung:chest clear, no wheezing, rales, normal symmetric air entry. Abdomin: soft, non-tender, without masses or organomegaly EXT:no erythema, induration, or nodules   Lab Results: Lab Results  Component Value Date   WBC 4.5 05/16/2014   HGB 13.1 05/16/2014   HCT 40.6 05/16/2014   MCV 87.8 05/16/2014   PLT 126* 05/16/2014     Chemistry      Component Value Date/Time   NA 140 05/16/2014 1316   NA 138 04/20/2007 0825   K 4.1 05/16/2014 1316   K 3.9 04/20/2007 0825   CL 101 03/18/2007 0203   CO2 28 05/16/2014 1316   CO2 29 03/18/2007 0203   BUN 13.9 05/16/2014 1316   BUN 10 03/18/2007 0203   CREATININE 0.9 05/16/2014 1316   CREATININE 0.77 03/18/2007 0203      Component Value Date/Time   CALCIUM 9.3 05/16/2014 1316   CALCIUM 8.9 03/18/2007 0203   ALKPHOS 57 05/16/2014 1316   AST 34 05/16/2014 1316   ALT 31 05/16/2014 1316   BILITOT 1.18 05/16/2014 1316       Results for CHANZE, TEAGLE (MRN [id number]) as of 05/18/2014 13:46  Ref. Range 11/08/2013 08:53 02/08/2014 10:30 05/16/2014 13:16  Ferritin Latest Ref Range: 22-316 ng/ml 77 69 54    Impression and Plan:  64 year old gentleman with the following issues:  1. Hemachromatosis: he is currently on phlebotomy to keep his ferritin close to 50. His ferritin on 05/16/2014 was discussed today and was 54. Given the anticipated cardiac catheterization next week, I have held off on phlebotomy today. He is close to his target and I see no reason to do phlebotomy at this time. I will evaluate him in 3 months time and have a  phlebotomy if his ferritin comes back beyond the 50 range.  2. Cirrhosis of the liver: He follows up with gastroenterology regarding that. He appears to be stable  3. Thrombocytopenia: This is likely related to his cirrhosis of the liver and his counts are stable recently.   [facility], MD 4/15/20161:54 PM

## 2014-05-18 NOTE — Telephone Encounter (Signed)
gave and printed appt sched adn avs for pt for July sed added tx.

## 2014-05-19 NOTE — H&P (Signed)
Office visit notes copied to [employer]. [name] 04/18/2014 8:55 AM Location: [facility] Patient #: [id number] DOB: Nov 28, 1950 Widowed / Language: Daniel Rivers / Race: White Male    History of Present Illness(Daniel Carlynn Herald, MD; 04/18/2014 10:17 PM) Patient words: f/u cp, abn nuc; Pt c/o sobr. Pt denies dizziness.  The patient is a 64 year old male who presents for a follow-up for CAD. [name] is a very pleasant 64 year old Caucasian male with history of ankylosing spondylitis, degenerative joint disease and history of known coronary artery disease and angioplasty in 2009 presents here for followup.   I had seen him about a month ago, was the past 2-3 months, he has been having exertional chest pain. A month ago he was able to walk at least 2 miles, however now he states that even walking minimal distances he has to stop and rest due to severe chest pain and shortness of breath. He underwent stress testing and echocardiogram and presents here for follow-up. He suspect that he has significant progression of coronary artery disease. He is extremely worried about this chest discomfort which he states is worsening even since his last OV. He has not used any s/l NTG but states he has come close to several times. He has not had any PND or orthopnea. He denies any palpitations, TIA, claudication.     Problem List/Past Medical(Daniel Rivers; 04/18/2014 3:29 PM) History of hemochromatosis (Z86.39) Controlled diabetes mellitus type II without complication (I43.3). Labs 03/26/2014: CBC normal, serum glucose 117, creatinine 0.95, CMP otherwise normal, HbA1c 6.6%, total cholesterol 123, HDL 35, LDL 58, triglycerides 152, TSH 1.510 Postsurgical percutaneous transluminal coronary angioplasty status (Z98.61) Ankylosing spondylitis of multiple sites in spine (M45.0). 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 Labs 03/26/2014: CBC normal, serum glucose 117, creatinine 0.95, CMP normal, HbA1c 6.6%, total cholesterol 123, HDL 35, LDL 58, triglycerides 152, TSH 1.510 CAD S/P percutaneous coronary angioplasty (I25.10). CAD/ASHD:. S/P PTCA and stenting in 2/09 Prox and Mid RCA 3.0x30 and 3.5x12 Driver non-DES; LAD mid 2.75x18 Driver non drug eluting stent.  Exercise myoview stress 04/13/2014: 1. The resting electrocardiogram demonstrated normal sinus rhythm, normal resting conduction, no resting arrhythmias and normal rest repolarization. The stress electrocardiogram was normal. The patient performed treadmill exercise using a Bruce protocol, completing 4:30 minutes. The patient completed an estimated workload of 6.33 METS, 101% of the maximum predicted heart rate. The stress test was terminated because of fatigue. 2. This is an abnormal myocardial perfusion imaging study demonstrating a mixture of scar plus ischemia in the basal inferior, mid inferior, mid inferolateral, apical inferior and apical lateral myocardial wall(s). The left ventricular ejection fraction was calculated or visually estimated to be 66%. Compared to the prior study from 12/28/2011, the current study reveals new ischemic changes. This represents a high risk scan, consider further cardiac workup.  Echo 05/10/08: echocardiogram within normal limits.    Allergies(Daniel Rivers; 04/18/2014 3:29 PM) Naproxen *ANALGESICS - ANTI-INFLAMMATORY*. Rash. Amlodipine Besy-Benazepril HCl *ANTIHYPERTENSIVES*. Rash.    Family History(Daniel Rivers; 04/18/2014 3:29 PM) married, 1 child, mother is diabetic,has chf,;father died age 43 from a cva; 3 sister's one is a diabetic; one has fibromyalgia; one has hypertension, hemochromotosis    Social History(Daniel Rivers; 04/18/2014 3:29 PM) prior smoker, no alcohol    Medication History(Daniel  Rivers; 04/18/2014 3:38 PM) Nitrostat (0.4MG  Tab Sublingual, 1 (one) Tab Sublingual  Sublingual Keep under tongue for chest pain every 5 minutes for chest pain as needed, Taken starting 11/16/2011) Active. Cozaar (100MG  Tablet, 1 Oral daily) Active. Hydrocodone-Acetaminophen (7.5-325MG  Tablet, 1 Oral every 6 hours, as needed) Active. Polyethylene Glycol 3350 (17 gm Oral two times daily) Active. Dulcolax (5MG  Tablet DR, 2 Oral at bedtime) Active. Magnesium Oxide (400MG  Tablet, 1 Oral daily) Active. Vitamin D3 (2000UNIT Capsule, 1 Oral daily) Active. Multivitamins (1 Oral daily) Active. Simvastatin (40MG  Tablet, 1/4 Oral at bedtime, Stopped taking 04/18/2014) Discontinued. (Switched to Pravastatin) Coreg (3.125MG  Tablet, 1 Oral two times daily) Active. Morphine Sulfate ER (60MG  Tablet ER 12HR, 1 Oral three times daily) Active. Ambien (10MG  Tablet, 1 Oral at bedtime prn) Active. Aspirin (81MG  Tablet, 1 Oral daily) Active. Omega-3 (1000MG  Capsule, 1 Oral daily) Active. Metamucil (17 gm two times daily) Active. MetFORMIN HCl (1000MG  Tablet, 1 Oral every 12 hours) Active. Vitamin B12 (1 Oral daily) Specific dose unknown - Active. Allegra Allergy (180MG  Tablet, 1 Oral daily prn) Active. BuPROPion HCl ER (XL) (300MG  Tablet ER 24HR, 1 Oral daily) Active. Furosemide (20MG  Tablet, 1 Oral as needed) Active. Amaryl (2MG  Tablet, 1/2 Oral daily) Active. Mobic (7.5MG  Tablet, 1 Oral as needed) Active.    Diagnostic Studies History(Daniel Rivers; 04/18/2014 3:29 PM) Echocardiogram. 11/23/2011 Echo 11/23/11 1. Left ventricular internal dimension is decreased. Asymmetric septal wall hypertrophy. Normal global wall motion. Normal systolic global function. Calculated EF 56%. 2. Mild calcification of the mitral annulus. Nuclear stress test. 12/28/2011 Mild diaphragmatic attenuation without ischemia. Low risk stress.  1. Resting EKG NSR. Stress EKG was non diagnostic for ischemia. No ST-T changes of  ischemia noted with pharmacologic stress testing. Stress symptoms included SHORTNESS OF BREATH AND LIGHT HEADEDNESS. Stress terminated due to completion of protocol. 2. The perfusion study demonstrated mild diaphragmatic attenuation artifact in the inferior wall. There was no evidence of ischemia or scar. Dynamic gated images reveal normal wall motion and endocardial thickening. Left ventricular ejection fraction was estimated to be 85%. 3. This represents a low risk study. Echocardiogram. 05/01/2013 Study is technically limited due to pt's body habitus. 1. Left ventricular cavity is normal in size. Moderate concentric hypertrophy. Septum appears more hypertrophied than post. wall. Diastolic filling with impaired relaxation pattern. Normal global wall motion. Normal systolic global function. Calculated EF 57%. Doppler evidence of Grade I (impaired) diastolic dysfunction. 2. Normal trileaflet aortic valve, with normal systolic flow pattern, with no regurgitation noted. 3. Mild calcification of the mitral annulus. Mitral valve inflow A > E ratio. 4. Tricuspid valve structurally normal. Trace tricuspid regurgitation. 5. The aortic root is mildly dilated. Mildly dilated ascending aorta. Nuclear Stress Test. 04/13/2014 1. The resting electrocardiogram demonstrated normal sinus rhythm, normal resting conduction, no resting arrhythmias and normal rest repolarization. The stress electrocardiogram was normal. The patient performed treadmill exercise using a Bruce protocol, completing 4:30 minutes. The patient completed an estimated workload of 6.33 METS, 101% of the maximum predicted heart rate. The stress test was terminated because of fatigue. 2. This is an abnormal myocardial perfusion imaging study demonstrating a mixture of scar plus ischemia in the basal inferior, mid inferior, mid inferolateral, apical inferior and apical lateral myocardial wall(s). The left ventricular ejection  fraction was calculated or visually estimated to be 66%. Compared to the prior study from 12/28/2011, the current study reveals new ischemic changes. This represents a high risk scan, consider further cardiac workup. CAD/ASHD:. S/P PTCA and stenting in 2/09 Prox and Mid RCA 3.0x30 and 3.5x12 Driver non-DES; LAD mid 2.75x18  Driver. heart cath 2/09 Prox and Mid RCA 3.0x30 and 3.5x12 Driver non-DES; LAD mid 2.75x18 Driver. ECG 10/16/10: NSR, borderline low voltage otherwise normal. Nuclear stress on 10/18/09 No ischemia normal LV systolic function, low risk study. Done for dyspnea. No change compared to 05/10/08. Echo 05/10/08: echocardiogram within normal limits.    Review of Systems(Daniel Carlynn Herald, MD; 04/18/2014 10:17 PM) General:Present- Feeling well and Obesity. Not Present- Anorexia, Appetite Loss, Fatigue and Tiredness. Respiratory:Present- Difficulty Breathing on Exertion. Not Present- Hemoptysis and Wakes up from Sleep Wheezing or Short of Breath. Cardiovascular:Present- Chest Pain. Not Present- Calf Cramps, Difficulty Breathing Lying Down and Fainting / Blacking Out. Musculoskeletal:Present- Backache, Joint Pain, Joint Stiffness and Leg Weakness. Neurological:Not Present- Decreased Memory and Difficulty Speaking. Endocrine:Not Present- Excessive Thirst, Polydipsia and Polyuria. All other systems negative    Vitals(Daniel Rivers; 04/18/2014 3:42 PM) 04/18/2014 3:29 PM Weight: 255.56 lb Height: 75 in Body Surface Area: 2.48 m Body Mass Index: 31.94 kg/m Pulse: 89 (Regular) P.OX: 96% (Room air) BP: 116/78 (Sitting, Left Arm, Standard)     Physical Exam(Daniel Carlynn Herald, MD; 04/18/2014 10:17 PM) The physical exam findings are as follows:   General Mental Status- Alert. General Appearance- Cooperative and Well groomed. Not in acute distress. Orientation- Oriented to time, Oriented to place, Oriented to purpose and Oriented to person. Build &  Nutrition- Well developed and Obese. Hydration- Well hydrated.   Integumentary General Characteristics:Overall examination of the patient's skin reveals - no suspicious lesions, no bruises and no evidence of scars. Color- normal coloration of skin. Skin Moisture- normal skin moisture.   Head and Neck Head- normocephalic, atraumatic with no lesions or palpable masses. Neck Global Assessment- full range of motion and supple. no bruit auscultated on the right, no bruit auscultated on the left, non-tender, no lymphadenopathy, no palpable mass on the right, no palpable mass on the left and no nucchal rigidty. Thyroid Gland Characteristics- normal size and consistency and no palpable nodules. non-tender.   Chest and Lung Exam Chest and lung exam reveals - normal excursion with symmetric chest walls, quiet, even and easy respiratory effort with no use of accessory muscles and on auscultation, normal breath sounds, no adventitious sounds and normal vocal resonance.   Cardiovascular Inspection:Jugular vein- Bilateral- Inspection Normal. Palpation/Percussion:Carotid Artery- Bilateral- Normal pulsations. Auscultation:Heart Sounds- Normal heart sounds.   Abdomen Inspection:Inspection of the abdomen reveals - No Visible peristalsis, No Abnormal pulsations, No Paradoxical movements and No Hernias. Skin:Inspection of the skin of the abdomen reveals - No Stria and No Scars.   Peripheral Vascular Lower Extremity:Inspection- Left- Pink skin. No Cyanotic nailbeds or Ulcerations. Right- Pink skin. No Cyanotic nailbeds or Ulcerations. Palpation:Temperature- Left- Warm. Right- Warm. Tenderness- Left- Non Tender. Right- Non Tender. Edema- Left- No edema. Right- No edema.   Neurologic Mental Status:Affect- appropriate. Speech- Normal. Cranial Nerves:- Normal Bilaterally. Sensory:- Normal. Motor: Bulk and Contour:- Normal. Tone:-  Normal. Strength:5/5 normal muscle strength- All Muscles. Coordination- Normal.   Musculoskeletal- Did not examine.    Assessment & Plan(Daniel Carlynn Herald, MD; 04/18/2014 10:18 PM) CAD S/P percutaneous coronary angioplasty (I25.10) Story: CAD/ASHD:. S/P PTCA and stenting in 2/09 Prox and Mid RCA 3.0x30 and 3.5x12 Driver non-DES; LAD mid 2.75x18 Driver non drug eluting stent.  Exercise myoview stress 04/13/2014: 1. The resting electrocardiogram demonstrated normal sinus rhythm, normal resting conduction, no resting arrhythmias and normal rest repolarization. The stress electrocardiogram was normal. The patient performed treadmill exercise using a Bruce protocol, completing 4:30 minutes. The patient completed an estimated workload of 6.33  METS, 101% of the maximum predicted heart rate. The stress test was terminated because of fatigue. 2. This is an abnormal myocardial perfusion imaging study demonstrating a mixture of scar plus ischemia in the basal inferior, mid inferior, mid inferolateral, apical inferior and apical lateral myocardial wall(s). The left ventricular ejection fraction was calculated or visually estimated to be 66%. Compared to the prior study from 12/28/2011, the current study reveals new ischemic changes. This represents a high risk scan, consider further cardiac workup.  Echo 05/10/08: echocardiogram within normal limits. Impression: EKG 04/18/2014: Normal sinus rhythm at the rate of 88 bpm, normal EKG. No evidence of ischemia. No chanage from EKG 11/16/2011. Current Plans l Started Isosorbide Mononitrate ER 60MG , 1 (one) Tablet ER 24HR daily, #30, 04/18/2014, Ref. x2. Future Plans l 05/14/2014: CBC & PLATELETS (AUTO) (62947) - one time  Shortness of breath at rest (R06.02) Story: Echo- 05/01/13 Study is technically limited due to pt's body habitus. 1. Left ventricular cavity is normal in size. Moderate concentric hypertrophy. Septum appears more hypertrophied  than post. wall. Diastolic filling with impaired relaxation pattern. Normal global wall motion. Normal systolic global function. Calculated EF 57%. Doppler evidence of Grade I (impaired) diastolic dysfunction. 2. Normal trileaflet aortic valve, with normal systolic flow pattern, with no regurgitation noted. 3. Mild calcification of the mitral annulus. Mitral valve inflow A > E ratio. 4. Tricuspid valve structurally normal. Trace tricuspid regurgitation. 5. The aortic root is mildly dilated. Mildly dilated ascending aorta.  Essential hypertension, benign (I10) Story: Labs 03/26/2014: CBC normal, serum glucose 117, creatinine 0.95, CMP normal, HbA1c 6.6%, total cholesterol 123, HDL 35, LDL 58, triglycerides 152, TSH 1.510 Future Plans l 6/54/6503: METABOLIC PANEL, BASIC (54656) - one time  Encounter for preprocedural cardiovascular examination (Z01.810) Story: Scheduled for coronary angiogram Future Plans l 05/14/2014: PT (PROTHROMBIN TIME) (81275) - one time  Controlled diabetes mellitus type II without complication (T70.0) Story: Labs 03/26/2014: CBC normal, serum glucose 117, creatinine 0.95, CMP otherwise normal, HbA1c 6.6%, total cholesterol 123, HDL 35, LDL 58, triglycerides 152, TSH 1.510 Current Plans l Mechanism of underlying disease process and action of medications discussed with the patient. I discussed primary/secondary prevention and also dietary counceling was done. Patient is here for follow-up of chest pain suggestive of angina pectoris. Patient continues to have significant symptoms angina, doing even minimal activities, walking from my office to the parking lot which is only short distance away he had to sit down and rest. It is associated marked dyspnea. Due to abnormal stress test, I suspect either progression of coronary artery disease or restenosis. I'll set him up for coronary angiography and see him back after this. I have started him on isosorbide mononitrate  60 mg by mouth daily. Schedule for cardiac catheterization, and possible angioplasty. We discussed regarding risks, benefits, alternatives to this including stress testing, CTA and continued medical therapy. Patient wants to proceed. Understands <1-2% risk of death, stroke, MI, urgent CABG, bleeding, infection, renal failure but not limited to these. Video recording of the procedure shown to the patient. 04/18/2014   l Referred to [name] MD.

## 2014-05-22 ENCOUNTER — Encounter (HOSPITAL_COMMUNITY)
Admission: RE | Disposition: A | Discharge status: Home / Self Care | Payer: Medicare Other | Source: Ambulatory Visit | Attending: Cardiology

## 2014-05-22 ENCOUNTER — Encounter (HOSPITAL_COMMUNITY): Payer: Self-pay | Admitting: General Practice

## 2014-05-22 ENCOUNTER — Other Ambulatory Visit: Payer: Medicare Other

## 2014-05-22 ENCOUNTER — Ambulatory Visit (HOSPITAL_COMMUNITY)
Admission: RE | Admit: 2014-05-22 | Discharge: 2014-05-23 | Disposition: A | Discharge status: Home / Self Care | Payer: Medicare Other | Source: Ambulatory Visit | Attending: Cardiology | Admitting: Cardiology

## 2014-05-22 DIAGNOSIS — I2510 Atherosclerotic heart disease of native coronary artery without angina pectoris: Secondary | ICD-10-CM | POA: Diagnosis not present

## 2014-05-22 DIAGNOSIS — T82858A Stenosis of vascular prosthetic devices, implants and grafts, initial encounter: Secondary | ICD-10-CM | POA: Insufficient documentation

## 2014-05-22 DIAGNOSIS — R0602 Shortness of breath: Secondary | ICD-10-CM | POA: Diagnosis present

## 2014-05-22 DIAGNOSIS — Z9861 Coronary angioplasty status: Secondary | ICD-10-CM

## 2014-05-22 DIAGNOSIS — Z888 Allergy status to other drugs, medicaments and biological substances status: Secondary | ICD-10-CM | POA: Diagnosis not present

## 2014-05-22 DIAGNOSIS — I10 Essential (primary) hypertension: Secondary | ICD-10-CM | POA: Diagnosis not present

## 2014-05-22 DIAGNOSIS — Y832 Surgical operation with anastomosis, bypass or graft as the cause of abnormal reaction of the patient, or of later complication, without mention of misadventure at the time of the procedure: Secondary | ICD-10-CM | POA: Diagnosis not present

## 2014-05-22 DIAGNOSIS — Z886 Allergy status to analgesic agent status: Secondary | ICD-10-CM | POA: Insufficient documentation

## 2014-05-22 DIAGNOSIS — E119 Type 2 diabetes mellitus without complications: Secondary | ICD-10-CM | POA: Insufficient documentation

## 2014-05-22 DIAGNOSIS — E785 Hyperlipidemia, unspecified: Secondary | ICD-10-CM | POA: Insufficient documentation

## 2014-05-22 DIAGNOSIS — Z955 Presence of coronary angioplasty implant and graft: Secondary | ICD-10-CM

## 2014-05-22 DIAGNOSIS — Z87891 Personal history of nicotine dependence: Secondary | ICD-10-CM | POA: Diagnosis not present

## 2014-05-22 DIAGNOSIS — R9439 Abnormal result of other cardiovascular function study: Secondary | ICD-10-CM | POA: Diagnosis not present

## 2014-05-22 DIAGNOSIS — R079 Chest pain, unspecified: Secondary | ICD-10-CM | POA: Diagnosis present

## 2014-05-22 HISTORY — DX: Headache: R51

## 2014-05-22 HISTORY — DX: Dorsalgia, unspecified: M54.9

## 2014-05-22 HISTORY — DX: Pure hypercholesterolemia, unspecified: E78.00

## 2014-05-22 HISTORY — DX: Benign prostatic hyperplasia without lower urinary tract symptoms: N40.0

## 2014-05-22 HISTORY — PX: PERCUTANEOUS CORONARY STENT INTERVENTION (PCI-S): SHX5485

## 2014-05-22 HISTORY — PX: LEFT HEART CATHETERIZATION WITH CORONARY ANGIOGRAM: SHX5451

## 2014-05-22 HISTORY — DX: Headache, unspecified: R51.9

## 2014-05-22 HISTORY — DX: Type 2 diabetes mellitus without complications: E11.9

## 2014-05-22 HISTORY — DX: Hemochromatosis, unspecified: E83.119

## 2014-05-22 HISTORY — DX: Unspecified cirrhosis of liver: K74.60

## 2014-05-22 HISTORY — DX: Cervicalgia: M54.2

## 2014-05-22 HISTORY — DX: Gastro-esophageal reflux disease without esophagitis: K21.9

## 2014-05-22 HISTORY — DX: Personal history of other diseases of the digestive system: Z87.19

## 2014-05-22 HISTORY — DX: Other chronic pain: G89.29

## 2014-05-22 HISTORY — DX: Unspecified osteoarthritis, unspecified site: M19.90

## 2014-05-22 LAB — GLUCOSE, CAPILLARY
GLUCOSE-CAPILLARY: 87 mg/dL (ref 70–99)
Glucose-Capillary: 136 mg/dL — ABNORMAL HIGH (ref 70–99)
Glucose-Capillary: 112 mg/dL — ABNORMAL HIGH (ref 70–99)
Glucose-Capillary: 124 mg/dL — ABNORMAL HIGH (ref 70–99)

## 2014-05-22 LAB — PROTIME-INR
INR: 1.04 (ref 0.00–1.49)
Prothrombin Time: 13.7 seconds (ref 11.6–15.2)

## 2014-05-22 LAB — POCT ACTIVATED CLOTTING TIME
Activated Clotting Time: 208 seconds
Activated Clotting Time: 448 seconds

## 2014-05-22 SURGERY — LEFT HEART CATHETERIZATION WITH CORONARY ANGIOGRAM
Anesthesia: LOCAL

## 2014-05-22 MED ORDER — HYDROCODONE-ACETAMINOPHEN 7.5-325 MG PO TABS
1.0000 | ORAL_TABLET | Freq: Four times a day (QID) | ORAL | Status: DC | PRN
  Filled 2014-05-22: qty 1

## 2014-05-22 MED ORDER — LOSARTAN POTASSIUM 50 MG PO TABS
100.0000 mg | ORAL_TABLET | Freq: Every day | ORAL | Status: DC
  Administered 2014-05-22 – 2014-05-23 (×2): 100 mg via ORAL
  Filled 2014-05-22 (×2): qty 2

## 2014-05-22 MED ORDER — SODIUM CHLORIDE 0.9 % IV SOLN
Freq: Once | INTRAVENOUS | Status: AC
  Administered 2014-05-22: 09:00:00 via INTRAVENOUS

## 2014-05-22 MED ORDER — ASPIRIN 81 MG PO CHEW
CHEWABLE_TABLET | ORAL | Status: AC
  Filled 2014-05-22: qty 3

## 2014-05-22 MED ORDER — SODIUM CHLORIDE 0.9 % IJ SOLN: 3.0000 mL | INTRAMUSCULAR | Status: DC | PRN

## 2014-05-22 MED ORDER — ASPIRIN 81 MG PO CHEW: 81.0000 mg | CHEWABLE_TABLET | ORAL | Status: DC

## 2014-05-22 MED ORDER — SODIUM CHLORIDE 0.9 % IV SOLN: 250.0000 mL | INTRAVENOUS | Status: DC | PRN

## 2014-05-22 MED ORDER — CARVEDILOL 3.125 MG PO TABS
3.1250 mg | ORAL_TABLET | Freq: Two times a day (BID) | ORAL | Status: DC
  Administered 2014-05-22 – 2014-05-23 (×3): 3.125 mg via ORAL
  Filled 2014-05-22 (×3): qty 1

## 2014-05-22 MED ORDER — POLYETHYLENE GLYCOL 3350 17 G PO PACK
17.0000 g | PACK | Freq: Two times a day (BID) | ORAL | Status: DC
  Administered 2014-05-22 – 2014-05-23 (×2): 17 g via ORAL
  Filled 2014-05-22 (×3): qty 1

## 2014-05-22 MED ORDER — SODIUM CHLORIDE 0.9 % IJ SOLN
3.0000 mL | Freq: Two times a day (BID) | INTRAMUSCULAR | Status: DC
Start: 2014-05-22 — End: 2014-05-22

## 2014-05-22 MED ORDER — FAMOTIDINE IN NACL 20-0.9 MG/50ML-% IV SOLN
INTRAVENOUS | Status: AC
  Filled 2014-05-22: qty 50

## 2014-05-22 MED ORDER — NITROGLYCERIN 0.4 MG SL SUBL: 0.4000 mg | SUBLINGUAL_TABLET | SUBLINGUAL | Status: DC | PRN

## 2014-05-22 MED ORDER — MORPHINE SULFATE 4 MG/ML IJ SOLN: 4.0000 mg | INTRAMUSCULAR | Status: DC | PRN

## 2014-05-22 MED ORDER — HYDROMORPHONE HCL 1 MG/ML IJ SOLN
INTRAMUSCULAR | Status: AC
  Filled 2014-05-22: qty 1

## 2014-05-22 MED ORDER — LIDOCAINE HCL (PF) 1 % IJ SOLN
INTRAMUSCULAR | Status: AC
  Filled 2014-05-22: qty 30

## 2014-05-22 MED ORDER — CLOPIDOGREL BISULFATE 300 MG PO TABS
ORAL_TABLET | ORAL | Status: AC
  Filled 2014-05-22: qty 2

## 2014-05-22 MED ORDER — SODIUM CHLORIDE 0.9 % IV SOLN: INTRAVENOUS | Status: DC

## 2014-05-22 MED ORDER — FUROSEMIDE 20 MG PO TABS
20.0000 mg | ORAL_TABLET | ORAL | Status: DC | PRN
  Filled 2014-05-22: qty 1

## 2014-05-22 MED ORDER — HEPARIN (PORCINE) IN NACL 2-0.9 UNIT/ML-% IJ SOLN
INTRAMUSCULAR | Status: AC
  Filled 2014-05-22: qty 1000

## 2014-05-22 MED ORDER — VERAPAMIL HCL 2.5 MG/ML IV SOLN
INTRAVENOUS | Status: AC
  Filled 2014-05-22: qty 2

## 2014-05-22 MED ORDER — CLOPIDOGREL BISULFATE 75 MG PO TABS
75.0000 mg | ORAL_TABLET | Freq: Every day | ORAL | Status: DC
  Administered 2014-05-23: 08:00:00 75 mg via ORAL
  Filled 2014-05-22: qty 1

## 2014-05-22 MED ORDER — INSULIN ASPART 100 UNIT/ML ~~LOC~~ SOLN: 0.0000 [IU] | Freq: Three times a day (TID) | SUBCUTANEOUS | Status: DC

## 2014-05-22 MED ORDER — MIDAZOLAM HCL 2 MG/2ML IJ SOLN
INTRAMUSCULAR | Status: AC
  Filled 2014-05-22: qty 2

## 2014-05-22 MED ORDER — ONDANSETRON HCL 4 MG/2ML IJ SOLN: 4.0000 mg | Freq: Four times a day (QID) | INTRAMUSCULAR | Status: DC | PRN

## 2014-05-22 MED ORDER — BUPROPION HCL ER (XL) 300 MG PO TB24
300.0000 mg | ORAL_TABLET | Freq: Every day | ORAL | Status: DC
Start: 2014-05-23 — End: 2014-05-23
  Administered 2014-05-23: 11:00:00 300 mg via ORAL
  Filled 2014-05-22: qty 1

## 2014-05-22 MED ORDER — ACETAMINOPHEN 325 MG PO TABS: 650.0000 mg | ORAL_TABLET | ORAL | Status: DC | PRN

## 2014-05-22 MED ORDER — SODIUM CHLORIDE 0.9 % IV SOLN: INTRAVENOUS | Status: AC

## 2014-05-22 MED ORDER — ZOLPIDEM TARTRATE 5 MG PO TABS: 10.0000 mg | ORAL_TABLET | Freq: Every evening | ORAL | Status: DC | PRN

## 2014-05-22 MED ORDER — ISOSORBIDE MONONITRATE ER 60 MG PO TB24
60.0000 mg | ORAL_TABLET | Freq: Every day | ORAL | Status: DC
  Administered 2014-05-22 – 2014-05-23 (×2): 60 mg via ORAL
  Filled 2014-05-22 (×2): qty 1

## 2014-05-22 MED ORDER — BIVALIRUDIN 250 MG IV SOLR
INTRAVENOUS | Status: AC
  Filled 2014-05-22: qty 250

## 2014-05-22 MED ORDER — PRAVASTATIN SODIUM 40 MG PO TABS
40.0000 mg | ORAL_TABLET | Freq: Every day | ORAL | Status: DC
  Administered 2014-05-22: 40 mg via ORAL
  Filled 2014-05-22 (×2): qty 1

## 2014-05-22 MED ORDER — MORPHINE SULFATE ER 15 MG PO TBCR
60.0000 mg | EXTENDED_RELEASE_TABLET | Freq: Three times a day (TID) | ORAL | Status: DC
  Administered 2014-05-22 – 2014-05-23 (×3): 60 mg via ORAL
  Filled 2014-05-22 (×3): qty 4

## 2014-05-22 MED ORDER — BISACODYL 5 MG PO TBEC
5.0000 mg | DELAYED_RELEASE_TABLET | Freq: Every day | ORAL | Status: DC
  Administered 2014-05-22: 21:00:00 5 mg via ORAL
  Filled 2014-05-22: qty 1

## 2014-05-22 MED ORDER — ANGIOPLASTY BOOK
Freq: Once | Status: AC
  Administered 2014-05-22: 23:00:00
  Filled 2014-05-22: qty 1

## 2014-05-22 MED ORDER — PSYLLIUM 95 % PO PACK
1.0000 | PACK | Freq: Every day | ORAL | Status: DC
  Administered 2014-05-22 – 2014-05-23 (×2): 1 via ORAL
  Filled 2014-05-22 (×4): qty 1

## 2014-05-22 MED ORDER — GLIMEPIRIDE 1 MG PO TABS
1.0000 mg | ORAL_TABLET | Freq: Every day | ORAL | Status: DC
  Administered 2014-05-23: 06:00:00 1 mg via ORAL
  Filled 2014-05-22 (×2): qty 1

## 2014-05-22 MED ORDER — ASPIRIN 81 MG PO CHEW
81.0000 mg | CHEWABLE_TABLET | Freq: Every day | ORAL | Status: DC
  Administered 2014-05-23: 81 mg via ORAL
  Filled 2014-05-22 (×2): qty 1

## 2014-05-22 MED ORDER — POLYETHYLENE GLYCOL 3350 17 GM/SCOOP PO POWD
1.0000 | Freq: Once | ORAL | Status: DC
  Filled 2014-05-22: qty 255

## 2014-05-22 NOTE — Progress Notes (Signed)
TR BAND REMOVAL  LOCATION:    right radial  DEFLATED PER PROTOCOL:    Yes.    TIME BAND OFF / DRESSING APPLIED:    1700   SITE UPON ARRIVAL:    Level 0  SITE AFTER BAND REMOVAL:    Level 0  REVERSE ALLEN'S TEST:     positive  CIRCULATION SENSATION AND MOVEMENT:    Within Normal Limits   Yes.    COMMENTS:   Tolerated procedure well 

## 2014-05-22 NOTE — Interval H&P Note (Signed)
History and Physical Interval Note:  05/22/2014 11:32 AM  Daniel Rivers  has presented today for surgery, with the diagnosis of abn stress  The various methods of treatment have been discussed with the patient and family. After consideration of risks, benefits and other options for treatment, the patient has consented to  Procedure(s): LEFT HEART CATHETERIZATION WITH CORONARY ANGIOGRAM (N/A) and possible PCI as a surgical intervention .  The patient's history has been reviewed, patient examined, no change in status, stable for surgery.  I have reviewed the patient's chart and labs.  Questions were answered to the patient's satisfaction.    Ischemic Symptoms? CCS III (Marked limitation of ordinary activity) Anti-ischemic Medical Therapy? Maximal Medical Therapy (2 or more classes of medications) Non-invasive Test Results? High-risk stress test findings: cardiac mortality >3%/yr Prior CABG? No Previous CABG   Patient Information:   1-2V CAD, no prox LAD  A (9)  Indication: 19; Score: 9   Patient Information:   CTO of 1 vessel, no other CAD  A (8)  Indication: 29; Score: 8   Patient Information:   1V CAD with prox LAD  A (9)  Indication: 35; Score: 9   Patient Information:   2V-CAD with prox LAD  A (9)  Indication: 41; Score: 9   Patient Information:   3V-CAD without LMCA  A (9)  Indication: 47; Score: 9   Patient Information:   3V-CAD without LMCA With Abnormal LV systolic function  A (9)  Indication: 48; Score: 9   Patient Information:   LMCA-CAD  A (9)  Indication: 49; Score: 9   Patient Information:   2V-CAD with prox LAD PCI  A (7)  Indication: 62; Score: 7   Patient Information:   2V-CAD with prox LAD CABG  A (8)  Indication: 62; Score: 8   Patient Information:   3V-CAD without LMCA With Low CAD burden(i.e., 3 focal stenoses, low SYNTAX score) PCI  A (7)  Indication: 63; Score: 7   Patient Information:   3V-CAD without  LMCA With Low CAD burden(i.e., 3 focal stenoses, low SYNTAX score) CABG  A (9)  Indication: 63; Score: 9   Patient Information:   3V-CAD without LMCA E06c - Intermediate-high CAD burden (i.e., multiple diffuse lesions, presence of CTO, or high SYNTAX score) PCI  U (4)  Indication: 64; Score: 4   Patient Information:   3V-CAD without LMCA E06c - Intermediate-high CAD burden (i.e., multiple diffuse lesions, presence of CTO, or high SYNTAX score) CABG  A (9)  Indication: 64; Score: 9   Patient Information:   LMCA-CAD With Isolated LMCA stenosis  PCI  U (6)  Indication: 65; Score: 6   Patient Information:   LMCA-CAD With Isolated LMCA stenosis  CABG  A (9)  Indication: 65; Score: 9   Patient Information:   LMCA-CAD Additional CAD, low CAD burden (i.e., 1- to 2-vessel additional involvement, low SYNTAX score) PCI  U (5)  Indication: 66; Score: 5   Patient Information:   LMCA-CAD Additional CAD, low CAD burden (i.e., 1- to 2-vessel additional involvement, low SYNTAX score) CABG  A (9)  Indication: 66; Score: 9   Patient Information:   LMCA-CAD Additional CAD, intermediate-high CAD burden (i.e., 3-vessel involvement, presence of CTO, or high SYNTAX score) PCI  I (3)  Indication: 67; Score: 3   Patient Information:   LMCA-CAD Additional CAD, intermediate-high CAD burden (i.e., 3-vessel involvement, presence of CTO, or high SYNTAX score) CABG  A (9)  Indication: 67; Score:  [place]

## 2014-05-22 NOTE — CV Procedure (Signed)
Procedure performed:  Ultrasound-guided access of the right radial artery, Left heart catheterization including hemodynamic monitoring of the left ventricle, LV gram. Selective right and left coronary arteriography. PTCA  mid LAD stenting with 3.0 x 26 mm resolute integrity DES for a high-grade 90% stenosis.   Indication: Patient is a 64 year-old Caucasian male with history of hypertension,  hyperlipidemia,  Diabetes Mellitus, known coronary artery disease with stenting with non-drug-eluting stents to the mid LAD and proximal to mid RCA. This was in 2009. He presents with recurrence of shortness of breath and dyspnea on exertion of class III severe  and abnormal stress test revealing severe inferolateral ischemia. Hence brought to the angiography suite to evaluate his coronary anatomy. Hemodynamic data: Left ventricular pressure was109/0 with a  LVEDP of 9 mm mercury. Aortic pressure was 108/70, mean of 84 mm mercury. There was no pressure gradient across the aortic valve.   Left ventricle: Performed in the RAO projection revealed LVEF of 60%. There was No significant MR. No wall motion abnormality.  Right coronary artery:  Dominant. It is occluded in the proximal to midsegment of the previously stented segment. It is collateralized by the left system, has type 2-3 collaterals.   Left main coronary artery is large and normal. mild calcification is evident.   Circumflex coronary artery: A large vessel giving origin to a small to moderate size OM 1 and a very  large obtuse marginal 2. There is mild calcification and mild diffuse luminal irregularity in the proximal segment. The OM 2 has a 10-20% proximal stenosis.   LAD:  LAD gives origin to a large diagonal-1 which has a ostial 70% to 80% stenosis at most, the LAD is calcified with diffuse 20-30% stenosis in the proximal segment. There is in-stent restenosis of the mid LAD stent after the origin of a moderate to large size D2. The stenosis is about  90%. LAD hasdiffuse mild to moderate luminal irregularity in the proximal to mid segment followed by a smooth vessel distally.   ImpTwo-vessel coronary artery disease with in-stent restenosis in the mid LAD, occluded RCA which is collateralized by the LAD. The lesions appeared to be amenable for PCI.  Interventional data: Successful PTCA and stenting of mid LADwith 3.0 x 26 mm resolute integrity DES for a high-grade 90% stenosis. Will need Dual antiplatelet therapy with Plavix and ASA 81 mg for at least 1 year. I will bring him back for repeat revascularization of the right coronary artery which is a CTO and stress test revealed severe inferolateral ischemia.   Technique of diagnostic cardiac catheterization:  Under sterile precautions using a 6 French right radial  arterial access which was obtained under ultrasound guidance, a 6 French sheath was introduced into the right radial artery. A 5 [place] Tig 4 catheter was advanced into the ascending aorta selective  right coronary artery and left coronary artery was cannulated and angiography was performed in multiple views. The catheter was pulled back Out of the body over exchange length J-wire.  Same Catheter was used to perform LV gram which was performed in RAO projection. Catheter exchanged out of the body over J-Wire. NO immediate complications noted.   Technique of intervention:  Using a 6 [place] XB 3.5 guide catheter the left main  coronary  was selected and cannulated. Using Angiomax for anticoagulation, I utilized a cougar XT 0.014 by 190 cm guidewire and across the left main and LAD coronary artery without any difficulty. I placed the tip of the wire  into the distal  coronary artery. Angiography was performed.   Then I utilized a 3.0 x 10 mm angiosculpt  balloon , I performed balloon angioplasty at 16, 12, 18 atmospheric pressure for 120, 60 and 50 seconds throughout the mid LAD which is heavily calcified and a high-grade stenosis. I proceeded  with implantation of a 3.0 x 26 mm resolute integrity  drug-eluting stent into the mid LAD coronary artery. The stent was deployed at 16 atmospheric pressure for 75  seconds. The stent was then post dilated with a 3.0 x 12 mm [place] Euphora balloon at 86, 16 and 20 atmospheric pressure for 50 seconds each throughout the stented segment. Post-balloon angioplasty results were excellent with 0% residual stenoses and TIMI-3 flow was maintained. There was no evidence of edge dissection. The guidewire was withdrawn out of the body and the guide catheter was engaged and pulled out of the body over the J-wire the was no immediate complication. Hemostasis achieved with TR band. Patient tolerated the procedure well, patient did have chest discomfort in the catheterization lab, EKG revealed normal sinus rhythm without evidence of ischemia. A total of 1 40 mL of contrast was utilized for diagnostic and interventional procedure.   Disposition: Patient will be discharged in  morning unless complications with out-patient follow up.

## 2014-05-22 NOTE — Interval H&P Note (Signed)
History and Physical Interval Note:  05/22/2014 11:37 AM  Daniel Rivers  has presented today for surgery, with the diagnosis of abn stress  The various methods of treatment have been discussed with the patient and family. After consideration of risks, benefits and other options for treatment, the patient has consented to   [facility] practice resident Dr. Dewaine Oats  Observing and patient consents.[name]     Daniel Rivers

## 2014-05-23 DIAGNOSIS — M459 Ankylosing spondylitis of unspecified sites in spine: Secondary | ICD-10-CM | POA: Diagnosis not present

## 2014-05-23 DIAGNOSIS — T82858A Stenosis of vascular prosthetic devices, implants and grafts, initial encounter: Secondary | ICD-10-CM | POA: Diagnosis not present

## 2014-05-23 LAB — GLUCOSE, CAPILLARY
GLUCOSE-CAPILLARY: 127 mg/dL — AB (ref 70–99)
Glucose-Capillary: 125 mg/dL — ABNORMAL HIGH (ref 70–99)

## 2014-05-23 LAB — BASIC METABOLIC PANEL
Anion gap: 8 (ref 5–15)
BUN: 12 mg/dL (ref 6–23)
CHLORIDE: 102 mmol/L (ref 96–112)
CO2: 27 mmol/L (ref 19–32)
Calcium: 8.9 mg/dL (ref 8.4–10.5)
Creatinine, Ser: 0.9 mg/dL (ref 0.50–1.35)
GFR calc Af Amer: >90 mL/min (ref >90)
GFR calc non Af Amer: 88 mL/min — ABNORMAL LOW (ref >90)
Glucose, Bld: 117 mg/dL — ABNORMAL HIGH (ref 70–99)
Potassium: 4.1 mmol/L (ref 3.5–5.1)
Sodium: 137 mmol/L (ref 135–145)

## 2014-05-23 LAB — CBC
HEMATOCRIT: 37.5 % — AB (ref 39.0–52.0)
Hemoglobin: 12.7 g/dL — ABNORMAL LOW (ref 13.0–17.0)
MCH: 29.5 pg (ref 26.0–34.0)
MCHC: 33.9 g/dL (ref 30.0–36.0)
MCV: 87.2 fL (ref 78.0–100.0)
Platelets: 120 10*3/uL — ABNORMAL LOW (ref 150–400)
RBC: 4.3 MIL/uL (ref 4.22–5.81)
RDW: 13.6 % (ref 11.5–15.5)
WBC: 5 10*3/uL (ref 4.0–10.5)

## 2014-05-23 LAB — HEMOGLOBIN A1C
HEMOGLOBIN A1C: 6.5 % — AB (ref 4.8–5.6)
MEAN PLASMA GLUCOSE: 140 mg/dL

## 2014-05-23 MED ORDER — CLOPIDOGREL BISULFATE 75 MG PO TABS: 75.0000 mg | ORAL_TABLET | Freq: Every day | ORAL | Status: DC

## 2014-05-23 MED FILL — Sodium Chloride IV Soln 0.9%: INTRAVENOUS | Qty: 50 | Status: AC

## 2014-05-23 NOTE — Discharge Summary (Signed)
Physician Discharge Summary  Patient ID: Daniel Rivers MRN: [id number] DOB/AGE: 64-Nov-1952 64 y.o.  Admit date: 05/22/2014 Discharge date: 05/23/2014  Primary Discharge Diagnosis: 1. CAD of the native vessel S/P percutaneous coronary angioplasty  PTCA and stenting of mid LAD with 3.0 x 26 mm resolute integrity DES for a high-grade 90% stenosis reduced to 0%. H/O stenting in 2/09 Prox and Mid RCA 3.0x30 and 3.5x12 Driver non-DES; LAD mid 2.75x18 Driver non drug eluting stent. Residual CTO RCA at the stent site. Needs repeat cath/PTCA.  Secondary Discharge Diagnosis Shortness of breath on exertion  Controlled diabetes mellitus type II without complication (T55.7)  Mild hyperlipidemia.  Significant Diagnostic Studies: Coronary angiogram and PTCA 05/22/2014: Hemodynamic data: Left ventricular pressure was109/0 with a LVEDP of 9 mm mercury. Aortic pressure was 108/70, mean of 84 mm mercury. There was no pressure gradient across the aortic valve.   Left ventricle: Performed in the RAO projection revealed LVEF of 60%. There was No significant MR. No wall motion abnormality.  Right coronary artery: Dominant. It is occluded in the proximal to midsegment of the previously stented segment. It is collateralized by the left system, has type 2-3 collaterals.   Left main coronary artery is large and normal. mild calcification is evident.   Circumflex coronary artery: A large vessel giving origin to a small to moderate size OM 1 and a very large obtuse marginal 2. There is mild calcification and mild diffuse luminal irregularity in the proximal segment. The OM 2 has a 10-20% proximal stenosis.   LAD: LAD gives origin to a large diagonal-1 which has a ostial 70% to 80% stenosis at most, the LAD is calcified with diffuse 20-30% stenosis in the proximal segment. There is in-stent restenosis of the mid LAD stent after the origin of a moderate to large size D2. The stenosis is about 90%. LAD hasdiffuse  mild to moderate luminal irregularity in the proximal to mid segment followed by a smooth vessel distally.   ImpTwo-vessel coronary artery disease with in-stent restenosis in the mid LAD, occluded RCA which is collateralized by the LAD. The lesions appeared to be amenable for PCI.  Interventional data: Successful PTCA and stenting of mid LADwith 3.0 x 26 mm resolute integrity DES for a high-grade 90% stenosis. Will need Dual antiplatelet therapy with Plavix and ASA 81 mg for at least 1 year. I will bring him back for repeat revascularization of the right coronary artery which is a CTO and stress test revealed severe inferolateral ischemia.   Hospital Course: Patient admitted on elective basis  Due to abnormal stress test, ongoing symptoms of class III dyspnea on exertion even with minimal activities, history of known coronary artery disease, he was scheduled for repeat coronary angiography.  He underwent successful angioplasty and the following morning was stable for discharge.  He had no EKG changes, although he did have chest pain after angioplasty.  Otherwise labs remained stable and right radial sided healed well hence felt stable for discharge.  He will be scheduled for elective repeat coronary angiography and attempted angioplasty of the RCA which is CTO at previously stented segment, which appears to be amenable. He was started on Plavix along with aspirin which was continued from previous.  Otherwise no changes in the medications were done.  Discharge Exam: Blood pressure 132/69, pulse 81, temperature 98.4 F (36.9 C), temperature source Oral, resp. rate 15, height 6\' 3"  (1.905 m), weight 110 kg (242 lb 8.1 oz), SpO2 95 %.    General  appearance: alert, cooperative, appears older than stated age, no distress and mildly obese Resp: clear to auscultation bilaterally Cardio: regular rate and rhythm, S1, S2 normal, no murmur, click, rub or gallop GI: soft, non-tender; bowel sounds normal; no  masses,  no organomegaly Extremities: extremities normal, atraumatic, no cyanosis or edema Pulses: 2+ and symmetric Neurologic: Grossly normal  Labs:   Lab Results  Component Value Date   WBC 5.0 05/23/2014   HGB 12.7* 05/23/2014   HCT 37.5* 05/23/2014   MCV 87.2 05/23/2014   PLT 120* 05/23/2014    Recent Labs Lab 05/16/14 1316 05/23/14 0401  NA 140 137  K 4.1 4.1  CL  --  102  CO2 28 27  BUN 13.9 12  CREATININE 0.9 0.90  CALCIUM 9.3 8.9  PROT 7.1  --   BILITOT 1.18  --   ALKPHOS 57  --   ALT 31  --   AST 34  --   GLUCOSE 118 117*     EKG 05/23/2014: Normal sinus rhythm at the rate of 71 bpm, normal axis, inferior infarct old.  LVH by voltage criteria.  Compared to prior EKG on 04/19/016, inferior T wave inversion new.   Radiology: No results found.    FOLLOW UP PLANS AND APPOINTMENTS Discharge Instructions    Amb Referral to Cardiac Rehabilitation    Complete by:  As directed             Medication List    STOP taking these medications        meloxicam 7.5 MG tablet  Commonly known as:  MOBIC      TAKE these medications        ACCU-CHEK FASTCLIX LANCETS Misc     AMBIEN 10 MG tablet  Generic drug:  zolpidem  Take 10 mg by mouth at bedtime as needed for sleep.     aspirin 81 MG tablet  Take 81 mg by mouth daily.     bisacodyl 5 MG EC tablet  Commonly known as:  DULCOLAX  Take 5 mg by mouth at bedtime. 2 at bedtime     buPROPion 300 MG 24 hr tablet  Commonly known as:  WELLBUTRIN XL  Take 300 mg by mouth daily.     clopidogrel 75 MG tablet  Commonly known as:  PLAVIX  Take 1 tablet (75 mg total) by mouth daily with breakfast.     COREG 3.125 MG tablet  Generic drug:  carvedilol  Take 3.125 mg by mouth 2 (two) times daily.     furosemide 20 MG tablet  Commonly known as:  LASIX  Take 20 mg by mouth as needed for fluid.     glimepiride 2 MG tablet  Commonly known as:  AMARYL  Take 1 mg by mouth daily before breakfast. Takes 1/2  every morning     HYDROcodone-acetaminophen 7.5-325 MG per tablet  Commonly known as:  NORCO  Take 1 tablet by mouth every 6 (six) hours as needed. 1 month supply     isosorbide mononitrate 60 MG 24 hr tablet  Commonly known as:  IMDUR  Take 1 tablet by mouth daily.     KRILL OIL PO  Take 1 tablet by mouth daily.     losartan 100 MG tablet  Commonly known as:  COZAAR  Take 100 mg by mouth daily.     Magnesium Oxide 400 MG Caps  Take 1 capsule by mouth daily.     METAMUCIL PO  Take 17 g/day  by mouth 2 (two) times daily.     metFORMIN 1000 MG tablet  Commonly known as:  GLUCOPHAGE  Take 1,000 mg by mouth every 12 (twelve) hours.     morphine 60 MG 12 hr tablet  Commonly known as:  MS CONTIN  Take 1 tablet (60 mg total) by mouth 3 (three) times daily.     multivitamin capsule  Take 1 capsule by mouth daily. NO IRON     NITROSTAT 0.4 MG SL tablet  Generic drug:  nitroGLYCERIN  Place 0.4 mg under the tongue every 5 (five) minutes as needed for chest pain.     polyethylene glycol powder powder  Commonly known as:  GLYCOLAX/MIRALAX  Using measuring cap mix  17gm in water and drink two times daily     pravastatin 40 MG tablet  Commonly known as:  PRAVACHOL  Take 40 mg by mouth daily.     Vitamin D3 2000 UNITS Tabs  Take 1 tablet by mouth daily.           Follow-up Information    Follow up with Laverda Page, MD.   Specialty:  Cardiology   Why:  Keep previous appointment   Contact information:   [address] [place] [address] [phone]        Laverda Page, MD 05/23/2014, 12:05 PM  Pager: [phone] Office: [phone] If no answer: [phone]

## 2014-05-23 NOTE — Progress Notes (Signed)
CARDIAC REHAB PHASE I   PRE:  Rate/Rhythm: 80 SR  BP:  Sitting: 131/88       SaO2: 99 RA  MODE:  Ambulation: 480 ft   POST:  Rate/Rhythm: 101 ST  BP:  Sitting: 154/73         SaO2: 100 RA  Pt initially refusing to ambulate, appears frustrated states "what good is it."  After talking more agreeable. Pt ambulated 480 ft on RA, handheld assist with cane from home, steady gait, tolerated well.  Pt denies cp, dizziness, SOB. Completed stent education.  Reviewed anti-platelet therapy, stent card, activity restrictions, ntg, exercise, heart healthy diet, carb counting, portion control, phase 2 cardiac rehab. Pt verbalized understanding. States he "knows what he has to do." Pt agrees to phase 2 cardiac rehab, even though he states he "doesn't like group activities." Will send referral to [facility].  Pt states he will be returning for a staged PCI in 2 weeks.      [id number]   Lenna Sciara, RN, BSN 05/23/2014 9:31 AM

## 2014-05-24 ENCOUNTER — Other Ambulatory Visit: Payer: Self-pay | Admitting: Physical Medicine & Rehabilitation

## 2014-06-07 ENCOUNTER — Encounter (HOSPITAL_COMMUNITY)
Admission: RE | Disposition: A | Discharge status: Home / Self Care | Payer: Medicare Other | Source: Ambulatory Visit | Attending: Cardiology

## 2014-06-07 ENCOUNTER — Ambulatory Visit (HOSPITAL_COMMUNITY): Admit: 2014-06-07 | Payer: Medicare Other | Admitting: Cardiology

## 2014-06-07 ENCOUNTER — Ambulatory Visit (HOSPITAL_COMMUNITY)
Admission: RE | Admit: 2014-06-07 | Discharge: 2014-06-08 | Disposition: A | Discharge status: Home / Self Care | Payer: Medicare Other | Source: Ambulatory Visit | Attending: Cardiology | Admitting: Cardiology

## 2014-06-07 ENCOUNTER — Encounter (HOSPITAL_COMMUNITY): Payer: Self-pay | Admitting: Cardiology

## 2014-06-07 ENCOUNTER — Encounter (HOSPITAL_COMMUNITY): Payer: Self-pay

## 2014-06-07 DIAGNOSIS — I10 Essential (primary) hypertension: Secondary | ICD-10-CM | POA: Diagnosis not present

## 2014-06-07 DIAGNOSIS — R079 Chest pain, unspecified: Secondary | ICD-10-CM | POA: Diagnosis present

## 2014-06-07 DIAGNOSIS — Z87891 Personal history of nicotine dependence: Secondary | ICD-10-CM | POA: Diagnosis not present

## 2014-06-07 DIAGNOSIS — E669 Obesity, unspecified: Secondary | ICD-10-CM | POA: Insufficient documentation

## 2014-06-07 DIAGNOSIS — Z7982 Long term (current) use of aspirin: Secondary | ICD-10-CM | POA: Diagnosis not present

## 2014-06-07 DIAGNOSIS — I2510 Atherosclerotic heart disease of native coronary artery without angina pectoris: Secondary | ICD-10-CM | POA: Insufficient documentation

## 2014-06-07 DIAGNOSIS — E119 Type 2 diabetes mellitus without complications: Secondary | ICD-10-CM | POA: Insufficient documentation

## 2014-06-07 DIAGNOSIS — E785 Hyperlipidemia, unspecified: Secondary | ICD-10-CM | POA: Insufficient documentation

## 2014-06-07 DIAGNOSIS — Z886 Allergy status to analgesic agent status: Secondary | ICD-10-CM | POA: Diagnosis not present

## 2014-06-07 DIAGNOSIS — Z9861 Coronary angioplasty status: Secondary | ICD-10-CM

## 2014-06-07 DIAGNOSIS — Z6830 Body mass index (BMI) 30.0-30.9, adult: Secondary | ICD-10-CM | POA: Insufficient documentation

## 2014-06-07 DIAGNOSIS — I209 Angina pectoris, unspecified: Secondary | ICD-10-CM

## 2014-06-07 HISTORY — PX: CARDIAC CATHETERIZATION: SHX172

## 2014-06-07 HISTORY — PX: PERCUTANEOUS CORONARY STENT INTERVENTION (PCI-S): SHX6016

## 2014-06-07 HISTORY — DX: Atherosclerotic heart disease of native coronary artery without angina pectoris: I25.10

## 2014-06-07 LAB — GLUCOSE, CAPILLARY
GLUCOSE-CAPILLARY: 144 mg/dL — AB (ref 70–99)
GLUCOSE-CAPILLARY: 165 mg/dL — AB (ref 70–99)
Glucose-Capillary: 116 mg/dL — ABNORMAL HIGH (ref 70–99)
Glucose-Capillary: 208 mg/dL — ABNORMAL HIGH (ref 70–99)

## 2014-06-07 LAB — POCT ACTIVATED CLOTTING TIME: Activated Clotting Time: 478 seconds

## 2014-06-07 SURGERY — CORONARY STENT INTERVENTION

## 2014-06-07 SURGERY — PERCUTANEOUS CORONARY STENT INTERVENTION (PCI-S)

## 2014-06-07 MED ORDER — PRAVASTATIN SODIUM 80 MG PO TABS
80.0000 mg | ORAL_TABLET | Freq: Every day | ORAL | Status: DC
  Administered 2014-06-07 – 2014-06-08 (×2): 80 mg via ORAL
  Filled 2014-06-07 (×2): qty 1

## 2014-06-07 MED ORDER — NITROGLYCERIN 1 MG/10 ML FOR IR/CATH LAB
INTRA_ARTERIAL | Status: DC | PRN
Start: 2014-06-07 — End: 2014-06-07
  Administered 2014-06-07 (×2): 200 ug via INTRA_ARTERIAL

## 2014-06-07 MED ORDER — GLIMEPIRIDE 1 MG PO TABS
1.0000 mg | ORAL_TABLET | Freq: Every day | ORAL | Status: DC
  Administered 2014-06-08: 1 mg via ORAL
  Filled 2014-06-07 (×2): qty 1

## 2014-06-07 MED ORDER — HEPARIN (PORCINE) IN NACL 2-0.9 UNIT/ML-% IJ SOLN
INTRAMUSCULAR | Status: AC
  Filled 2014-06-07: qty 1000

## 2014-06-07 MED ORDER — NITROGLYCERIN 0.4 MG SL SUBL: 0.4000 mg | SUBLINGUAL_TABLET | SUBLINGUAL | Status: DC | PRN

## 2014-06-07 MED ORDER — ASPIRIN 81 MG PO CHEW
CHEWABLE_TABLET | ORAL | Status: AC
  Administered 2014-06-07: 81 mg via ORAL
  Filled 2014-06-07: qty 1

## 2014-06-07 MED ORDER — SODIUM CHLORIDE 0.9 % IV SOLN: 250.0000 mL | INTRAVENOUS | Status: DC | PRN

## 2014-06-07 MED ORDER — IOHEXOL 350 MG/ML SOLN
INTRAVENOUS | Status: DC | PRN
  Administered 2014-06-07: 145 mL via INTRA_ARTERIAL

## 2014-06-07 MED ORDER — VERAPAMIL HCL 2.5 MG/ML IV SOLN
INTRAVENOUS | Status: DC | PRN
  Administered 2014-06-07: 11:00:00 via INTRA_ARTERIAL

## 2014-06-07 MED ORDER — BIVALIRUDIN 250 MG IV SOLR
INTRAVENOUS | Status: AC
  Filled 2014-06-07: qty 250

## 2014-06-07 MED ORDER — MIDAZOLAM HCL 2 MG/2ML IJ SOLN
INTRAMUSCULAR | Status: DC | PRN
  Administered 2014-06-07 (×2): 1 mg via INTRAVENOUS

## 2014-06-07 MED ORDER — ASPIRIN 81 MG PO CHEW
81.0000 mg | CHEWABLE_TABLET | ORAL | Status: AC
  Administered 2014-06-07: 81 mg via ORAL

## 2014-06-07 MED ORDER — ZOLPIDEM TARTRATE 5 MG PO TABS
10.0000 mg | ORAL_TABLET | Freq: Every evening | ORAL | Status: DC | PRN
  Administered 2014-06-07: 10 mg via ORAL
  Filled 2014-06-07: qty 2

## 2014-06-07 MED ORDER — LOSARTAN POTASSIUM 50 MG PO TABS
100.0000 mg | ORAL_TABLET | Freq: Every day | ORAL | Status: DC
  Administered 2014-06-07 – 2014-06-08 (×2): 100 mg via ORAL
  Filled 2014-06-07 (×2): qty 2

## 2014-06-07 MED ORDER — SODIUM CHLORIDE 0.9 % IJ SOLN: 3.0000 mL | Freq: Two times a day (BID) | INTRAMUSCULAR | Status: DC

## 2014-06-07 MED ORDER — VERAPAMIL HCL 2.5 MG/ML IV SOLN
INTRAVENOUS | Status: AC
  Filled 2014-06-07: qty 2

## 2014-06-07 MED ORDER — SODIUM CHLORIDE 0.9 % IJ SOLN: 3.0000 mL | INTRAMUSCULAR | Status: DC | PRN

## 2014-06-07 MED ORDER — ASPIRIN EC 81 MG PO TBEC
81.0000 mg | DELAYED_RELEASE_TABLET | Freq: Every day | ORAL | Status: DC
  Administered 2014-06-08: 81 mg via ORAL
  Filled 2014-06-07: qty 1

## 2014-06-07 MED ORDER — ISOSORBIDE MONONITRATE ER 60 MG PO TB24
60.0000 mg | ORAL_TABLET | Freq: Every day | ORAL | Status: DC
  Administered 2014-06-08: 60 mg via ORAL
  Filled 2014-06-07 (×2): qty 1

## 2014-06-07 MED ORDER — SODIUM CHLORIDE 0.9 % IV SOLN
1.7500 mg/kg/h | INTRAVENOUS | Status: DC
  Filled 2014-06-07: qty 250

## 2014-06-07 MED ORDER — HYDROMORPHONE HCL 2 MG/ML IJ SOLN
INTRAMUSCULAR | Status: DC | PRN
  Administered 2014-06-07: 0.5 mg via INTRAVENOUS

## 2014-06-07 MED ORDER — LIDOCAINE HCL (PF) 1 % IJ SOLN
INTRAMUSCULAR | Status: AC
  Filled 2014-06-07: qty 30

## 2014-06-07 MED ORDER — TICAGRELOR 90 MG PO TABS
ORAL_TABLET | ORAL | Status: DC | PRN
  Administered 2014-06-07: 180 mg via ORAL

## 2014-06-07 MED ORDER — SODIUM CHLORIDE 0.9 % IV SOLN
INTRAVENOUS | Status: AC
  Administered 2014-06-07: 13:00:00 via INTRAVENOUS

## 2014-06-07 MED ORDER — HYDROMORPHONE HCL 1 MG/ML IJ SOLN
INTRAMUSCULAR | Status: AC
  Filled 2014-06-07: qty 1

## 2014-06-07 MED ORDER — FUROSEMIDE 20 MG PO TABS: 20.0000 mg | ORAL_TABLET | ORAL | Status: DC | PRN

## 2014-06-07 MED ORDER — BISACODYL 5 MG PO TBEC
5.0000 mg | DELAYED_RELEASE_TABLET | Freq: Every day | ORAL | Status: DC
  Administered 2014-06-07: 5 mg via ORAL
  Filled 2014-06-07: qty 1

## 2014-06-07 MED ORDER — PSYLLIUM 95 % PO PACK
1.0000 | PACK | Freq: Every day | ORAL | Status: DC
  Filled 2014-06-07 (×2): qty 1

## 2014-06-07 MED ORDER — SODIUM CHLORIDE 0.9 % WEIGHT BASED INFUSION
3.0000 mL/kg/h | INTRAVENOUS | Status: DC
  Administered 2014-06-07: 3 mL/kg/h via INTRAVENOUS

## 2014-06-07 MED ORDER — ACETAMINOPHEN 325 MG PO TABS: 650.0000 mg | ORAL_TABLET | ORAL | Status: DC | PRN

## 2014-06-07 MED ORDER — CARVEDILOL 3.125 MG PO TABS
3.1250 mg | ORAL_TABLET | Freq: Two times a day (BID) | ORAL | Status: DC
  Administered 2014-06-07 – 2014-06-08 (×2): 3.125 mg via ORAL
  Filled 2014-06-07 (×3): qty 1

## 2014-06-07 MED ORDER — POLYETHYLENE GLYCOL 3350 17 GM/SCOOP PO POWD
1.0000 | Freq: Once | ORAL | Status: DC
  Filled 2014-06-07: qty 255

## 2014-06-07 MED ORDER — OXYCODONE-ACETAMINOPHEN 5-325 MG PO TABS: 1.0000 | ORAL_TABLET | ORAL | Status: DC | PRN

## 2014-06-07 MED ORDER — NITROGLYCERIN 1 MG/10 ML FOR IR/CATH LAB
INTRA_ARTERIAL | Status: AC
  Filled 2014-06-07: qty 10

## 2014-06-07 MED ORDER — SODIUM CHLORIDE 0.9 % IV SOLN
250.0000 mg | INTRAVENOUS | Status: DC | PRN
  Administered 2014-06-07: 1.75 mg/kg/h via INTRAVENOUS

## 2014-06-07 MED ORDER — ZOLPIDEM TARTRATE 10 MG PO TABS: 10.0000 mg | ORAL_TABLET | Freq: Every evening | ORAL | Status: DC | PRN

## 2014-06-07 MED ORDER — ASPIRIN 81 MG PO TABS: 81.0000 mg | ORAL_TABLET | Freq: Every day | ORAL | Status: DC

## 2014-06-07 MED ORDER — HYDROCODONE-ACETAMINOPHEN 7.5-325 MG PO TABS: 1.0000 | ORAL_TABLET | Freq: Four times a day (QID) | ORAL | Status: DC | PRN

## 2014-06-07 MED ORDER — TICAGRELOR 90 MG PO TABS
90.0000 mg | ORAL_TABLET | Freq: Two times a day (BID) | ORAL | Status: DC
  Administered 2014-06-07 – 2014-06-08 (×2): 90 mg via ORAL
  Filled 2014-06-07 (×2): qty 1

## 2014-06-07 MED ORDER — BIVALIRUDIN BOLUS VIA INFUSION
INTRAVENOUS | Status: DC | PRN
  Administered 2014-06-07: 11:00:00 via INTRAVENOUS

## 2014-06-07 MED ORDER — MORPHINE SULFATE ER 15 MG PO TBCR
60.0000 mg | EXTENDED_RELEASE_TABLET | Freq: Three times a day (TID) | ORAL | Status: DC
  Administered 2014-06-07 – 2014-06-08 (×2): 60 mg via ORAL
  Filled 2014-06-07 (×2): qty 4

## 2014-06-07 MED ORDER — MIDAZOLAM HCL 2 MG/2ML IJ SOLN
INTRAMUSCULAR | Status: AC
  Filled 2014-06-07: qty 2

## 2014-06-07 MED ORDER — SODIUM CHLORIDE 0.9 % WEIGHT BASED INFUSION: 1.0000 mL/kg/h | INTRAVENOUS | Status: DC

## 2014-06-07 MED ORDER — DIAZEPAM 5 MG PO TABS: 5.0000 mg | ORAL_TABLET | ORAL | Status: DC | PRN

## 2014-06-07 MED ORDER — TICAGRELOR 90 MG PO TABS
ORAL_TABLET | ORAL | Status: AC
  Filled 2014-06-07: qty 2

## 2014-06-07 MED ORDER — ALUM & MAG HYDROXIDE-SIMETH 200-200-20 MG/5ML PO SUSP
30.0000 mL | ORAL | Status: DC | PRN
  Administered 2014-06-07: 30 mL via ORAL
  Filled 2014-06-07: qty 30

## 2014-06-07 MED ORDER — ONDANSETRON HCL 4 MG/2ML IJ SOLN: 4.0000 mg | Freq: Four times a day (QID) | INTRAMUSCULAR | Status: DC | PRN

## 2014-06-07 MED ORDER — BUPROPION HCL ER (XL) 300 MG PO TB24
300.0000 mg | ORAL_TABLET | Freq: Every day | ORAL | Status: DC
  Administered 2014-06-08: 08:00:00 300 mg via ORAL
  Filled 2014-06-07 (×2): qty 1

## 2014-06-07 SURGICAL SUPPLY — 18 items
BALLN EMERGE MR 2.0X12 (BALLOONS) ×3
BALLN EMERGE MR 3.0X30 (BALLOONS) ×2 IMPLANT
BALLN EMERGE MR PUSH 1.20X12 (BALLOONS) ×3
BALLN ~~LOC~~ EUPHORA RX 3.5X12 (BALLOONS) ×3
BALLOON EMERGE MR 2.0X12 (BALLOONS) IMPLANT
BALLOON EMERGE MR PUSH 1.20X12 (BALLOONS) IMPLANT
BALLOON ~~LOC~~ EUPHORA RX 3.5X12 (BALLOONS) IMPLANT
CATH HEARTRAIL IKARI 6F IR1.0 (CATHETERS) ×2 IMPLANT
GLIDESHEATH SLEND A-KIT 6F 20G (SHEATH) ×2 IMPLANT
GUIDEWIRE ANGLED .035X150CM (WIRE) ×2 IMPLANT
KIT ENCORE 26 ADVANTAGE (KITS) ×3 IMPLANT
KIT HEART LEFT (KITS) ×2 IMPLANT
PACK CARDIAC CATHETERIZATION (CUSTOM PROCEDURE TRAY) ×2 IMPLANT
STENT RESOLUTE INTEG 3.0X38 (Permanent Stent) ×2 IMPLANT
STENT RESOLUTE INTEG 3.5X26 (Permanent Stent) ×2 IMPLANT
TUBING CIL FLEX 10 FLL-RA (TUBING) ×3 IMPLANT
WIRE ASAHI MIRACLEBROS-6 180CM (WIRE) ×2 IMPLANT
WIRE SAFE-T 1.5MM-J .035X260CM (WIRE) ×2 IMPLANT

## 2014-06-07 NOTE — H&P (View-Only) (Signed)
Office visit notes copied to [employer]. Giuliani 04/18/2014 8:55 AM Location: [facility] Patient #: [id number] DOB: 04/04/1950 Widowed / Language: Cleophus Molt / Race: White Male    History of Present Illness(Jagadeesh Carlynn Herald, MD; 04/18/2014 10:17 PM) Patient words: f/u cp, abn nuc; Pt c/o sobr. Pt denies dizziness.  The patient is a 64 year old male who presents for a follow-up for CAD. Mr. Breton Berns is a very pleasant 64 year old Caucasian male with history of ankylosing spondylitis, degenerative joint disease and history of known coronary artery disease and angioplasty in 2009 presents here for followup.   I had seen him about a month ago, was the past 2-3 months, he has been having exertional chest pain. A month ago he was able to walk at least 2 miles, however now he states that even walking minimal distances he has to stop and rest due to severe chest pain and shortness of breath. He underwent stress testing and echocardiogram and presents here for follow-up. He suspect that he has significant progression of coronary artery disease. He is extremely worried about this chest discomfort which he states is worsening even since his last OV. He has not used any s/l NTG but states he has come close to several times. He has not had any PND or orthopnea. He denies any palpitations, TIA, claudication.     Problem List/Past Medical([name]; 04/18/2014 3:29 PM) History of hemochromatosis (Z86.39) Controlled diabetes mellitus type II without complication (B63.8). Labs 03/26/2014: CBC normal, serum glucose 117, creatinine 0.95, CMP otherwise normal, HbA1c 6.6%, total cholesterol 123, HDL 35, LDL 58, triglycerides 152, TSH 1.510 Postsurgical percutaneous transluminal coronary angioplasty status (Z98.61) Ankylosing spondylitis of multiple sites in spine (M45.0). 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 Labs 03/26/2014: CBC normal, serum glucose 117, creatinine 0.95, CMP normal, HbA1c 6.6%, total cholesterol 123, HDL 35, LDL 58, triglycerides 152, TSH 1.510 CAD S/P percutaneous coronary angioplasty (I25.10). CAD/ASHD:. S/P PTCA and stenting in 2/09 Prox and Mid RCA 3.0x30 and 3.5x12 Driver non-DES; LAD mid 2.75x18 Driver non drug eluting stent.  Exercise myoview stress 04/13/2014: 1. The resting electrocardiogram demonstrated normal sinus rhythm, normal resting conduction, no resting arrhythmias and normal rest repolarization. The stress electrocardiogram was normal. The patient performed treadmill exercise using a Bruce protocol, completing 4:30 minutes. The patient completed an estimated workload of 6.33 METS, 101% of the maximum predicted heart rate. The stress test was terminated because of fatigue. 2. This is an abnormal myocardial perfusion imaging study demonstrating a mixture of scar plus ischemia in the basal inferior, mid inferior, mid inferolateral, apical inferior and apical lateral myocardial wall(s). The left ventricular ejection fraction was calculated or visually estimated to be 66%. Compared to the prior study from 12/28/2011, the current study reveals new ischemic changes. This represents a high risk scan, consider further cardiac workup.  Echo 05/10/08: echocardiogram within normal limits.    Allergies([name]; 04/18/2014 3:29 PM) Naproxen *ANALGESICS - ANTI-INFLAMMATORY*. Rash. Amlodipine Besy-Benazepril HCl *ANTIHYPERTENSIVES*. Rash.    Family History([name]; 04/18/2014 3:29 PM) married, 1 child, mother is diabetic,has chf,;father died age 63 from a cva; 3 sister's one is a diabetic; one has fibromyalgia; one has hypertension, hemochromotosis    Social History([name]; 04/18/2014 3:29 PM) prior smoker, no alcohol    Medication History(Christie  Beane; 04/18/2014 3:38 PM) Nitrostat (0.4MG  Tab Sublingual, 1 (one) Tab Sublingual  Sublingual Keep under tongue for chest pain every 5 minutes for chest pain as needed, Taken starting 11/16/2011) Active. Cozaar (100MG  Tablet, 1 Oral daily) Active. Hydrocodone-Acetaminophen (7.5-325MG  Tablet, 1 Oral every 6 hours, as needed) Active. Polyethylene Glycol 3350 (17 gm Oral two times daily) Active. Dulcolax (5MG  Tablet DR, 2 Oral at bedtime) Active. Magnesium Oxide (400MG  Tablet, 1 Oral daily) Active. Vitamin D3 (2000UNIT Capsule, 1 Oral daily) Active. Multivitamins (1 Oral daily) Active. Simvastatin (40MG  Tablet, 1/4 Oral at bedtime, Stopped taking 04/18/2014) Discontinued. (Switched to Pravastatin) Coreg (3.125MG  Tablet, 1 Oral two times daily) Active. Morphine Sulfate ER (60MG  Tablet ER 12HR, 1 Oral three times daily) Active. Ambien (10MG  Tablet, 1 Oral at bedtime prn) Active. Aspirin (81MG  Tablet, 1 Oral daily) Active. Omega-3 (1000MG  Capsule, 1 Oral daily) Active. Metamucil (17 gm two times daily) Active. MetFORMIN HCl (1000MG  Tablet, 1 Oral every 12 hours) Active. Vitamin B12 (1 Oral daily) Specific dose unknown - Active. Allegra Allergy (180MG  Tablet, 1 Oral daily prn) Active. BuPROPion HCl ER (XL) (300MG  Tablet ER 24HR, 1 Oral daily) Active. Furosemide (20MG  Tablet, 1 Oral as needed) Active. Amaryl (2MG  Tablet, 1/2 Oral daily) Active. Mobic (7.5MG  Tablet, 1 Oral as needed) Active.    Diagnostic Studies History([name]; 04/18/2014 3:29 PM) Echocardiogram. 11/23/2011 Echo 11/23/11 1. Left ventricular internal dimension is decreased. Asymmetric septal wall hypertrophy. Normal global wall motion. Normal systolic global function. Calculated EF 56%. 2. Mild calcification of the mitral annulus. Nuclear stress test. 12/28/2011 Mild diaphragmatic attenuation without ischemia. Low risk stress.  1. Resting EKG NSR. Stress EKG was non diagnostic for ischemia. No ST-T changes of  ischemia noted with pharmacologic stress testing. Stress symptoms included SHORTNESS OF BREATH AND LIGHT HEADEDNESS. Stress terminated due to completion of protocol. 2. The perfusion study demonstrated mild diaphragmatic attenuation artifact in the inferior wall. There was no evidence of ischemia or scar. Dynamic gated images reveal normal wall motion and endocardial thickening. Left ventricular ejection fraction was estimated to be 85%. 3. This represents a low risk study. Echocardiogram. 05/01/2013 Study is technically limited due to pt's body habitus. 1. Left ventricular cavity is normal in size. Moderate concentric hypertrophy. Septum appears more hypertrophied than post. wall. Diastolic filling with impaired relaxation pattern. Normal global wall motion. Normal systolic global function. Calculated EF 57%. Doppler evidence of Grade I (impaired) diastolic dysfunction. 2. Normal trileaflet aortic valve, with normal systolic flow pattern, with no regurgitation noted. 3. Mild calcification of the mitral annulus. Mitral valve inflow A > E ratio. 4. Tricuspid valve structurally normal. Trace tricuspid regurgitation. 5. The aortic root is mildly dilated. Mildly dilated ascending aorta. Nuclear Stress Test. 04/13/2014 1. The resting electrocardiogram demonstrated normal sinus rhythm, normal resting conduction, no resting arrhythmias and normal rest repolarization. The stress electrocardiogram was normal. The patient performed treadmill exercise using a Bruce protocol, completing 4:30 minutes. The patient completed an estimated workload of 6.33 METS, 101% of the maximum predicted heart rate. The stress test was terminated because of fatigue. 2. This is an abnormal myocardial perfusion imaging study demonstrating a mixture of scar plus ischemia in the basal inferior, mid inferior, mid inferolateral, apical inferior and apical lateral myocardial wall(s). The left ventricular ejection  fraction was calculated or visually estimated to be 66%. Compared to the prior study from 12/28/2011, the current study reveals new ischemic changes. This represents a high risk scan, consider further cardiac workup. CAD/ASHD:. S/P PTCA and stenting in 2/09 Prox and Mid RCA 3.0x30 and 3.5x12 Driver non-DES; LAD mid 2.75x18  Driver. heart cath 2/09 Prox and Mid RCA 3.0x30 and 3.5x12 Driver non-DES; LAD mid 2.75x18 Driver. ECG 10/16/10: NSR, borderline low voltage otherwise normal. Nuclear stress on 10/18/09 No ischemia normal LV systolic function, low risk study. Done for dyspnea. No change compared to 05/10/08. Echo 05/10/08: echocardiogram within normal limits.    Review of Systems(Jagadeesh Carlynn Herald, MD; 04/18/2014 10:17 PM) General:Present- Feeling well and Obesity. Not Present- Anorexia, Appetite Loss, Fatigue and Tiredness. Respiratory:Present- Difficulty Breathing on Exertion. Not Present- Hemoptysis and Wakes up from Sleep Wheezing or Short of Breath. Cardiovascular:Present- Chest Pain. Not Present- Calf Cramps, Difficulty Breathing Lying Down and Fainting / Blacking Out. Musculoskeletal:Present- Backache, Joint Pain, Joint Stiffness and Leg Weakness. Neurological:Not Present- Decreased Memory and Difficulty Speaking. Endocrine:Not Present- Excessive Thirst, Polydipsia and Polyuria. All other systems negative    Vitals([name]; 04/18/2014 3:42 PM) 04/18/2014 3:29 PM Weight: 255.56 lb Height: 75 in Body Surface Area: 2.48 m Body Mass Index: 31.94 kg/m Pulse: 89 (Regular) P.OX: 96% (Room air) BP: 116/78 (Sitting, Left Arm, Standard)     Physical Exam(Jagadeesh Carlynn Herald, MD; 04/18/2014 10:17 PM) The physical exam findings are as follows:   General Mental Status- Alert. General Appearance- Cooperative and Well groomed. Not in acute distress. Orientation- Oriented to time, Oriented to place, Oriented to purpose and Oriented to person. Build &  Nutrition- Well developed and Obese. Hydration- Well hydrated.   Integumentary General Characteristics:Overall examination of the patient's skin reveals - no suspicious lesions, no bruises and no evidence of scars. Color- normal coloration of skin. Skin Moisture- normal skin moisture.   Head and Neck Head- normocephalic, atraumatic with no lesions or palpable masses. Neck Global Assessment- full range of motion and supple. no bruit auscultated on the right, no bruit auscultated on the left, non-tender, no lymphadenopathy, no palpable mass on the right, no palpable mass on the left and no nucchal rigidty. Thyroid Gland Characteristics- normal size and consistency and no palpable nodules. non-tender.   Chest and Lung Exam Chest and lung exam reveals - normal excursion with symmetric chest walls, quiet, even and easy respiratory effort with no use of accessory muscles and on auscultation, normal breath sounds, no adventitious sounds and normal vocal resonance.   Cardiovascular Inspection:Jugular vein- Bilateral- Inspection Normal. Palpation/Percussion:Carotid Artery- Bilateral- Normal pulsations. Auscultation:Heart Sounds- Normal heart sounds.   Abdomen Inspection:Inspection of the abdomen reveals - No Visible peristalsis, No Abnormal pulsations, No Paradoxical movements and No Hernias. Skin:Inspection of the skin of the abdomen reveals - No Stria and No Scars.   Peripheral Vascular Lower Extremity:Inspection- Left- Pink skin. No Cyanotic nailbeds or Ulcerations. Right- Pink skin. No Cyanotic nailbeds or Ulcerations. Palpation:Temperature- Left- Warm. Right- Warm. Tenderness- Left- Non Tender. Right- Non Tender. Edema- Left- No edema. Right- No edema.   Neurologic Mental Status:Affect- appropriate. Speech- Normal. Cranial Nerves:- Normal Bilaterally. Sensory:- Normal. Motor: Bulk and Contour:- Normal. Tone:-  Normal. Strength:5/5 normal muscle strength- All Muscles. Coordination- Normal.   Musculoskeletal- Did not examine.    Assessment & Plan(Jagadeesh Carlynn Herald, MD; 04/18/2014 10:18 PM) CAD S/P percutaneous coronary angioplasty (I25.10) Story: CAD/ASHD:. S/P PTCA and stenting in 2/09 Prox and Mid RCA 3.0x30 and 3.5x12 Driver non-DES; LAD mid 2.75x18 Driver non drug eluting stent.  Exercise myoview stress 04/13/2014: 1. The resting electrocardiogram demonstrated normal sinus rhythm, normal resting conduction, no resting arrhythmias and normal rest repolarization. The stress electrocardiogram was normal. The patient performed treadmill exercise using a Bruce protocol, completing 4:30 minutes. The patient completed an estimated workload of 6.33  METS, 101% of the maximum predicted heart rate. The stress test was terminated because of fatigue. 2. This is an abnormal myocardial perfusion imaging study demonstrating a mixture of scar plus ischemia in the basal inferior, mid inferior, mid inferolateral, apical inferior and apical lateral myocardial wall(s). The left ventricular ejection fraction was calculated or visually estimated to be 66%. Compared to the prior study from 12/28/2011, the current study reveals new ischemic changes. This represents a high risk scan, consider further cardiac workup.  Echo 05/10/08: echocardiogram within normal limits. Impression: EKG 04/18/2014: Normal sinus rhythm at the rate of 88 bpm, normal EKG. No evidence of ischemia. No chanage from EKG 11/16/2011. Current Plans l Started Isosorbide Mononitrate ER 60MG , 1 (one) Tablet ER 24HR daily, #30, 04/18/2014, Ref. x2. Future Plans l 05/14/2014: CBC & PLATELETS (AUTO) (06237) - one time  Shortness of breath at rest (R06.02) Story: Echo- 05/01/13 Study is technically limited due to pt's body habitus. 1. Left ventricular cavity is normal in size. Moderate concentric hypertrophy. Septum appears more hypertrophied  than post. wall. Diastolic filling with impaired relaxation pattern. Normal global wall motion. Normal systolic global function. Calculated EF 57%. Doppler evidence of Grade I (impaired) diastolic dysfunction. 2. Normal trileaflet aortic valve, with normal systolic flow pattern, with no regurgitation noted. 3. Mild calcification of the mitral annulus. Mitral valve inflow A > E ratio. 4. Tricuspid valve structurally normal. Trace tricuspid regurgitation. 5. The aortic root is mildly dilated. Mildly dilated ascending aorta.  Essential hypertension, benign (I10) Story: Labs 03/26/2014: CBC normal, serum glucose 117, creatinine 0.95, CMP normal, HbA1c 6.6%, total cholesterol 123, HDL 35, LDL 58, triglycerides 152, TSH 1.510 Future Plans l 07/30/3149: METABOLIC PANEL, BASIC (76160) - one time  Encounter for preprocedural cardiovascular examination (Z01.810) Story: Scheduled for coronary angiogram Future Plans l 05/14/2014: PT (PROTHROMBIN TIME) (73710) - one time  Controlled diabetes mellitus type II without complication (G26.9) Story: Labs 03/26/2014: CBC normal, serum glucose 117, creatinine 0.95, CMP otherwise normal, HbA1c 6.6%, total cholesterol 123, HDL 35, LDL 58, triglycerides 152, TSH 1.510 Current Plans l Mechanism of underlying disease process and action of medications discussed with the patient. I discussed primary/secondary prevention and also dietary counceling was done. Patient is here for follow-up of chest pain suggestive of angina pectoris. Patient continues to have significant symptoms angina, doing even minimal activities, walking from my office to the parking lot which is only short distance away he had to sit down and rest. It is associated marked dyspnea. Due to abnormal stress test, I suspect either progression of coronary artery disease or restenosis. I'll set him up for coronary angiography and see him back after this. I have started him on isosorbide mononitrate  60 mg by mouth daily. Schedule for cardiac catheterization, and possible angioplasty. We discussed regarding risks, benefits, alternatives to this including stress testing, CTA and continued medical therapy. Patient wants to proceed. Understands <1-2% risk of death, stroke, MI, urgent CABG, bleeding, infection, renal failure but not limited to these. Video recording of the procedure shown to the patient. 04/18/2014   l Referred to [name] MD.

## 2014-06-07 NOTE — Interval H&P Note (Signed)
History and Physical Interval Note:  06/07/2014 10:16 AM  Daniel Rivers  has presented today for surgery, with the diagnosis of cad  The various methods of treatment have been discussed with the patient and family. After consideration of risks, benefits and other options for treatment, the patient has consented to  Procedure(s): Coronary Stent Intervention (N/A) as a surgical intervention .  The patient's history has been reviewed, patient examined, no change in status, stable for surgery.  I have reviewed the patient's chart and labs.  Questions were answered to the patient's satisfaction.  Patient has had coronary angiography and angioplasty to his LAD on 05/22/2014, at that time was also noted to have a very large right coronary artery which has a previously placed stent, this is occluded. The lesion appears to be amenable for angioplasty. 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  Ischemic Symptoms? CCS III (Marked limitation of ordinary activity) Anti-ischemic Medical Therapy? Maximal Medical Therapy (2 or more classes of medications) Non-invasive Test Results? No non-invasive testing performed Prior CABG? No Previous CABG   Patient Information:   1-2V CAD, no prox LAD  A (7)  Indication: 20; Score: 7   Patient Information:   1-2V-CAD with DS 50-60% With No FFR, No IVUS  I (3)  Indication: 21; Score: 3   Patient Information:   1-2V-CAD with DS 50-60% With FFR  A (7)  Indication: 22; Score: 7   Patient Information:   1-2V-CAD with DS 50-60% With FFR>0.8, IVUS not significant  I (2)  Indication: 23; Score: 2   Patient Information:   3V-CAD without LMCA With Abnormal LV systolic function  A (9)  Indication: 48; Score: 9   Patient Information:   LMCA-CAD  A (9)  Indication: 49; Score: 9   Patient Information:   2V-CAD with prox LAD PCI  A (7)  Indication: 62; Score: 7   Patient Information:   2V-CAD with prox LAD CABG  A (8)  Indication: 62;  Score: 8   Patient Information:   3V-CAD without LMCA With Low CAD burden(i.e., 3 focal stenoses, low SYNTAX score) PCI  A (7)  Indication: 63; Score: 7   Patient Information:   3V-CAD without LMCA With Low CAD burden(i.e., 3 focal stenoses, low SYNTAX score) CABG  A (9)  Indication: 63; Score: 9   Patient Information:   3V-CAD without LMCA E06c - Intermediate-high CAD burden (i.e., multiple diffuse lesions, presence of CTO, or high SYNTAX score) PCI  U (4)  Indication: 64; Score: 4   Patient Information:   3V-CAD without LMCA E06c - Intermediate-high CAD burden (i.e., multiple diffuse lesions, presence of CTO, or high SYNTAX score) CABG  A (9)  Indication: 64; Score: 9   Patient Information:   LMCA-CAD With Isolated LMCA stenosis  PCI  U (6)  Indication: 65; Score: 6   Patient Information:   LMCA-CAD With Isolated LMCA stenosis  CABG  A (9)  Indication: 65; Score: 9   Patient Information:   LMCA-CAD Additional CAD, low CAD burden (i.e., 1- to 2-vessel additional involvement, low SYNTAX score) PCI  U (5)  Indication: 66; Score: 5   Patient Information:   LMCA-CAD Additional CAD, low CAD burden (i.e., 1- to 2-vessel additional involvement, low SYNTAX score) CABG  A (9)  Indication: 66; Score: 9   Patient Information:   LMCA-CAD Additional CAD, intermediate-high CAD burden (i.e., 3-vessel involvement, presence of CTO, or high SYNTAX score) PCI  I (3)  Indication: 67; Score: 3  Patient Information:   LMCA-CAD Additional CAD, intermediate-high CAD burden (i.e., 3-vessel involvement, presence of CTO, or high SYNTAX score) CABG  A (9)  Indication: 67; Score: 9   Daniel Rivers

## 2014-06-08 DIAGNOSIS — E119 Type 2 diabetes mellitus without complications: Secondary | ICD-10-CM | POA: Diagnosis not present

## 2014-06-08 DIAGNOSIS — E785 Hyperlipidemia, unspecified: Secondary | ICD-10-CM | POA: Diagnosis not present

## 2014-06-08 DIAGNOSIS — I2510 Atherosclerotic heart disease of native coronary artery without angina pectoris: Secondary | ICD-10-CM | POA: Diagnosis not present

## 2014-06-08 DIAGNOSIS — E669 Obesity, unspecified: Secondary | ICD-10-CM | POA: Diagnosis not present

## 2014-06-08 LAB — CBC
HCT: 39.5 % (ref 39.0–52.0)
Hemoglobin: 13.5 g/dL (ref 13.0–17.0)
MCH: 29.8 pg (ref 26.0–34.0)
MCHC: 34.2 g/dL (ref 30.0–36.0)
MCV: 87.2 fL (ref 78.0–100.0)
Platelets: 106 10*3/uL — ABNORMAL LOW (ref 150–400)
RBC: 4.53 MIL/uL (ref 4.22–5.81)
RDW: 13.3 % (ref 11.5–15.5)
WBC: 5.1 10*3/uL (ref 4.0–10.5)

## 2014-06-08 LAB — BASIC METABOLIC PANEL
Anion gap: 7 (ref 5–15)
BUN: 9 mg/dL (ref 6–20)
CALCIUM: 9.3 mg/dL (ref 8.9–10.3)
CO2: 28 mmol/L (ref 22–32)
Chloride: 105 mmol/L (ref 101–111)
Creatinine, Ser: 0.87 mg/dL (ref 0.61–1.24)
GFR calc Af Amer: >60 mL/min (ref >60)
Glucose, Bld: 130 mg/dL — ABNORMAL HIGH (ref 70–99)
POTASSIUM: 4.1 mmol/L (ref 3.5–5.1)
Sodium: 140 mmol/L (ref 135–145)

## 2014-06-08 LAB — GLUCOSE, CAPILLARY: GLUCOSE-CAPILLARY: 142 mg/dL — AB (ref 70–99)

## 2014-06-08 MED ORDER — TICAGRELOR 90 MG PO TABS: 90.0000 mg | ORAL_TABLET | Freq: Two times a day (BID) | ORAL | Status: DC

## 2014-06-08 MED FILL — Heparin Sodium (Porcine) 2 Unit/ML in Sodium Chloride 0.9%: INTRAMUSCULAR | Qty: 1000 | Status: AC

## 2014-06-08 MED FILL — Lidocaine HCl Local Preservative Free (PF) Inj 1%: INTRAMUSCULAR | Qty: 30 | Status: AC

## 2014-06-08 NOTE — Progress Notes (Signed)
Met with patient to discuss discharge needs. Patient has already been given the Brilinta packet with 30 day free card by the bedside RN.  Benefits check for patient's copay is pending, but patient has declined to wait for results.  CM verified that Kary Kos is in stock at [facility] on [address], his pharmacy of choice.

## 2014-06-08 NOTE — Progress Notes (Signed)
Vearl Aitken J. Clydene Laming, RN, BSN, [employer] [phone] Spoke with pt at bedside regarding benefits check for Brilinta.  Pt has brochure with 30 day free card and refill assistance card intact.  Pt utilizes [facility] on [address] for prescription needs.  NCM called pharmacy to confirm availability of medication.  Co-pay will be $40 with no prior auth. Information relayed to pt.  Pt verbalizes importance of filling medication upon discharge.

## 2014-06-08 NOTE — Progress Notes (Addendum)
CARDIAC REHAB PHASE I   PRE:  Rate/Rhythm: 93 SR  BP:  Sitting: 145/76        SaO2: 100 RA  MODE:  Ambulation: 1000 ft   POST:  Rate/Rhythm: 104 ST  BP:  Sitting: 152/88         SaO2: 96 RA  Pt returned for staged PCI.  Pt ambulated 1000 ft on RA, standby assist, cane, mildly unsteady gait at baseline d/t hip/back pain, tolerated well.  Pt c/o of mild DOE states he has felt short of breath since this morning, denies cp, dizziness, sitting rest stop x1 d/t hip pain. Discussed new breathlessness with Dr. Einar Gip at bedside, states could be side effect of new brilinta. Reviewed stent education, anti-platelet therapy, stent card, activity restrictions, ntg, exercise, heart healthy diet, carb counting, portion control, phase 2 cardiac rehab. Gave pt brilinta card. Pt verbalized understanding. Pt states he has been exercising 30 minutes a day at home since last admission. Pt still agreeable to phase 2 cardiac rehab. Referral sent to [facility] last admission.    [phone]  Lenna Sciara, RN, BSN 06/08/2014 9:23 AM

## 2014-06-08 NOTE — Discharge Summary (Signed)
Physician Discharge Summary  Patient ID: Daniel Rivers MRN: [id number] DOB/AGE: [birth year] 64 y.o.  Admit date: 06/07/2014 Discharge date: 06/08/2014  Primary Discharge Diagnosis: 1. CAD of the native vessel S/P percutaneous coronary angioplasty  06/08/2014: Stenting of CTO, RCA, proximal to mid and mid to distal RCA up to the level of the crux was stented. 100% stenosis reduced to 0%. Distally a 3.0 x 38 mm and mid to proximal overlap with a 3.5 x 26 mm resolute integrity DES deployed. 05/22/2014: Stenting of mid LAD with 3.0 x 26 mm resolute integrity DES for a high-grade 90% stenosis reduced to 0%. H/O stenting in 2/09 Prox and Mid RCA 3.0x30 and 3.5x12 Driver non-DES; LAD mid 2.75x18 Driver non drug eluting stent. 2. Angina pectoris  Secondary Discharge Diagnosis Shortness of breath on exertion  Controlled diabetes mellitus type II   Mild hyperlipidemia.  Obesity  Significant Diagnostic Studies: 06/08/2014: Stenting of CTO, RCA, proximal to mid and mid to distal RCA up to the level of the crux was stented. 100% stenosis reduced to 0%. Distally a 3.0 x 38 mm and mid to proximal overlap with a 3.5 x 26 mm resolute integrity DES deployed.  H/O PTCA 05/22/2014:  Stenting of mid LAD with 3.0 x 26 mm resolute integrity DES.  Hospital Course: Patient admitted on elective basis for attempted PTCA of the CTO of the RCA. He underwent successful revascularization of a very large dominant RCA. The following morning he remained chest pain-free and essentially asymptomatic. Hence felt stable for discharge.  Due to multiple stents, significant cardio vascular risk factors, felt that he would benefit from [facility], hence Plavix was discontinued. Otherwise no changes in the medications were done.  Discharge Exam: Blood pressure 145/76, pulse 92, temperature 97.9 F (36.6 C), temperature source Oral, resp. rate 18, height 6\' 4"  (1.93 m), weight 112.5 kg (248 lb 0.3 oz), SpO2 95 %.    General appearance:  alert, cooperative, appears older than stated age, no distress and mildly obese Resp: clear to auscultation bilaterally Cardio: regular rate and rhythm, S1, S2 normal, no murmur, click, rub or gallop GI: soft, non-tender; bowel sounds normal; no masses,  no organomegaly Extremities: extremities normal, atraumatic, no cyanosis or edema Pulses: 2+ and symmetric Neurologic: Grossly normal Right radial arterial access site has healed well without any complications. Labs:   Lab Results  Component Value Date   WBC 5.1 06/08/2014   HGB 13.5 06/08/2014   HCT 39.5 06/08/2014   MCV 87.2 06/08/2014   PLT 106* 06/08/2014     Recent Labs Lab 06/08/14 0342  NA 140  K 4.1  CL 105  CO2 28  BUN 9  CREATININE 0.87  CALCIUM 9.3  GLUCOSE 130*     EKG 05/23/2014: Normal sinus rhythm at the rate of 71 bpm, normal axis, inferior infarct old.  LVH by voltage criteria.  Compared to prior EKG on 04/19/016, inferior T wave inversion new.   Radiology: No results found.    FOLLOW UP PLANS AND APPOINTMENTS    Medication List    STOP taking these medications        clopidogrel 75 MG tablet  Commonly known as:  PLAVIX      TAKE these medications        ACCU-CHEK FASTCLIX LANCETS Misc     AMBIEN 10 MG tablet  Generic drug:  zolpidem  Take 10 mg by mouth at bedtime as needed for sleep.     aspirin 81 MG tablet  Take  81 mg by mouth daily.     bisacodyl 5 MG EC tablet  Commonly known as:  DULCOLAX  Take 5 mg by mouth at bedtime. 2 at bedtime     buPROPion 300 MG 24 hr tablet  Commonly known as:  WELLBUTRIN XL  Take 300 mg by mouth daily.     COREG 3.125 MG tablet  Generic drug:  carvedilol  Take 3.125 mg by mouth 2 (two) times daily.     furosemide 20 MG tablet  Commonly known as:  LASIX  Take 20 mg by mouth as needed for fluid.     glimepiride 2 MG tablet  Commonly known as:  AMARYL  Take 1 mg by mouth daily before breakfast.     HYDROcodone-acetaminophen 7.5-325 MG per  tablet  Commonly known as:  NORCO  Take 1 tablet by mouth every 6 (six) hours as needed. 1 month supply     isosorbide mononitrate 60 MG 24 hr tablet  Commonly known as:  IMDUR  Take 1 tablet by mouth daily.     KRILL OIL PO  Take 1 tablet by mouth daily.     losartan 100 MG tablet  Commonly known as:  COZAAR  Take 100 mg by mouth daily.     Magnesium Oxide 400 MG Caps  Take 1 capsule by mouth daily.     METAMUCIL PO  Take 17 g/day by mouth 2 (two) times daily.     metFORMIN 1000 MG tablet  Commonly known as:  GLUCOPHAGE  Take 1,000 mg by mouth every 12 (twelve) hours.     morphine 60 MG 12 hr tablet  Commonly known as:  MS CONTIN  Take 1 tablet (60 mg total) by mouth 3 (three) times daily.     multivitamin capsule  Take 1 capsule by mouth daily. NO IRON     NITROSTAT 0.4 MG SL tablet  Generic drug:  nitroGLYCERIN  Place 0.4 mg under the tongue every 5 (five) minutes as needed for chest pain.     polyethylene glycol powder powder  Commonly known as:  GLYCOLAX/MIRALAX  Using measuring cap mix  17gm in water and drink two times daily     pravastatin 80 MG tablet  Commonly known as:  PRAVACHOL  Take 80 mg by mouth daily.     ticagrelor 90 MG Tabs tablet  Commonly known as:  BRILINTA  Take 1 tablet (90 mg total) by mouth 2 (two) times daily.     Vitamin D3 2000 UNITS Tabs  Take 1 tablet by mouth daily.       Follow-up Information    Follow up with Adrian Prows, MD.   Specialty:  Cardiology   Why:  Keep previous appointment   Contact information:   [address] [place] [address] [phone]        Adrian Prows, MD 06/08/2014, 9:03 AM  Pager: [phone] Office: [phone] If no answer: [phone]

## 2014-08-03 ENCOUNTER — Encounter: Payer: Medicare Other | Attending: Physical Medicine & Rehabilitation | Admitting: Physical Medicine & Rehabilitation

## 2014-08-03 ENCOUNTER — Encounter: Payer: Self-pay | Admitting: Physical Medicine & Rehabilitation

## 2014-08-03 VITALS — BP 114/64 | HR 83 | Resp 14

## 2014-08-03 DIAGNOSIS — M7060 Trochanteric bursitis, unspecified hip: Secondary | ICD-10-CM

## 2014-08-03 DIAGNOSIS — M459 Ankylosing spondylitis of unspecified sites in spine: Secondary | ICD-10-CM | POA: Diagnosis not present

## 2014-08-03 DIAGNOSIS — M67919 Unspecified disorder of synovium and tendon, unspecified shoulder: Secondary | ICD-10-CM | POA: Diagnosis not present

## 2014-08-03 MED ORDER — MORPHINE SULFATE ER 60 MG PO TBCR: 60.0000 mg | EXTENDED_RELEASE_TABLET | Freq: Three times a day (TID) | ORAL | Status: DC

## 2014-08-03 MED ORDER — HYDROCODONE-ACETAMINOPHEN 7.5-325 MG PO TABS: 1.0000 | ORAL_TABLET | Freq: Four times a day (QID) | ORAL | Status: DC | PRN

## 2014-08-03 NOTE — Patient Instructions (Signed)
WORK ON EXTENSION EXERCISES AND STRETCHES FOR YOUR NECK AND BACK. THE POOL WOULD BE A GREAT PLACE TO WORK ON THESE MOVEMENTS.

## 2014-08-03 NOTE — Progress Notes (Signed)
Subjective:    Patient ID: Daniel Rivers, male    DOB: [birth year], 64 y.o.   MRN: [id number]  HPI  Daniel Rivers is here in follow up of his chronic pain. He had a heart attack in the spring an ultimately stenting by Dr. Einar Rivers. He is on effient currently which is working well for him. He didn't do any cardiac rehab. He is walking twice daily and gets in his shop.   From a pain standpoint he's fairly stable. His meds continue to provide relief for him. He does feel that he's lost some of his ROM, particularly in his neck.           Pain Inventory Average Pain 6 Pain Right Now 5 My pain is constant, sharp, burning, dull, stabbing, tingling and aching  In the last 24 hours, has pain interfered with the following? General activity 5 Relation with others 3 Enjoyment of life 6 What TIME of day is your pain at its worst? daytime, evening Sleep (in general) Fair  Pain is worse with: bending, sitting, inactivity, standing and some activites Pain improves with: rest, therapy/exercise, pacing activities and medication Relief from Meds: 5  Mobility walk with assistance use a cane Do you have any goals in this area?  no  Function Do you have any goals in this area?  no  Neuro/Psych numbness tingling  Prior Studies Any changes since last visit?  yes angioplasty  Physicians involved in your care Any changes since last visit?  yes Primary care Dr. Maudie Rivers   History reviewed. No pertinent family history. History   Social History  . Marital Status: Widowed    Spouse Name: N/A  . Number of Children: N/A  . Years of Education: N/A   Social History Main Topics  . Smoking status: Former Smoker -- 5.00 packs/day for 23 years    Types: Cigarettes    Quit date: 06/16/1986  . Smokeless tobacco: Never Used  . Alcohol Use: Yes     Comment: 05/22/2014  "used to drink casually; never had problem w/it; none since ~ 2004"  . Drug Use: No  . Sexual Activity: No   Other Topics  Concern  . None   Social History Narrative   Past Surgical History  Procedure Laterality Date  . Cortisone injection    . Small intestine surgery  1960    "took out 14 inches"  . Back surgery    . Ulnar tunnel release Left   . Bone tissue      "I've had removal of excess tissue as a result of my ankylosing spondylitis"  . Anal fissure repair    . Shoulder arthroscopy w/ rotator cuff repair Bilateral   . Colon surgery    . Carpal tunnel release Bilateral   . Ankle fracture surgery Left   . Lumbar disc surgery  X 2    "ruptured/herniated/growing together"  . Fracture surgery    . Coronary angioplasty with stent placement  2009; 05/22/2014    ?3; ?1  . Left heart catheterization with coronary angiogram N/A 05/22/2014    Procedure: LEFT HEART CATHETERIZATION WITH CORONARY ANGIOGRAM;  Surgeon: Daniel Prows, MD;  Location: [facility] CATH LAB;  Service: Cardiovascular;  Laterality: N/A;  . Percutaneous coronary stent intervention (pci-s)  05/22/2014    Procedure: PERCUTANEOUS CORONARY STENT INTERVENTION (PCI-S);  Surgeon: Daniel Prows, MD;  Location: [facility] CATH LAB;  Service: Cardiovascular;;  mid LAD DES  . Cardiac catheterization N/A 06/07/2014    Procedure: Coronary Stent  Intervention;  Surgeon: Daniel Prows, MD;  Location: [facility] INVASIVE CV LAB CUPID;  Service: Cardiovascular;  Laterality: N/A;  . Percutaneous coronary stent intervention (pci-s)  06/07/2014    des to rca   Past Medical History  Diagnosis Date  . Ankylosing spondylitis of lumbar region   . Ankylosing spondylitis of cervical region   . Obesity   . Bursitis, trochanteric   . Tendonitis   . Depression   . Anxiety   . Hypertension   . High cholesterol   . Rotator cuff syndrome   . Type II diabetes mellitus   . Hemochromatosis   . GERD (gastroesophageal reflux disease)   . History of hiatal hernia   . Daily headache     "recently; related to RX for my heart" (05/21/2014)  . Osteoarthritis     "joints" (05/20/2104)  . Chronic back pain    . Chronic cervical pain   . BPH (benign prostatic hypertrophy)   . Non-alcoholic cirrhosis     "from the hemochromatosis"  . Coronary artery disease    BP 114/64 mmHg  Pulse 83  Resp 14  SpO2 97%  Opioid Risk Score:   Fall Risk Score: Moderate Fall Risk (6-13 points)`1  Depression screen PHQ 2/9  Depression screen [facility] 2/9 05/08/2014 02/09/2014 02/09/2014 11/08/2013  Decreased Interest 2 0 0 0  Down, Depressed, Hopeless 0 0 0 0  PHQ - 2 Score 2 0 0 0  Altered sleeping 0 - - -  Tired, decreased energy 3 - - -  Change in appetite 0 - - -  Feeling bad or failure about yourself  1 - - -  Trouble concentrating 1 - - -  Moving slowly or fidgety/restless 0 - - -  Suicidal thoughts 0 - - -  PHQ-9 Score 7 - - -     Review of Systems  Neurological: Positive for numbness.       Tingling   All other systems reviewed and are negative.      Objective:   Physical Exam  Constitutional: He is oriented to person, place, and time. He appears well-developed and well-nourished.  HENT:  Head: Normocephalic and atraumatic.  Nose: Nose normal.  Mouth/Throat: Oropharynx is clear and moist.  Eyes: Conjunctivae are normal. Pupils are equal, round, and reactive to light.  Cardiovascular: Normal rate and regular rhythm.  Pulmonary/Chest: Effort normal. No respiratory distress. He has no wheezes.  Abdominal: He exhibits no distension. There is no tenderness.  Musculoskeletal:  Left shoulder: He exhibits decreased range of motion, tenderness and bony tenderness.  Lumbar back: forward flexed---can extend to neutral only.  Cervical: head forward position. Cannot extend to neutral. pecs tight as are shoulders.  Neurological: He is alert and oriented to person, place, and time.  He is able to bend and flex at the waist and go to 90 degrees. cannont extend neck to neutral (-10 degrees).uses a cane for balance.  Assessment & Plan:   ASSESSMENT:  1. Ankylosing spondylitis of the cervical and lumbar  spine.  2. Left and right greater trochanter bursitis.  3. Bilateral rotator cuff syndrome.  4. Left bicipital tendonitis.  5. Hypo-testosterone state    PLAN:  1. Continue with HEP to improve shoulder and chest ROM to secondarily improve the neck ROM.[name] Discussed extension exercises, aquatic therapy as well.  2. Current medication regimen is still effective. Refilled morphine  60 mg CR q.8 hours #90 and Norco 7.5, #90 with a second rx for next month. He may  come pick up an RX for month 3.  3. DC mobic given CV issues/ use of effient 4. He will see [place] back in 3 months. All questions were encouraged and answered. Fifteen minutes of face to face patient care time were spent during this visit. All questions were encouraged and answered.

## 2014-08-17 ENCOUNTER — Other Ambulatory Visit: Payer: Medicare Other

## 2014-08-17 ENCOUNTER — Other Ambulatory Visit: Payer: Self-pay | Admitting: *Deleted

## 2014-08-20 ENCOUNTER — Telehealth: Payer: Self-pay | Admitting: Oncology

## 2014-08-20 ENCOUNTER — Ambulatory Visit (HOSPITAL_BASED_OUTPATIENT_CLINIC_OR_DEPARTMENT_OTHER): Payer: Medicare Other

## 2014-08-20 DIAGNOSIS — D696 Thrombocytopenia, unspecified: Secondary | ICD-10-CM | POA: Diagnosis not present

## 2014-08-20 DIAGNOSIS — K7460 Unspecified cirrhosis of liver: Secondary | ICD-10-CM

## 2014-08-20 DIAGNOSIS — Z862 Personal history of diseases of the blood and blood-forming organs and certain disorders involving the immune mechanism: Secondary | ICD-10-CM

## 2014-08-20 LAB — CBC WITH DIFFERENTIAL/PLATELET
BASO%: 0.8 % (ref 0.0–2.0)
BASOS ABS: 0 10*3/uL (ref 0.0–0.1)
EOS ABS: 0.1 10*3/uL (ref 0.0–0.5)
EOS%: 1.8 % (ref 0.0–7.0)
HCT: 42 % (ref 38.4–49.9)
HGB: 14.2 g/dL (ref 13.0–17.1)
LYMPH%: 31.4 % (ref 14.0–49.0)
MCH: 29.9 pg (ref 27.2–33.4)
MCHC: 33.7 g/dL (ref 32.0–36.0)
MCV: 88.7 fL (ref 79.3–98.0)
MONO#: 0.5 10*3/uL (ref 0.1–0.9)
MONO%: 10.1 % (ref 0.0–14.0)
NEUT%: 55.9 % (ref 39.0–75.0)
NEUTROS ABS: 2.7 10*3/uL (ref 1.5–6.5)
PLATELETS: 134 10*3/uL — AB (ref 140–400)
RBC: 4.74 10*6/uL (ref 4.20–5.82)
RDW: 13.5 % (ref 11.0–14.6)
WBC: 4.8 10*3/uL (ref 4.0–10.3)
lymph#: 1.5 10*3/uL (ref 0.9–3.3)

## 2014-08-20 LAB — COMPREHENSIVE METABOLIC PANEL (CC13)
ALT: 39 U/L (ref 0–55)
ANION GAP: 9 meq/L (ref 3–11)
AST: 42 U/L — ABNORMAL HIGH (ref 5–34)
Albumin: 4 g/dL (ref 3.5–5.0)
Alkaline Phosphatase: 57 U/L (ref 40–150)
BUN: 13.1 mg/dL (ref 7.0–26.0)
CHLORIDE: 104 meq/L (ref 98–109)
CO2: 25 mEq/L (ref 22–29)
Calcium: 10 mg/dL (ref 8.4–10.4)
Creatinine: 0.8 mg/dL (ref 0.7–1.3)
GLUCOSE: 101 mg/dL (ref 70–140)
Potassium: 4.3 mEq/L (ref 3.5–5.1)
Sodium: 139 mEq/L (ref 136–145)
TOTAL PROTEIN: 7.1 g/dL (ref 6.4–8.3)
Total Bilirubin: 1.17 mg/dL (ref 0.20–1.20)

## 2014-08-20 LAB — FERRITIN CHCC: FERRITIN: 93 ng/mL (ref 22–316)

## 2014-08-20 NOTE — Telephone Encounter (Signed)
Per 7/15 pof reschedule missed 7/15 lab appointment to 7/18 or 7/19 prior to f/u with FS 7/20. Spoke with patient he will come in for lab today @ 1:45pm.

## 2014-08-22 ENCOUNTER — Ambulatory Visit (HOSPITAL_BASED_OUTPATIENT_CLINIC_OR_DEPARTMENT_OTHER): Payer: Medicare Other

## 2014-08-22 ENCOUNTER — Ambulatory Visit (HOSPITAL_BASED_OUTPATIENT_CLINIC_OR_DEPARTMENT_OTHER): Payer: Medicare Other | Admitting: Oncology

## 2014-08-22 ENCOUNTER — Telehealth: Payer: Self-pay | Admitting: Oncology

## 2014-08-22 VITALS — BP 122/50 | HR 85 | Temp 97.9°F | Resp 18 | Ht 76.0 in | Wt 250.7 lb

## 2014-08-22 VITALS — BP 117/66 | HR 88 | Temp 97.9°F | Resp 17

## 2014-08-22 DIAGNOSIS — D696 Thrombocytopenia, unspecified: Secondary | ICD-10-CM | POA: Diagnosis not present

## 2014-08-22 DIAGNOSIS — Z862 Personal history of diseases of the blood and blood-forming organs and certain disorders involving the immune mechanism: Secondary | ICD-10-CM

## 2014-08-22 DIAGNOSIS — K7460 Unspecified cirrhosis of liver: Secondary | ICD-10-CM | POA: Diagnosis not present

## 2014-08-22 NOTE — Progress Notes (Signed)
Hematology and Oncology Follow Up Visit  Daniel Rivers [id number] Mar 13, 1950 64 y.o. 08/22/2014 10:12 AM Daniel Rivers, MDPang, Richard, MD   Principle Diagnosis: 64 year old with hemochromatosis and liver cirrhosis diagnosed in 2009. He established care at the [facility] in August of 2014.   Prior Therapy: He have been receiving intermittent phlebotomies in the past.  Current therapy: He have been receiving an every 3 month phlebotomy to achieve ferritin level close to 50.  Interim History:  Daniel Rivers presents today for a followup visit. Since his last visit, he underwent cardiac catheterization and found to have coronary disease that required intervention and stenting. He had 2 procedures 1 in April and one in May 2016. Since this procedures, his symptoms of dyspnea on exertion have improved. He is not reporting any chest pain or difficulty breathing.  He tolerated phlebotomies previously without any complications. He is not reported any fatigue or tiredness. He has not reported any headaches or blurry vision or alteration of his mental status. Has not had any genitourinary complaints of frequency urgency or hesitancy. Has not reported any lymphadenopathy or petechiae. Has not reported any easy bruisability or any bleeding. Report any new skeletal complaints. The rest of his review of symptoms is unremarkable.   Medications: I have reviewed the patient's current medications.  Current Outpatient Prescriptions  Medication Sig Dispense Refill  . ACCU-CHEK FASTCLIX LANCETS MISC     . aspirin 81 MG tablet Take 81 mg by mouth daily.     . bisacodyl (DULCOLAX) 5 MG EC tablet Take 5 mg by mouth at bedtime. 2 at bedtime    . buPROPion (WELLBUTRIN XL) 300 MG 24 hr tablet Take 300 mg by mouth daily.     . carvedilol (COREG) 3.125 MG tablet Take 3.125 mg by mouth 2 (two) times daily.    . Cholecalciferol (VITAMIN D3) 2000 UNITS TABS Take 1 tablet by mouth daily.    . furosemide  (LASIX) 20 MG tablet Take 20 mg by mouth as needed for fluid.     [name] glimepiride (AMARYL) 2 MG tablet Take 1 mg by mouth daily before breakfast.     . HYDROcodone-acetaminophen (NORCO) 7.5-325 MG per tablet Take 1 tablet by mouth every 6 (six) hours as needed (pain). 1 month supply 120 tablet 0  . isosorbide mononitrate (IMDUR) 60 MG 24 hr tablet Take 1 tablet by mouth daily.  1  . KRILL OIL PO Take 1 tablet by mouth daily.     [name] losartan (COZAAR) 100 MG tablet Take 100 mg by mouth daily.    . Magnesium Oxide 400 MG CAPS Take 1 capsule by mouth daily.    . metFORMIN (GLUCOPHAGE) 1000 MG tablet Take 1,000 mg by mouth every 12 (twelve) hours.    [name] morphine (MS CONTIN) 60 MG 12 hr tablet Take 1 tablet (60 mg total) by mouth 3 (three) times daily. 90 tablet 0  . Multiple Vitamin (MULTIVITAMIN) capsule Take 1 capsule by mouth daily. NO IRON    . NITROSTAT 0.4 MG SL tablet Place 0.4 mg under the tongue every 5 (five) minutes as needed for chest pain.     . polyethylene glycol powder (GLYCOLAX/MIRALAX) powder Using measuring cap mix  17gm in water and drink two times daily 3162 g 11  . prasugrel (EFFIENT) 10 MG TABS tablet Take 10 mg by mouth daily.    . pravastatin (PRAVACHOL) 80 MG tablet Take 80 mg by mouth daily.    . Psyllium (METAMUCIL PO)  Take 17 g/day by mouth 2 (two) times daily.    [name] zolpidem (AMBIEN) 10 MG tablet Take 10 mg by mouth at bedtime as needed for sleep.      No current facility-administered medications for this visit.     Allergies:  Allergies  Allergen Reactions  . Amlodipine Besy-Benazepril Hcl Cough  . Naproxen Other (See Comments)    Worsens Tinnitus    Past Medical History, Surgical history, Social history, and Family History were reviewed and updated.  [name] Physical Exam: Blood pressure 122/50, pulse 85, temperature 97.9 F (36.6 C), temperature source Oral, resp. rate 18, height 6\' 4"  (1.93 m), weight 250 lb 11.2 oz (113.717 kg), SpO2 99 %. ECOG: 1 General  appearance: alert and cooperative. Well-appearing gentleman not in any distress. Head: Normocephalic, without obvious abnormality Neck: no adenopathy Lymph nodes: Cervical, supraclavicular, and axillary nodes normal. Heart:regular rate and rhythm, S1, S2 normal, no murmur, click, rub or gallop Lung:chest clear, no wheezing, rales, normal symmetric air entry. Abdomin: soft, non-tender, without masses or organomegaly EXT:no erythema, induration, or nodules   Lab Results: Lab Results  Component Value Date   WBC 4.8 08/20/2014   HGB 14.2 08/20/2014   HCT 42.0 08/20/2014   MCV 88.7 08/20/2014   PLT 134* 08/20/2014     Chemistry      Component Value Date/Time   NA 139 08/20/2014 1401   NA 140 06/08/2014 0342   K 4.3 08/20/2014 1401   K 4.1 06/08/2014 0342   CL 105 06/08/2014 0342   CO2 25 08/20/2014 1401   CO2 28 06/08/2014 0342   BUN 13.1 08/20/2014 1401   BUN 9 06/08/2014 0342   CREATININE 0.8 08/20/2014 1401   CREATININE 0.87 06/08/2014 0342      Component Value Date/Time   CALCIUM 10.0 08/20/2014 1401   CALCIUM 9.3 06/08/2014 0342   ALKPHOS 57 08/20/2014 1401   AST 42* 08/20/2014 1401   ALT 39 08/20/2014 1401   BILITOT 1.17 08/20/2014 1401       Results for Daniel Rivers (MRN [id number]) as of 08/22/2014 09:55  Ref. Range 02/08/2014 10:30 05/16/2014 13:16 08/20/2014 14:01  Ferritin Latest Ref Range: 22-316 ng/ml 69 54 93     Impression and Plan:  64 year old gentleman with the following issues:  1. Hemachromatosis: he is currently on phlebotomy to keep his ferritin close to 50. His ferritin on 08/20/2014 is 93. The plan is to proceed with phlebotomy today and repeat ferritin in 3 months. This will be repeated to get his ferritin close to 50 which is his goal.  2. Cirrhosis of the liver: He follows up with gastroenterology regarding that. He appears to be stable  3. Thrombocytopenia: This is likely related to his cirrhosis of the liver and his counts are stable  recently.  4. Coronary artery disease: Status post catheterization and stenting. He is followed by cardiology regarding this issue.   Zola Button, MD 7/20/201610:12 AM

## 2014-08-22 NOTE — Telephone Encounter (Signed)
Gave and printd appt sched and avs for OCT

## 2014-08-22 NOTE — Progress Notes (Signed)
Pt tolerated phlebotomy well - discharged to home ambulatory

## 2014-09-03 ENCOUNTER — Other Ambulatory Visit: Payer: Self-pay | Admitting: Gastroenterology

## 2014-09-03 DIAGNOSIS — K7460 Unspecified cirrhosis of liver: Secondary | ICD-10-CM

## 2014-09-06 ENCOUNTER — Ambulatory Visit
Admission: RE | Admit: 2014-09-06 | Discharge: 2014-09-06 | Disposition: A | Discharge status: Home / Self Care | Payer: Medicare Other | Source: Ambulatory Visit | Attending: Gastroenterology | Admitting: Gastroenterology

## 2014-09-06 DIAGNOSIS — K7460 Unspecified cirrhosis of liver: Secondary | ICD-10-CM

## 2014-10-12 ENCOUNTER — Telehealth: Payer: Self-pay | Admitting: *Deleted

## 2014-10-12 DIAGNOSIS — M459 Ankylosing spondylitis of unspecified sites in spine: Secondary | ICD-10-CM

## 2014-10-12 MED ORDER — MORPHINE SULFATE ER 60 MG PO TBCR: 60.0000 mg | EXTENDED_RELEASE_TABLET | Freq: Three times a day (TID) | ORAL | Status: DC

## 2014-10-12 MED ORDER — HYDROCODONE-ACETAMINOPHEN 7.5-325 MG PO TABS: 1.0000 | ORAL_TABLET | Freq: Four times a day (QID) | ORAL | Status: DC | PRN

## 2014-10-12 NOTE — Telephone Encounter (Signed)
[name] called he is needing his 3rd month scripts for his pain medication.  I see that it is planned that way per your last note

## 2014-10-12 NOTE — Telephone Encounter (Signed)
done

## 2014-11-02 ENCOUNTER — Encounter: Payer: Self-pay | Admitting: Physical Medicine & Rehabilitation

## 2014-11-02 ENCOUNTER — Encounter: Payer: Medicare Other | Attending: Physical Medicine & Rehabilitation | Admitting: Physical Medicine & Rehabilitation

## 2014-11-02 ENCOUNTER — Other Ambulatory Visit: Payer: Self-pay | Admitting: Physical Medicine & Rehabilitation

## 2014-11-02 VITALS — BP 127/68 | HR 85 | Resp 14

## 2014-11-02 DIAGNOSIS — M67919 Unspecified disorder of synovium and tendon, unspecified shoulder: Secondary | ICD-10-CM

## 2014-11-02 DIAGNOSIS — M459 Ankylosing spondylitis of unspecified sites in spine: Secondary | ICD-10-CM

## 2014-11-02 DIAGNOSIS — M7060 Trochanteric bursitis, unspecified hip: Secondary | ICD-10-CM | POA: Diagnosis not present

## 2014-11-02 DIAGNOSIS — G894 Chronic pain syndrome: Secondary | ICD-10-CM

## 2014-11-02 DIAGNOSIS — Z79899 Other long term (current) drug therapy: Secondary | ICD-10-CM

## 2014-11-02 DIAGNOSIS — Z5181 Encounter for therapeutic drug level monitoring: Secondary | ICD-10-CM

## 2014-11-02 MED ORDER — MORPHINE SULFATE ER 60 MG PO TBCR: 60.0000 mg | EXTENDED_RELEASE_TABLET | Freq: Three times a day (TID) | ORAL | Status: DC

## 2014-11-02 MED ORDER — HYDROCODONE-ACETAMINOPHEN 7.5-325 MG PO TABS: 1.0000 | ORAL_TABLET | Freq: Four times a day (QID) | ORAL | Status: DC | PRN

## 2014-11-02 NOTE — Patient Instructions (Signed)
PLEASE CALL ME WITH ANY PROBLEMS OR QUESTIONS (#[phone]).  HAVE A GOOD DAY!    

## 2014-11-02 NOTE — Progress Notes (Signed)
Subjective:    Patient ID: Daniel Rivers, male    DOB: 10-06-1950, 64 y.o.   MRN: [id number]  HPI  Daniel Rivers is here in follow up of his chronic pain. His pain has been fairly controlled. He stays active around his house. He likes to work in his shop still. He is walking a mile to 1.5 miles per day.    He hasn't had any changes from a CV standpoint.   He continues on his ms contin and norco as prescribed.   Pain Inventory Average Pain 6 Pain Right Now 4 My pain is constant, sharp, burning, dull, stabbing, tingling and aching  In the last 24 hours, has pain interfered with the following? General activity 5 Relation with others 3 Enjoyment of life 6 What TIME of day is your pain at its worst? daytime Sleep (in general) Fair  Pain is worse with: walking, bending, standing and some activites Pain improves with: rest, therapy/exercise, pacing activities and medication Relief from Meds: 8  Mobility Do you have any goals in this area?  no  Function Do you have any goals in this area?  no  Neuro/Psych weakness numbness tingling  Prior Studies Any changes since last visit?  no  Physicians involved in your care Any changes since last visit?  no   History reviewed. No pertinent family history. Social History   Social History  . Marital Status: Widowed    Spouse Name: N/A  . Number of Children: N/A  . Years of Education: N/A   Social History Main Topics  . Smoking status: Former Smoker -- 5.00 packs/day for 23 years    Types: Cigarettes    Quit date: 06/16/1986  . Smokeless tobacco: Never Used  . Alcohol Use: Yes     Comment: 05/22/2014  "used to drink casually; never had problem w/it; none since ~ 2004"  . Drug Use: No  . Sexual Activity: No   Other Topics Concern  . None   Social History Narrative   Past Surgical History  Procedure Laterality Date  . Cortisone injection    . Small intestine surgery  1960    "took out 14 inches"  . Back surgery      . Ulnar tunnel release Left   . Bone tissue      "I've had removal of excess tissue as a result of my ankylosing spondylitis"  . Anal fissure repair    . Shoulder arthroscopy w/ rotator cuff repair Bilateral   . Colon surgery    . Carpal tunnel release Bilateral   . Ankle fracture surgery Left   . Lumbar disc surgery  X 2    "ruptured/herniated/growing together"  . Fracture surgery    . Coronary angioplasty with stent placement  2009; 05/22/2014    ?3; ?1  . Left heart catheterization with coronary angiogram N/A 05/22/2014    Procedure: LEFT HEART CATHETERIZATION WITH CORONARY ANGIOGRAM;  Surgeon: Adrian Prows, MD;  Location: [facility] CATH LAB;  Service: Cardiovascular;  Laterality: N/A;  . Percutaneous coronary stent intervention (pci-s)  05/22/2014    Procedure: PERCUTANEOUS CORONARY STENT INTERVENTION (PCI-S);  Surgeon: Adrian Prows, MD;  Location: [facility] CATH LAB;  Service: Cardiovascular;;  mid LAD DES  . Cardiac catheterization N/A 06/07/2014    Procedure: Coronary Stent Intervention;  Surgeon: Adrian Prows, MD;  Location: [facility] INVASIVE CV LAB CUPID;  Service: Cardiovascular;  Laterality: N/A;  . Percutaneous coronary stent intervention (pci-s)  06/07/2014    des to rca  Past Medical History  Diagnosis Date  . Ankylosing spondylitis of lumbar region   . Ankylosing spondylitis of cervical region   . Obesity   . Bursitis, trochanteric   . Tendonitis   . Depression   . Anxiety   . Hypertension   . High cholesterol   . Rotator cuff syndrome   . Type II diabetes mellitus   . Hemochromatosis   . GERD (gastroesophageal reflux disease)   . History of hiatal hernia   . Daily headache     "recently; related to RX for my heart" (05/21/2014)  . Osteoarthritis     "joints" (05/20/2104)  . Chronic back pain   . Chronic cervical pain   . BPH (benign prostatic hypertrophy)   . Non-alcoholic cirrhosis     "from the hemochromatosis"  . Coronary artery disease    BP 127/68 mmHg  Pulse 85  Resp 14  SpO2  98%  Opioid Risk Score:   Fall Risk Score:  `1  Depression screen PHQ 2/9  Depression screen [facility] 2/9 05/08/2014 02/09/2014 02/09/2014 11/08/2013  Decreased Interest 2 0 0 0  Down, Depressed, Hopeless 0 0 0 0  PHQ - 2 Score 2 0 0 0  Altered sleeping 0 - - -  Tired, decreased energy 3 - - -  Change in appetite 0 - - -  Feeling bad or failure about yourself  1 - - -  Trouble concentrating 1 - - -  Moving slowly or fidgety/restless 0 - - -  Suicidal thoughts 0 - - -  PHQ-9 Score 7 - - -     Review of Systems  Neurological: Positive for weakness and numbness.       Tingling   All other systems reviewed and are negative.      Objective:   Physical Exam  Constitutional: He is oriented to person, place, and time. He appears well-developed and well-nourished.  HENT:  Head: Normocephalic and atraumatic.  Nose: Nose normal.  Mouth/Throat: Oropharynx is clear and moist.  Eyes: Conjunctivae are normal. Pupils are equal, round, and reactive to light.  Cardiovascular: Normal rate and regular rhythm.  Pulmonary/Chest: Effort normal. No respiratory distress. He has no wheezes.  Abdominal: He exhibits no distension. There is no tenderness.  Musculoskeletal:  Left shoulder: He exhibits decreased range of motion, tenderness and bony tenderness.  Lumbar back: forward flexed---can extend to neutral only.  Cervical: head forward position. Cannot extend to neutral. pecs tight as are shoulders.  Neurological: He is alert and oriented to person, place, and time.  He is able to bend and flex at the waist and go to 90 degrees. cannont extend neck to neutral (-10 degrees).uses a cane for balance.   Assessment & Plan:   ASSESSMENT:  1. 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  2. Left and right greater trochanter bursitis.  3. Bilateral rotator cuff syndrome.  4. Left bicipital tendonitis.  5. Hypo-testosterone state    PLAN:  1. Continue with HEP to improve shoulder and chest  ROM to secondarily improve the neck ROM.[name] Discussed extension exercises, aquatic therapy as well.  2. Current medication regimen is still effective. Refilled morphine  60 mg CR q.8 hours #90 and Norco 7.5, #90 with a second rx for next month. He may come pick up an RX for month 3.  3. Effient per cardiology  4. He will follow up in 3 months. All questions were encouraged and answered. Fifteen minutes of face to face patient care  time were spent during this visit. All questions were encouraged and answered.

## 2014-11-02 NOTE — Addendum Note (Signed)
Addended by: Geryl Rankins D on: 11/02/2014 02:14 PM   Modules accepted: Orders

## 2014-11-03 LAB — PMP ALCOHOL METABOLITE (ETG): Ethyl Glucuronide (EtG): NEGATIVE ng/mL

## 2014-11-08 LAB — OPIATES/OPIOIDS (LC/MS-MS)
Codeine Urine: NEGATIVE ng/mL (ref <50)
HYDROCODONE: 205 ng/mL (ref <50)
Hydromorphone: 269 ng/mL (ref <50)
NORHYDROCODONE, UR: 597 ng/mL (ref <50)
NOROXYCODONE, UR: NEGATIVE ng/mL (ref <50)
Oxycodone, ur: NEGATIVE ng/mL (ref <50)
Oxymorphone: NEGATIVE ng/mL (ref <50)

## 2014-11-08 LAB — PRESCRIPTION MONITORING PROFILE (SOLSTAS)
AMPHETAMINE/METH: NEGATIVE ng/mL
BUPRENORPHINE, URINE: NEGATIVE ng/mL
Barbiturate Screen, Urine: NEGATIVE ng/mL
Benzodiazepine Screen, Urine: NEGATIVE ng/mL
Cannabinoid Scrn, Ur: NEGATIVE ng/mL
Carisoprodol, Urine: NEGATIVE ng/mL
Cocaine Metabolites: NEGATIVE ng/mL
Creatinine, Urine: 60.04 mg/dL (ref >20.0)
ECSTASY: NEGATIVE ng/mL
Fentanyl, Ur: NEGATIVE ng/mL
Meperidine, Ur: NEGATIVE ng/mL
Methadone Screen, Urine: NEGATIVE ng/mL
NITRITES URINE, INITIAL: NEGATIVE ug/mL
Oxycodone Screen, Ur: NEGATIVE ng/mL
Propoxyphene: NEGATIVE ng/mL
TRAMADOL UR: NEGATIVE ng/mL
Tapentadol, urine: NEGATIVE ng/mL
pH, Initial: 7.4 pH (ref 4.5–8.9)

## 2014-11-08 LAB — ZOLPIDEM (LC/MS-MS), URINE
ZOLPIDEM (GC/LC/MS), UR CONFIRM: NEGATIVE ng/mL (ref <5)
Zolpidem metabolite (GC/LC/MS) Ur, confirm: 3220 ng/mL — AB (ref <5)

## 2014-11-16 ENCOUNTER — Other Ambulatory Visit: Payer: Self-pay | Admitting: Physical Medicine & Rehabilitation

## 2014-11-21 ENCOUNTER — Other Ambulatory Visit (HOSPITAL_BASED_OUTPATIENT_CLINIC_OR_DEPARTMENT_OTHER): Payer: Medicare Other

## 2014-11-21 DIAGNOSIS — Z862 Personal history of diseases of the blood and blood-forming organs and certain disorders involving the immune mechanism: Secondary | ICD-10-CM

## 2014-11-21 DIAGNOSIS — D696 Thrombocytopenia, unspecified: Secondary | ICD-10-CM

## 2014-11-21 LAB — CBC WITH DIFFERENTIAL/PLATELET
BASO%: 0.2 % (ref 0.0–2.0)
Basophils Absolute: 0 10*3/uL (ref 0.0–0.1)
EOS%: 1.6 % (ref 0.0–7.0)
Eosinophils Absolute: 0.1 10*3/uL (ref 0.0–0.5)
HEMATOCRIT: 40.4 % (ref 38.4–49.9)
HEMOGLOBIN: 13.6 g/dL (ref 13.0–17.1)
LYMPH%: 31.1 % (ref 14.0–49.0)
MCH: 29.9 pg (ref 27.2–33.4)
MCHC: 33.7 g/dL (ref 32.0–36.0)
MCV: 88.8 fL (ref 79.3–98.0)
MONO#: 0.4 10*3/uL (ref 0.1–0.9)
MONO%: 9.5 % (ref 0.0–14.0)
NEUT%: 57.6 % (ref 39.0–75.0)
NEUTROS ABS: 2.5 10*3/uL (ref 1.5–6.5)
PLATELETS: 119 10*3/uL — AB (ref 140–400)
RBC: 4.55 10*6/uL (ref 4.20–5.82)
RDW: 13.1 % (ref 11.0–14.6)
WBC: 4.4 10*3/uL (ref 4.0–10.3)
lymph#: 1.4 10*3/uL (ref 0.9–3.3)

## 2014-11-21 LAB — COMPREHENSIVE METABOLIC PANEL (CC13)
ALT: 47 U/L (ref 0–55)
AST: 47 U/L — ABNORMAL HIGH (ref 5–34)
Albumin: 3.8 g/dL (ref 3.5–5.0)
Alkaline Phosphatase: 62 U/L (ref 40–150)
Anion Gap: 8 mEq/L (ref 3–11)
BUN: 15.7 mg/dL (ref 7.0–26.0)
CALCIUM: 9.7 mg/dL (ref 8.4–10.4)
CO2: 27 meq/L (ref 22–29)
CREATININE: 0.9 mg/dL (ref 0.7–1.3)
Chloride: 103 mEq/L (ref 98–109)
EGFR: >90 mL/min/{1.73_m2} (ref >90)
Glucose: 172 mg/dl — ABNORMAL HIGH (ref 70–140)
Potassium: 4.1 mEq/L (ref 3.5–5.1)
Sodium: 138 mEq/L (ref 136–145)
TOTAL PROTEIN: 7.1 g/dL (ref 6.4–8.3)
Total Bilirubin: 1.29 mg/dL — ABNORMAL HIGH (ref 0.20–1.20)

## 2014-11-21 LAB — FERRITIN CHCC: Ferritin: 67 ng/ml (ref 22–316)

## 2014-11-28 ENCOUNTER — Ambulatory Visit (HOSPITAL_BASED_OUTPATIENT_CLINIC_OR_DEPARTMENT_OTHER): Payer: Medicare Other | Admitting: Oncology

## 2014-11-28 ENCOUNTER — Ambulatory Visit (HOSPITAL_BASED_OUTPATIENT_CLINIC_OR_DEPARTMENT_OTHER): Payer: Medicare Other

## 2014-11-28 ENCOUNTER — Telehealth: Payer: Self-pay | Admitting: Oncology

## 2014-11-28 VITALS — BP 121/68 | HR 81 | Temp 97.5°F | Resp 18 | Ht 76.0 in | Wt 254.8 lb

## 2014-11-28 DIAGNOSIS — D696 Thrombocytopenia, unspecified: Secondary | ICD-10-CM

## 2014-11-28 DIAGNOSIS — Z862 Personal history of diseases of the blood and blood-forming organs and certain disorders involving the immune mechanism: Secondary | ICD-10-CM

## 2014-11-28 DIAGNOSIS — K7460 Unspecified cirrhosis of liver: Secondary | ICD-10-CM

## 2014-11-28 DIAGNOSIS — D45 Polycythemia vera: Secondary | ICD-10-CM

## 2014-11-28 NOTE — Progress Notes (Signed)
Hematology and Oncology Follow Up Visit  OMEED OSUNA [id number] Sep 21, 1950 64 y.o. 11/28/2014 10:24 AM [name], MDPang, Richard, MD   Principle Diagnosis: 64 year old with hemochromatosis and liver cirrhosis diagnosed in 2009. He established care at the [facility] in August of 2014.  Prior Therapy: He have been receiving intermittent phlebotomies in the past.  Current therapy: He have been receiving an every 3 month phlebotomy to achieve ferritin level close to 50.  Interim History:  Mr. Coudriet presents today for a followup visit. Since his last visit, he continues to be relatively well without any new complications. He does have chronic pain related to his arthritis which have not changed in nature. He is not reporting any bleeding or thrombosis episodes. Did not have any issues with his last phlebotomy 3 months ago.  He is not reporting any chest pain or difficulty breathing. He does not report any new cardiac complications.  He has not reported any headaches or blurry vision or alteration of his mental status. Has not had any genitourinary complaints of frequency urgency or hesitancy. Has not reported any lymphadenopathy or petechiae. Has not reported any easy bruisability or any bleeding. Report any new skeletal complaints. The rest of his review of symptoms is unremarkable.   Medications: I have reviewed the patient's current medications.  Current Outpatient Prescriptions  Medication Sig Dispense Refill  . ACCU-CHEK FASTCLIX LANCETS MISC     . aspirin 81 MG tablet Take 81 mg by mouth daily.     . bisacodyl (DULCOLAX) 5 MG EC tablet Take 5 mg by mouth at bedtime. 2 at bedtime    . carvedilol (COREG) 3.125 MG tablet Take 3.125 mg by mouth 2 (two) times daily.    . Cholecalciferol (VITAMIN D3) 2000 UNITS TABS Take 1 tablet by mouth daily.    . furosemide (LASIX) 20 MG tablet Take 20 mg by mouth as needed for fluid.     [name] glimepiride (AMARYL) 2 MG tablet Take 1 mg by  mouth daily before breakfast.     . HYDROcodone-acetaminophen (NORCO) 7.5-325 MG tablet Take 1 tablet by mouth every 6 (six) hours as needed (pain). 1 month supply 120 tablet 0  . isosorbide mononitrate (IMDUR) 60 MG 24 hr tablet Take 1 tablet by mouth daily.  1  . KRILL OIL PO Take 1 tablet by mouth daily.     [name] losartan (COZAAR) 100 MG tablet Take 100 mg by mouth daily.    . Magnesium Oxide 400 MG CAPS Take 1 capsule by mouth daily.    . metFORMIN (GLUCOPHAGE) 1000 MG tablet Take 1,000 mg by mouth every 12 (twelve) hours.    [name] morphine (MS CONTIN) 60 MG 12 hr tablet Take 1 tablet (60 mg total) by mouth 3 (three) times daily. 90 tablet 0  . Multiple Vitamin (MULTIVITAMIN) capsule Take 1 capsule by mouth daily. NO IRON    . NITROSTAT 0.4 MG SL tablet Place 0.4 mg under the tongue every 5 (five) minutes as needed for chest pain.     . polyethylene glycol powder (GLYCOLAX/MIRALAX) powder Using measuring cap mix  17gm in water and drink two times daily 3162 g 11  . prasugrel (EFFIENT) 10 MG TABS tablet Take 10 mg by mouth daily.    . pravastatin (PRAVACHOL) 80 MG tablet Take 80 mg by mouth daily.    . Psyllium (METAMUCIL PO) Take 17 g/day by mouth 2 (two) times daily.    [name] zolpidem (AMBIEN) 10 MG tablet Take  10 mg by mouth at bedtime as needed for sleep.      No current facility-administered medications for this visit.     Allergies:  Allergies  Allergen Reactions  . Amlodipine Besy-Benazepril Hcl Cough  . Naproxen Other (See Comments)    Worsens Tinnitus    Past Medical History, Surgical history, Social history, and Family History were reviewed and updated.  [name] Physical Exam: Blood pressure 121/68, pulse 81, temperature 97.5 F (36.4 C), temperature source Oral, resp. rate 18, height 6\' 4"  (1.93 m), weight 254 lb 12.8 oz (115.577 kg), SpO2 99 %. ECOG: 1 General appearance: alert and cooperative. Not in any distress. Head: Normocephalic, without obvious abnormality no oral ulcers or  lesions. Neck: no adenopathy Lymph nodes: Cervical, supraclavicular, and axillary nodes normal. Heart:regular rate and rhythm, S1, S2 normal, no murmur, click, rub or gallop Lung:chest clear, no wheezing, rales, normal symmetric air entry. Abdomin: soft, non-tender, without masses or organomegaly shifting dullness or ascites. EXT:no erythema, induration, or nodules   Lab Results: Lab Results  Component Value Date   WBC 4.4 11/21/2014   HGB 13.6 11/21/2014   HCT 40.4 11/21/2014   MCV 88.8 11/21/2014   PLT 119* 11/21/2014     Chemistry      Component Value Date/Time   NA 138 11/21/2014 1126   NA 140 06/08/2014 0342   K 4.1 11/21/2014 1126   K 4.1 06/08/2014 0342   CL 105 06/08/2014 0342   CO2 27 11/21/2014 1126   CO2 28 06/08/2014 0342   BUN 15.7 11/21/2014 1126   BUN 9 06/08/2014 0342   CREATININE 0.9 11/21/2014 1126   CREATININE 0.87 06/08/2014 0342      Component Value Date/Time   CALCIUM 9.7 11/21/2014 1126   CALCIUM 9.3 06/08/2014 0342   ALKPHOS 62 11/21/2014 1126   AST 47* 11/21/2014 1126   ALT 47 11/21/2014 1126   BILITOT 1.29* 11/21/2014 1126      Results for BURNIE, THERIEN (MRN [id number]) as of 11/28/2014 10:26  Ref. Range 08/20/2014 14:01 11/21/2014 11:25  Ferritin Latest Ref Range: 22-316 ng/ml 93 67       Impression and Plan:  64 year old gentleman with the following issues:  1. Hemachromatosis: he is currently on phlebotomy to keep his ferritin close to 50. His ferritin on 11/21/2014 was reviewed and certainly improved to 67 from 93. The goal is to get his ferritin close to 50 and we'll proceed with phlebotomy. Risks and benefits of this procedure was reviewed again and is agreeable to proceed. We'll continue to monitor his ferritin every 3 months and proceed with phlebotomy as needed.  2. Cirrhosis of the liver: He follows up with gastroenterology regarding that. He appears to be stable  3. Thrombocytopenia: This is likely related to his  cirrhosis of the liver and his counts are stable recently.  4. Coronary artery disease: Status post catheterization and stenting. He is followed by cardiology regarding this issue.  5. Follow-up: Will be in 3 months for possible repeat phlebotomy.   OACZYS,AYTKZ, MD 10/26/201610:24 AM

## 2014-11-28 NOTE — Patient Instructions (Signed)

## 2014-11-28 NOTE — Telephone Encounter (Signed)
Gave adn printd appt sched and avs for pt for Jan and Feb

## 2015-01-09 ENCOUNTER — Other Ambulatory Visit: Payer: Self-pay

## 2015-01-09 DIAGNOSIS — M459 Ankylosing spondylitis of unspecified sites in spine: Secondary | ICD-10-CM

## 2015-01-09 NOTE — Telephone Encounter (Signed)
Patient called in and stated that he needs to get a refill of his pain medication. He states that he will be out of medication on Saturday. He states that he was told to call back for his 3rd month of his Rx. Please Advise.

## 2015-01-10 MED ORDER — HYDROCODONE-ACETAMINOPHEN 7.5-325 MG PO TABS: 1.0000 | ORAL_TABLET | Freq: Four times a day (QID) | ORAL | Status: DC | PRN

## 2015-01-10 MED ORDER — MORPHINE SULFATE ER 60 MG PO TBCR: 60.0000 mg | EXTENDED_RELEASE_TABLET | Freq: Three times a day (TID) | ORAL | Status: DC

## 2015-01-10 NOTE — Telephone Encounter (Signed)
Daniel Rivers called worried about getting his Rx before the weekend. Last fill dates were 12/13/14. I will have [name] sign Rxs.  Follow up appt is 01/23/15. Daniel Huyett notified.

## 2015-01-23 ENCOUNTER — Encounter: Payer: Self-pay | Admitting: Physical Medicine & Rehabilitation

## 2015-01-23 ENCOUNTER — Encounter: Payer: Medicare Other | Attending: Physical Medicine & Rehabilitation | Admitting: Physical Medicine & Rehabilitation

## 2015-01-23 VITALS — BP 133/77 | HR 90

## 2015-01-23 DIAGNOSIS — M7060 Trochanteric bursitis, unspecified hip: Secondary | ICD-10-CM | POA: Diagnosis not present

## 2015-01-23 DIAGNOSIS — M67919 Unspecified disorder of synovium and tendon, unspecified shoulder: Secondary | ICD-10-CM | POA: Diagnosis not present

## 2015-01-23 DIAGNOSIS — M459 Ankylosing spondylitis of unspecified sites in spine: Secondary | ICD-10-CM

## 2015-01-23 MED ORDER — HYDROCODONE-ACETAMINOPHEN 7.5-325 MG PO TABS: 1.0000 | ORAL_TABLET | Freq: Four times a day (QID) | ORAL | Status: DC | PRN

## 2015-01-23 MED ORDER — MORPHINE SULFATE ER 60 MG PO TBCR: 60.0000 mg | EXTENDED_RELEASE_TABLET | Freq: Three times a day (TID) | ORAL | Status: DC

## 2015-01-23 NOTE — Progress Notes (Signed)
Subjective:    Patient ID: Daniel Rivers, male    DOB: 1950-06-10, 64 y.o.   MRN: [id number]  HPI   Daniel Rivers is here in follow up of his chronic pain. He has been back to walking more---up to a mile almost daily. He is doing stretches daily. He's dropped a little more weight since I saw him last. He is a little more cold intolerant as a result!!  He adopted some cats which have been an added stress for him.   He continues on Ms Contin 60mg  Q12 and norco 7.5mg  per our directions. He is compliant with the CSA.     Pain Inventory Average Pain 6 Pain Right Now 6 My pain is constant, sharp, burning, dull, stabbing and aching  In the last 24 hours, has pain interfered with the following? General activity 4 Relation with others 3 Enjoyment of life 5 What TIME of day is your pain at its worst? daytime Sleep (in general) Fair  Pain is worse with: walking, bending, sitting, inactivity and standing Pain improves with: rest, heat/ice, pacing activities and medication Relief from Meds: NA  Mobility Do you have any goals in this area?  no  Function Do you have any goals in this area?  no  Neuro/Psych weakness numbness trouble walking  Prior Studies Any changes since last visit?  no  Physicians involved in your care Any changes since last visit?  no   History reviewed. No pertinent family history. Social History   Social History  . Marital Status: Widowed    Spouse Name: N/A  . Number of Children: N/A  . Years of Education: N/A   Social History Main Topics  . Smoking status: Former Smoker -- 5.00 packs/day for 23 years    Types: Cigarettes    Quit date: 06/16/1986  . Smokeless tobacco: Never Used  . Alcohol Use: Yes     Comment: 05/22/2014  "used to drink casually; never had problem w/it; none since ~ 2004"  . Drug Use: No  . Sexual Activity: No   Other Topics Concern  . None   Social History Narrative   Past Surgical History  Procedure Laterality Date  .  Cortisone injection    . Small intestine surgery  1960    "took out 14 inches"  . Back surgery    . Ulnar tunnel release Left   . Bone tissue      "I've had removal of excess tissue as a result of my ankylosing spondylitis"  . Anal fissure repair    . Shoulder arthroscopy w/ rotator cuff repair Bilateral   . Colon surgery    . Carpal tunnel release Bilateral   . Ankle fracture surgery Left   . Lumbar disc surgery  X 2    "ruptured/herniated/growing together"  . Fracture surgery    . Coronary angioplasty with stent placement  2009; 05/22/2014    ?3; ?1  . Left heart catheterization with coronary angiogram N/A 05/22/2014    Procedure: LEFT HEART CATHETERIZATION WITH CORONARY ANGIOGRAM;  Surgeon: Daniel Prows, MD;  Location: [facility] CATH LAB;  Service: Cardiovascular;  Laterality: N/A;  . Percutaneous coronary stent intervention (pci-s)  05/22/2014    Procedure: PERCUTANEOUS CORONARY STENT INTERVENTION (PCI-S);  Surgeon: Daniel Prows, MD;  Location: [facility] CATH LAB;  Service: Cardiovascular;;  mid LAD DES  . Cardiac catheterization N/A 06/07/2014    Procedure: Coronary Stent Intervention;  Surgeon: Daniel Prows, MD;  Location: [place] CV LAB CUPID;  Service: Cardiovascular;  Laterality: N/A;  . Percutaneous coronary stent intervention (pci-s)  06/07/2014    des to rca   Past Medical History  Diagnosis Date  . Ankylosing spondylitis of lumbar region ([place])   . Ankylosing spondylitis of cervical region ([place])   . Obesity   . Bursitis, trochanteric   . Tendonitis   . Depression   . Anxiety   . Hypertension   . High cholesterol   . Rotator cuff syndrome   . Type II diabetes mellitus ([place])   . Hemochromatosis   . GERD (gastroesophageal reflux disease)   . History of hiatal hernia   . Daily headache     "recently; related to RX for my heart" (05/21/2014)  . Osteoarthritis     "joints" (05/20/2104)  . Chronic back pain   . Chronic cervical pain   . BPH (benign prostatic hypertrophy)   . Non-alcoholic  cirrhosis ([place])     "from the hemochromatosis"  . Coronary artery disease    BP 133/77 mmHg  Pulse 90  SpO2 98%  Opioid Risk Score:   Fall Risk Score:  `1  Depression screen PHQ 2/9  Depression screen [facility] 2/9 05/08/2014 02/09/2014 02/09/2014 11/08/2013  Decreased Interest 2 0 0 0  Down, Depressed, Hopeless 0 0 0 0  PHQ - 2 Score 2 0 0 0  Altered sleeping 0 - - -  Tired, decreased energy 3 - - -  Change in appetite 0 - - -  Feeling bad or failure about yourself  1 - - -  Trouble concentrating 1 - - -  Moving slowly or fidgety/restless 0 - - -  Suicidal thoughts 0 - - -  PHQ-9 Score 7 - - -      Review of Systems  Musculoskeletal: Positive for gait problem.  Neurological: Positive for weakness and numbness.  All other systems reviewed and are negative.      Objective:   Physical Exam  Constitutional: He is oriented to person, place, and time. He appears well-developed and well-nourished. He has lost a little weight.  HENT:  Head: Normocephalic and atraumatic.  Nose: Nose normal.  Mouth/Throat: Oropharynx is clear and moist.  Eyes: Conjunctivae are normal. Pupils are equal, round, and reactive to light.  Cardiovascular: Normal rate and regular rhythm.  Pulmonary/Chest: Effort normal. No respiratory distress. He has no wheezes.  Abdominal: He exhibits no distension. There is no tenderness.  Musculoskeletal:  Left shoulder: He exhibits consistently decreased range of motion, tenderness and bony tenderness.  Lumbar back: forward flexed---can extend to neutral only.  Cervical: head forward position. Cannot extend to neutral. pecs tight as are shoulders.  Neurological: He is alert and oriented to person, place, and time.  He is able to bend and flex at the waist and go to 90 degrees. Still cannont extend neck to neutral (-10 degrees).uses a cane for balance with good control..   Assessment & Plan:   ASSESSMENT:  1. 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    2. Left and right greater trochanter bursitis.  3. Bilateral rotator cuff syndrome.  4. Left bicipital tendonitis.  5. Hypo-testosterone state    PLAN:  1. Continue with HEP to improve shoulder and chest ROM to secondarily improve the neck ROM..   -he is doing fairly well with this at the moment  2. Current medication regimen is still effective. Refilled morphine  60 mg CR q.8 hours #90 and Norco 7.5, #90 with a second rx for next month. He may  come pick up an RX for month 3.  3. Effient per cardiology  4. He will follow up in 3 months. All questions were encouraged and answered. Fifteen minutes of face to face patient care time were spent during this visit. All questions were encouraged and answered.

## 2015-02-28 ENCOUNTER — Other Ambulatory Visit (HOSPITAL_BASED_OUTPATIENT_CLINIC_OR_DEPARTMENT_OTHER): Payer: Medicare Other

## 2015-02-28 DIAGNOSIS — Z862 Personal history of diseases of the blood and blood-forming organs and certain disorders involving the immune mechanism: Secondary | ICD-10-CM

## 2015-02-28 DIAGNOSIS — D45 Polycythemia vera: Secondary | ICD-10-CM

## 2015-02-28 LAB — CBC WITH DIFFERENTIAL/PLATELET
BASO%: 1 % (ref 0.0–2.0)
Basophils Absolute: 0 10e3/uL (ref 0.0–0.1)
EOS%: 1.5 % (ref 0.0–7.0)
Eosinophils Absolute: 0.1 10e3/uL (ref 0.0–0.5)
HCT: 42.5 % (ref 38.4–49.9)
HGB: 14 g/dL (ref 13.0–17.1)
LYMPH%: 29.1 % (ref 14.0–49.0)
MCH: 28.9 pg (ref 27.2–33.4)
MCHC: 33 g/dL (ref 32.0–36.0)
MCV: 87.5 fL (ref 79.3–98.0)
MONO#: 0.4 10e3/uL (ref 0.1–0.9)
MONO%: 10.1 % (ref 0.0–14.0)
NEUT#: 2.4 10e3/uL (ref 1.5–6.5)
NEUT%: 58.3 % (ref 39.0–75.0)
Platelets: 121 10e3/uL — ABNORMAL LOW (ref 140–400)
RBC: 4.86 10e6/uL (ref 4.20–5.82)
RDW: 14.1 % (ref 11.0–14.6)
WBC: 4.2 10e3/uL (ref 4.0–10.3)
lymph#: 1.2 10e3/uL (ref 0.9–3.3)

## 2015-02-28 LAB — COMPREHENSIVE METABOLIC PANEL WITH GFR
ALT: 44 U/L (ref 0–55)
AST: 50 U/L — ABNORMAL HIGH (ref 5–34)
Albumin: 3.7 g/dL (ref 3.5–5.0)
Alkaline Phosphatase: 65 U/L (ref 40–150)
Anion Gap: 9 meq/L (ref 3–11)
BUN: 12 mg/dL (ref 7.0–26.0)
CO2: 25 meq/L (ref 22–29)
Calcium: 9.5 mg/dL (ref 8.4–10.4)
Chloride: 104 meq/L (ref 98–109)
Creatinine: 0.9 mg/dL (ref 0.7–1.3)
EGFR: >90 ml/min/1.73 m2
Glucose: 141 mg/dL — ABNORMAL HIGH (ref 70–140)
Potassium: 4.1 meq/L (ref 3.5–5.1)
Sodium: 138 meq/L (ref 136–145)
Total Bilirubin: 1.26 mg/dL — ABNORMAL HIGH (ref 0.20–1.20)
Total Protein: 7.6 g/dL (ref 6.4–8.3)

## 2015-02-28 LAB — FERRITIN: Ferritin: 67 ng/mL (ref 22–316)

## 2015-03-06 DIAGNOSIS — K7460 Unspecified cirrhosis of liver: Secondary | ICD-10-CM | POA: Diagnosis not present

## 2015-03-07 ENCOUNTER — Ambulatory Visit (HOSPITAL_BASED_OUTPATIENT_CLINIC_OR_DEPARTMENT_OTHER): Payer: Medicare Other | Admitting: Oncology

## 2015-03-07 ENCOUNTER — Telehealth: Payer: Self-pay | Admitting: Oncology

## 2015-03-07 ENCOUNTER — Telehealth: Payer: Self-pay | Admitting: *Deleted

## 2015-03-07 ENCOUNTER — Ambulatory Visit (HOSPITAL_BASED_OUTPATIENT_CLINIC_OR_DEPARTMENT_OTHER): Payer: Medicare Other

## 2015-03-07 VITALS — BP 124/72 | HR 83 | Temp 98.0°F | Resp 16 | Ht 76.0 in | Wt 257.7 lb

## 2015-03-07 DIAGNOSIS — Z862 Personal history of diseases of the blood and blood-forming organs and certain disorders involving the immune mechanism: Secondary | ICD-10-CM

## 2015-03-07 DIAGNOSIS — D696 Thrombocytopenia, unspecified: Secondary | ICD-10-CM

## 2015-03-07 DIAGNOSIS — K7460 Unspecified cirrhosis of liver: Secondary | ICD-10-CM | POA: Diagnosis not present

## 2015-03-07 NOTE — Patient Instructions (Signed)
Therapeutic Phlebotomy, Care After  Refer to this sheet in the next few weeks. These instructions provide you with information about caring for yourself after your procedure. Your health care provider may also give you more specific instructions. Your treatment has been planned according to current medical practices, but problems sometimes occur. Call your health care provider if you have any problems or questions after your procedure.  WHAT TO EXPECT AFTER THE PROCEDURE  After your procedure, it is common to have:   Light-headedness or dizziness. You may feel faint.   Nausea.   Tiredness.  HOME CARE INSTRUCTIONS  Activities   Return to your normal activities as directed by your health care provider. Most people can go back to their normal activities right away.   Avoid strenuous physical activity and heavy lifting or pulling for about 5 hours after the procedure. Do not lift anything that is heavier than 10 lb (4.5 kg).   Athletes should avoid strenuous exercise for at least 12 hours.   Change positions slowly for the remainder of the day. This will help to prevent light-headedness or fainting.   If you feel light-headed, lie down until the feeling goes away.  Eating and Drinking   Be sure to eat well-balanced meals for the next 24 hours.   Drink enough fluid to keep your urine clear or pale yellow.   Avoid drinking alcohol on the day that you had the procedure.  Care of the Needle Insertion Site   Keep your bandage dry. You can remove the bandage after about 5 hours or as directed by your health care provider.   If you have bleeding from the needle insertion site, elevate your arm and press firmly on the site until the bleeding stops.   If you have bruising at the site, apply ice to the area:   Put ice in a plastic bag.   Place a towel between your skin and the bag.   Leave the ice on for 20 minutes, 2-3 times a day for the first 24 hours.   If the swelling does not go away after 24 hours, apply  a warm, moist washcloth to the area for 20 minutes, 2-3 times a day.  General Instructions   Avoid smoking for at least 30 minutes after the procedure.   Keep all follow-up visits as directed by your health care provider. It is important to continue with further therapeutic phlebotomy treatments as directed.  SEEK MEDICAL CARE IF:   You have redness, swelling, or pain at the needle insertion site.   You have fluid, blood, or pus coming from the needle insertion site.   You feel light-headed, dizzy, or nauseated, and the feeling does not go away.   You notice new bruising at the needle insertion site.   You feel weaker than normal.   You have a fever or chills.  SEEK IMMEDIATE MEDICAL CARE IF:   You have severe nausea or vomiting.   You have chest pain.   You have trouble breathing.    This information is not intended to replace advice given to you by your health care provider. Make sure you discuss any questions you have with your health care provider.    Document Released: 06/23/2010 Document Revised: 06/05/2014 Document Reviewed: 01/15/2014  [employer]vier Interactive Patient Education 2016 [employer]

## 2015-03-07 NOTE — Progress Notes (Signed)
Hematology and Oncology Follow Up Visit  Daniel Rivers [id number] 11-23-1950 65 y.o. 03/07/2015 10:39 AM Daniel Rivers, MDPang, Richard, MD   Principle Diagnosis: 65 year old with hemochromatosis and liver cirrhosis diagnosed in 2009. He established care at the [facility] in August of 2014.  Prior Therapy: He have been receiving intermittent phlebotomies in the past.  Current therapy: Every 3 month phlebotomy to achieve ferritin level close to 50.  Interim History:  [name] presents today for a followup visit. Since his last visit, he reports no changes in his health. He continues to feel well without illnesses or hospitalization. He denied any recent cardiac complications, chest pain or difficulty breathing. He is not reporting any bleeding or thrombosis episodes. Did not have any issues with his last phlebotomy 3 months ago.    He continues to be very active and performs activities of daily living. Has not reported any problem with mobility including falls or syncope.  He has not reported any headaches or blurry vision or alteration of his mental status. Has not had any genitourinary complaints of frequency urgency or hesitancy. Has not reported any lymphadenopathy or petechiae. Has not reported any easy bruisability or any bleeding. Report any new skeletal complaints. The rest of his review of symptoms is unremarkable.   Medications: I have reviewed the patient's current medications.  Current Outpatient Prescriptions  Medication Sig Dispense Refill  . ACCU-CHEK FASTCLIX LANCETS MISC     . aspirin 81 MG tablet Take 81 mg by mouth daily.     . bisacodyl (DULCOLAX) 5 MG EC tablet Take 5 mg by mouth at bedtime. 2 at bedtime    . carvedilol (COREG) 3.125 MG tablet Take 3.125 mg by mouth 2 (two) times daily.    . Cholecalciferol (VITAMIN D3) 2000 UNITS TABS Take 1 tablet by mouth daily.    . furosemide (LASIX) 20 MG tablet Take 20 mg by mouth as needed for fluid.     [name]  glimepiride (AMARYL) 2 MG tablet Take 1 mg by mouth daily before breakfast.     . HYDROcodone-acetaminophen (NORCO) 7.5-325 MG tablet Take 1 tablet by mouth every 6 (six) hours as needed (pain). 1 month supply 120 tablet 0  . isosorbide mononitrate (IMDUR) 60 MG 24 hr tablet Take 1 tablet by mouth daily.  1  . KRILL OIL PO Take 1 tablet by mouth daily.     [name] losartan (COZAAR) 100 MG tablet Take 100 mg by mouth daily.    . Magnesium Oxide 400 MG CAPS Take 1 capsule by mouth daily.    . metFORMIN (GLUCOPHAGE) 1000 MG tablet Take 1,000 mg by mouth every 12 (twelve) hours.    [name] morphine (MS CONTIN) 60 MG 12 hr tablet Take 1 tablet (60 mg total) by mouth 3 (three) times daily. 90 tablet 0  . Multiple Vitamin (MULTIVITAMIN) capsule Take 1 capsule by mouth daily. NO IRON    . NITROSTAT 0.4 MG SL tablet Place 0.4 mg under the tongue every 5 (five) minutes as needed for chest pain.     . polyethylene glycol powder (GLYCOLAX/MIRALAX) powder Using measuring cap mix  17gm in water and drink two times daily 3162 g 11  . prasugrel (EFFIENT) 10 MG TABS tablet Take 10 mg by mouth daily.    . pravastatin (PRAVACHOL) 80 MG tablet Take 80 mg by mouth daily.    . Psyllium (METAMUCIL PO) Take 17 g/day by mouth 2 (two) times daily.    [name] zolpidem (AMBIEN)  10 MG tablet Take 10 mg by mouth at bedtime as needed for sleep.      No current facility-administered medications for this visit.     Allergies:  Allergies  Allergen Reactions  . Amlodipine Besy-Benazepril Hcl Cough  . Naproxen Other (See Comments)    Worsens Tinnitus    Past Medical History, Surgical history, Social history, and Family History were reviewed and updated.  [name] Physical Exam: Blood pressure 124/72, pulse 83, temperature 98 F (36.7 C), temperature source Oral, resp. rate 16, height 6\' 4"  (1.93 m), weight 257 lb 11.2 oz (116.892 kg), SpO2 99 %. ECOG: 1 General appearance: alert and cooperative. Well-appearing gentleman without  distress. Head: Normocephalic, without obvious abnormality no oral ulcers or lesions. Sclerae anicteric. Neck: no adenopathy Lymph nodes: Cervical, supraclavicular, and axillary nodes normal. Heart:regular rate and rhythm, S1, S2 normal, no murmur, click, rub or gallop Lung:chest clear, no wheezing, rales, normal symmetric air entry. Abdomin: soft, non-tender, without masses or organomegaly shifting dullness or ascites. EXT:no erythema, induration, or nodules   Lab Results: Lab Results  Component Value Date   WBC 4.2 02/28/2015   HGB 14.0 02/28/2015   HCT 42.5 02/28/2015   MCV 87.5 02/28/2015   PLT 121* 02/28/2015     Chemistry      Component Value Date/Time   NA 138 02/28/2015 1041   NA 140 06/08/2014 0342   K 4.1 02/28/2015 1041   K 4.1 06/08/2014 0342   CL 105 06/08/2014 0342   CO2 25 02/28/2015 1041   CO2 28 06/08/2014 0342   BUN 12.0 02/28/2015 1041   BUN 9 06/08/2014 0342   CREATININE 0.9 02/28/2015 1041   CREATININE 0.87 06/08/2014 0342      Component Value Date/Time   CALCIUM 9.5 02/28/2015 1041   CALCIUM 9.3 06/08/2014 0342   ALKPHOS 65 02/28/2015 1041   AST 50* 02/28/2015 1041   ALT 44 02/28/2015 1041   BILITOT 1.26* 02/28/2015 1041        Results for Daniel, Rivers (MRN [id number]) as of 03/07/2015 10:41  Ref. Range 11/21/2014 11:25 02/28/2015 10:41  Ferritin Latest Ref Range: 22-316 ng/ml 67 67      Impression and Plan:  65 year old gentleman with the following issues:  1. Hemachromatosis: he is currently on phlebotomy to keep his ferritin close to 50.   His ferritin on 02/28/2015 remains around 67. The goal is to get his ferritin close to 50 and we'll proceed with phlebotomy. Risks and benefits of this procedure was reviewed again and is agreeable to proceed.   We will continue to monitor his ferritin every 3 months and proceed with phlebotomy as needed.  2. Cirrhosis of the liver: He follows up with gastroenterology regarding that. He has no  stigmata of decompensated liver disease.  3. Thrombocytopenia: This is likely related to his cirrhosis of the liver and his counts are stable recently.  4. Coronary artery disease: Status post catheterization and stenting. He is followed by cardiology regarding this issue.  5. Follow-up: Will be in 3 months for possible repeat phlebotomy.   [facility], MD 2/2/201710:39 AM

## 2015-03-07 NOTE — Progress Notes (Signed)
Phlebotomy performed via left antecubital, 500 ml removed, patient tolerated procedure well, snack and drink offered and observed fro 30 minutes post procedure.

## 2015-03-07 NOTE — Telephone Encounter (Signed)
per pof to sch pt appt-sent MW email to sch phlebotmy -pt to get updated copy b4 leaving

## 2015-03-07 NOTE — Telephone Encounter (Signed)
Per staff message and POF I have scheduled appts. Advised scheduler of appts. JMW  

## 2015-03-07 NOTE — Telephone Encounter (Signed)
per pfo to sch pt appt-gave pt copy of avs °

## 2015-04-09 ENCOUNTER — Telehealth: Payer: Self-pay

## 2015-04-09 DIAGNOSIS — M459 Ankylosing spondylitis of unspecified sites in spine: Secondary | ICD-10-CM

## 2015-04-09 NOTE — Telephone Encounter (Signed)
That would be ok.

## 2015-04-09 NOTE — Telephone Encounter (Signed)
Pt is requesting a refill on his MS Contin and Norco. Please advise on refill. Pt has an appt with ET on 03/20.

## 2015-04-10 MED ORDER — HYDROCODONE-ACETAMINOPHEN 7.5-325 MG PO TABS: 1.0000 | ORAL_TABLET | Freq: Four times a day (QID) | ORAL | Status: DC | PRN

## 2015-04-10 MED ORDER — MORPHINE SULFATE ER 60 MG PO TBCR: 60.0000 mg | EXTENDED_RELEASE_TABLET | Freq: Three times a day (TID) | ORAL | Status: DC

## 2015-04-10 NOTE — Telephone Encounter (Signed)
Scripts printed. ZS signed. Pt aware of scripts.

## 2015-04-19 DIAGNOSIS — E7800 Pure hypercholesterolemia, unspecified: Secondary | ICD-10-CM | POA: Diagnosis not present

## 2015-04-19 DIAGNOSIS — E119 Type 2 diabetes mellitus without complications: Secondary | ICD-10-CM | POA: Diagnosis not present

## 2015-04-19 DIAGNOSIS — E1165 Type 2 diabetes mellitus with hyperglycemia: Secondary | ICD-10-CM | POA: Diagnosis not present

## 2015-04-19 DIAGNOSIS — I10 Essential (primary) hypertension: Secondary | ICD-10-CM | POA: Diagnosis not present

## 2015-04-22 ENCOUNTER — Encounter: Payer: Self-pay | Admitting: Registered Nurse

## 2015-04-22 ENCOUNTER — Encounter: Payer: Medicare Other | Attending: Physical Medicine & Rehabilitation | Admitting: Registered Nurse

## 2015-04-22 VITALS — BP 113/69 | HR 88

## 2015-04-22 DIAGNOSIS — M459 Ankylosing spondylitis of unspecified sites in spine: Secondary | ICD-10-CM

## 2015-04-22 DIAGNOSIS — Z5181 Encounter for therapeutic drug level monitoring: Secondary | ICD-10-CM | POA: Diagnosis not present

## 2015-04-22 DIAGNOSIS — Z79899 Other long term (current) drug therapy: Secondary | ICD-10-CM | POA: Diagnosis not present

## 2015-04-22 DIAGNOSIS — G894 Chronic pain syndrome: Secondary | ICD-10-CM

## 2015-04-22 DIAGNOSIS — M7060 Trochanteric bursitis, unspecified hip: Secondary | ICD-10-CM

## 2015-04-22 MED ORDER — HYDROCODONE-ACETAMINOPHEN 7.5-325 MG PO TABS: 1.0000 | ORAL_TABLET | Freq: Four times a day (QID) | ORAL | Status: DC | PRN

## 2015-04-22 MED ORDER — MORPHINE SULFATE ER 60 MG PO TBCR: 60.0000 mg | EXTENDED_RELEASE_TABLET | Freq: Three times a day (TID) | ORAL | Status: DC

## 2015-04-22 NOTE — Progress Notes (Signed)
Subjective:    Patient ID: Daniel Rivers, male    DOB: 09-13-1950, 65 y.o.   MRN: [id number]  HPI: [name] is a 65 year old male who returns for chronic pain and medication refill. He states his pain is located in his neck and lower back. He rates his pain 6. His current exercise regime is performing stretching exercises and walking in his home. Also states he is walking a mile a day.  Pain Inventory Average Pain 5 Pain Right Now 6 My pain is sharp, burning, dull, stabbing, tingling and aching  In the last 24 hours, has pain interfered with the following? General activity 4 Relation with others 2 Enjoyment of life 3 What TIME of day is your pain at its worst? evening and night Sleep (in general) Fair  Pain is worse with: walking, bending, sitting, inactivity, standing and some activites Pain improves with: therapy/exercise, pacing activities and medication Relief from Meds: 5  Mobility Do you have any goals in this area?  no  Function Do you have any goals in this area?  no  Neuro/Psych bowel control problems weakness numbness tingling trouble walking  Prior Studies Any changes since last visit?  no  Physicians involved in your care Any changes since last visit?  no   History reviewed. No pertinent family history. Social History   Social History  . Marital Status: Widowed    Spouse Name: N/A  . Number of Children: N/A  . Years of Education: N/A   Social History Main Topics  . Smoking status: Former Smoker -- 5.00 packs/day for 23 years    Types: Cigarettes    Quit date: 06/16/1986  . Smokeless tobacco: Never Used  . Alcohol Use: Yes     Comment: 05/22/2014  "used to drink casually; never had problem w/it; none since ~ 2004"  . Drug Use: No  . Sexual Activity: No   Other Topics Concern  . None   Social History Narrative   Past Surgical History  Procedure Laterality Date  . Cortisone injection    . Small intestine surgery  1960   "took out 14 inches"  . Back surgery    . Ulnar tunnel release Left   . Bone tissue      "I've had removal of excess tissue as a result of my ankylosing spondylitis"  . Anal fissure repair    . Shoulder arthroscopy w/ rotator cuff repair Bilateral   . Colon surgery    . Carpal tunnel release Bilateral   . Ankle fracture surgery Left   . Lumbar disc surgery  X 2    "ruptured/herniated/growing together"  . Fracture surgery    . Coronary angioplasty with stent placement  2009; 05/22/2014    ?3; ?1  . Left heart catheterization with coronary angiogram N/A 05/22/2014    Procedure: LEFT HEART CATHETERIZATION WITH CORONARY ANGIOGRAM;  Surgeon: Adrian Prows, MD;  Location: [facility] CATH LAB;  Service: Cardiovascular;  Laterality: N/A;  . Percutaneous coronary stent intervention (pci-s)  05/22/2014    Procedure: PERCUTANEOUS CORONARY STENT INTERVENTION (PCI-S);  Surgeon: Adrian Prows, MD;  Location[facility] CATH LAB;  Service: Cardiovascular;;  mid LAD DES  . Cardiac catheterization N/A 06/07/2014    Procedure: Coronary Stent Intervention;  Surgeon: Adrian Prows, MD;  Location: [facility] INVASIVE CV LAB CUPID;  Service: Cardiovascular;  Laterality: N/A;  . Percutaneous coronary stent intervention (pci-s)  06/07/2014    des to rca   Past Medical History  Diagnosis  Date  . Ankylosing spondylitis of lumbar region [facility])   . Ankylosing spondylitis of cervical region ([place])   . Obesity   . Bursitis, trochanteric   . Tendonitis   . Depression   . Anxiety   . Hypertension   . High cholesterol   . Rotator cuff syndrome   . Type II diabetes mellitus ([place])   . Hemochromatosis   . GERD (gastroesophageal reflux disease)   . History of hiatal hernia   . Daily headache     "recently; related to RX for my heart" (05/21/2014)  . Osteoarthritis     "joints" (05/20/2104)  . Chronic back pain   . Chronic cervical pain   . BPH (benign prostatic hypertrophy)   . 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  Coronary artery disease    BP 113/69 mmHg  Pulse 88  SpO2 97%  Opioid Risk Score:   Fall Risk Score:  `1  Depression screen PHQ 2/9  Depression screen [facility] 2/9 04/22/2015 05/08/2014 02/09/2014 02/09/2014 11/08/2013  Decreased Interest 2 2 0 0 0  Down, Depressed, Hopeless 0 0 0 0 0  PHQ - 2 Score 2 2 0 0 0  Altered sleeping - 0 - - -  Tired, decreased energy - 3 - - -  Change in appetite - 0 - - -  Feeling bad or failure about yourself  - 1 - - -  Trouble concentrating - 1 - - -  Moving slowly or fidgety/restless - 0 - - -  Suicidal thoughts - 0 - - -  PHQ-9 Score - 7 - - -     Review of Systems  All other systems reviewed and are negative.      Objective:   Physical Exam  Constitutional: He is oriented to person, place, and time. He appears well-developed and well-nourished.  HENT:  Head: Normocephalic and atraumatic.  Neck: Normal range of motion. Neck supple.  Cardiovascular: Normal rate and regular rhythm.   Pulmonary/Chest: Effort normal and breath sounds normal.  Musculoskeletal:  Normal Muscle Bulk and Muscle Testing Reveals: Upper Extremities: Full ROM and Muscle Strength 5/5 Lumbar Paraspinal Tenderness: L-4- L-5 Lower Extremities: Full ROM and Muscle Strength 5/5 Arises from chair with ease using cane for support Narrow Based Gait   Neurological: He is alert and oriented to person, place, and time.  Skin: Skin is warm and dry.  Psychiatric: He has a normal mood and affect.  Nursing note and vitals reviewed.         Assessment & Plan:  1. 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 Second script given for the following month. Per Dr. Naaman Plummer: He may come pick up an RX for month 3. [name] instructed to call on June 1st. He verbalizes understanding. 2. Right greater trochanter bursitis: No complaints today. Continue with  exercise and current medication regime.  20 minutes of face to face patient care time was spent during this visit. All questions were encouraged and answered  F/U in 3 month

## 2015-04-27 LAB — TOXASSURE SELECT,+ANTIDEPR,UR: PDF: 0

## 2015-04-27 LAB — 6-ACETYLMORPHINE,TOXASSURE ADD
6-ACETYLMORPHINE: NEGATIVE
6-acetylmorphine: NOT DETECTED ng/mg creat

## 2015-05-02 DIAGNOSIS — H2513 Age-related nuclear cataract, bilateral: Secondary | ICD-10-CM | POA: Diagnosis not present

## 2015-05-02 DIAGNOSIS — H40013 Open angle with borderline findings, low risk, bilateral: Secondary | ICD-10-CM | POA: Diagnosis not present

## 2015-05-02 NOTE — Progress Notes (Signed)
Urine drug screen for this encounter is consistent for prescribed medication morphine and ambien.  Parent drug Hydrocodone is absent but hydromorphone is a metabolite.

## 2015-06-03 ENCOUNTER — Other Ambulatory Visit (HOSPITAL_BASED_OUTPATIENT_CLINIC_OR_DEPARTMENT_OTHER): Payer: Medicare Other

## 2015-06-03 DIAGNOSIS — K7460 Unspecified cirrhosis of liver: Secondary | ICD-10-CM

## 2015-06-03 DIAGNOSIS — Z862 Personal history of diseases of the blood and blood-forming organs and certain disorders involving the immune mechanism: Secondary | ICD-10-CM

## 2015-06-03 DIAGNOSIS — D696 Thrombocytopenia, unspecified: Secondary | ICD-10-CM

## 2015-06-03 LAB — CBC WITH DIFFERENTIAL/PLATELET
BASO%: 0.4 % (ref 0.0–2.0)
BASOS ABS: 0 10*3/uL (ref 0.0–0.1)
EOS%: 1.4 % (ref 0.0–7.0)
Eosinophils Absolute: 0.1 10*3/uL (ref 0.0–0.5)
HEMATOCRIT: 41.5 % (ref 38.4–49.9)
HEMOGLOBIN: 13.9 g/dL (ref 13.0–17.1)
LYMPH#: 1.4 10*3/uL (ref 0.9–3.3)
LYMPH%: 27 % (ref 14.0–49.0)
MCH: 29.4 pg (ref 27.2–33.4)
MCHC: 33.5 g/dL (ref 32.0–36.0)
MCV: 87.7 fL (ref 79.3–98.0)
MONO#: 0.5 10*3/uL (ref 0.1–0.9)
MONO%: 9.9 % (ref 0.0–14.0)
NEUT#: 3.1 10*3/uL (ref 1.5–6.5)
NEUT%: 61.3 % (ref 39.0–75.0)
Platelets: 127 10*3/uL — ABNORMAL LOW (ref 140–400)
RBC: 4.73 10*6/uL (ref 4.20–5.82)
RDW: 13.8 % (ref 11.0–14.6)
WBC: 5 10*3/uL (ref 4.0–10.3)

## 2015-06-03 LAB — COMPREHENSIVE METABOLIC PANEL
ALBUMIN: 3.8 g/dL (ref 3.5–5.0)
ALT: 38 U/L (ref 0–55)
AST: 46 U/L — AB (ref 5–34)
Alkaline Phosphatase: 61 U/L (ref 40–150)
Anion Gap: 7 mEq/L (ref 3–11)
BUN: 12.1 mg/dL (ref 7.0–26.0)
CALCIUM: 9.8 mg/dL (ref 8.4–10.4)
CHLORIDE: 103 meq/L (ref 98–109)
CO2: 29 mEq/L (ref 22–29)
CREATININE: 0.9 mg/dL (ref 0.7–1.3)
EGFR: 89 mL/min/{1.73_m2} — ABNORMAL LOW (ref >90)
GLUCOSE: 125 mg/dL (ref 70–140)
POTASSIUM: 4.3 meq/L (ref 3.5–5.1)
SODIUM: 139 meq/L (ref 136–145)
Total Bilirubin: 1.58 mg/dL — ABNORMAL HIGH (ref 0.20–1.20)
Total Protein: 7.5 g/dL (ref 6.4–8.3)

## 2015-06-03 LAB — FERRITIN: Ferritin: 72 ng/ml (ref 22–316)

## 2015-06-06 ENCOUNTER — Ambulatory Visit (HOSPITAL_BASED_OUTPATIENT_CLINIC_OR_DEPARTMENT_OTHER): Payer: Medicare Other | Admitting: Oncology

## 2015-06-06 ENCOUNTER — Telehealth: Payer: Self-pay | Admitting: Oncology

## 2015-06-06 ENCOUNTER — Ambulatory Visit: Payer: Medicare Other

## 2015-06-06 VITALS — BP 124/70 | HR 86 | Temp 98.1°F | Resp 19 | Wt 262.3 lb

## 2015-06-06 DIAGNOSIS — D6959 Other secondary thrombocytopenia: Secondary | ICD-10-CM

## 2015-06-06 DIAGNOSIS — K7460 Unspecified cirrhosis of liver: Secondary | ICD-10-CM | POA: Diagnosis not present

## 2015-06-06 DIAGNOSIS — Z862 Personal history of diseases of the blood and blood-forming organs and certain disorders involving the immune mechanism: Secondary | ICD-10-CM

## 2015-06-06 NOTE — Progress Notes (Signed)
One 500 unit  Phlebotomy completed in pt's lt AC site.  Pt tolerated procedure well. Ate snack after procedure.  Lt AC site CD,I upon discharge

## 2015-06-06 NOTE — Progress Notes (Signed)
Hematology and Oncology Follow Up Visit  Daniel Rivers [id number] October 14, 1950 65 y.o. 06/06/2015 1:24 PM [name], MDPang, Richard, MD   Principle Diagnosis: 65 year old with hemochromatosis and liver cirrhosis diagnosed in 2009. He established care at the [facility] in August of 2014. 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   Prior Therapy: He have been receiving intermittent phlebotomies in the past.  Current therapy: Every 3 month phlebotomy to achieve ferritin level close to 50.  Interim History:  Daniel Rivers presents today for a followup visit. Since his last visit, he continues to do very well without any recent complaints. He denied any recent cardiac complaints such as chest pain, dyspnea on exertion or shortness of breath. He is not reporting any bleeding or thrombosis episodes. Did not have any issues with his last phlebotomy 3 months ago.  He denied any headaches or thrombosis episodes. He denied any pain crisis. He remains very active and performs activities of daily living.  He has not reported any headaches or blurry vision or alteration of his mental status. Has not had any genitourinary complaints of frequency urgency or hesitancy. Has not reported any lymphadenopathy or petechiae. Has not reported any easy bruisability or any bleeding. Report any new skeletal complaints. The rest of his review of symptoms is unremarkable.   Medications: I have reviewed the patient's current medications.  Current Outpatient Prescriptions  Medication Sig Dispense Refill  . ACCU-CHEK FASTCLIX LANCETS MISC     . aspirin 81 MG tablet Take 81 mg by mouth daily.     . bisacodyl (DULCOLAX) 5 MG EC tablet Take 5 mg by mouth at bedtime. 2 at bedtime    . buPROPion (WELLBUTRIN XL) 300 MG 24 hr tablet Take 1 tablet by mouth daily.    . carvedilol (COREG) 3.125 MG tablet Take 3.125 mg by mouth 2 (two) times daily.    . Cholecalciferol (VITAMIN D3)  2000 UNITS TABS Take 1 tablet by mouth daily.    . clopidogrel (PLAVIX) 75 MG tablet Take 1 tablet by mouth daily.    . furosemide (LASIX) 20 MG tablet Take 20 mg by mouth as needed for fluid.     [name] glimepiride (AMARYL) 2 MG tablet Take 1 mg by mouth daily before breakfast.     . HYDROcodone-acetaminophen (NORCO) 7.5-325 MG tablet Take 1 tablet by mouth every 6 (six) hours as needed (pain). 1 month supply 120 tablet 0  . isosorbide mononitrate (IMDUR) 60 MG 24 hr tablet Take 1 tablet by mouth daily.  1  . KRILL OIL PO Take 1 tablet by mouth daily.     [name] losartan (COZAAR) 100 MG tablet Take 100 mg by mouth daily.    . Magnesium Oxide 400 MG CAPS Take 1 capsule by mouth daily.    . metFORMIN (GLUCOPHAGE) 1000 MG tablet Take 1,000 mg by mouth every 12 (twelve) hours.    [name] morphine (MS CONTIN) 60 MG 12 hr tablet Take 1 tablet (60 mg total) by mouth 3 (three) times daily. 90 tablet 0  . Multiple Vitamin (MULTIVITAMIN) capsule Take 1 capsule by mouth daily. NO IRON    . NITROSTAT 0.4 MG SL tablet Place 0.4 mg under the tongue every 5 (five) minutes as needed for chest pain.     . polyethylene glycol powder (GLYCOLAX/MIRALAX) powder Using measuring cap mix  17gm in water and drink two times daily 3162 g 11  . pravastatin (PRAVACHOL) 80 MG tablet Take 80  mg by mouth daily.    . Psyllium (METAMUCIL PO) Take 17 g/day by mouth 2 (two) times daily.    [name] zolpidem (AMBIEN) 10 MG tablet Take 10 mg by mouth at bedtime as needed for sleep.      No current facility-administered medications for this visit.     Allergies:  Allergies  Allergen Reactions  . Amlodipine Besy-Benazepril Hcl Cough  . Naproxen Other (See Comments)    Worsens Tinnitus    Past Medical History, Surgical history, Social history, and Family History were reviewed and updated.  [name] Physical Exam: Blood pressure 124/70, pulse 86, temperature 98.1 F (36.7 C), temperature source Oral, resp. rate 19, weight 262 lb 4.8 oz (118.978 kg),  SpO2 98 %. ECOG: 1 General appearance: Pleasant-appearing gentleman without distress. Head: Normocephalic, without obvious abnormality no scleral icterus. Neck: no adenopathy Lymph nodes: Cervical, supraclavicular, and axillary nodes normal. Heart:regular rate and rhythm, S1, S2 normal, no murmur, click, rub or gallop Lung:chest clear, no wheezing, rales, normal symmetric air entry. Abdomin: soft, non-tender, without masses or organomegaly no rebound or guarding. EXT:no erythema, induration, or nodules   Lab Results: Lab Results  Component Value Date   WBC 5.0 06/03/2015   HGB 13.9 06/03/2015   HCT 41.5 06/03/2015   MCV 87.7 06/03/2015   PLT 127* 06/03/2015     Chemistry      Component Value Date/Time   NA 139 06/03/2015 1018   NA 140 06/08/2014 0342   K 4.3 06/03/2015 1018   K 4.1 06/08/2014 0342   CL 105 06/08/2014 0342   CO2 29 06/03/2015 1018   CO2 28 06/08/2014 0342   BUN 12.1 06/03/2015 1018   BUN 9 06/08/2014 0342   CREATININE 0.9 06/03/2015 1018   CREATININE 0.87 06/08/2014 0342      Component Value Date/Time   CALCIUM 9.8 06/03/2015 1018   CALCIUM 9.3 06/08/2014 0342   ALKPHOS 61 06/03/2015 1018   AST 46* 06/03/2015 1018   ALT 38 06/03/2015 1018   BILITOT 1.58* 06/03/2015 1018       Results for Daniel Rivers, Daniel Rivers (MRN [id number]) as of 06/06/2015 13:16  Ref. Range 02/28/2015 10:41 06/03/2015 10:18  Ferritin Latest Ref Range: 22-316 ng/ml 67 72        Impression and Plan:  65 year old gentleman with the following issues:  1. Hemachromatosis: he is currently on phlebotomy to keep his ferritin close to 50.   His ferritin continues to be under reasonable control with the current phlebotomy schedule. On 06/03/2015 his ferritin is around 72 and he will receive phlebotomy today. The plan is to continue with the same schedule every 3 months. Schedule can be changed to more frequent episodes if he has any signs or symptoms of increase iron overload.    2.  Cirrhosis of the liver: He follows up with gastroenterology regarding that. His liver function tests appear to be adequate.  3. 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 His platelet count close to normal range without any decline.  4. Coronary artery disease: Status post catheterization and stenting. He is followed by cardiology regarding this issue.  5. Follow-up: Will be in 3 months for possible repeat phlebotomy.   Zola Button, MD 5/4/20171:24 PM

## 2015-06-06 NOTE — Patient Instructions (Signed)
Therapeutic Phlebotomy, Care After  Refer to this sheet in the next few weeks. These instructions provide you with information about caring for yourself after your procedure. Your health care provider may also give you more specific instructions. Your treatment has been planned according to current medical practices, but problems sometimes occur. Call your health care provider if you have any problems or questions after your procedure.  WHAT TO EXPECT AFTER THE PROCEDURE  After your procedure, it is common to have:   Light-headedness or dizziness. You may feel faint.   Nausea.   Tiredness.  HOME CARE INSTRUCTIONS  Activities   Return to your normal activities as directed by your health care provider. Most people can go back to their normal activities right away.   Avoid strenuous physical activity and heavy lifting or pulling for about 5 hours after the procedure. Do not lift anything that is heavier than 10 lb (4.5 kg).   Athletes should avoid strenuous exercise for at least 12 hours.   Change positions slowly for the remainder of the day. This will help to prevent light-headedness or fainting.   If you feel light-headed, lie down until the feeling goes away.  Eating and Drinking   Be sure to eat well-balanced meals for the next 24 hours.   Drink enough fluid to keep your urine clear or pale yellow.   Avoid drinking alcohol on the day that you had the procedure.  Care of the Needle Insertion Site   Keep your bandage dry. You can remove the bandage after about 5 hours or as directed by your health care provider.   If you have bleeding from the needle insertion site, elevate your arm and press firmly on the site until the bleeding stops.   If you have bruising at the site, apply ice to the area:   Put ice in a plastic bag.   Place a towel between your skin and the bag.   Leave the ice on for 20 minutes, 2-3 times a day for the first 24 hours.   If the swelling does not go away after 24 hours, apply  a warm, moist washcloth to the area for 20 minutes, 2-3 times a day.  General Instructions   Avoid smoking for at least 30 minutes after the procedure.   Keep all follow-up visits as directed by your health care provider. It is important to continue with further therapeutic phlebotomy treatments as directed.  SEEK MEDICAL CARE IF:   You have redness, swelling, or pain at the needle insertion site.   You have fluid, blood, or pus coming from the needle insertion site.   You feel light-headed, dizzy, or nauseated, and the feeling does not go away.   You notice new bruising at the needle insertion site.   You feel weaker than normal.   You have a fever or chills.  SEEK IMMEDIATE MEDICAL CARE IF:   You have severe nausea or vomiting.   You have chest pain.   You have trouble breathing.    This information is not intended to replace advice given to you by your health care provider. Make sure you discuss any questions you have with your health care provider.    Document Released: 06/23/2010 Document Revised: 06/05/2014 Document Reviewed: 01/15/2014  [employer]vier Interactive Patient Education 2016 [employer]

## 2015-06-06 NOTE — Telephone Encounter (Signed)
Gave pt appt & avs °

## 2015-07-10 ENCOUNTER — Telehealth: Payer: Self-pay | Admitting: Physical Medicine & Rehabilitation

## 2015-07-10 DIAGNOSIS — M459 Ankylosing spondylitis of unspecified sites in spine: Secondary | ICD-10-CM

## 2015-07-10 MED ORDER — HYDROCODONE-ACETAMINOPHEN 7.5-325 MG PO TABS: 1.0000 | ORAL_TABLET | Freq: Four times a day (QID) | ORAL | Status: DC | PRN

## 2015-07-10 MED ORDER — MORPHINE SULFATE ER 60 MG PO TBCR: 60.0000 mg | EXTENDED_RELEASE_TABLET | Freq: Three times a day (TID) | ORAL | Status: DC

## 2015-07-10 NOTE — Telephone Encounter (Signed)
Please advise 

## 2015-07-10 NOTE — Telephone Encounter (Addendum)
Patient is needing a refill on medication: Hydrocodone and Morphine.

## 2015-07-10 NOTE — Telephone Encounter (Signed)
meds written

## 2015-07-11 ENCOUNTER — Telehealth: Payer: Self-pay | Admitting: Registered Nurse

## 2015-07-11 DIAGNOSIS — M459 Ankylosing spondylitis of unspecified sites in spine: Secondary | ICD-10-CM

## 2015-07-11 MED ORDER — HYDROCODONE-ACETAMINOPHEN 7.5-325 MG PO TABS: 1.0000 | ORAL_TABLET | Freq: Four times a day (QID) | ORAL | Status: DC | PRN

## 2015-07-11 MED ORDER — MORPHINE SULFATE ER 60 MG PO TBCR: 60.0000 mg | EXTENDED_RELEASE_TABLET | Freq: Three times a day (TID) | ORAL | Status: DC

## 2015-07-11 NOTE — Telephone Encounter (Signed)
ZS printed scripts but did not sign them. Can you please reprint and sign for him?

## 2015-07-11 NOTE — Telephone Encounter (Signed)
Prescriptions printed

## 2015-07-22 ENCOUNTER — Encounter: Payer: Self-pay | Admitting: Registered Nurse

## 2015-07-22 ENCOUNTER — Encounter: Payer: Medicare Other | Attending: Physical Medicine & Rehabilitation | Admitting: Registered Nurse

## 2015-07-22 VITALS — BP 124/73 | HR 106 | Resp 16

## 2015-07-22 DIAGNOSIS — Z79899 Other long term (current) drug therapy: Secondary | ICD-10-CM

## 2015-07-22 DIAGNOSIS — F329 Major depressive disorder, single episode, unspecified: Secondary | ICD-10-CM | POA: Insufficient documentation

## 2015-07-22 DIAGNOSIS — F419 Anxiety disorder, unspecified: Secondary | ICD-10-CM | POA: Insufficient documentation

## 2015-07-22 DIAGNOSIS — G8929 Other chronic pain: Secondary | ICD-10-CM | POA: Insufficient documentation

## 2015-07-22 DIAGNOSIS — K449 Diaphragmatic hernia without obstruction or gangrene: Secondary | ICD-10-CM | POA: Diagnosis not present

## 2015-07-22 DIAGNOSIS — Z87891 Personal history of nicotine dependence: Secondary | ICD-10-CM | POA: Insufficient documentation

## 2015-07-22 DIAGNOSIS — M1990 Unspecified osteoarthritis, unspecified site: Secondary | ICD-10-CM | POA: Diagnosis not present

## 2015-07-22 DIAGNOSIS — M719 Bursopathy, unspecified: Secondary | ICD-10-CM | POA: Diagnosis not present

## 2015-07-22 DIAGNOSIS — G894 Chronic pain syndrome: Secondary | ICD-10-CM

## 2015-07-22 DIAGNOSIS — M25572 Pain in left ankle and joints of left foot: Secondary | ICD-10-CM

## 2015-07-22 DIAGNOSIS — E7800 Pure hypercholesterolemia, unspecified: Secondary | ICD-10-CM | POA: Insufficient documentation

## 2015-07-22 DIAGNOSIS — K219 Gastro-esophageal reflux disease without esophagitis: Secondary | ICD-10-CM | POA: Insufficient documentation

## 2015-07-22 DIAGNOSIS — E119 Type 2 diabetes mellitus without complications: Secondary | ICD-10-CM | POA: Insufficient documentation

## 2015-07-22 DIAGNOSIS — I10 Essential (primary) hypertension: Secondary | ICD-10-CM | POA: Insufficient documentation

## 2015-07-22 DIAGNOSIS — I2510 Atherosclerotic heart disease of native coronary artery without angina pectoris: Secondary | ICD-10-CM | POA: Diagnosis not present

## 2015-07-22 DIAGNOSIS — Z5181 Encounter for therapeutic drug level monitoring: Secondary | ICD-10-CM

## 2015-07-22 DIAGNOSIS — M459 Ankylosing spondylitis of unspecified sites in spine: Secondary | ICD-10-CM | POA: Diagnosis not present

## 2015-07-22 DIAGNOSIS — M19072 Primary osteoarthritis, left ankle and foot: Secondary | ICD-10-CM | POA: Diagnosis not present

## 2015-07-22 DIAGNOSIS — R51 Headache: Secondary | ICD-10-CM | POA: Diagnosis not present

## 2015-07-22 DIAGNOSIS — N400 Enlarged prostate without lower urinary tract symptoms: Secondary | ICD-10-CM | POA: Insufficient documentation

## 2015-07-22 DIAGNOSIS — M452 Ankylosing spondylitis of cervical region: Secondary | ICD-10-CM | POA: Diagnosis not present

## 2015-07-22 DIAGNOSIS — M456 Ankylosing spondylitis lumbar region: Secondary | ICD-10-CM | POA: Insufficient documentation

## 2015-07-22 DIAGNOSIS — E669 Obesity, unspecified: Secondary | ICD-10-CM | POA: Diagnosis not present

## 2015-07-22 MED ORDER — MORPHINE SULFATE ER 60 MG PO TBCR: 60.0000 mg | EXTENDED_RELEASE_TABLET | Freq: Three times a day (TID) | ORAL | Status: DC

## 2015-07-22 MED ORDER — HYDROCODONE-ACETAMINOPHEN 7.5-325 MG PO TABS: 1.0000 | ORAL_TABLET | Freq: Four times a day (QID) | ORAL | Status: DC | PRN

## 2015-07-22 NOTE — Progress Notes (Signed)
Subjective:    Patient ID: Daniel Rivers, male    DOB: Nov 08, 1950, 65 y.o.   MRN: [id number]  HPI: Daniel Rivers is a 65 year old male who returns for chronic pain and medication refill. He states his pain is located in his lower back and left ankle. He denies falling and states his PCP is following his left ankle he has an appointment with him today.He rates his pain 7. His current exercise regime is performing stretching exercises and walking in his home. Also states he is walking a 1/2 mile a day. Daniel Rivers brought in his medications with no labels on the bottles. According to [facility] his MS Contin and Hydrocodone was picked up on 07/13/2015. Looked up the medication images  And medication is as prescribed. Educated Daniel Rivers to  bring medications with labels on them and the importance of this, he verbalizes understanding. Very apologetic.   Pain Inventory Average Pain 6 Pain Right Now 7 My pain is sharp, burning, dull, stabbing, tingling and aching  In the last 24 hours, has pain interfered with the following? General activity 7 Relation with others 5 Enjoyment of life 8 What TIME of day is your pain at its worst? evening Sleep (in general) Fair  Pain is worse with: walking, bending, sitting, inactivity, standing and some activites Pain improves with: rest, heat/ice, pacing activities and medication Relief from Meds: 6  Mobility Do you have any goals in this area?  no  Function Do you have any goals in this area?  no  Neuro/Psych tingling trouble walking  Prior Studies Any changes since last visit?  no  Physicians involved in your care Any changes since last visit?  no   No family history on file. Social History   Social History  . Marital Status: Widowed    Spouse Name: N/A  . Number of Children: N/A  . Years of Education: N/A   Social History Main Topics  . Smoking status: Former Smoker -- 5.00 packs/day for 23 years    Types: Cigarettes    Quit  date: 06/16/1986  . Smokeless tobacco: Never Used  . Alcohol Use: Yes     Comment: 05/22/2014  "used to drink casually; never had problem w/it; none since ~ 2004"  . Drug Use: No  . Sexual Activity: No   Other Topics Concern  . None   Social History Narrative   Past Surgical History  Procedure Laterality Date  . Cortisone injection    . Small intestine surgery  1960    "took out 14 inches"  . Back surgery    . Ulnar tunnel release Left   . Bone tissue      "I've had removal of excess tissue as a result of my ankylosing spondylitis"  . Anal fissure repair    . Shoulder arthroscopy w/ rotator cuff repair Bilateral   . Colon surgery    . Carpal tunnel release Bilateral   . Ankle fracture surgery Left   . Lumbar disc surgery  X 2    "ruptured/herniated/growing together"  . Fracture surgery    . Coronary angioplasty with stent placement  2009; 05/22/2014    ?3; ?1  . Left heart catheterization with coronary angiogram N/A 05/22/2014    Procedure: LEFT HEART CATHETERIZATION WITH CORONARY ANGIOGRAM;  Surgeon: Adrian Prows, MD;  Location: [facility] CATH LAB;  Service: Cardiovascular;  Laterality: N/A;  . Percutaneous coronary stent intervention (pci-s)  05/22/2014    Procedure: PERCUTANEOUS CORONARY  STENT INTERVENTION (PCI-S);  Surgeon: Adrian Prows, MD;  Location: [facility] CATH LAB;  Service: Cardiovascular;;  mid LAD DES  . Cardiac catheterization N/A 06/07/2014    Procedure: Coronary Stent Intervention;  Surgeon: Adrian Prows, MD;  Location: [facility] INVASIVE CV LAB CUPID;  Service: Cardiovascular;  Laterality: N/A;  . Percutaneous coronary stent intervention (pci-s)  06/07/2014    des to rca   Past Medical History  Diagnosis Date  . Ankylosing spondylitis of lumbar region ([place])   . Ankylosing spondylitis of cervical region ([place])   . Obesity   . Bursitis, trochanteric   . Tendonitis   . Depression   . Anxiety   . Hypertension   . High cholesterol   . Rotator cuff syndrome   . Type II diabetes mellitus  ([place])   . Hemochromatosis   . GERD (gastroesophageal reflux disease)   . History of hiatal hernia   . Daily headache     "recently; related to RX for my heart" (05/21/2014)  . Osteoarthritis     "joints" (05/20/2104)  . Chronic back pain   . Chronic cervical pain   . BPH (benign prostatic hypertrophy)   . 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 Coronary artery disease    BP 124/73 mmHg  Pulse 106  Resp 16  SpO2 95%  Opioid Risk Score:   Fall Risk Score:  `1  Depression screen PHQ 2/9  Depression screen [facility] 2/9 07/22/2015 04/22/2015 05/08/2014 02/09/2014 02/09/2014 11/08/2013  Decreased Interest 0 2 2 0 0 0  Down, Depressed, Hopeless 0 0 0 0 0 0  PHQ - 2 Score 0 2 2 0 0 0  Altered sleeping - - 0 - - -  Tired, decreased energy - - 3 - - -  Change in appetite - - 0 - - -  Feeling bad or failure about yourself  - - 1 - - -  Trouble concentrating - - 1 - - -  Moving slowly or fidgety/restless - - 0 - - -  Suicidal thoughts - - 0 - - -  PHQ-9 Score - - 7 - - -       Review of Systems  Constitutional:       High blood sugar  Hematological: Bruises/bleeds easily.  All other systems reviewed and are negative.      Objective:   Physical Exam  Constitutional: He is oriented to person, place, and time. He appears well-developed and well-nourished.  HENT:  Head: Normocephalic and atraumatic.  Neck: Normal range of motion. Neck supple.  Cervical Paraspinal Tenderness: C-5- C-6  Cardiovascular: Normal rate and regular rhythm.   Pulmonary/Chest: Effort normal and breath sounds normal.  Musculoskeletal:  Normal Muscle Bulk and Muscle Testing Reveals: Upper Extremities: Full ROM and Muscle Strength 5/5 Thoracic Paraspinal Tenderness: T-1- T-3 Lower Extremities: Full ROM  And Muscle Strength 5/5 Left Lower Extremity Flexion Produces Pain into Left Ankle Arises from chair with ease Antalgic  Gait   Neurological: He is alert and oriented to person, place,  and time.  Skin: Skin is warm and dry.  Psychiatric: He has a normal mood and affect.  Nursing note and vitals reviewed.         Assessment & Plan:  1. 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 Second script given for the following month. Per  Dr. Naaman Plummer: He may come pick up an RX for month 3. Daniel Rivers instructed to call on September 1st. He verbalizes understanding. 2. Right greater trochanter bursitis: No complaints today. Continue with exercise and current medication regime.  20 minutes of face to face patient care time was spent during this visit. All questions were encouraged and answered  F/U in 3 month

## 2015-07-24 DIAGNOSIS — E119 Type 2 diabetes mellitus without complications: Secondary | ICD-10-CM | POA: Diagnosis not present

## 2015-07-24 DIAGNOSIS — Z9861 Coronary angioplasty status: Secondary | ICD-10-CM | POA: Diagnosis not present

## 2015-07-24 DIAGNOSIS — I209 Angina pectoris, unspecified: Secondary | ICD-10-CM | POA: Diagnosis not present

## 2015-07-24 DIAGNOSIS — I2510 Atherosclerotic heart disease of native coronary artery without angina pectoris: Secondary | ICD-10-CM | POA: Diagnosis not present

## 2015-08-05 DIAGNOSIS — M25572 Pain in left ankle and joints of left foot: Secondary | ICD-10-CM | POA: Diagnosis not present

## 2015-08-28 DIAGNOSIS — M25572 Pain in left ankle and joints of left foot: Secondary | ICD-10-CM | POA: Diagnosis not present

## 2015-09-02 ENCOUNTER — Other Ambulatory Visit (HOSPITAL_BASED_OUTPATIENT_CLINIC_OR_DEPARTMENT_OTHER): Payer: Medicare Other

## 2015-09-02 DIAGNOSIS — Z862 Personal history of diseases of the blood and blood-forming organs and certain disorders involving the immune mechanism: Secondary | ICD-10-CM

## 2015-09-02 LAB — COMPREHENSIVE METABOLIC PANEL
ALT: 35 U/L (ref 0–55)
AST: 43 U/L — AB (ref 5–34)
Albumin: 3.7 g/dL (ref 3.5–5.0)
Alkaline Phosphatase: 70 U/L (ref 40–150)
Anion Gap: 10 mEq/L (ref 3–11)
BUN: 13.7 mg/dL (ref 7.0–26.0)
CHLORIDE: 102 meq/L (ref 98–109)
CO2: 29 meq/L (ref 22–29)
CREATININE: 0.9 mg/dL (ref 0.7–1.3)
Calcium: 9.8 mg/dL (ref 8.4–10.4)
EGFR: 89 mL/min/{1.73_m2} — ABNORMAL LOW (ref >90)
Glucose: 189 mg/dl — ABNORMAL HIGH (ref 70–140)
POTASSIUM: 4.5 meq/L (ref 3.5–5.1)
SODIUM: 140 meq/L (ref 136–145)
Total Bilirubin: 1.29 mg/dL — ABNORMAL HIGH (ref 0.20–1.20)
Total Protein: 7.2 g/dL (ref 6.4–8.3)

## 2015-09-02 LAB — CBC WITH DIFFERENTIAL/PLATELET
BASO%: 0.6 % (ref 0.0–2.0)
Basophils Absolute: 0 10*3/uL (ref 0.0–0.1)
EOS%: 1.4 % (ref 0.0–7.0)
Eosinophils Absolute: 0.1 10*3/uL (ref 0.0–0.5)
HCT: 40.7 % (ref 38.4–49.9)
HGB: 13.4 g/dL (ref 13.0–17.1)
LYMPH#: 1.2 10*3/uL (ref 0.9–3.3)
LYMPH%: 32.4 % (ref 14.0–49.0)
MCH: 28.7 pg (ref 27.2–33.4)
MCHC: 32.9 g/dL (ref 32.0–36.0)
MCV: 87 fL (ref 79.3–98.0)
MONO#: 0.3 10*3/uL (ref 0.1–0.9)
MONO%: 9.1 % (ref 0.0–14.0)
NEUT#: 2.1 10*3/uL (ref 1.5–6.5)
NEUT%: 56.5 % (ref 39.0–75.0)
Platelets: 105 10*3/uL — ABNORMAL LOW (ref 140–400)
RBC: 4.68 10*6/uL (ref 4.20–5.82)
RDW: 14.6 % (ref 11.0–14.6)
WBC: 3.7 10*3/uL — ABNORMAL LOW (ref 4.0–10.3)

## 2015-09-02 LAB — FERRITIN: Ferritin: 68 ng/ml (ref 22–316)

## 2015-09-04 ENCOUNTER — Other Ambulatory Visit: Payer: Self-pay | Admitting: Gastroenterology

## 2015-09-04 DIAGNOSIS — K7460 Unspecified cirrhosis of liver: Secondary | ICD-10-CM | POA: Diagnosis not present

## 2015-09-04 DIAGNOSIS — E119 Type 2 diabetes mellitus without complications: Secondary | ICD-10-CM | POA: Diagnosis not present

## 2015-09-04 DIAGNOSIS — K7469 Other cirrhosis of liver: Secondary | ICD-10-CM

## 2015-09-05 ENCOUNTER — Ambulatory Visit (HOSPITAL_BASED_OUTPATIENT_CLINIC_OR_DEPARTMENT_OTHER): Payer: Medicare Other

## 2015-09-05 ENCOUNTER — Ambulatory Visit (HOSPITAL_BASED_OUTPATIENT_CLINIC_OR_DEPARTMENT_OTHER): Payer: Medicare Other | Admitting: Oncology

## 2015-09-05 ENCOUNTER — Telehealth: Payer: Self-pay | Admitting: Oncology

## 2015-09-05 VITALS — BP 136/83 | HR 90 | Temp 97.7°F | Resp 18

## 2015-09-05 VITALS — BP 119/73 | HR 82 | Temp 98.6°F | Resp 19 | Wt 264.3 lb

## 2015-09-05 DIAGNOSIS — Z862 Personal history of diseases of the blood and blood-forming organs and certain disorders involving the immune mechanism: Secondary | ICD-10-CM

## 2015-09-05 DIAGNOSIS — D6959 Other secondary thrombocytopenia: Secondary | ICD-10-CM | POA: Diagnosis not present

## 2015-09-05 DIAGNOSIS — I2510 Atherosclerotic heart disease of native coronary artery without angina pectoris: Secondary | ICD-10-CM

## 2015-09-05 DIAGNOSIS — K7460 Unspecified cirrhosis of liver: Secondary | ICD-10-CM

## 2015-09-05 NOTE — Progress Notes (Signed)
Hematology and Oncology Follow Up Visit  Daniel Rivers [id number] 08/01/1950 65 y.o. 09/05/2015 10:19 AM Daniel Rivers, MDPang, Richard, MD   Principle Diagnosis: 65 year old with hemochromatosis and liver cirrhosis diagnosed in 2009. He established care at the [facility] in August of 2014. 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   Prior Therapy: He have been receiving intermittent phlebotomies in the past.  Current therapy: Every 3 month phlebotomy to achieve ferritin level close to 50.  Interim History:  Daniel Rivers presents today for a followup visit. Since his last visit, he reports no changes in his health.  He denied any recent cardiac complaints such as chest pain, dyspnea on exertion or shortness of breath. He denied any complications related to phlebotomy including falls, syncope or dizziness.   He is not reporting any bleeding or thrombosis episodes. He denies any constitutional symptoms or joint pain. Continues to perform activities of daily living without any decline.  He has not reported any headaches or blurry vision or alteration of his mental status. Has not had any genitourinary complaints of frequency urgency or hesitancy. Has not reported any lymphadenopathy or petechiae. Has not reported any easy bruisability or any bleeding. Report any new skeletal complaints. The rest of his review of symptoms is unremarkable.   Medications: I have reviewed the patient's current medications.  Current Outpatient Prescriptions  Medication Sig Dispense Refill  . ACCU-CHEK FASTCLIX LANCETS MISC     . aspirin 81 MG tablet Take 81 mg by mouth daily.     . bisacodyl (DULCOLAX) 5 MG EC tablet Take 5 mg by mouth at bedtime. 2 at bedtime    . buPROPion (WELLBUTRIN XL) 300 MG 24 hr tablet Take 1 tablet by mouth daily.    . carvedilol (COREG) 3.125 MG tablet Take 3.125 mg by mouth 2 (two) times daily.    . Cholecalciferol (VITAMIN D3) 2000 UNITS  TABS Take 1 tablet by mouth daily.    . clopidogrel (PLAVIX) 75 MG tablet Take 1 tablet by mouth daily.    . furosemide (LASIX) 20 MG tablet Take 20 mg by mouth as needed for fluid.     [name] glimepiride (AMARYL) 2 MG tablet Take 1 mg by mouth daily before breakfast.     . HYDROcodone-acetaminophen (NORCO) 7.5-325 MG tablet Take 1 tablet by mouth every 6 (six) hours as needed (pain). 1 month supply 120 tablet 0  . isosorbide mononitrate (IMDUR) 60 MG 24 hr tablet Take 1 tablet by mouth daily.  1  . KRILL OIL PO Take 1 tablet by mouth daily.     [name] losartan (COZAAR) 100 MG tablet Take 100 mg by mouth daily.    . Magnesium Oxide 400 MG CAPS Take 1 capsule by mouth daily.    . metFORMIN (GLUCOPHAGE) 1000 MG tablet Take 1,000 mg by mouth every 12 (twelve) hours.    [name] morphine (MS CONTIN) 60 MG 12 hr tablet Take 1 tablet (60 mg total) by mouth 3 (three) times daily. 90 tablet 0  . Multiple Vitamin (MULTIVITAMIN) capsule Take 1 capsule by mouth daily. NO IRON    . NITROSTAT 0.4 MG SL tablet Place 0.4 mg under the tongue every 5 (five) minutes as needed for chest pain.     . polyethylene glycol powder (GLYCOLAX/MIRALAX) powder Using measuring cap mix  17gm in water and drink two times daily 3162 g 11  . pravastatin (PRAVACHOL) 80 MG tablet Take 80 mg by mouth daily.    [name]  Psyllium (METAMUCIL PO) Take 17 g/day by mouth 2 (two) times daily.    [name] zolpidem (AMBIEN) 10 MG tablet Take 10 mg by mouth at bedtime as needed for sleep.      No current facility-administered medications for this visit.      Allergies:  Allergies  Allergen Reactions  . Amlodipine Besy-Benazepril Hcl Cough  . Naproxen Other (See Comments)    Worsens Tinnitus    Past Medical History, Surgical history, Social history, and Family History were reviewed and updated.  [name] Physical Exam: Blood pressure 119/73, pulse 82, temperature 98.6 F (37 C), temperature source Oral, resp. rate 19, weight 264 lb 4.8 oz (119.9 kg), SpO2 98  %. ECOG: 1 General appearance: Alert, awake, without distress. Head: Normocephalic, without obvious abnormality no oral ulcers or lesions. Neck: no adenopathy Lymph nodes: Cervical, supraclavicular, and axillary nodes normal. Heart:regular rate and rhythm, S1, S2 normal, no murmur, click, rub or gallop Lung:chest clear, no wheezing, rales, normal symmetric air entry. Abdomin: soft, non-tender, without masses or organomegaly no shifting dullness or ascites. EXT:no erythema, induration, or nodules   Lab Results: Lab Results  Component Value Date   WBC 3.7 (L) 09/02/2015   HGB 13.4 09/02/2015   HCT 40.7 09/02/2015   MCV 87.0 09/02/2015   PLT 105 (L) 09/02/2015     Chemistry      Component Value Date/Time   NA 140 09/02/2015 1013   K 4.5 09/02/2015 1013   CL 105 06/08/2014 0342   CO2 29 09/02/2015 1013   BUN 13.7 09/02/2015 1013   CREATININE 0.9 09/02/2015 1013      Component Value Date/Time   CALCIUM 9.8 09/02/2015 1013   ALKPHOS 70 09/02/2015 1013   AST 43 (H) 09/02/2015 1013   ALT 35 09/02/2015 1013   BILITOT 1.29 (H) 09/02/2015 1013          Results for Daniel Rivers, Daniel Rivers (MRN [id number]) as of 09/05/2015 09:59  Ref. Range 06/03/2015 10:18 06/03/2015 10:18 09/02/2015 10:13  Ferritin Latest Ref Range: 22 - 316 ng/ml 72  68      Impression and Plan:  65 year old gentleman with the following issues:  1. Hemachromatosis: he is currently on phlebotomy every 3 months to keep his ferritin close to 50.   He continues to tolerate this scheduled without major complications. Laboratory data from 09/02/2015 were reviewed today includes his liver function test at baseline with ferritin close to 68. The plan is to continue with the same schedule without any major changes. More frequent phlebotomy will be needed if his ferritin**rise.    2. Cirrhosis of the liver: He follows up with gastroenterology regarding that. His liver function tests are stable without any major changes. He  does not have any stigmata of decompensated liver disease.  3. 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 His platelet count remains above 100,000 without active bleeding. We will continue to monitor this at this time.  4. Coronary artery disease: Status post catheterization and stenting. He is followed by cardiology regarding this issue. No recent complications noted.  5. Follow-up: Will be in 3 months for possible repeat phlebotomy.   [id number], MD 8/3/201710:19 AM

## 2015-09-05 NOTE — Telephone Encounter (Signed)
Gave pt cal & avs °

## 2015-09-05 NOTE — Progress Notes (Signed)
Phlebotomy performed via right forearm using a #18 gauge catheter by Royce Macadamia, RN, 511 ml removed. Patient tolerated procedure well, snack and drink given. Observed for 30 minutes post procedure and vital signs obtained.

## 2015-09-05 NOTE — Patient Instructions (Signed)
Therapeutic Phlebotomy, Care After  Refer to this sheet in the next few weeks. These instructions provide you with information about caring for yourself after your procedure. Your health care provider may also give you more specific instructions. Your treatment has been planned according to current medical practices, but problems sometimes occur. Call your health care provider if you have any problems or questions after your procedure.  WHAT TO EXPECT AFTER THE PROCEDURE  After your procedure, it is common to have:   Light-headedness or dizziness. You may feel faint.   Nausea.   Tiredness.  HOME CARE INSTRUCTIONS  Activities   Return to your normal activities as directed by your health care provider. Most people can go back to their normal activities right away.   Avoid strenuous physical activity and heavy lifting or pulling for about 5 hours after the procedure. Do not lift anything that is heavier than 10 lb (4.5 kg).   Athletes should avoid strenuous exercise for at least 12 hours.   Change positions slowly for the remainder of the day. This will help to prevent light-headedness or fainting.   If you feel light-headed, lie down until the feeling goes away.  Eating and Drinking   Be sure to eat well-balanced meals for the next 24 hours.   Drink enough fluid to keep your urine clear or pale yellow.   Avoid drinking alcohol on the day that you had the procedure.  Care of the Needle Insertion Site   Keep your bandage dry. You can remove the bandage after about 5 hours or as directed by your health care provider.   If you have bleeding from the needle insertion site, elevate your arm and press firmly on the site until the bleeding stops.   If you have bruising at the site, apply ice to the area:   Put ice in a plastic bag.   Place a towel between your skin and the bag.   Leave the ice on for 20 minutes, 2-3 times a day for the first 24 hours.   If the swelling does not go away after 24 hours, apply  a warm, moist washcloth to the area for 20 minutes, 2-3 times a day.  General Instructions   Avoid smoking for at least 30 minutes after the procedure.   Keep all follow-up visits as directed by your health care provider. It is important to continue with further therapeutic phlebotomy treatments as directed.  SEEK MEDICAL CARE IF:   You have redness, swelling, or pain at the needle insertion site.   You have fluid, blood, or pus coming from the needle insertion site.   You feel light-headed, dizzy, or nauseated, and the feeling does not go away.   You notice new bruising at the needle insertion site.   You feel weaker than normal.   You have a fever or chills.  SEEK IMMEDIATE MEDICAL CARE IF:   You have severe nausea or vomiting.   You have chest pain.   You have trouble breathing.    This information is not intended to replace advice given to you by your health care provider. Make sure you discuss any questions you have with your health care provider.    Document Released: 06/23/2010 Document Revised: 06/05/2014 Document Reviewed: 01/15/2014  [employer]vier Interactive Patient Education 2016 [employer]

## 2015-09-12 ENCOUNTER — Ambulatory Visit
Admission: RE | Admit: 2015-09-12 | Discharge: 2015-09-12 | Disposition: A | Discharge status: Home / Self Care | Payer: Medicare Other | Source: Ambulatory Visit | Attending: Gastroenterology | Admitting: Gastroenterology

## 2015-09-12 DIAGNOSIS — K7469 Other cirrhosis of liver: Secondary | ICD-10-CM

## 2015-09-12 DIAGNOSIS — K7460 Unspecified cirrhosis of liver: Secondary | ICD-10-CM | POA: Diagnosis not present

## 2015-10-09 ENCOUNTER — Telehealth: Payer: Self-pay

## 2015-10-09 DIAGNOSIS — M459 Ankylosing spondylitis of unspecified sites in spine: Secondary | ICD-10-CM

## 2015-10-09 NOTE — Telephone Encounter (Signed)
Pt is requesting refills on Hydrocodone and Morphine.

## 2015-10-10 NOTE — Telephone Encounter (Signed)
Please advise 

## 2015-10-10 NOTE — Telephone Encounter (Signed)
Pt will run out of medication by Sunday morning. Can you please fill and sign? Pt has an appt later this month with ZS.

## 2015-10-11 MED ORDER — MORPHINE SULFATE ER 60 MG PO TBCR: 60.0000 mg | EXTENDED_RELEASE_TABLET | Freq: Three times a day (TID) | ORAL | 0 refills | Status: DC

## 2015-10-11 MED ORDER — HYDROCODONE-ACETAMINOPHEN 7.5-325 MG PO TABS: 1.0000 | ORAL_TABLET | Freq: Four times a day (QID) | ORAL | 0 refills | Status: DC | PRN

## 2015-10-11 NOTE — Telephone Encounter (Signed)
rx'es printed out

## 2015-10-11 NOTE — Telephone Encounter (Signed)
Patient called, will pick up

## 2015-10-14 DIAGNOSIS — E1165 Type 2 diabetes mellitus with hyperglycemia: Secondary | ICD-10-CM | POA: Diagnosis not present

## 2015-10-14 DIAGNOSIS — I10 Essential (primary) hypertension: Secondary | ICD-10-CM | POA: Diagnosis not present

## 2015-10-22 ENCOUNTER — Ambulatory Visit: Payer: Medicare Other | Admitting: Physical Medicine & Rehabilitation

## 2015-10-23 DIAGNOSIS — E7800 Pure hypercholesterolemia, unspecified: Secondary | ICD-10-CM | POA: Diagnosis not present

## 2015-10-23 DIAGNOSIS — E1165 Type 2 diabetes mellitus with hyperglycemia: Secondary | ICD-10-CM | POA: Diagnosis not present

## 2015-10-23 DIAGNOSIS — Z0000 Encounter for general adult medical examination without abnormal findings: Secondary | ICD-10-CM | POA: Diagnosis not present

## 2015-10-23 DIAGNOSIS — I10 Essential (primary) hypertension: Secondary | ICD-10-CM | POA: Diagnosis not present

## 2015-10-31 DIAGNOSIS — H2513 Age-related nuclear cataract, bilateral: Secondary | ICD-10-CM | POA: Diagnosis not present

## 2015-10-31 DIAGNOSIS — H40013 Open angle with borderline findings, low risk, bilateral: Secondary | ICD-10-CM | POA: Diagnosis not present

## 2015-11-06 ENCOUNTER — Encounter: Payer: Medicare Other | Attending: Physical Medicine & Rehabilitation | Admitting: Physical Medicine & Rehabilitation

## 2015-11-06 ENCOUNTER — Encounter: Payer: Self-pay | Admitting: Physical Medicine & Rehabilitation

## 2015-11-06 VITALS — BP 126/77 | HR 91 | Resp 14

## 2015-11-06 DIAGNOSIS — K449 Diaphragmatic hernia without obstruction or gangrene: Secondary | ICD-10-CM | POA: Diagnosis not present

## 2015-11-06 DIAGNOSIS — M109 Gout, unspecified: Secondary | ICD-10-CM | POA: Insufficient documentation

## 2015-11-06 DIAGNOSIS — M7522 Bicipital tendinitis, left shoulder: Secondary | ICD-10-CM | POA: Insufficient documentation

## 2015-11-06 DIAGNOSIS — I10 Essential (primary) hypertension: Secondary | ICD-10-CM | POA: Insufficient documentation

## 2015-11-06 DIAGNOSIS — F329 Major depressive disorder, single episode, unspecified: Secondary | ICD-10-CM | POA: Diagnosis not present

## 2015-11-06 DIAGNOSIS — G894 Chronic pain syndrome: Secondary | ICD-10-CM | POA: Diagnosis not present

## 2015-11-06 DIAGNOSIS — K219 Gastro-esophageal reflux disease without esophagitis: Secondary | ICD-10-CM | POA: Insufficient documentation

## 2015-11-06 DIAGNOSIS — E119 Type 2 diabetes mellitus without complications: Secondary | ICD-10-CM | POA: Diagnosis not present

## 2015-11-06 DIAGNOSIS — G8929 Other chronic pain: Secondary | ICD-10-CM | POA: Insufficient documentation

## 2015-11-06 DIAGNOSIS — Z79899 Other long term (current) drug therapy: Secondary | ICD-10-CM | POA: Diagnosis not present

## 2015-11-06 DIAGNOSIS — Z87891 Personal history of nicotine dependence: Secondary | ICD-10-CM | POA: Diagnosis not present

## 2015-11-06 DIAGNOSIS — M459 Ankylosing spondylitis of unspecified sites in spine: Secondary | ICD-10-CM

## 2015-11-06 DIAGNOSIS — E7800 Pure hypercholesterolemia, unspecified: Secondary | ICD-10-CM | POA: Diagnosis not present

## 2015-11-06 DIAGNOSIS — I2510 Atherosclerotic heart disease of native coronary artery without angina pectoris: Secondary | ICD-10-CM | POA: Insufficient documentation

## 2015-11-06 DIAGNOSIS — F419 Anxiety disorder, unspecified: Secondary | ICD-10-CM | POA: Diagnosis not present

## 2015-11-06 DIAGNOSIS — Z955 Presence of coronary angioplasty implant and graft: Secondary | ICD-10-CM | POA: Diagnosis not present

## 2015-11-06 DIAGNOSIS — M1990 Unspecified osteoarthritis, unspecified site: Secondary | ICD-10-CM | POA: Diagnosis not present

## 2015-11-06 DIAGNOSIS — M545 Low back pain: Secondary | ICD-10-CM | POA: Diagnosis present

## 2015-11-06 DIAGNOSIS — M67912 Unspecified disorder of synovium and tendon, left shoulder: Secondary | ICD-10-CM

## 2015-11-06 DIAGNOSIS — M452 Ankylosing spondylitis of cervical region: Secondary | ICD-10-CM | POA: Insufficient documentation

## 2015-11-06 DIAGNOSIS — M456 Ankylosing spondylitis lumbar region: Secondary | ICD-10-CM | POA: Diagnosis not present

## 2015-11-06 DIAGNOSIS — N400 Enlarged prostate without lower urinary tract symptoms: Secondary | ICD-10-CM | POA: Diagnosis not present

## 2015-11-06 DIAGNOSIS — M455 Ankylosing spondylitis of thoracolumbar region: Secondary | ICD-10-CM

## 2015-11-06 DIAGNOSIS — Z5181 Encounter for therapeutic drug level monitoring: Secondary | ICD-10-CM

## 2015-11-06 DIAGNOSIS — E669 Obesity, unspecified: Secondary | ICD-10-CM | POA: Diagnosis not present

## 2015-11-06 MED ORDER — MORPHINE SULFATE ER 60 MG PO TBCR: 60.0000 mg | EXTENDED_RELEASE_TABLET | Freq: Three times a day (TID) | ORAL | 0 refills | Status: DC

## 2015-11-06 MED ORDER — HYDROCODONE-ACETAMINOPHEN 7.5-325 MG PO TABS: 1.0000 | ORAL_TABLET | Freq: Four times a day (QID) | ORAL | 0 refills | Status: DC | PRN

## 2015-11-06 NOTE — Patient Instructions (Signed)
PLEASE CALL ME WITH ANY PROBLEMS OR QUESTIONS ([phone])  

## 2015-11-06 NOTE — Progress Notes (Signed)
Subjective:    Patient ID: Daniel Rivers, male    DOB: [birth year], 65 y.o.   MRN: [id number]  HPI   Daniel Rivers here regarding his chronic pain. He states his pain is stable. He had an attack of gout which he has since gotten over.   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 He tries to exercise when he can.   His ms contin and hydrocodone remain effective for pain control. He is compliant with our instructions and counts have been appropriate.    Pain Inventory Average Pain 6 Pain Right Now 5 My pain is sharp, burning, dull, tingling and aching  In the last 24 hours, has pain interfered with the following? General activity 6 Relation with others 4 Enjoyment of life 7 What TIME of day is your pain at its worst? evening Sleep (in general) Fair  Pain is worse with: all Pain improves with: rest, heat/ice, therapy/exercise, pacing activities and medication Relief from Meds: 5  Mobility Do you have any goals in this area?  no  Function Do you have any goals in this area?  no  Neuro/Psych weakness numbness tingling trouble walking loss of taste or smell  Prior Studies Any changes since last visit?  no  Physicians involved in your care Any changes since last visit?  no   No family history on file. Social History   Social History  . Marital status: Widowed    Spouse name: N/A  . Number of children: N/A  . Years of education: N/A   Social History Main Topics  . Smoking status: Former Smoker    Packs/day: 5.00    Years: 23.00    Types: Cigarettes    Quit date: 06/16/1986  . Smokeless tobacco: Never Used  . Alcohol use Yes     Comment: 05/22/2014  "used to drink casually; never had problem w/it; none since ~ 2004"  . Drug use: No  . Sexual activity: No   Other Topics Concern  . None   Social History Narrative  . None   Past Surgical History:  Procedure Laterality Date  . ANAL FISSURE REPAIR    . ANKLE FRACTURE SURGERY Left   . BACK SURGERY    .  BONE TISSUE     "I've had removal of excess tissue as a result of my ankylosing spondylitis"  . CARDIAC CATHETERIZATION N/A 06/07/2014   Procedure: Coronary Stent Intervention;  Surgeon: Daniel Prows, MD;  Location: [facility] CV LAB CUPID;  Service: Cardiovascular;  Laterality: N/A;  . CARPAL TUNNEL RELEASE Bilateral   . COLON SURGERY    . CORONARY ANGIOPLASTY WITH STENT PLACEMENT  2009; 05/22/2014   ?3; ?1  . cortisone injection    . FRACTURE SURGERY    . LEFT HEART CATHETERIZATION WITH CORONARY ANGIOGRAM N/A 05/22/2014   Procedure: LEFT HEART CATHETERIZATION WITH CORONARY ANGIOGRAM;  Surgeon: Daniel Prows, MD;  Location: [facility] CATH LAB;  Service: Cardiovascular;  Laterality: N/A;  . LUMBAR DISC SURGERY  X 2   "ruptured/herniated/growing together"  . PERCUTANEOUS CORONARY STENT INTERVENTION (PCI-S)  05/22/2014   Procedure: PERCUTANEOUS CORONARY STENT INTERVENTION (PCI-S);  Surgeon: Daniel Prows, MD;  Location: [facility] CATH LAB;  Service: Cardiovascular;;  mid LAD DES  . PERCUTANEOUS CORONARY STENT INTERVENTION (PCI-S)  06/07/2014   des to rca  . SHOULDER ARTHROSCOPY W/ ROTATOR CUFF REPAIR Bilateral   . SMALL INTESTINE SURGERY  1960   "took out 14 inches"  . ULNAR TUNNEL RELEASE Left  Past Medical History:  Diagnosis Date  . Ankylosing spondylitis of cervical region ([facility])   . Ankylosing spondylitis of lumbar region ([facility])   . Anxiety   . BPH (benign prostatic hypertrophy)   . Bursitis, trochanteric   . Chronic back pain   . Chronic cervical pain   . Coronary artery disease   . Daily headache    "recently; related to RX for my heart" (05/21/2014)  . Depression   . GERD (gastroesophageal reflux disease)   . Hemochromatosis   . High cholesterol   . History of hiatal hernia   . Hypertension   . Non-alcoholic cirrhosis ([facility])    "from the hemochromatosis"  . Obesity   . Osteoarthritis    "joints" (05/20/2104)  . Rotator cuff syndrome   . Tendonitis   . Type II diabetes mellitus ([facility])    BP  126/77   Pulse 91   Resp 14   SpO2 96%   Opioid Risk Score:   Fall Risk Score:  `1  Depression screen PHQ 2/9  Depression screen [facility] 2/9 07/22/2015 04/22/2015 05/08/2014 02/09/2014 02/09/2014 11/08/2013  Decreased Interest 0 2 2 0 0 0  Down, Depressed, Hopeless 0 0 0 0 0 0  PHQ - 2 Score 0 2 2 0 0 0  Altered sleeping - - 0 - - -  Tired, decreased energy - - 3 - - -  Change in appetite - - 0 - - -  Feeling bad or failure about yourself  - - 1 - - -  Trouble concentrating - - 1 - - -  Moving slowly or fidgety/restless - - 0 - - -  Suicidal thoughts - - 0 - - -  PHQ-9 Score - - 7 - - -     Review of Systems  All other systems reviewed and are negative.      Objective:   Physical Exam  Constitutional: He is oriented to person, place, and time. He appears well-developed and well-nourished. He has lost a little weight.  HENT:  Head: Normocephalic and atraumatic.  Nose: Nose normal.  Mouth/Throat: Oropharynx is clear and moist.  Eyes: Conjunctivae are normal. Pupils are equal, round, and reactive to light.  Cardiovascular: Normal rate and regular rhythm.  Pulmonary/Chest: Effort normal. No respiratory distress. He has no wheezes.  Abdominal: He exhibits no distension. There is no tenderness.  Musculoskeletal:  Left shoulder: He exhibits consistently decreased range of motion, tenderness and bony tenderness.  Lumbar back: forward flexed---can extend to neutral only.  Cervical: head forward position. Cannot extend to neutral. pecs tight as are shoulders.  Neurological: He is alert and oriented to person, place, and time.  He is able to bend and flex at the waist and go to 90 degrees. Still cannont extend neck to neutral (-10 degrees).uses a cane for balance with good control..   Assessment & Plan:   ASSESSMENT:  1. 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  2. Left and right greater trochanter bursitis.  3. Bilateral rotator cuff syndrome.  4. Left bicipital  tendonitis.  5. Hypo-testosterone state    PLAN:  1. Continue with HEP to improve shoulder and chest ROM to secondarily improve the neck ROM..              -stable   2. Current medication regimen is still effective. Refilled morphine  60 mg CR q.8 hours #90 and Norco 7.5, #90 with a second rx for next month. He may come pick up an  RX for month 3.  3. Effient per cardiology  4. He will follow up in 3 months. All questions were encouraged and answered. Fifteen minutes of face to face patient care time were spent during this visit. All questions were encouraged and answered.

## 2015-11-14 LAB — 6-ACETYLMORPHINE,TOXASSURE ADD
6-ACETYLMORPHINE: NEGATIVE
6-ACETYLMORPHINE: NOT DETECTED ng/mg{creat}

## 2015-11-14 LAB — TOXASSURE SELECT,+ANTIDEPR,UR

## 2015-11-18 NOTE — Progress Notes (Signed)
Urine drug screen for this encounter is consistent for prescribed medication 

## 2015-12-06 ENCOUNTER — Other Ambulatory Visit (HOSPITAL_BASED_OUTPATIENT_CLINIC_OR_DEPARTMENT_OTHER): Payer: Medicare Other

## 2015-12-06 DIAGNOSIS — D6959 Other secondary thrombocytopenia: Secondary | ICD-10-CM | POA: Diagnosis not present

## 2015-12-06 DIAGNOSIS — Z862 Personal history of diseases of the blood and blood-forming organs and certain disorders involving the immune mechanism: Secondary | ICD-10-CM

## 2015-12-06 LAB — CBC WITH DIFFERENTIAL/PLATELET
BASO%: 0.7 % (ref 0.0–2.0)
BASOS ABS: 0 10*3/uL (ref 0.0–0.1)
EOS ABS: 0.1 10*3/uL (ref 0.0–0.5)
EOS%: 1.6 % (ref 0.0–7.0)
HCT: 42.6 % (ref 38.4–49.9)
HGB: 13.9 g/dL (ref 13.0–17.1)
LYMPH%: 28.8 % (ref 14.0–49.0)
MCH: 29 pg (ref 27.2–33.4)
MCHC: 32.7 g/dL (ref 32.0–36.0)
MCV: 88.6 fL (ref 79.3–98.0)
MONO#: 0.4 10*3/uL (ref 0.1–0.9)
MONO%: 10.5 % (ref 0.0–14.0)
NEUT#: 2.5 10*3/uL (ref 1.5–6.5)
NEUT%: 58.4 % (ref 39.0–75.0)
Platelets: 103 10*3/uL — ABNORMAL LOW (ref 140–400)
RBC: 4.81 10*6/uL (ref 4.20–5.82)
RDW: 14 % (ref 11.0–14.6)
WBC: 4.2 10*3/uL (ref 4.0–10.3)
lymph#: 1.2 10*3/uL (ref 0.9–3.3)

## 2015-12-06 LAB — COMPREHENSIVE METABOLIC PANEL
ALK PHOS: 77 U/L (ref 40–150)
ALT: 60 U/L — ABNORMAL HIGH (ref 0–55)
AST: 56 U/L — ABNORMAL HIGH (ref 5–34)
Albumin: 3.6 g/dL (ref 3.5–5.0)
Anion Gap: 10 mEq/L (ref 3–11)
BUN: 11.8 mg/dL (ref 7.0–26.0)
CHLORIDE: 101 meq/L (ref 98–109)
CO2: 28 meq/L (ref 22–29)
Calcium: 9.4 mg/dL (ref 8.4–10.4)
Creatinine: 0.8 mg/dL (ref 0.7–1.3)
GLUCOSE: 162 mg/dL — AB (ref 70–140)
POTASSIUM: 4.1 meq/L (ref 3.5–5.1)
SODIUM: 139 meq/L (ref 136–145)
Total Bilirubin: 1.28 mg/dL — ABNORMAL HIGH (ref 0.20–1.20)
Total Protein: 7.3 g/dL (ref 6.4–8.3)

## 2015-12-06 LAB — FERRITIN: Ferritin: 66 ng/ml (ref 22–316)

## 2015-12-06 LAB — IRON AND TIBC
%SAT: 21 % (ref 20–55)
Iron: 74 ug/dL (ref 42–163)
TIBC: 358 ug/dL (ref 202–409)
UIBC: 284 ug/dL (ref 117–376)

## 2015-12-11 ENCOUNTER — Telehealth: Payer: Self-pay | Admitting: *Deleted

## 2015-12-11 ENCOUNTER — Ambulatory Visit (HOSPITAL_BASED_OUTPATIENT_CLINIC_OR_DEPARTMENT_OTHER): Payer: Medicare Other | Admitting: Oncology

## 2015-12-11 ENCOUNTER — Ambulatory Visit (HOSPITAL_BASED_OUTPATIENT_CLINIC_OR_DEPARTMENT_OTHER): Payer: Medicare Other

## 2015-12-11 ENCOUNTER — Telehealth: Payer: Self-pay | Admitting: Oncology

## 2015-12-11 VITALS — BP 121/59 | HR 89 | Temp 97.9°F | Resp 20 | Ht 76.0 in | Wt 264.6 lb

## 2015-12-11 DIAGNOSIS — K7460 Unspecified cirrhosis of liver: Secondary | ICD-10-CM | POA: Diagnosis not present

## 2015-12-11 DIAGNOSIS — D6959 Other secondary thrombocytopenia: Secondary | ICD-10-CM | POA: Diagnosis not present

## 2015-12-11 DIAGNOSIS — Z862 Personal history of diseases of the blood and blood-forming organs and certain disorders involving the immune mechanism: Secondary | ICD-10-CM

## 2015-12-11 NOTE — Telephone Encounter (Signed)
Message sent to chemo scheduler to be added per 12/11/15 los. Appointments scheduled per 12/11/15 los. AVS report and appointment schedule given to patient, per 12/11/15 los.

## 2015-12-11 NOTE — Progress Notes (Signed)
Hematology and Oncology Follow Up Visit  Daniel Rivers [id number] 14-Jul-1950 65 y.o. 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 He established care at the [facility] in August of 2014. 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   Prior Therapy: He have been receiving intermittent phlebotomies in the past.  Current therapy: Every 3 month phlebotomy to achieve ferritin level close to 50.  Interim History:  Daniel Rivers presents today for a followup visit. Since his last visit, he reports feeling reasonably well without any recent complaints. He continues to receive phlebotomy every 3 months to keep his ferritin close to 50 and have tolerated this schedule well. He denied any recent cardiac complaints such as chest pain, dyspnea on exertion or shortness of breath. He denied any complications related to phlebotomy including falls, syncope or dizziness.   He remains reasonably active and attends to activities of daily living. He does have chronic pain syndrome and his pain is reasonably managed on the current pain medication.  He has not reported any headaches or blurry vision or alteration of his mental status. Has not had any genitourinary complaints of frequency urgency or hesitancy. Has not reported any lymphadenopathy or petechiae. Has not reported any easy bruisability or any bleeding. Report any new skeletal complaints. The rest of his review of symptoms is unremarkable.   Medications: I have reviewed the patient's current medications.  Current Outpatient Prescriptions  Medication Sig Dispense Refill  . ACCU-CHEK FASTCLIX LANCETS MISC     . aspirin 81 MG tablet Take 81 mg by mouth daily.     . bisacodyl (DULCOLAX) 5 MG EC tablet Take 5 mg by mouth at bedtime. 2 at bedtime    . buPROPion (WELLBUTRIN XL) 300 MG 24 hr tablet Take 1 tablet by  mouth daily.    . carvedilol (COREG) 3.125 MG tablet Take 3.125 mg by mouth 2 (two) times daily.    . Cholecalciferol (VITAMIN D3) 2000 UNITS TABS Take 1 tablet by mouth daily.    . clopidogrel (PLAVIX) 75 MG tablet Take 1 tablet by mouth daily.    . furosemide (LASIX) 20 MG tablet Take 20 mg by mouth as needed for fluid.     [name] glimepiride (AMARYL) 2 MG tablet Take 1 mg by mouth daily before breakfast.     . HYDROcodone-acetaminophen (NORCO) 7.5-325 MG tablet Take 1 tablet by mouth every 6 (six) hours as needed (pain). 1 month supply 120 tablet 0  . indomethacin (INDOCIN) 25 MG capsule     . isosorbide mononitrate (IMDUR) 60 MG 24 hr tablet Take 1 tablet by mouth daily.  1  . KRILL OIL PO Take 1 tablet by mouth daily.     [name] losartan (COZAAR) 100 MG tablet Take 100 mg by mouth daily.    . Magnesium Oxide 400 MG CAPS Take 1 capsule by mouth daily.    . metFORMIN (GLUCOPHAGE) 1000 MG tablet Take 1,000 mg by mouth every 12 (twelve) hours.    [name] morphine (MS CONTIN) 60 MG 12 hr tablet Take 1 tablet (60 mg total) by mouth 3 (three) times daily. 90 tablet 0  . Multiple Vitamin (MULTIVITAMIN) capsule Take 1 capsule by mouth daily. NO IRON    . NITROSTAT 0.4 MG SL tablet Place 0.4 mg under the tongue every 5 (five) minutes as needed for chest pain.     . polyethylene glycol  powder (GLYCOLAX/MIRALAX) powder Using measuring cap mix  17gm in water and drink two times daily 3162 g 11  . pravastatin (PRAVACHOL) 80 MG tablet Take 80 mg by mouth daily.    . Psyllium (METAMUCIL PO) Take 17 g/day by mouth 2 (two) times daily.    [name] zolpidem (AMBIEN) 10 MG tablet Take 10 mg by mouth at bedtime as needed for sleep.      No current facility-administered medications for this visit.      Allergies:  Allergies  Allergen Reactions  . Amlodipine Besy-Benazepril Hcl Cough  . Naproxen Other (See Comments)    Worsens Tinnitus    Past Medical History, Surgical history, Social history, and Family History were  reviewed and updated.  [name] Physical Exam: Blood pressure (!) 121/59, pulse 89, temperature 97.9 F (36.6 C), temperature source Oral, resp. rate 20, height 6\' 4"  (1.93 m), weight 264 lb 9.6 oz (120 kg), SpO2 99 %. ECOG: 1 General appearance: Pleasant-appearing gentleman without distress. Head: Normocephalic, without obvious abnormality no oral thrush noted. Neck: no adenopathy Lymph nodes: Cervical, supraclavicular, and axillary nodes normal. Heart:regular rate and rhythm, S1, S2 normal, no murmur, click, rub or gallop Lung:chest clear, no wheezing, rales, normal symmetric air entry. Abdomin: soft, non-tender, without masses or organomegaly no rebound or guarding. EXT:no erythema, induration, or nodules   Lab Results: Lab Results  Component Value Date   WBC 4.2 12/06/2015   HGB 13.9 12/06/2015   HCT 42.6 12/06/2015   MCV 88.6 12/06/2015   PLT 103 (L) 12/06/2015     Chemistry      Component Value Date/Time   NA 139 12/06/2015 0836   K 4.1 12/06/2015 0836   CL 105 06/08/2014 0342   CO2 28 12/06/2015 0836   BUN 11.8 12/06/2015 0836   CREATININE 0.8 12/06/2015 0836      Component Value Date/Time   CALCIUM 9.4 12/06/2015 0836   ALKPHOS 77 12/06/2015 0836   AST 56 (H) 12/06/2015 0836   ALT 60 (H) 12/06/2015 0836   BILITOT 1.28 (H) 12/06/2015 0836       Results for Daniel Rivers (MRN [id number]) as of 12/11/2015 09:51  Ref. Range 06/03/2015 10:18 09/02/2015 10:13 12/06/2015 08:36  Ferritin Latest Ref Range: 22 - 316 ng/ml 72 68 66       Impression and Plan:  65 year old gentleman with the following issues:  1. Hemachromatosis: He has double heterozygous mutation with the risk of iron overload is reasonably well. He is currently on phlebotomy every 3 months to keep his ferritin close to 50.   His last ferritin was 66 and risks and benefits of continuing this procedure was discussed and is agreeable to continue. He will continue to receive phlebotomy every 3 months as  long as his ferritin is close to 50.  2. Cirrhosis of the liver: He follows up with gastroenterology regarding that. His liver function tests are stable without any major changes. He does not have any stigmata of decompensated liver disease.  3. 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 No active bleeding noted and his platelet count remains above 100.  4. Coronary artery disease: Status post catheterization and stenting. He is followed by cardiology regarding this issue. No recent complications noted.  5. Follow-up: Will be in 3 months for possible repeat phlebotomy.   [id number], MD 11/8/201710:00 AM

## 2015-12-11 NOTE — Telephone Encounter (Signed)
Per LOS I have scheduled appt and notified the scheduler 

## 2015-12-11 NOTE — Addendum Note (Signed)
Addended by: Randolm Idol on: 12/11/2015 10:28 AM   Modules accepted: Orders

## 2015-12-11 NOTE — Patient Instructions (Signed)

## 2016-01-07 ENCOUNTER — Telehealth: Payer: Self-pay | Admitting: *Deleted

## 2016-01-07 DIAGNOSIS — M455 Ankylosing spondylitis of thoracolumbar region: Secondary | ICD-10-CM

## 2016-01-07 MED ORDER — HYDROCODONE-ACETAMINOPHEN 7.5-325 MG PO TABS: 1.0000 | ORAL_TABLET | Freq: Four times a day (QID) | ORAL | 0 refills | Status: DC | PRN

## 2016-01-07 MED ORDER — MORPHINE SULFATE ER 60 MG PO TBCR: 60.0000 mg | EXTENDED_RELEASE_TABLET | Freq: Three times a day (TID) | ORAL | 0 refills | Status: DC

## 2016-01-07 NOTE — Telephone Encounter (Signed)
Printed Rx for Dr Naaman Plummer to sign

## 2016-01-07 NOTE — Telephone Encounter (Signed)
Requesting morphine and hydrocodone refills.  Has appt 02/05/15

## 2016-01-09 NOTE — Telephone Encounter (Signed)
Made multiple attempts to reach patient on his home phone, phone was busy.  Contacted pt's son via emergency contact list and spoke with him. Informed that his father has prescriptions available for pick up at our location

## 2016-02-05 ENCOUNTER — Encounter: Payer: Medicare Other | Attending: Physical Medicine & Rehabilitation | Admitting: Physical Medicine & Rehabilitation

## 2016-02-05 ENCOUNTER — Encounter: Payer: Self-pay | Admitting: Physical Medicine & Rehabilitation

## 2016-02-05 VITALS — BP 116/79 | HR 95 | Resp 14

## 2016-02-05 DIAGNOSIS — M455 Ankylosing spondylitis of thoracolumbar region: Secondary | ICD-10-CM | POA: Diagnosis not present

## 2016-02-05 DIAGNOSIS — M545 Low back pain: Secondary | ICD-10-CM | POA: Diagnosis present

## 2016-02-05 DIAGNOSIS — E7800 Pure hypercholesterolemia, unspecified: Secondary | ICD-10-CM | POA: Diagnosis not present

## 2016-02-05 DIAGNOSIS — G8929 Other chronic pain: Secondary | ICD-10-CM | POA: Insufficient documentation

## 2016-02-05 DIAGNOSIS — Z87891 Personal history of nicotine dependence: Secondary | ICD-10-CM | POA: Diagnosis not present

## 2016-02-05 DIAGNOSIS — Z79899 Other long term (current) drug therapy: Secondary | ICD-10-CM | POA: Diagnosis not present

## 2016-02-05 DIAGNOSIS — M452 Ankylosing spondylitis of cervical region: Secondary | ICD-10-CM | POA: Insufficient documentation

## 2016-02-05 DIAGNOSIS — N400 Enlarged prostate without lower urinary tract symptoms: Secondary | ICD-10-CM | POA: Insufficient documentation

## 2016-02-05 DIAGNOSIS — M456 Ankylosing spondylitis lumbar region: Secondary | ICD-10-CM | POA: Diagnosis not present

## 2016-02-05 DIAGNOSIS — I2510 Atherosclerotic heart disease of native coronary artery without angina pectoris: Secondary | ICD-10-CM | POA: Insufficient documentation

## 2016-02-05 DIAGNOSIS — Z955 Presence of coronary angioplasty implant and graft: Secondary | ICD-10-CM | POA: Diagnosis not present

## 2016-02-05 DIAGNOSIS — F419 Anxiety disorder, unspecified: Secondary | ICD-10-CM | POA: Insufficient documentation

## 2016-02-05 DIAGNOSIS — K219 Gastro-esophageal reflux disease without esophagitis: Secondary | ICD-10-CM | POA: Diagnosis not present

## 2016-02-05 DIAGNOSIS — M109 Gout, unspecified: Secondary | ICD-10-CM | POA: Diagnosis not present

## 2016-02-05 DIAGNOSIS — I10 Essential (primary) hypertension: Secondary | ICD-10-CM | POA: Insufficient documentation

## 2016-02-05 DIAGNOSIS — K449 Diaphragmatic hernia without obstruction or gangrene: Secondary | ICD-10-CM | POA: Diagnosis not present

## 2016-02-05 DIAGNOSIS — E119 Type 2 diabetes mellitus without complications: Secondary | ICD-10-CM | POA: Diagnosis not present

## 2016-02-05 DIAGNOSIS — E669 Obesity, unspecified: Secondary | ICD-10-CM | POA: Diagnosis not present

## 2016-02-05 DIAGNOSIS — F329 Major depressive disorder, single episode, unspecified: Secondary | ICD-10-CM | POA: Insufficient documentation

## 2016-02-05 DIAGNOSIS — M67911 Unspecified disorder of synovium and tendon, right shoulder: Secondary | ICD-10-CM | POA: Diagnosis not present

## 2016-02-05 DIAGNOSIS — M67912 Unspecified disorder of synovium and tendon, left shoulder: Secondary | ICD-10-CM

## 2016-02-05 DIAGNOSIS — M1990 Unspecified osteoarthritis, unspecified site: Secondary | ICD-10-CM | POA: Insufficient documentation

## 2016-02-05 DIAGNOSIS — M7522 Bicipital tendinitis, left shoulder: Secondary | ICD-10-CM | POA: Insufficient documentation

## 2016-02-05 DIAGNOSIS — Z5181 Encounter for therapeutic drug level monitoring: Secondary | ICD-10-CM

## 2016-02-05 MED ORDER — MORPHINE SULFATE ER 60 MG PO TBCR: 60.0000 mg | EXTENDED_RELEASE_TABLET | Freq: Three times a day (TID) | ORAL | 0 refills | Status: DC

## 2016-02-05 MED ORDER — HYDROCODONE-ACETAMINOPHEN 7.5-325 MG PO TABS: 1.0000 | ORAL_TABLET | Freq: Four times a day (QID) | ORAL | 0 refills | Status: DC | PRN

## 2016-02-05 NOTE — Patient Instructions (Signed)
PLEASE CALL ME WITH ANY PROBLEMS OR QUESTIONS ([phone])  

## 2016-02-05 NOTE — Progress Notes (Signed)
Subjective:    Patient ID: Daniel Rivers, male    DOB: 06/07/1950, 66 y.o.   MRN: [id number]  HPI   Mr. Shrewsbury is here in follow up of his chronic pain. He just lost his step son last week who apparently died from consequences of ETOH abuse. The man had taken a few of his medications as well including several of his MS contin and hydrocodone from his weekly med Environmental education officer.   Previously, he had been doing fairly well. But obviously, the events of last week affected him negatively.      Pain Inventory Average Pain 4 Pain Right Now 6 My pain is constant, sharp, burning, dull, stabbing, tingling and aching  In the last 24 hours, has pain interfered with the following? General activity 5 Relation with others 4 Enjoyment of life 6 What TIME of day is your pain at its worst? evening Sleep (in general) Fair  Pain is worse with: walking, bending, sitting, inactivity, standing and some activites Pain improves with: rest, therapy/exercise, pacing activities and medication Relief from Meds: 5  Mobility Do you have any goals in this area?  no  Function Do you have any goals in this area?  no  Neuro/Psych weakness numbness tingling trouble walking loss of taste or smell  Prior Studies Any changes since last visit?  no  Physicians involved in your care Any changes since last visit?  no   History reviewed. No pertinent family history. Social History   Social History  . Marital status: Widowed    Spouse name: N/A  . Number of children: N/A  . Years of education: N/A   Social History Main Topics  . Smoking status: Former Smoker    Packs/day: 5.00    Years: 23.00    Types: Cigarettes    Quit date: 06/16/1986  . Smokeless tobacco: Never Used  . Alcohol use Yes     Comment: 05/22/2014  "used to drink casually; never had problem w/it; none since ~ 2004"  . Drug use: No  . Sexual activity: No   Other Topics Concern  . None   Social History Narrative  . None    Past Surgical History:  Procedure Laterality Date  . ANAL FISSURE REPAIR    . ANKLE FRACTURE SURGERY Left   . BACK SURGERY    . BONE TISSUE     "I've had removal of excess tissue as a result of my ankylosing spondylitis"  . CARDIAC CATHETERIZATION N/A 06/07/2014   Procedure: Coronary Stent Intervention;  Surgeon: Adrian Prows, MD;  Location: [facility];  Service: Cardiovascular;  Laterality: N/A;  . CARPAL TUNNEL RELEASE Bilateral   . COLON SURGERY    . CORONARY ANGIOPLASTY WITH STENT PLACEMENT  2009; 05/22/2014   ?3; ?1  . cortisone injection    . FRACTURE SURGERY    . LEFT HEART CATHETERIZATION WITH CORONARY ANGIOGRAM N/A 05/22/2014   Procedure: LEFT HEART CATHETERIZATION WITH CORONARY ANGIOGRAM;  Surgeon: Adrian Prows, MD;  Location: [facility] CATH LAB;  Service: Cardiovascular;  Laterality: N/A;  . LUMBAR DISC SURGERY  X 2   "ruptured/herniated/growing together"  . PERCUTANEOUS CORONARY STENT INTERVENTION (PCI-S)  05/22/2014   Procedure: PERCUTANEOUS CORONARY STENT INTERVENTION (PCI-S);  Surgeon: Adrian Prows, MD;  Location: [facility] CATH LAB;  Service: Cardiovascular;;  mid LAD DES  . PERCUTANEOUS CORONARY STENT INTERVENTION (PCI-S)  06/07/2014   des to rca  . SHOULDER ARTHROSCOPY W/ ROTATOR CUFF REPAIR Bilateral   . SMALL INTESTINE SURGERY  1960   "took out 14 inches"  . ULNAR TUNNEL RELEASE Left    Past Medical History:  Diagnosis Date  . Ankylosing spondylitis of cervical region ([place])   . Ankylosing spondylitis of lumbar region ([place])   . Anxiety   . BPH (benign prostatic hypertrophy)   . Bursitis, trochanteric   . Chronic back pain   . Chronic cervical pain   . Coronary artery disease   . Daily headache    "recently; related to RX for my heart" (05/21/2014)  . Depression   . GERD (gastroesophageal reflux disease)   . Hemochromatosis   . High cholesterol   . History of hiatal hernia   . Hypertension   . 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  Obesity   . Osteoarthritis    "joints" (05/20/2104)  . Rotator cuff syndrome   . Tendonitis   . Type II diabetes mellitus (HCC)    BP 116/79   Pulse 95   Resp 14   SpO2 96%   Opioid Risk Score:   Fall Risk Score:  `1  Depression screen PHQ 2/9  Depression screen [facility] 2/9 07/22/2015 04/22/2015 05/08/2014 02/09/2014 02/09/2014 11/08/2013  Decreased Interest 0 2 2 0 0 0  Down, Depressed, Hopeless 0 0 0 0 0 0  PHQ - 2 Score 0 2 2 0 0 0  Altered sleeping - - 0 - - -  Tired, decreased energy - - 3 - - -  Change in appetite - - 0 - - -  Feeling bad or failure about yourself  - - 1 - - -  Trouble concentrating - - 1 - - -  Moving slowly or fidgety/restless - - 0 - - -  Suicidal thoughts - - 0 - - -  PHQ-9 Score - - 7 - - -    Review of Systems  HENT: Negative.   Eyes: Negative.   Respiratory: Negative.   Cardiovascular: Negative.   Gastrointestinal: Negative.   Endocrine: Negative.   Genitourinary: Negative.   Musculoskeletal: Positive for gait problem.  Skin: Negative.   Allergic/Immunologic: Negative.   Neurological: Positive for weakness and numbness.       Tingling  Hematological: Negative.   Psychiatric/Behavioral: Negative.   All other systems reviewed and are negative.      Objective:   Physical Exam  Constitutional: He is oriented to person, place, and time. He appears well-developed and well-nourished. He has lost a little weight.  HENT:  Head: Normocephalic and atraumatic.  Nose: Nose normal.  Mouth/Throat: Oropharynx is clear and moist.  Eyes: Conjunctivae are normal. Pupils are equal, round, and reactive to light.  Cardiovascular: RRR  Pulmonary/Chest: Clear without wheezes.  Abdominal: He exhibits no distension. There is no tenderness.  Musculoskeletal:  Left shoulder: limited abduction,ER/IR.  Lumbar back: forward flexed posture.  Cervical: head forward position. Cannot extend to neutral. pecs tight as are shoulders.  Neurological: He is alert and oriented  to person, place, and time.  He is able to bend and flex at the waist and go to 90 degrees. Still cannont extend neck to neutral (-10 degrees).uses a cane for balance with good control..   Assessment & Plan:  ASSESSMENT:  1. 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  2. Left and right greater trochanter bursitis.  3. Bilateral rotator cuff syndrome.  4. Left bicipital tendonitis.  5. Hypo-testosterone state    PLAN:  1. Continue with HEP to improve shoulder and chest  ROM to secondarily improve the neck ROM..  -stable   2. Current medication regimen is still effective. Refilled morphine  60 mg CR q.8 hours #90 and Norco 7.5, #90 with a second rx for next month.  -He will modify his regimen slightly over the next several days to make up for the difference in the lost MS Contin and hydrocodone (decrease to BID in the case of the morphine)  3. Effient per cardiology  4. He will follow up in 2 months. All questions were encouraged and answered. Fifteen minutes of face to face patient care time were spent during this visit. All questions were encouraged and answered.

## 2016-02-05 NOTE — Addendum Note (Signed)
Addended by: Marland Mcalpine B on: 02/05/2016 12:46 PM   Modules accepted: Orders

## 2016-02-11 LAB — TOXASSURE SELECT,+ANTIDEPR,UR

## 2016-02-11 LAB — 6-ACETYLMORPHINE,TOXASSURE ADD
6-ACETYLMORPHINE: NEGATIVE
6-acetylmorphine: NOT DETECTED ng/mg creat

## 2016-02-12 NOTE — Progress Notes (Signed)
Urine drug screen for this encounter is consistent for prescribed medication 

## 2016-03-04 ENCOUNTER — Other Ambulatory Visit (HOSPITAL_BASED_OUTPATIENT_CLINIC_OR_DEPARTMENT_OTHER): Payer: Medicare Other

## 2016-03-04 DIAGNOSIS — D6959 Other secondary thrombocytopenia: Secondary | ICD-10-CM | POA: Diagnosis not present

## 2016-03-04 DIAGNOSIS — Z862 Personal history of diseases of the blood and blood-forming organs and certain disorders involving the immune mechanism: Secondary | ICD-10-CM

## 2016-03-04 LAB — COMPREHENSIVE METABOLIC PANEL
ALT: 51 U/L (ref 0–55)
AST: 59 U/L — AB (ref 5–34)
Albumin: 3.8 g/dL (ref 3.5–5.0)
Alkaline Phosphatase: 73 U/L (ref 40–150)
Anion Gap: 10 mEq/L (ref 3–11)
BUN: 11.8 mg/dL (ref 7.0–26.0)
CALCIUM: 10.1 mg/dL (ref 8.4–10.4)
CHLORIDE: 101 meq/L (ref 98–109)
CO2: 27 mEq/L (ref 22–29)
CREATININE: 0.8 mg/dL (ref 0.7–1.3)
EGFR: >90 mL/min/{1.73_m2} (ref >90)
Glucose: 148 mg/dl — ABNORMAL HIGH (ref 70–140)
Potassium: 4.1 mEq/L (ref 3.5–5.1)
Sodium: 138 mEq/L (ref 136–145)
Total Bilirubin: 1.57 mg/dL — ABNORMAL HIGH (ref 0.20–1.20)
Total Protein: 7.5 g/dL (ref 6.4–8.3)

## 2016-03-04 LAB — CBC WITH DIFFERENTIAL/PLATELET
BASO%: 0.8 % (ref 0.0–2.0)
Basophils Absolute: 0 10*3/uL (ref 0.0–0.1)
EOS%: 1.6 % (ref 0.0–7.0)
Eosinophils Absolute: 0.1 10*3/uL (ref 0.0–0.5)
HEMATOCRIT: 42.7 % (ref 38.4–49.9)
HEMOGLOBIN: 14.3 g/dL (ref 13.0–17.1)
LYMPH#: 1.2 10*3/uL (ref 0.9–3.3)
LYMPH%: 26.9 % (ref 14.0–49.0)
MCH: 29.4 pg (ref 27.2–33.4)
MCHC: 33.4 g/dL (ref 32.0–36.0)
MCV: 88.2 fL (ref 79.3–98.0)
MONO#: 0.5 10*3/uL (ref 0.1–0.9)
MONO%: 10.5 % (ref 0.0–14.0)
NEUT#: 2.6 10*3/uL (ref 1.5–6.5)
NEUT%: 60.2 % (ref 39.0–75.0)
Platelets: 123 10*3/uL — ABNORMAL LOW (ref 140–400)
RBC: 4.85 10*6/uL (ref 4.20–5.82)
RDW: 14.1 % (ref 11.0–14.6)
WBC: 4.3 10*3/uL (ref 4.0–10.3)

## 2016-03-04 LAB — IRON AND TIBC
%SAT: 27 % (ref 20–55)
Iron: 95 ug/dL (ref 42–163)
TIBC: 355 ug/dL (ref 202–409)
UIBC: 260 ug/dL (ref 117–376)

## 2016-03-04 LAB — FERRITIN: Ferritin: 72 ng/ml (ref 22–316)

## 2016-03-11 ENCOUNTER — Telehealth: Payer: Self-pay | Admitting: *Deleted

## 2016-03-11 ENCOUNTER — Telehealth: Payer: Self-pay | Admitting: Oncology

## 2016-03-11 ENCOUNTER — Ambulatory Visit (HOSPITAL_BASED_OUTPATIENT_CLINIC_OR_DEPARTMENT_OTHER): Payer: Medicare Other

## 2016-03-11 ENCOUNTER — Ambulatory Visit (HOSPITAL_BASED_OUTPATIENT_CLINIC_OR_DEPARTMENT_OTHER): Payer: Medicare Other | Admitting: Oncology

## 2016-03-11 VITALS — BP 139/81 | HR 89 | Temp 98.2°F | Resp 17

## 2016-03-11 DIAGNOSIS — K7460 Unspecified cirrhosis of liver: Secondary | ICD-10-CM | POA: Diagnosis not present

## 2016-03-11 DIAGNOSIS — Z862 Personal history of diseases of the blood and blood-forming organs and certain disorders involving the immune mechanism: Secondary | ICD-10-CM

## 2016-03-11 DIAGNOSIS — D6959 Other secondary thrombocytopenia: Secondary | ICD-10-CM

## 2016-03-11 DIAGNOSIS — I2510 Atherosclerotic heart disease of native coronary artery without angina pectoris: Secondary | ICD-10-CM

## 2016-03-11 NOTE — Telephone Encounter (Signed)
Appointments scheduled per 2/7 LOS. Patient given AVS report and calendars with future scheduled appointments. Patient cannot come to Tuesday appointments. Appointments scheduled for a different day of the week per the patient's request.

## 2016-03-11 NOTE — Progress Notes (Signed)
Hematology and Oncology Follow Up Visit  LEONTA IGARASHI [id number] 10/22/1950 66 y.o. 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 He established care at the [facility] in August of 2014. 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   Prior Therapy: He have been receiving intermittent phlebotomies in the past.  Current therapy: Every 3 month phlebotomy to achieve ferritin level close to 50.  Interim History:  Mr. Dorer presents today for a followup visit. Since his last visit, he reports no changes in his health. He denied any recent hospitalization or illnesses. He denied any complications related to his liver disease. He continues to receive phlebotomy every 3 months have tolerated this schedule well. He denied any recent cardiac complaints such as chest pain, dyspnea on exertion or shortness of breath. He denied any complications related to phlebotomy including falls, syncope or dizziness. He reports he tolerates this procedure well especially after eating in the morning.  He continues to have chronic pain associated with his arthritis and not dramatically changed. He follows with physical medicine and rehabilitation services and his pain is manageable.  He has not reported any headaches or blurry vision or alteration of his mental status. Has not had any genitourinary complaints of frequency urgency or hesitancy. Has not reported any lymphadenopathy or petechiae. Has not reported any easy bruisability or any bleeding. Report any new skeletal complaints. The rest of his review of symptoms is unremarkable.   Medications: I have reviewed the patient's current medications.  Current Outpatient Prescriptions  Medication Sig Dispense Refill  . ACCU-CHEK FASTCLIX LANCETS MISC     . aspirin 81 MG tablet Take 81 mg by mouth daily.     . bisacodyl  (DULCOLAX) 5 MG EC tablet Take 5 mg by mouth at bedtime. 2 at bedtime    . buPROPion (WELLBUTRIN XL) 300 MG 24 hr tablet Take 1 tablet by mouth daily.    . carvedilol (COREG) 3.125 MG tablet Take 3.125 mg by mouth 2 (two) times daily.    . Cholecalciferol (VITAMIN D3) 2000 UNITS TABS Take 1 tablet by mouth daily.    . clopidogrel (PLAVIX) 75 MG tablet Take 1 tablet by mouth daily.    . furosemide (LASIX) 20 MG tablet Take 20 mg by mouth as needed for fluid.     [name] glimepiride (AMARYL) 2 MG tablet Take 1 mg by mouth daily before breakfast.     . HYDROcodone-acetaminophen (NORCO) 7.5-325 MG tablet Take 1 tablet by mouth every 6 (six) hours as needed (pain). 1 month supply 120 tablet 0  . indomethacin (INDOCIN) 25 MG capsule     . KRILL OIL PO Take 1 tablet by mouth daily.     [name] losartan (COZAAR) 100 MG tablet Take 100 mg by mouth daily.    . Magnesium Oxide 400 MG CAPS Take 1 capsule by mouth daily.    . metFORMIN (GLUCOPHAGE) 1000 MG tablet Take 1,000 mg by mouth every 12 (twelve) hours.    [name] morphine (MS CONTIN) 60 MG 12 hr tablet Take 1 tablet (60 mg total) by mouth 3 (three) times daily. 90 tablet 0  . Multiple Vitamin (MULTIVITAMIN) capsule Take 1 capsule by mouth daily. NO IRON    . NITROSTAT 0.4 MG SL tablet Place 0.4 mg under the tongue every 5 (five) minutes as needed for chest pain.     . polyethylene glycol powder (  GLYCOLAX/MIRALAX) powder Using measuring cap mix  17gm in water and drink two times daily 3162 g 11  . pravastatin (PRAVACHOL) 80 MG tablet Take 80 mg by mouth daily.    . Psyllium (METAMUCIL PO) Take 17 g/day by mouth 2 (two) times daily.    [name] zolpidem (AMBIEN) 10 MG tablet Take 10 mg by mouth at bedtime as needed for sleep.      No current facility-administered medications for this visit.      Allergies:  Allergies  Allergen Reactions  . Amlodipine Besy-Benazepril Hcl Cough  . Naproxen Other (See Comments)    Worsens Tinnitus    Past Medical History, Surgical  history, Social history, and Family History were reviewed and updated.  [name] Physical Exam: Blood pressure 95/65, pulse 82, temperature 97.7 F (36.5 C), temperature source Oral, resp. rate 18, height 6\' 4"  (1.93 m), weight 259 lb 4.8 oz (117.6 kg), SpO2 100 %. ECOG: 1 General appearance: Alert, awake gentleman without distress. Head: Normocephalic, without obvious abnormality no oral thrush noted. Neck: no adenopathy Lymph nodes: Cervical, supraclavicular, and axillary nodes normal. Heart:regular rate and rhythm, S1, S2 normal, no murmur, click, rub or gallop Lung:chest clear, no wheezing, rales, normal symmetric air entry. Abdomin: soft, non-tender, without masses or organomegaly no rebound or guarding. EXT:no erythema, induration, or nodules   Lab Results: Lab Results  Component Value Date   WBC 4.3 03/04/2016   HGB 14.3 03/04/2016   HCT 42.7 03/04/2016   MCV 88.2 03/04/2016   PLT 123 (L) 03/04/2016     Chemistry      Component Value Date/Time   NA 138 03/04/2016 0916   K 4.1 03/04/2016 0916   CL 105 06/08/2014 0342   CO2 27 03/04/2016 0916   BUN 11.8 03/04/2016 0916   CREATININE 0.8 03/04/2016 0916      Component Value Date/Time   CALCIUM 10.1 03/04/2016 0916   ALKPHOS 73 03/04/2016 0916   AST 59 (H) 03/04/2016 0916   ALT 51 03/04/2016 0916   BILITOT 1.57 (H) 03/04/2016 0916         Results for TAYEN, BEARDALL (MRN [id number]) as of 03/11/2016 09:04  Ref. Range 03/04/2016 09:16  Ferritin Latest Ref Range: 22 - 316 ng/ml 72      Impression and Plan:  66 year old gentleman with the following issues:  1. Hemachromatosis: He has double heterozygous mutation with the risk of iron overload is reasonably well. He is currently on phlebotomy every 3 months to keep his ferritin close to 50.   Ferritin on March 04, 2016 was 72 and the plan is to proceed with phlebotomy today. Risks and benefits of continuing this procedure were reviewed today and is agreeable to  continue with the same schedule.  2. Cirrhosis of the liver: He follows up with gastroenterology regarding that. His liver function tests are stable without any major changes. He does not exhibit any stigmata of worsening liver disease.  3. 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 No active bleeding noted and his platelet count remains above 100.  4. Coronary artery disease: Status post catheterization and stenting. No recent complications noted.  5. Follow-up: Will be in 3 months for possible repeat phlebotomy.   [facility], MD 2/7/20189:14 AM

## 2016-03-11 NOTE — Patient Instructions (Signed)

## 2016-03-11 NOTE — Telephone Encounter (Signed)
Per 2/7 LOS and staff message I have scheduled appt and notified the scheduler.

## 2016-03-26 DIAGNOSIS — K766 Portal hypertension: Secondary | ICD-10-CM | POA: Diagnosis not present

## 2016-03-26 DIAGNOSIS — K7460 Unspecified cirrhosis of liver: Secondary | ICD-10-CM | POA: Diagnosis not present

## 2016-03-26 DIAGNOSIS — K3189 Other diseases of stomach and duodenum: Secondary | ICD-10-CM | POA: Diagnosis not present

## 2016-04-03 ENCOUNTER — Telehealth: Payer: Self-pay | Admitting: Registered Nurse

## 2016-04-03 ENCOUNTER — Encounter: Payer: Medicare Other | Attending: Physical Medicine & Rehabilitation | Admitting: Registered Nurse

## 2016-04-03 ENCOUNTER — Encounter: Payer: Self-pay | Admitting: Registered Nurse

## 2016-04-03 VITALS — BP 115/65 | HR 95 | Resp 14

## 2016-04-03 DIAGNOSIS — M67911 Unspecified disorder of synovium and tendon, right shoulder: Secondary | ICD-10-CM

## 2016-04-03 DIAGNOSIS — E7800 Pure hypercholesterolemia, unspecified: Secondary | ICD-10-CM | POA: Insufficient documentation

## 2016-04-03 DIAGNOSIS — M452 Ankylosing spondylitis of cervical region: Secondary | ICD-10-CM | POA: Diagnosis not present

## 2016-04-03 DIAGNOSIS — M109 Gout, unspecified: Secondary | ICD-10-CM | POA: Diagnosis not present

## 2016-04-03 DIAGNOSIS — M1990 Unspecified osteoarthritis, unspecified site: Secondary | ICD-10-CM | POA: Diagnosis not present

## 2016-04-03 DIAGNOSIS — K219 Gastro-esophageal reflux disease without esophagitis: Secondary | ICD-10-CM | POA: Diagnosis not present

## 2016-04-03 DIAGNOSIS — N400 Enlarged prostate without lower urinary tract symptoms: Secondary | ICD-10-CM | POA: Diagnosis not present

## 2016-04-03 DIAGNOSIS — G8929 Other chronic pain: Secondary | ICD-10-CM | POA: Insufficient documentation

## 2016-04-03 DIAGNOSIS — F329 Major depressive disorder, single episode, unspecified: Secondary | ICD-10-CM | POA: Diagnosis not present

## 2016-04-03 DIAGNOSIS — M67912 Unspecified disorder of synovium and tendon, left shoulder: Secondary | ICD-10-CM

## 2016-04-03 DIAGNOSIS — Z5181 Encounter for therapeutic drug level monitoring: Secondary | ICD-10-CM | POA: Diagnosis not present

## 2016-04-03 DIAGNOSIS — E669 Obesity, unspecified: Secondary | ICD-10-CM | POA: Insufficient documentation

## 2016-04-03 DIAGNOSIS — F419 Anxiety disorder, unspecified: Secondary | ICD-10-CM | POA: Diagnosis not present

## 2016-04-03 DIAGNOSIS — M456 Ankylosing spondylitis lumbar region: Secondary | ICD-10-CM | POA: Diagnosis not present

## 2016-04-03 DIAGNOSIS — I2510 Atherosclerotic heart disease of native coronary artery without angina pectoris: Secondary | ICD-10-CM | POA: Diagnosis not present

## 2016-04-03 DIAGNOSIS — I10 Essential (primary) hypertension: Secondary | ICD-10-CM | POA: Diagnosis not present

## 2016-04-03 DIAGNOSIS — K449 Diaphragmatic hernia without obstruction or gangrene: Secondary | ICD-10-CM | POA: Insufficient documentation

## 2016-04-03 DIAGNOSIS — M7522 Bicipital tendinitis, left shoulder: Secondary | ICD-10-CM | POA: Diagnosis not present

## 2016-04-03 DIAGNOSIS — Z79899 Other long term (current) drug therapy: Secondary | ICD-10-CM | POA: Diagnosis not present

## 2016-04-03 DIAGNOSIS — E119 Type 2 diabetes mellitus without complications: Secondary | ICD-10-CM | POA: Insufficient documentation

## 2016-04-03 DIAGNOSIS — Z955 Presence of coronary angioplasty implant and graft: Secondary | ICD-10-CM | POA: Diagnosis not present

## 2016-04-03 DIAGNOSIS — G894 Chronic pain syndrome: Secondary | ICD-10-CM

## 2016-04-03 DIAGNOSIS — Z87891 Personal history of nicotine dependence: Secondary | ICD-10-CM | POA: Diagnosis not present

## 2016-04-03 DIAGNOSIS — M455 Ankylosing spondylitis of thoracolumbar region: Secondary | ICD-10-CM | POA: Diagnosis not present

## 2016-04-03 DIAGNOSIS — M545 Low back pain: Secondary | ICD-10-CM | POA: Diagnosis present

## 2016-04-03 MED ORDER — MORPHINE SULFATE ER 60 MG PO TBCR: 60.0000 mg | EXTENDED_RELEASE_TABLET | Freq: Three times a day (TID) | ORAL | 0 refills | Status: DC

## 2016-04-03 MED ORDER — HYDROCODONE-ACETAMINOPHEN 7.5-325 MG PO TABS: 1.0000 | ORAL_TABLET | Freq: Four times a day (QID) | ORAL | 0 refills | Status: DC | PRN

## 2016-04-03 NOTE — Telephone Encounter (Signed)
On 04/03/2016 the [place] was reviewed no conflict was seen on the [place] with multiple prescribers. Daniel Rivers has a signed narcotic contract with our office. If there were any discrepancies this would have been reported to his  physician.

## 2016-04-03 NOTE — Progress Notes (Signed)
Subjective:    Patient ID: Daniel Rivers, male    DOB: 07/14/1950, 66 y.o.   MRN: [id number]  HPI: Daniel Rivers is a 66 year old male who returns for chronic pain and medication refill. He states his pain is located in his lower back and right hip.He rates his pain 6. His current exercise regime is performing stretching exercises and walking in his home. Also states he is walking a 1 mile twice a day.  Pain Inventory Average Pain 5 Pain Right Now 6 My pain is sharp, burning, dull, stabbing, tingling and aching  In the last 24 hours, has pain interfered with the following? General activity 4 Relation with others 3 Enjoyment of life 5 What TIME of day is your pain at its worst? daytime Sleep (in general) Fair  Pain is worse with: walking, bending, standing and some activites Pain improves with: rest, heat/ice, therapy/exercise, pacing activities and medication Relief from Meds: 5  Mobility Do you have any goals in this area?  no  Function Do you have any goals in this area?  no  Neuro/Psych weakness numbness tingling trouble walking spasms  Prior Studies Any changes since last visit?  no  Physicians involved in your care Any changes since last visit?  no   No family history on file. Social History   Social History  . Marital status: Widowed    Spouse name: N/A  . Number of children: N/A  . Years of education: N/A   Social History Main Topics  . Smoking status: Former Smoker    Packs/day: 5.00    Years: 23.00    Types: Cigarettes    Quit date: 06/16/1986  . Smokeless tobacco: Never Used  . Alcohol use Yes     Comment: 05/22/2014  "used to drink casually; never had problem w/it; none since ~ 2004"  . Drug use: No  . Sexual activity: No   Other Topics Concern  . None   Social History Narrative  . None   Past Surgical History:  Procedure Laterality Date  . ANAL FISSURE REPAIR    . ANKLE FRACTURE SURGERY Left   . BACK SURGERY    . BONE  TISSUE     "I've had removal of excess tissue as a result of my ankylosing spondylitis"  . CARDIAC CATHETERIZATION N/A 06/07/2014   Procedure: Coronary Stent Intervention;  Surgeon: Adrian Prows, MD;  Location: [place] [facility];  Service: Cardiovascular;  Laterality: N/A;  . CARPAL TUNNEL RELEASE Bilateral   . COLON SURGERY    . CORONARY ANGIOPLASTY WITH STENT PLACEMENT  2009; 05/22/2014   ?3; ?1  . cortisone injection    . FRACTURE SURGERY    . LEFT HEART CATHETERIZATION WITH CORONARY ANGIOGRAM N/A 05/22/2014   Procedure: LEFT HEART CATHETERIZATION WITH CORONARY ANGIOGRAM;  Surgeon: Adrian Prows, MD;  Location: [facility] CATH LAB;  Service: Cardiovascular;  Laterality: N/A;  . LUMBAR DISC SURGERY  X 2   "ruptured/herniated/growing together"  . PERCUTANEOUS CORONARY STENT INTERVENTION (PCI-S)  05/22/2014   Procedure: PERCUTANEOUS CORONARY STENT INTERVENTION (PCI-S);  Surgeon: Adrian Prows, MD;  Location: [facility] CATH LAB;  Service: Cardiovascular;;  mid LAD DES  . PERCUTANEOUS CORONARY STENT INTERVENTION (PCI-S)  06/07/2014   des to rca  . SHOULDER ARTHROSCOPY W/ ROTATOR CUFF REPAIR Bilateral   . SMALL INTESTINE SURGERY  1960   "took out 14 inches"  . ULNAR TUNNEL RELEASE Left    Past Medical History:  Diagnosis Date  .  Ankylosing spondylitis of cervical region ([place])   . Ankylosing spondylitis of lumbar region ([place])   . Anxiety   . BPH (benign prostatic hypertrophy)   . Bursitis, trochanteric   . Chronic back pain   . Chronic cervical pain   . Coronary artery disease   . Daily headache    "recently; related to RX for my heart" (05/21/2014)  . Depression   . GERD (gastroesophageal reflux disease)   . Hemochromatosis   . High cholesterol   . History of hiatal hernia   . Hypertension   . 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 Obesity   . Osteoarthritis    "joints" (05/20/2104)  . Rotator cuff syndrome   . Tendonitis   . Type II diabetes mellitus (HCC)    BP 115/65    Pulse 95   Resp 14   SpO2 97%   Opioid Risk Score:   Fall Risk Score:  `1  Depression screen PHQ 2/9  Depression screen [facility] 2/9 04/03/2016 07/22/2015 04/22/2015 05/08/2014 02/09/2014 02/09/2014 11/08/2013  Decreased Interest 0 0 2 2 0 0 0  Down, Depressed, Hopeless 0 0 0 0 0 0 0  PHQ - 2 Score 0 0 2 2 0 0 0  Altered sleeping - - - 0 - - -  Tired, decreased energy - - - 3 - - -  Change in appetite - - - 0 - - -  Feeling bad or failure about yourself  - - - 1 - - -  Trouble concentrating - - - 1 - - -  Moving slowly or fidgety/restless - - - 0 - - -  Suicidal thoughts - - - 0 - - -  PHQ-9 Score - - - 7 - - -    Review of Systems  Constitutional: Negative.   HENT: Negative.   Eyes: Negative.   Respiratory: Negative.   Cardiovascular: Negative.   Gastrointestinal: Negative.   Endocrine: Negative.   Genitourinary: Negative.   Musculoskeletal: Negative.   Skin: Negative.   Allergic/Immunologic: Negative.   Neurological: Negative.   Hematological: Negative.   Psychiatric/Behavioral: Negative.   All other systems reviewed and are negative.      Objective:   Physical Exam  Constitutional: He is oriented to person, place, and time. He appears well-developed and well-nourished.  HENT:  Head: Normocephalic and atraumatic.  Neck: Normal range of motion. Neck supple.  Cervical Paraspinal Tenderness: C-5-C-6  Cardiovascular: Normal rate and regular rhythm.   Pulmonary/Chest: Effort normal and breath sounds normal.  Musculoskeletal:  Normal Muscle Bulk and Muscle Testing Reveals: Upper Extremities: Full ROM and Muscle Strength 5/5 Bilateral AC Joint Tenderness Lumbar Paraspinal Tenderness: L-3-L-5 Lower Extremities: Full ROM and Muscle Strength 5/5 Arises from chair with ease Narrow Based Gait  Neurological: He is alert and oriented to person, place, and time.  Skin: Skin is warm and dry.  Psychiatric: He has a normal mood and affect.  Nursing note and vitals  reviewed.         Assessment & Plan:  1. 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 Second script given for the following month.  2. Disorder of rotator cuff of both shoulders: Continue HEP as tolerated and Medication Regimen  20 minutes of face to face patient care time was spent during this visit. All questions were encouraged and  answered.   F/U in 2 months

## 2016-04-09 DIAGNOSIS — M5442 Lumbago with sciatica, left side: Secondary | ICD-10-CM | POA: Diagnosis not present

## 2016-04-09 DIAGNOSIS — G4700 Insomnia, unspecified: Secondary | ICD-10-CM | POA: Diagnosis not present

## 2016-04-09 DIAGNOSIS — Z23 Encounter for immunization: Secondary | ICD-10-CM | POA: Diagnosis not present

## 2016-04-09 DIAGNOSIS — Z5181 Encounter for therapeutic drug level monitoring: Secondary | ICD-10-CM | POA: Diagnosis not present

## 2016-04-09 DIAGNOSIS — R300 Dysuria: Secondary | ICD-10-CM | POA: Diagnosis not present

## 2016-04-09 DIAGNOSIS — G8929 Other chronic pain: Secondary | ICD-10-CM | POA: Diagnosis not present

## 2016-06-03 ENCOUNTER — Other Ambulatory Visit (HOSPITAL_BASED_OUTPATIENT_CLINIC_OR_DEPARTMENT_OTHER): Payer: Medicare Other

## 2016-06-03 DIAGNOSIS — Z862 Personal history of diseases of the blood and blood-forming organs and certain disorders involving the immune mechanism: Secondary | ICD-10-CM

## 2016-06-03 DIAGNOSIS — D6959 Other secondary thrombocytopenia: Secondary | ICD-10-CM | POA: Diagnosis not present

## 2016-06-03 LAB — CBC WITH DIFFERENTIAL/PLATELET
BASO%: 0.8 % (ref 0.0–2.0)
Basophils Absolute: 0 10*3/uL (ref 0.0–0.1)
EOS ABS: 0.1 10*3/uL (ref 0.0–0.5)
EOS%: 2 % (ref 0.0–7.0)
HEMATOCRIT: 42.4 % (ref 38.4–49.9)
HEMOGLOBIN: 14.2 g/dL (ref 13.0–17.1)
LYMPH#: 1.2 10*3/uL (ref 0.9–3.3)
LYMPH%: 28.4 % (ref 14.0–49.0)
MCH: 29.6 pg (ref 27.2–33.4)
MCHC: 33.5 g/dL (ref 32.0–36.0)
MCV: 88.4 fL (ref 79.3–98.0)
MONO#: 0.4 10*3/uL (ref 0.1–0.9)
MONO%: 9.9 % (ref 0.0–14.0)
NEUT%: 58.9 % (ref 39.0–75.0)
NEUTROS ABS: 2.4 10*3/uL (ref 1.5–6.5)
Platelets: 108 10*3/uL — ABNORMAL LOW (ref 140–400)
RBC: 4.79 10*6/uL (ref 4.20–5.82)
RDW: 14.3 % (ref 11.0–14.6)
WBC: 4.1 10*3/uL (ref 4.0–10.3)

## 2016-06-03 LAB — COMPREHENSIVE METABOLIC PANEL
ALBUMIN: 3.7 g/dL (ref 3.5–5.0)
ALK PHOS: 76 U/L (ref 40–150)
ALT: 40 U/L (ref 0–55)
AST: 43 U/L — AB (ref 5–34)
Anion Gap: 11 mEq/L (ref 3–11)
BILIRUBIN TOTAL: 1.28 mg/dL — AB (ref 0.20–1.20)
BUN: 13.4 mg/dL (ref 7.0–26.0)
CALCIUM: 9.6 mg/dL (ref 8.4–10.4)
CO2: 26 mEq/L (ref 22–29)
CREATININE: 0.8 mg/dL (ref 0.7–1.3)
Chloride: 102 mEq/L (ref 98–109)
EGFR: >90 mL/min/{1.73_m2} (ref >90)
GLUCOSE: 175 mg/dL — AB (ref 70–140)
Potassium: 4.3 mEq/L (ref 3.5–5.1)
SODIUM: 139 meq/L (ref 136–145)
TOTAL PROTEIN: 7.3 g/dL (ref 6.4–8.3)

## 2016-06-03 LAB — IRON AND TIBC
%SAT: 21 % (ref 20–55)
Iron: 71 ug/dL (ref 42–163)
TIBC: 333 ug/dL (ref 202–409)
UIBC: 261 ug/dL (ref 117–376)

## 2016-06-03 LAB — FERRITIN: Ferritin: 65 ng/ml (ref 22–316)

## 2016-06-04 ENCOUNTER — Encounter: Payer: Self-pay | Admitting: Registered Nurse

## 2016-06-04 ENCOUNTER — Encounter: Payer: Medicare Other | Attending: Physical Medicine & Rehabilitation | Admitting: Registered Nurse

## 2016-06-04 VITALS — BP 146/95 | HR 89

## 2016-06-04 DIAGNOSIS — M459 Ankylosing spondylitis of unspecified sites in spine: Secondary | ICD-10-CM | POA: Diagnosis not present

## 2016-06-04 DIAGNOSIS — E119 Type 2 diabetes mellitus without complications: Secondary | ICD-10-CM | POA: Diagnosis not present

## 2016-06-04 DIAGNOSIS — M7062 Trochanteric bursitis, left hip: Secondary | ICD-10-CM | POA: Diagnosis not present

## 2016-06-04 DIAGNOSIS — I10 Essential (primary) hypertension: Secondary | ICD-10-CM | POA: Insufficient documentation

## 2016-06-04 DIAGNOSIS — G894 Chronic pain syndrome: Secondary | ICD-10-CM

## 2016-06-04 DIAGNOSIS — G8929 Other chronic pain: Secondary | ICD-10-CM | POA: Diagnosis not present

## 2016-06-04 DIAGNOSIS — K219 Gastro-esophageal reflux disease without esophagitis: Secondary | ICD-10-CM | POA: Diagnosis not present

## 2016-06-04 DIAGNOSIS — F329 Major depressive disorder, single episode, unspecified: Secondary | ICD-10-CM | POA: Diagnosis not present

## 2016-06-04 DIAGNOSIS — I2510 Atherosclerotic heart disease of native coronary artery without angina pectoris: Secondary | ICD-10-CM | POA: Insufficient documentation

## 2016-06-04 DIAGNOSIS — Z79899 Other long term (current) drug therapy: Secondary | ICD-10-CM

## 2016-06-04 DIAGNOSIS — Z955 Presence of coronary angioplasty implant and graft: Secondary | ICD-10-CM | POA: Insufficient documentation

## 2016-06-04 DIAGNOSIS — E669 Obesity, unspecified: Secondary | ICD-10-CM | POA: Diagnosis not present

## 2016-06-04 DIAGNOSIS — K449 Diaphragmatic hernia without obstruction or gangrene: Secondary | ICD-10-CM | POA: Insufficient documentation

## 2016-06-04 DIAGNOSIS — N400 Enlarged prostate without lower urinary tract symptoms: Secondary | ICD-10-CM | POA: Insufficient documentation

## 2016-06-04 DIAGNOSIS — Z5181 Encounter for therapeutic drug level monitoring: Secondary | ICD-10-CM | POA: Diagnosis not present

## 2016-06-04 DIAGNOSIS — M1990 Unspecified osteoarthritis, unspecified site: Secondary | ICD-10-CM | POA: Diagnosis not present

## 2016-06-04 DIAGNOSIS — M5412 Radiculopathy, cervical region: Secondary | ICD-10-CM | POA: Diagnosis not present

## 2016-06-04 DIAGNOSIS — Z87891 Personal history of nicotine dependence: Secondary | ICD-10-CM | POA: Diagnosis not present

## 2016-06-04 DIAGNOSIS — M7061 Trochanteric bursitis, right hip: Secondary | ICD-10-CM

## 2016-06-04 DIAGNOSIS — M109 Gout, unspecified: Secondary | ICD-10-CM | POA: Diagnosis not present

## 2016-06-04 DIAGNOSIS — M452 Ankylosing spondylitis of cervical region: Secondary | ICD-10-CM | POA: Diagnosis not present

## 2016-06-04 DIAGNOSIS — M7522 Bicipital tendinitis, left shoulder: Secondary | ICD-10-CM | POA: Insufficient documentation

## 2016-06-04 DIAGNOSIS — M456 Ankylosing spondylitis lumbar region: Secondary | ICD-10-CM | POA: Insufficient documentation

## 2016-06-04 DIAGNOSIS — F419 Anxiety disorder, unspecified: Secondary | ICD-10-CM | POA: Insufficient documentation

## 2016-06-04 DIAGNOSIS — M542 Cervicalgia: Secondary | ICD-10-CM

## 2016-06-04 DIAGNOSIS — E7800 Pure hypercholesterolemia, unspecified: Secondary | ICD-10-CM | POA: Diagnosis not present

## 2016-06-04 DIAGNOSIS — M545 Low back pain, unspecified: Secondary | ICD-10-CM

## 2016-06-04 DIAGNOSIS — M455 Ankylosing spondylitis of thoracolumbar region: Secondary | ICD-10-CM | POA: Diagnosis not present

## 2016-06-04 MED ORDER — HYDROCODONE-ACETAMINOPHEN 7.5-325 MG PO TABS: 1.0000 | ORAL_TABLET | Freq: Four times a day (QID) | ORAL | 0 refills | Status: DC | PRN

## 2016-06-04 MED ORDER — MORPHINE SULFATE ER 60 MG PO TBCR: 60.0000 mg | EXTENDED_RELEASE_TABLET | Freq: Three times a day (TID) | ORAL | 0 refills | Status: DC

## 2016-06-04 NOTE — Progress Notes (Signed)
Subjective:    Patient ID: Daniel Rivers, male    DOB: 10-09-1950, 66 y.o.   MRN: [id number]  HPI: [name] is a 66 year old male who returns for chronic pain and medication refill. He states his pain is located in his neck radiating into his right shoulder, lower back and bilateral hips R>L.Daniel KitchenHe rates his pain 5. His current exercise regime is performing stretching exercises and walking in his home.   Last UDS was performed on 02/05/2016, Hydrocodone was not detected.  Hydrocodone was picked up on 01/10/2017. [name] takes his Hydrocodone as needed.   Pain Inventory Average Pain 6 Pain Right Now 5 My pain is sharp, burning, dull, stabbing, tingling and aching  In the last 24 hours, has pain interfered with the following? General activity 5 Relation with others 3 Enjoyment of life 4 What TIME of day is your pain at its worst? evening Sleep (in general) Good  Pain is worse with: bending, sitting, inactivity, standing and some activites Pain improves with: rest, heat/ice, therapy/exercise, pacing activities and medication Relief from Meds: 5  Mobility Do you have any goals in this area?  no  Function Do you have any goals in this area?  no  Neuro/Psych weakness numbness tingling  Prior Studies Any changes since last visit?  no  Physicians involved in your care Any changes since last visit?  no   No family history on file. Social History   Social History  . Marital status: Widowed    Spouse name: N/A  . Number of children: N/A  . Years of education: N/A   Social History Main Topics  . Smoking status: Former Smoker    Packs/day: 5.00    Years: 23.00    Types: Cigarettes    Quit date: 06/16/1986  . Smokeless tobacco: Never Used  . Alcohol use Yes     Comment: 05/22/2014  "used to drink casually; never had problem w/it; none since ~ 2004"  . Drug use: No  . Sexual activity: No   Other Topics Concern  . None   Social History Narrative  .  None   Past Surgical History:  Procedure Laterality Date  . ANAL FISSURE REPAIR    . ANKLE FRACTURE SURGERY Left   . BACK SURGERY    . BONE TISSUE     "I've had removal of excess tissue as a result of my ankylosing spondylitis"  . CARDIAC CATHETERIZATION N/A 06/07/2014   Procedure: Coronary Stent Intervention;  Surgeon: Daniel Prows, MD;  Location: [place] [facility];  Service: Cardiovascular;  Laterality: N/A;  . CARPAL TUNNEL RELEASE Bilateral   . COLON SURGERY    . CORONARY ANGIOPLASTY WITH STENT PLACEMENT  2009; 05/22/2014   ?3; ?1  . cortisone injection    . FRACTURE SURGERY    . LEFT HEART CATHETERIZATION WITH CORONARY ANGIOGRAM N/A 05/22/2014   Procedure: LEFT HEART CATHETERIZATION WITH CORONARY ANGIOGRAM;  Surgeon: Daniel Prows, MD;  [facility] CATH LAB;  Service: Cardiovascular;  Laterality: N/A;  . LUMBAR DISC SURGERY  X 2   "ruptured/herniated/growing together"  . PERCUTANEOUS CORONARY STENT INTERVENTION (PCI-S)  05/22/2014   Procedure: PERCUTANEOUS CORONARY STENT INTERVENTION (PCI-S);  Surgeon: Daniel Prows, MD;  Location: [facility] CATH LAB;  Service: Cardiovascular;;  mid LAD DES  . PERCUTANEOUS CORONARY STENT INTERVENTION (PCI-S)  06/07/2014   des to rca  . SHOULDER ARTHROSCOPY W/ ROTATOR CUFF REPAIR Bilateral   . SMALL INTESTINE SURGERY  1960   "  took out 14 inches"  . ULNAR TUNNEL RELEASE Left    Past Medical History:  Diagnosis Date  . Ankylosing spondylitis of cervical region ([place])   . Ankylosing spondylitis of lumbar region ([place])   . Anxiety   . BPH (benign prostatic hypertrophy)   . Bursitis, trochanteric   . Chronic back pain   . Chronic cervical pain   . Coronary artery disease   . Daily headache    "recently; related to RX for my heart" (05/21/2014)  . Depression   . GERD (gastroesophageal reflux disease)   . Hemochromatosis   . High cholesterol   . History of hiatal hernia   . Hypertension   . 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  Obesity   . Osteoarthritis    "joints" (05/20/2104)  . Rotator cuff syndrome   . Tendonitis   . Type II diabetes mellitus (HCC)    BP (!) 146/95   Pulse 89   SpO2 97%   Opioid Risk Score:   Fall Risk Score:  `1  Depression screen PHQ 2/9  Depression screen [facility] 2/9 06/04/2016 04/03/2016 07/22/2015 04/22/2015 05/08/2014 02/09/2014 02/09/2014  Decreased Interest 0 0 0 2 2 0 0  Down, Depressed, Hopeless 0 0 0 0 0 0 0  PHQ - 2 Score 0 0 0 2 2 0 0  Altered sleeping - - - - 0 - -  Tired, decreased energy - - - - 3 - -  Change in appetite - - - - 0 - -  Feeling bad or failure about yourself  - - - - 1 - -  Trouble concentrating - - - - 1 - -  Moving slowly or fidgety/restless - - - - 0 - -  Suicidal thoughts - - - - 0 - -  PHQ-9 Score - - - - 7 - -   Review of Systems  Constitutional: Negative.   HENT: Negative.   Eyes: Negative.   Respiratory: Negative.   Cardiovascular: Negative.   Gastrointestinal: Negative.   Endocrine: Negative.   Genitourinary: Negative.   Musculoskeletal: Negative.   Skin: Negative.   Allergic/Immunologic: Negative.   Neurological: Positive for numbness.       Tingling  Hematological: Negative.   Psychiatric/Behavioral: Negative.   All other systems reviewed and are negative.      Objective:   Physical Exam  Constitutional: He is oriented to person, place, and time. He appears well-developed and well-nourished.  HENT:  Head: Normocephalic and atraumatic.  Neck: Normal range of motion. Neck supple.  Cervical Paraspinal Tenderness: C-5-C-6  Cardiovascular: Normal rate and regular rhythm.   Pulmonary/Chest: Effort normal and breath sounds normal.  Musculoskeletal:  Normal Muscle Bulk and Muscle Testing Reveals: Upper Extremities: Full ROM and Muscle Strength 5/5 Right: AC Joint Tenderness Thoracic Paraspinal Tenderness: T-1-T-3 Mainly Right Side Lumbar Paraspinal Tenderness: L-3-L-5 Lower Extremities: Full ROM and Muscle Strength 5/5 Arises from chair  with ease Narrow Based Gait  Neurological: He is alert and oriented to person, place, and time.  Skin: Skin is warm and dry.  Psychiatric: He has a normal mood and affect.  Nursing note and vitals reviewed.         Assessment & Plan:  1. Ankylosing spondylitis of the cervical and lumbar spine: 06/04/2016 Refilled: Hydrocodone 7.5/325mg  one tablet every 6 hours as needed #120 and MS Contin 60 mg one tablet three times a day #90. Second script given for the following month.  2. Disorder  of rotator cuff of both shoulders: Continue HEP as tolerated and Medication Regimen. 06/04/2016 3. Cervicalgia/ Cervical Radiculitis: Refuses Gabapentin at this time. Continue to Monitor. 06/04/2016.  20 minutes of face to face patient care time was spent during this visit. All questions were encouraged and answered.  F/U in 2 months

## 2016-06-11 ENCOUNTER — Telehealth: Payer: Self-pay | Admitting: Oncology

## 2016-06-11 ENCOUNTER — Ambulatory Visit (HOSPITAL_BASED_OUTPATIENT_CLINIC_OR_DEPARTMENT_OTHER): Payer: Medicare Other | Admitting: Oncology

## 2016-06-11 ENCOUNTER — Ambulatory Visit (HOSPITAL_BASED_OUTPATIENT_CLINIC_OR_DEPARTMENT_OTHER): Payer: Medicare Other

## 2016-06-11 VITALS — BP 107/66 | HR 93 | Temp 97.7°F | Resp 18

## 2016-06-11 DIAGNOSIS — D6959 Other secondary thrombocytopenia: Secondary | ICD-10-CM | POA: Diagnosis not present

## 2016-06-11 DIAGNOSIS — K7460 Unspecified cirrhosis of liver: Secondary | ICD-10-CM | POA: Diagnosis not present

## 2016-06-11 DIAGNOSIS — Z862 Personal history of diseases of the blood and blood-forming organs and certain disorders involving the immune mechanism: Secondary | ICD-10-CM

## 2016-06-11 NOTE — Patient Instructions (Signed)
Therapeutic Phlebotomy Therapeutic phlebotomy is the controlled removal of blood from a person's body for the purpose of treating a medical condition. The procedure is similar to donating blood. Usually, about a pint (470 mL, or 0.47L) of blood is removed. The average adult has 9-12 pints (4.3-5.7 L) of blood. Therapeutic phlebotomy may be used to treat the following medical conditions:  Hemochromatosis. This is a condition in which the blood contains too much iron.  Polycythemia vera. This is a condition in which the blood contains too many red blood cells.  Porphyria cutanea tarda. This is a disease in which an important part of hemoglobin is not made properly. It results in the buildup of abnormal amounts of porphyrins in the body.  Sickle cell disease. This is a condition in which the red blood cells form an abnormal crescent shape rather than a round shape. Tell a health care provider about:  Any allergies you have.  All medicines you are taking, including vitamins, herbs, eye drops, creams, and over-the-counter medicines.  Any problems you or family members have had with anesthetic medicines.  Any blood disorders you have.  Any surgeries you have had.  Any medical conditions you have. What are the risks? Generally, this is a safe procedure. However, problems may occur, including:  Nausea or light-headedness.  Low blood pressure.  Soreness, bleeding, swelling, or bruising at the needle insertion site.  Infection. What happens before the procedure?  Follow instructions from your health care provider about eating or drinking restrictions.  Ask your health care provider about changing or stopping your regular medicines. This is especially important if you are taking diabetes medicines or blood thinners.  Wear clothing with sleeves that can be raised above the elbow.  Plan to have someone take you home after the procedure.  You may have a blood sample taken. What happens  during the procedure?  A needle will be inserted into one of your veins.  Tubing and a collection bag will be attached to that needle.  Blood will flow through the needle and tubing into the collection bag.  You may be asked to open and close your hand slowly and continually during the entire collection.  After the specified amount of blood has been removed from your body, the collection bag and tubing will be clamped.  The needle will be removed from your vein.  Pressure will be held on the site of the needle insertion to stop the bleeding.  A bandage (dressing) will be placed over the needle insertion site. The procedure may vary among health care providers and hospitals. What happens after the procedure?  Your recovery will be assessed and monitored.  You can return to your normal activities as directed by your health care provider. This information is not intended to replace advice given to you by your health care provider. Make sure you discuss any questions you have with your health care provider. Document Released: 06/23/2010 Document Revised: 09/21/2015 Document Reviewed: 01/15/2014 Elsevier Interactive Patient Education  2017 [employer] Inc[employer]  

## 2016-06-11 NOTE — Telephone Encounter (Signed)
Appointments scheduled per 06/11/16 los. Patient was given a copy of the AVS report and appointment schedule per 06/11/16 los. °

## 2016-06-11 NOTE — Progress Notes (Signed)
Phlebotomy started at 1206 with 16g phlebotomy kit to left [facility]. Phlebotomy ended at 1220 and 527 grams removed. Snack and drink provided. Patient monitored for 30 minutes post transfusion. Patient and vital signs stable upon discharge.

## 2016-06-11 NOTE — Progress Notes (Signed)
Hematology and Oncology Follow Up Visit  Daniel Rivers [id number] Mar 16, 1950 66 y.o. 06/11/2016 11:00 AM Daniel Rivers, MDPang, Richard, MD   Principle Diagnosis: 66 year old with hemochromatosis and liver cirrhosis diagnosed in 2009. He established care at the [facility] in August of 2014. 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   Prior Therapy: He have been receiving intermittent phlebotomies in the past.  Current therapy: Every 3 month phlebotomy to achieve ferritin level close to 50.  Interim History:  Daniel Rivers presents today for a followup visit. Since his last visit, he reports doing well without complaints. He denied any complications related to his liver disease. He continues to receive phlebotomy every 3 months have tolerated this schedule well. He denied any chest pain, dyspnea on exertion or shortness of breath. He denied any complications related to phlebotomy including falls, syncope or dizziness. He reports he does not eat prior to phlebotomy he feels dizzy but that has not been the case as of late.  He has not reported any headaches or blurry vision or alteration of his mental status. Has not had any genitourinary complaints of frequency urgency or hesitancy. Has not reported any lymphadenopathy or petechiae. Has not reported any easy bruisability or any bleeding. Report any new skeletal complaints. The rest of his review of symptoms is unremarkable.   Medications: I have reviewed the patient's current medications.  Current Outpatient Prescriptions  Medication Sig Dispense Refill  . ACCU-CHEK FASTCLIX LANCETS MISC     . aspirin 81 MG tablet Take 81 mg by mouth daily.     . bisacodyl (DULCOLAX) 5 MG EC tablet Take 5 mg by mouth at bedtime. 2 at bedtime    . buPROPion (WELLBUTRIN XL) 300 MG 24 hr tablet Take 1 tablet by mouth daily.    . carvedilol (COREG) 3.125 MG tablet Take 3.125 mg by mouth 2 (two) times daily.    .  Cholecalciferol (VITAMIN D3) 2000 UNITS TABS Take 1 tablet by mouth daily.    . clopidogrel (PLAVIX) 75 MG tablet Take 1 tablet by mouth daily.    . furosemide (LASIX) 20 MG tablet Take 20 mg by mouth as needed for fluid.     [name] glimepiride (AMARYL) 2 MG tablet Take 1 mg by mouth daily before breakfast.     . HYDROcodone-acetaminophen (NORCO) 7.5-325 MG tablet Take 1 tablet by mouth every 6 (six) hours as needed (pain). 1 month supply 120 tablet 0  . KRILL OIL PO Take 1 tablet by mouth daily.     [name] losartan (COZAAR) 100 MG tablet Take 100 mg by mouth daily.    . Magnesium Oxide 400 MG CAPS Take 1 capsule by mouth daily.    . metFORMIN (GLUCOPHAGE) 1000 MG tablet Take 1,000 mg by mouth every 12 (twelve) hours.    [name] morphine (MS CONTIN) 60 MG 12 hr tablet Take 1 tablet (60 mg total) by mouth 3 (three) times daily. 90 tablet 0  . Multiple Vitamin (MULTIVITAMIN) capsule Take 1 capsule by mouth daily. NO IRON    . NITROSTAT 0.4 MG SL tablet Place 0.4 mg under the tongue every 5 (five) minutes as needed for chest pain.     . polyethylene glycol powder (GLYCOLAX/MIRALAX) powder Using measuring cap mix  17gm in water and drink two times daily 3162 g 11  . pravastatin (PRAVACHOL) 80 MG tablet Take 80 mg by mouth daily.    . Psyllium (METAMUCIL PO) Take 17 g/day  by mouth 2 (two) times daily.    [name] zolpidem (AMBIEN) 10 MG tablet Take 10 mg by mouth at bedtime as needed for sleep.      No current facility-administered medications for this visit.      Allergies:  Allergies  Allergen Reactions  . Amlodipine Besy-Benazepril Hcl Cough  . Naproxen Other (See Comments)    Worsens Tinnitus    Past Medical History, Surgical history, Social history, and Family History were reviewed and updated.  [name] Physical Exam: Blood pressure 127/67, pulse 89, temperature 98.4 F (36.9 C), temperature source Oral, resp. rate 18, weight 258 lb 5 oz (117.2 kg), SpO2 99 %. ECOG: 1 General appearance: Well-appearing  gentleman without distress. Head: Normocephalic, without obvious abnormality no oral thrush or ulcers. Neck: no adenopathy no thyroid masses. Lymph nodes: Cervical, supraclavicular, and axillary nodes normal. Heart:regular rate and rhythm, S1, S2 normal, no murmur, click, rub or gallop Lung:chest clear, no wheezing, rales, normal symmetric air entry. Abdomin: soft, non-tender, without masses or organomegaly no shifting dullness or ascites. EXT:no erythema, induration, or nodules   Lab Results: Lab Results  Component Value Date   WBC 4.1 06/03/2016   HGB 14.2 06/03/2016   HCT 42.4 06/03/2016   MCV 88.4 06/03/2016   PLT 108 (L) 06/03/2016     Chemistry      Component Value Date/Time   NA 139 06/03/2016 0946   K 4.3 06/03/2016 0946   CL 105 06/08/2014 0342   CO2 26 06/03/2016 0946   BUN 13.4 06/03/2016 0946   CREATININE 0.8 06/03/2016 0946      Component Value Date/Time   CALCIUM 9.6 06/03/2016 0946   ALKPHOS 76 06/03/2016 0946   AST 43 (H) 06/03/2016 0946   ALT 40 06/03/2016 0946   BILITOT 1.28 (H) 06/03/2016 0946        Results for [name], [name] (MRN [id number]) as of 06/11/2016 10:35  Ref. Range 03/04/2016 09:16 06/03/2016 09:46  Ferritin Latest Ref Range: 22 - 316 ng/ml 72 65      Impression and Plan:  66 year old gentleman with the following issues:  1. Hemachromatosis: He has double heterozygous mutation with the risk of iron overload is reasonably well. He is currently on phlebotomy every 3 months to keep his ferritin close to 50.   Ferritin on 06/03/2016 was 65 with normal hemoglobin and liver function tests. Risks and benefits of continuing this procedure were reviewed today and is agreeable to continue with the same schedule. We will continue to monitor his ferritin every 3 months and proceed with phlebotomy to get her close to 50.  2. Cirrhosis of the liver: He follows up with gastroenterology regarding that. His liver function tests are stable without  any major changes. He does not exhibit any stigmata of worsening liver disease.  3. 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 No bleeding noted.  4. Coronary artery disease: Status post catheterization and stenting. No recent complications noted.  5. Follow-up: Will be in 3 months for possible repeat phlebotomy.   [facility], MD 5/10/201811:00 AM

## 2016-06-25 DIAGNOSIS — H40013 Open angle with borderline findings, low risk, bilateral: Secondary | ICD-10-CM | POA: Diagnosis not present

## 2016-06-25 DIAGNOSIS — H2513 Age-related nuclear cataract, bilateral: Secondary | ICD-10-CM | POA: Diagnosis not present

## 2016-06-25 DIAGNOSIS — E119 Type 2 diabetes mellitus without complications: Secondary | ICD-10-CM | POA: Diagnosis not present

## 2016-06-26 DIAGNOSIS — Z23 Encounter for immunization: Secondary | ICD-10-CM | POA: Diagnosis not present

## 2016-06-26 DIAGNOSIS — Z125 Encounter for screening for malignant neoplasm of prostate: Secondary | ICD-10-CM | POA: Diagnosis not present

## 2016-06-26 DIAGNOSIS — E1165 Type 2 diabetes mellitus with hyperglycemia: Secondary | ICD-10-CM | POA: Diagnosis not present

## 2016-06-26 DIAGNOSIS — Z5181 Encounter for therapeutic drug level monitoring: Secondary | ICD-10-CM | POA: Diagnosis not present

## 2016-06-26 DIAGNOSIS — Z0000 Encounter for general adult medical examination without abnormal findings: Secondary | ICD-10-CM | POA: Diagnosis not present

## 2016-06-26 DIAGNOSIS — I10 Essential (primary) hypertension: Secondary | ICD-10-CM | POA: Diagnosis not present

## 2016-07-03 DIAGNOSIS — E7800 Pure hypercholesterolemia, unspecified: Secondary | ICD-10-CM | POA: Diagnosis not present

## 2016-07-03 DIAGNOSIS — Z0000 Encounter for general adult medical examination without abnormal findings: Secondary | ICD-10-CM | POA: Diagnosis not present

## 2016-07-03 DIAGNOSIS — E1165 Type 2 diabetes mellitus with hyperglycemia: Secondary | ICD-10-CM | POA: Diagnosis not present

## 2016-07-03 DIAGNOSIS — I10 Essential (primary) hypertension: Secondary | ICD-10-CM | POA: Diagnosis not present

## 2016-07-21 DIAGNOSIS — R300 Dysuria: Secondary | ICD-10-CM | POA: Diagnosis not present

## 2016-07-21 DIAGNOSIS — R369 Urethral discharge, unspecified: Secondary | ICD-10-CM | POA: Diagnosis not present

## 2016-07-22 DIAGNOSIS — M450 Ankylosing spondylitis of multiple sites in spine: Secondary | ICD-10-CM | POA: Diagnosis not present

## 2016-07-22 DIAGNOSIS — E119 Type 2 diabetes mellitus without complications: Secondary | ICD-10-CM | POA: Diagnosis not present

## 2016-07-22 DIAGNOSIS — I2510 Atherosclerotic heart disease of native coronary artery without angina pectoris: Secondary | ICD-10-CM | POA: Diagnosis not present

## 2016-07-22 DIAGNOSIS — Z9861 Coronary angioplasty status: Secondary | ICD-10-CM | POA: Diagnosis not present

## 2016-07-28 DIAGNOSIS — R360 Urethral discharge without blood: Secondary | ICD-10-CM | POA: Diagnosis not present

## 2016-08-04 ENCOUNTER — Encounter: Payer: Medicare Other | Attending: Physical Medicine & Rehabilitation | Admitting: Physical Medicine & Rehabilitation

## 2016-08-04 ENCOUNTER — Encounter: Payer: Self-pay | Admitting: Physical Medicine & Rehabilitation

## 2016-08-04 VITALS — BP 120/72 | HR 90 | Resp 14

## 2016-08-04 DIAGNOSIS — M1990 Unspecified osteoarthritis, unspecified site: Secondary | ICD-10-CM | POA: Diagnosis not present

## 2016-08-04 DIAGNOSIS — M7522 Bicipital tendinitis, left shoulder: Secondary | ICD-10-CM | POA: Diagnosis not present

## 2016-08-04 DIAGNOSIS — M456 Ankylosing spondylitis lumbar region: Secondary | ICD-10-CM | POA: Diagnosis not present

## 2016-08-04 DIAGNOSIS — M452 Ankylosing spondylitis of cervical region: Secondary | ICD-10-CM | POA: Insufficient documentation

## 2016-08-04 DIAGNOSIS — M455 Ankylosing spondylitis of thoracolumbar region: Secondary | ICD-10-CM | POA: Diagnosis not present

## 2016-08-04 DIAGNOSIS — E119 Type 2 diabetes mellitus without complications: Secondary | ICD-10-CM | POA: Insufficient documentation

## 2016-08-04 DIAGNOSIS — G8929 Other chronic pain: Secondary | ICD-10-CM | POA: Insufficient documentation

## 2016-08-04 DIAGNOSIS — Z87891 Personal history of nicotine dependence: Secondary | ICD-10-CM | POA: Diagnosis not present

## 2016-08-04 DIAGNOSIS — G894 Chronic pain syndrome: Secondary | ICD-10-CM | POA: Diagnosis not present

## 2016-08-04 DIAGNOSIS — E7800 Pure hypercholesterolemia, unspecified: Secondary | ICD-10-CM | POA: Insufficient documentation

## 2016-08-04 DIAGNOSIS — K449 Diaphragmatic hernia without obstruction or gangrene: Secondary | ICD-10-CM | POA: Diagnosis not present

## 2016-08-04 DIAGNOSIS — M457 Ankylosing spondylitis of lumbosacral region: Secondary | ICD-10-CM | POA: Diagnosis not present

## 2016-08-04 DIAGNOSIS — I2510 Atherosclerotic heart disease of native coronary artery without angina pectoris: Secondary | ICD-10-CM | POA: Insufficient documentation

## 2016-08-04 DIAGNOSIS — K219 Gastro-esophageal reflux disease without esophagitis: Secondary | ICD-10-CM | POA: Insufficient documentation

## 2016-08-04 DIAGNOSIS — Z79899 Other long term (current) drug therapy: Secondary | ICD-10-CM | POA: Diagnosis not present

## 2016-08-04 DIAGNOSIS — Z5181 Encounter for therapeutic drug level monitoring: Secondary | ICD-10-CM | POA: Diagnosis not present

## 2016-08-04 DIAGNOSIS — N400 Enlarged prostate without lower urinary tract symptoms: Secondary | ICD-10-CM | POA: Diagnosis not present

## 2016-08-04 DIAGNOSIS — M7631 Iliotibial band syndrome, right leg: Secondary | ICD-10-CM | POA: Diagnosis not present

## 2016-08-04 DIAGNOSIS — E669 Obesity, unspecified: Secondary | ICD-10-CM | POA: Insufficient documentation

## 2016-08-04 DIAGNOSIS — F419 Anxiety disorder, unspecified: Secondary | ICD-10-CM | POA: Insufficient documentation

## 2016-08-04 DIAGNOSIS — M109 Gout, unspecified: Secondary | ICD-10-CM | POA: Insufficient documentation

## 2016-08-04 DIAGNOSIS — Z955 Presence of coronary angioplasty implant and graft: Secondary | ICD-10-CM | POA: Insufficient documentation

## 2016-08-04 DIAGNOSIS — F329 Major depressive disorder, single episode, unspecified: Secondary | ICD-10-CM | POA: Insufficient documentation

## 2016-08-04 DIAGNOSIS — M545 Low back pain: Secondary | ICD-10-CM | POA: Diagnosis present

## 2016-08-04 DIAGNOSIS — I10 Essential (primary) hypertension: Secondary | ICD-10-CM | POA: Diagnosis not present

## 2016-08-04 MED ORDER — DICLOFENAC SODIUM 75 MG PO TBEC: 75.0000 mg | DELAYED_RELEASE_TABLET | Freq: Two times a day (BID) | ORAL | 1 refills | Status: DC

## 2016-08-04 MED ORDER — HYDROCODONE-ACETAMINOPHEN 7.5-325 MG PO TABS: 1.0000 | ORAL_TABLET | Freq: Four times a day (QID) | ORAL | 0 refills | Status: DC | PRN

## 2016-08-04 MED ORDER — MORPHINE SULFATE ER 60 MG PO TBCR: 60.0000 mg | EXTENDED_RELEASE_TABLET | Freq: Three times a day (TID) | ORAL | 0 refills | Status: DC

## 2016-08-04 NOTE — Progress Notes (Signed)
Subjective:    Patient ID: Daniel Rivers, male    DOB: 12/31/1950, 66 y.o.   MRN: [id number]  HPI   Daniel Rivers is here in follow up of his chronic pain. He has had more pain in the right hip/low back over the last 3 months. It has gotten worse over the last month. Movement and standing makes the pain worse. Heat helps. His pain medication helps too. He has been walking and moving less as a result. He has not noticed any increase in his lwo back pain.   He has been treated for a UTI and has been on cipro for about 11 days so far. He states he will be on it for a month. He has been followed by a urologist.     Pain Inventory Average Pain 7 Pain Right Now 9 My pain is constant, sharp, dull, stabbing and aching  In the last 24 hours, has pain interfered with the following? General activity 7 Relation with others 5 Enjoyment of life 9 What TIME of day is your pain at its worst? daytime, evening Sleep (in general) Fair  Pain is worse with: walking, bending, sitting, standing and some activites Pain improves with: rest, heat/ice, pacing activities and medication Relief from Meds: 4  Mobility walk with assistance use a cane how many minutes can you walk? 10-15 ability to climb steps?  yes do you drive?  yes Do you have any goals in this area?  no  Function Do you have any goals in this area?  no  Neuro/Psych weakness numbness tingling anxiety  Prior Studies Any changes since last visit?  no  Physicians involved in your care Any changes since last visit?  no   History reviewed. No pertinent family history. Social History   Social History  . Marital status: Widowed    Spouse name: N/A  . Number of children: N/A  . Years of education: N/A   Social History Main Topics  . Smoking status: Former Smoker    Packs/day: 5.00    Years: 23.00    Types: Cigarettes    Quit date: 06/16/1986  . Smokeless tobacco: Never Used  . Alcohol use Yes     Comment: 05/22/2014   "used to drink casually; never had problem w/it; none since ~ 2004"  . Drug use: No  . Sexual activity: No   Other Topics Concern  . None   Social History Narrative  . None   Past Surgical History:  Procedure Laterality Date  . ANAL FISSURE REPAIR    . ANKLE FRACTURE SURGERY Left   . BACK SURGERY    . BONE TISSUE     "I've had removal of excess tissue as a result of my ankylosing spondylitis"  . CARDIAC CATHETERIZATION N/A 06/07/2014   Procedure: Coronary Stent Intervention;  Surgeon: Adrian Prows, MD;  Location: [facility];  Service: Cardiovascular;  Laterality: N/A;  . CARPAL TUNNEL RELEASE Bilateral   . COLON SURGERY    . CORONARY ANGIOPLASTY WITH STENT PLACEMENT  2009; 05/22/2014   ?3; ?1  . cortisone injection    . FRACTURE SURGERY    . LEFT HEART CATHETERIZATION WITH CORONARY ANGIOGRAM N/A 05/22/2014   Procedure: LEFT HEART CATHETERIZATION WITH CORONARY ANGIOGRAM;  Surgeon: Adrian Prows, MD;  Location: [facility];  Service: Cardiovascular;  Laterality: N/A;  . LUMBAR DISC SURGERY  X 2   "ruptured/herniated/growing together"  . PERCUTANEOUS CORONARY STENT INTERVENTION (PCI-S)  05/22/2014   Procedure:  PERCUTANEOUS CORONARY STENT INTERVENTION (PCI-S);  Surgeon: Adrian Prows, MD;  Location: [facility] CATH LAB;  Service: Cardiovascular;;  mid LAD DES  . PERCUTANEOUS CORONARY STENT INTERVENTION (PCI-S)  06/07/2014   des to rca  . SHOULDER ARTHROSCOPY W/ ROTATOR CUFF REPAIR Bilateral   . SMALL INTESTINE SURGERY  1960   "took out 14 inches"  . ULNAR TUNNEL RELEASE Left    Past Medical History:  Diagnosis Date  . Ankylosing spondylitis of cervical region ([place])   . Ankylosing spondylitis of lumbar region ([place])   . Anxiety   . BPH (benign prostatic hypertrophy)   . Bursitis, trochanteric   . Chronic back pain   . Chronic cervical pain   . Coronary artery disease   . Daily headache    "recently; related to RX for my heart" (05/21/2014)  . Depression   . GERD (gastroesophageal  reflux disease)   . Hemochromatosis   . High cholesterol   . History of hiatal hernia   . Hypertension   . 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 Obesity   . Osteoarthritis    "joints" (05/20/2104)  . Rotator cuff syndrome   . Tendonitis   . Type II diabetes mellitus (HCC)    BP 120/72 (BP Location: Left Arm, Patient Position: Sitting, Cuff Size: Normal)   Pulse 90   Resp 14   SpO2 95%   Opioid Risk Score:   Fall Risk Score:  `1  Depression screen PHQ 2/9  Depression screen [facility] 2/9 06/04/2016 04/03/2016 07/22/2015 04/22/2015 05/08/2014 02/09/2014 02/09/2014  Decreased Interest 0 0 0 2 2 0 0  Down, Depressed, Hopeless 0 0 0 0 0 0 0  PHQ - 2 Score 0 0 0 2 2 0 0  Altered sleeping - - - - 0 - -  Tired, decreased energy - - - - 3 - -  Change in appetite - - - - 0 - -  Feeling bad or failure about yourself  - - - - 1 - -  Trouble concentrating - - - - 1 - -  Moving slowly or fidgety/restless - - - - 0 - -  Suicidal thoughts - - - - 0 - -  PHQ-9 Score - - - - 7 - -    Review of Systems  HENT: Negative.   Eyes: Negative.   Respiratory: Negative.   Cardiovascular: Negative.   Gastrointestinal: Negative.   Endocrine: Negative.   Genitourinary: Negative.   Musculoskeletal: Positive for arthralgias, back pain, gait problem, myalgias and neck pain.  Skin: Negative.   Allergic/Immunologic: Negative.   Neurological: Positive for weakness and numbness.       Tingling  Hematological: Negative.   Psychiatric/Behavioral: The patient is nervous/anxious.   All other systems reviewed and are negative.      Objective:   Physical Exam  Constitutional: He is oriented to person, place, and time. He appears well-developed and well-nourished.  HENT:  Head: Normocephalic and atraumatic.  Neck: Normal range of motion. Neck supple.  Cervical Paraspinal Tenderness: C-5-C-6  Cardiovascular: RRR without murmur. No JVD    Pulmonary/Chest: CTA Bilaterally without wheezes or  rales. Normal effort   Musculoskeletal: right upper TFL, gluteus medius? With pain---increased with abduction and figure 4 testing/crossed leg maneuver. Antalgic with weight bearing on right. Uses cane for balance. Favors left leg mostly still Normal Muscle Bulk and Muscle Testing Reveals: Thoracic Paraspinal Tenderness: T-1-T-3 Mainly Right Side Lumbar Paraspinal Tenderness: L-3-L-5 Lower Extremities: Full ROM  Neurological: He is alert and oriented to person, place, and time. Motor exam normal Skin: Skin is warm and dry.  Psychiatric: He has a normal mood and affect.  Nursing note and vitals reviewed.         Assessment & Plan:  1. Ankylosing spondylitis of the cervical and lumbar spine: 06/04/2016 Refilled: Hydrocodone 7.5/325mg  one tablet every 6 hours as needed #120 and MS Contin 60 mg one tablet three times a day #90. Second script given for the following month. We will continue the opioid monitoring program, this consists of regular clinic visits, examinations, urine drug screen, pill counts as well as use of [place] Controlled Substance Reporting System. [place] today.  -uds today   2. Disorder of rotator cuff of both shoulders: Continue HEP as tolerated and Medication Regimen.   3. Cervicalgia/ Cervical Radiculitis: appears controlled at present. Don't think right hip pain is radicular 4. Right TFL strain/overuse injury  -stretches were provided  -ice/heat  -voltaren 75mg  bid for a month then prn afterwards  -emphasized that this is not a hip joint issue. If it's referred pain, it's more likely from his back  25 minutes of face to face patient care time was spent during this visit. All questions were encouraged and answered. Greater than 50% of time during this encounter was spent counseling patient/family in regard to stretching, biomechanics of the pain, treatment plant, etc.    F/U in 2 months

## 2016-08-04 NOTE — Patient Instructions (Signed)
PLEASE FEEL FREE TO CALL OUR OFFICE WITH ANY PROBLEMS OR QUESTIONS ([phone])      

## 2016-08-10 LAB — 6-ACETYLMORPHINE,TOXASSURE ADD
6-ACETYLMORPHINE: NEGATIVE
6-acetylmorphine: NOT DETECTED ng/mg creat

## 2016-08-10 LAB — TOXASSURE SELECT,+ANTIDEPR,UR

## 2016-08-12 ENCOUNTER — Telehealth: Payer: Self-pay | Admitting: *Deleted

## 2016-08-12 NOTE — Telephone Encounter (Signed)
Urine drug screen for this encounter is consistent for prescribed medication. Hydrocodone is reported but absent but metabolite is present.

## 2016-09-16 ENCOUNTER — Other Ambulatory Visit (HOSPITAL_BASED_OUTPATIENT_CLINIC_OR_DEPARTMENT_OTHER): Payer: Medicare Other

## 2016-09-16 LAB — CBC WITH DIFFERENTIAL/PLATELET
BASO%: 0.9 % (ref 0.0–2.0)
Basophils Absolute: 0 10*3/uL (ref 0.0–0.1)
EOS%: 2.2 % (ref 0.0–7.0)
Eosinophils Absolute: 0.1 10*3/uL (ref 0.0–0.5)
HCT: 42.5 % (ref 38.4–49.9)
HEMOGLOBIN: 14.2 g/dL (ref 13.0–17.1)
LYMPH#: 1.4 10*3/uL (ref 0.9–3.3)
LYMPH%: 33.1 % (ref 14.0–49.0)
MCH: 29.4 pg (ref 27.2–33.4)
MCHC: 33.3 g/dL (ref 32.0–36.0)
MCV: 88.3 fL (ref 79.3–98.0)
MONO#: 0.5 10*3/uL (ref 0.1–0.9)
MONO%: 11.8 % (ref 0.0–14.0)
NEUT#: 2.3 10*3/uL (ref 1.5–6.5)
NEUT%: 52 % (ref 39.0–75.0)
Platelets: 126 10*3/uL — ABNORMAL LOW (ref 140–400)
RBC: 4.81 10*6/uL (ref 4.20–5.82)
RDW: 14.5 % (ref 11.0–14.6)
WBC: 4.4 10*3/uL (ref 4.0–10.3)

## 2016-09-16 LAB — COMPREHENSIVE METABOLIC PANEL
ALT: 58 U/L — AB (ref 0–55)
AST: 62 U/L — AB (ref 5–34)
Albumin: 3.7 g/dL (ref 3.5–5.0)
Alkaline Phosphatase: 74 U/L (ref 40–150)
Anion Gap: 9 mEq/L (ref 3–11)
BUN: 16.2 mg/dL (ref 7.0–26.0)
CHLORIDE: 101 meq/L (ref 98–109)
CO2: 28 mEq/L (ref 22–29)
CREATININE: 0.9 mg/dL (ref 0.7–1.3)
Calcium: 10.1 mg/dL (ref 8.4–10.4)
EGFR: 90 mL/min/{1.73_m2} — ABNORMAL LOW (ref >90)
GLUCOSE: 128 mg/dL (ref 70–140)
POTASSIUM: 4.5 meq/L (ref 3.5–5.1)
SODIUM: 138 meq/L (ref 136–145)
Total Bilirubin: 1.39 mg/dL — ABNORMAL HIGH (ref 0.20–1.20)
Total Protein: 7.6 g/dL (ref 6.4–8.3)

## 2016-09-16 LAB — IRON AND TIBC
%SAT: 22 % (ref 20–55)
Iron: 77 ug/dL (ref 42–163)
TIBC: 358 ug/dL (ref 202–409)
UIBC: 281 ug/dL (ref 117–376)

## 2016-09-16 LAB — FERRITIN: Ferritin: 108 ng/ml (ref 22–316)

## 2016-09-18 ENCOUNTER — Ambulatory Visit (HOSPITAL_BASED_OUTPATIENT_CLINIC_OR_DEPARTMENT_OTHER): Payer: Medicare Other | Admitting: Oncology

## 2016-09-18 ENCOUNTER — Telehealth: Payer: Self-pay | Admitting: Oncology

## 2016-09-18 ENCOUNTER — Ambulatory Visit (HOSPITAL_BASED_OUTPATIENT_CLINIC_OR_DEPARTMENT_OTHER): Payer: Medicare Other

## 2016-09-18 VITALS — BP 129/67 | HR 89 | Temp 98.1°F | Resp 18 | Ht 76.0 in | Wt 259.8 lb

## 2016-09-18 VITALS — BP 110/59 | HR 86 | Temp 98.7°F | Resp 20

## 2016-09-18 DIAGNOSIS — Z862 Personal history of diseases of the blood and blood-forming organs and certain disorders involving the immune mechanism: Secondary | ICD-10-CM

## 2016-09-18 DIAGNOSIS — I2510 Atherosclerotic heart disease of native coronary artery without angina pectoris: Secondary | ICD-10-CM

## 2016-09-18 NOTE — Patient Instructions (Signed)
Therapeutic Phlebotomy Therapeutic phlebotomy is the controlled removal of blood from a person's body for the purpose of treating a medical condition. The procedure is similar to donating blood. Usually, about a pint (470 mL, or 0.47L) of blood is removed. The average adult has 9-12 pints (4.3-5.7 L) of blood. Therapeutic phlebotomy may be used to treat the following medical conditions:  Hemochromatosis. This is a condition in which the blood contains too much iron.  Polycythemia vera. This is a condition in which the blood contains too many red blood cells.  Porphyria cutanea tarda. This is a disease in which an important part of hemoglobin is not made properly. It results in the buildup of abnormal amounts of porphyrins in the body.  Sickle cell disease. This is a condition in which the red blood cells form an abnormal crescent shape rather than a round shape.  Tell a health care provider about:  Any allergies you have.  All medicines you are taking, including vitamins, herbs, eye drops, creams, and over-the-counter medicines.  Any problems you or family members have had with anesthetic medicines.  Any blood disorders you have.  Any surgeries you have had.  Any medical conditions you have. What are the risks? Generally, this is a safe procedure. However, problems may occur, including:  Nausea or light-headedness.  Low blood pressure.  Soreness, bleeding, swelling, or bruising at the needle insertion site.  Infection.  What happens before the procedure?  Follow instructions from your health care provider about eating or drinking restrictions.  Ask your health care provider about changing or stopping your regular medicines. This is especially important if you are taking diabetes medicines or blood thinners.  Wear clothing with sleeves that can be raised above the elbow.  Plan to have someone take you home after the procedure.  You may have a blood sample taken. What  happens during the procedure?  A needle will be inserted into one of your veins.  Tubing and a collection bag will be attached to that needle.  Blood will flow through the needle and tubing into the collection bag.  You may be asked to open and close your hand slowly and continually during the entire collection.  After the specified amount of blood has been removed from your body, the collection bag and tubing will be clamped.  The needle will be removed from your vein.  Pressure will be held on the site of the needle insertion to stop the bleeding.  A bandage (dressing) will be placed over the needle insertion site. The procedure may vary among health care providers and hospitals. What happens after the procedure?  Your recovery will be assessed and monitored.  You can return to your normal activities as directed by your health care provider. This information is not intended to replace advice given to you by your health care provider. Make sure you discuss any questions you have with your health care provider. Document Released: 06/23/2010 Document Revised: 09/21/2015 Document Reviewed: 01/15/2014 Elsevier Interactive Patient Education  2018 [employer]vier Inc.  

## 2016-09-18 NOTE — Telephone Encounter (Signed)
Scheduled appt per 8/17 los - patient is aware of appts did not want AVS or calender

## 2016-09-18 NOTE — Progress Notes (Signed)
Hematology and Oncology Follow Up Visit  Daniel Rivers [id number] 06-May-1950 66 y.o. 09/18/2016 9:01 AM [name], MDPang, Richard, MD   Principle Diagnosis: 66 year old with hemochromatosis and liver cirrhosis diagnosed in 2009. He established care at the [facility] in August of 2014. 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   Prior Therapy: He have been receiving intermittent phlebotomies in the past.  Current therapy: phlebotomy to achieve ferritin level close to 50. This has been done every 3 months.  Interim History:  Daniel Rivers presents today for a followup visit. Since his last visit, he reports doing well. He does not report any dizziness, lightheadedness or syncope. He continues to receive phlebotomy every 3 months have tolerated this schedule well. He denied any chest pain, dyspnea on exertion or shortness of breath. His appetite remained reasonable and his blood sugar is under reasonable control. No major changes in his performance status or activity level. Continues to have chronic pain syndrome which is unchanged.  He has not reported any headaches or blurry vision or alteration of his mental status. Has not had any genitourinary complaints of frequency urgency or hesitancy. Has not reported any lymphadenopathy or petechiae. Has not reported any easy bruisability or any bleeding. Report any new skeletal complaints. The rest of his review of symptoms is unremarkable.   Medications: I have reviewed the patient's current medications.  Current Outpatient Prescriptions  Medication Sig Dispense Refill  . ACCU-CHEK FASTCLIX LANCETS MISC     . aspirin 81 MG tablet Take 81 mg by mouth daily.     . bisacodyl (DULCOLAX) 5 MG EC tablet Take 5 mg by mouth at bedtime. 2 at bedtime    . buPROPion (WELLBUTRIN XL) 300 MG 24 hr tablet Take 1 tablet by mouth daily.    . carvedilol (COREG) 3.125 MG tablet Take 3.125 mg by mouth 2 (two) times  daily.    . Cholecalciferol (VITAMIN D3) 2000 UNITS TABS Take 1 tablet by mouth daily.    . clopidogrel (PLAVIX) 75 MG tablet Take 1 tablet by mouth daily.    . diclofenac (VOLTAREN) 75 MG EC tablet Take 1 tablet (75 mg total) by mouth 2 (two) times daily with a meal. 60 tablet 1  . furosemide (LASIX) 20 MG tablet Take 20 mg by mouth as needed for fluid.     [name] glimepiride (AMARYL) 2 MG tablet Take 1 mg by mouth daily before breakfast.     . HYDROcodone-acetaminophen (NORCO) 7.5-325 MG tablet Take 1 tablet by mouth every 6 (six) hours as needed (pain). 1 month supply 120 tablet 0  . KRILL OIL PO Take 1 tablet by mouth daily.     [name] losartan (COZAAR) 100 MG tablet Take 100 mg by mouth daily.    . Magnesium Oxide 400 MG CAPS Take 1 capsule by mouth daily.    . metFORMIN (GLUCOPHAGE) 1000 MG tablet Take 1,000 mg by mouth every 12 (twelve) hours.    [name] morphine (MS CONTIN) 60 MG 12 hr tablet Take 1 tablet (60 mg total) by mouth 3 (three) times daily. 90 tablet 0  . Multiple Vitamin (MULTIVITAMIN) capsule Take 1 capsule by mouth daily. NO IRON    . NITROSTAT 0.4 MG SL tablet Place 0.4 mg under the tongue every 5 (five) minutes as needed for chest pain.     . polyethylene glycol powder (GLYCOLAX/MIRALAX) powder Using measuring cap mix  17gm in water and drink two times daily 3162  g 11  . pravastatin (PRAVACHOL) 80 MG tablet Take 80 mg by mouth daily.    . Psyllium (METAMUCIL PO) Take 17 g/day by mouth 2 (two) times daily.    [name] zolpidem (AMBIEN) 10 MG tablet Take 10 mg by mouth at bedtime as needed for sleep.      No current facility-administered medications for this visit.      Allergies:  Allergies  Allergen Reactions  . Amlodipine Besy-Benazepril Hcl Cough  . Naproxen Other (See Comments)    Worsens Tinnitus    Past Medical History, Surgical history, Social history, and Family History were reviewed and updated.  [name] Physical Exam: Blood pressure 129/67, pulse 89, temperature 98.1 F (36.7  C), temperature source Oral, resp. rate 18, height 6\' 4"  (1.93 m), weight 259 lb 12.8 oz (117.8 kg), SpO2 97 %. ECOG: 1 General appearance: Alert, awake gentleman without distress. Head: Normocephalic, without obvious abnormality no oral ulcers or lesions. Neck: no adenopathy no thyroid masses. Lymph nodes: Cervical, supraclavicular, and axillary nodes normal. Heart:regular rate and rhythm, S1, S2 normal, no murmur, click, rub or gallop Lung:chest clear, no wheezing, rales, normal symmetric air entry. Abdomin: soft, non-tender, without masses or organomegaly no rebound or guarding. EXT:no erythema, induration, or nodules   Lab Results: Lab Results  Component Value Date   WBC 4.4 09/16/2016   HGB 14.2 09/16/2016   HCT 42.5 09/16/2016   MCV 88.3 09/16/2016   PLT 126 (L) 09/16/2016     Chemistry      Component Value Date/Time   NA 138 09/16/2016 1016   K 4.5 09/16/2016 1016   CL 105 06/08/2014 0342   CO2 28 09/16/2016 1016   BUN 16.2 09/16/2016 1016   CREATININE 0.9 09/16/2016 1016      Component Value Date/Time   CALCIUM 10.1 09/16/2016 1016   ALKPHOS 74 09/16/2016 1016   AST 62 (H) 09/16/2016 1016   ALT 58 (H) 09/16/2016 1016   BILITOT 1.39 (H) 09/16/2016 1016       Results for MESSI, TWEDT (MRN [id number]) as of 09/18/2016 08:49  Ref. Range 12/06/2015 08:36 03/04/2016 09:16 06/03/2016 09:46 09/16/2016 10:16  Ferritin Latest Ref Range: 22 - 316 ng/ml 66 72 65 108       Impression and Plan:  66 year old gentleman with the following issues:  1. Hemachromatosis: He has double heterozygous mutation with the risk of iron overload is reasonably well. He is currently on phlebotomy every 3 months to keep his ferritin close to 50.   Ferritin on 06/03/2016 was 108 with normal hemoglobin. Risks and benefits of continuing this procedure were reviewed today and is agreeable to continue with the same schedule.   We will continue to monitor his ferritin every 3 months and  proceed with phlebotomy to get him close to 50. I see no need to increase the frequency of phlebotomy at this time.  2. Cirrhosis of the liver: He follows up with gastroenterology regarding that. His liver function tests are stable without any major changes. He does not exhibit any stigmata of worsening liver disease.  3. 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 Platelet count continues to be within the safe range.  4. Coronary artery disease: Status post catheterization and stenting. He is off Plavix at this time.  5. Follow-up: Will be in 3 months for possible repeat phlebotomy.   [facility], MD 8/17/20189:01 AM

## 2016-10-02 DIAGNOSIS — Z5181 Encounter for therapeutic drug level monitoring: Secondary | ICD-10-CM | POA: Diagnosis not present

## 2016-10-02 DIAGNOSIS — E1165 Type 2 diabetes mellitus with hyperglycemia: Secondary | ICD-10-CM | POA: Diagnosis not present

## 2016-10-02 DIAGNOSIS — M5442 Lumbago with sciatica, left side: Secondary | ICD-10-CM | POA: Diagnosis not present

## 2016-10-02 DIAGNOSIS — I10 Essential (primary) hypertension: Secondary | ICD-10-CM | POA: Diagnosis not present

## 2016-10-06 ENCOUNTER — Other Ambulatory Visit: Payer: Self-pay | Admitting: Physical Medicine & Rehabilitation

## 2016-10-06 DIAGNOSIS — M7631 Iliotibial band syndrome, right leg: Secondary | ICD-10-CM

## 2016-10-07 ENCOUNTER — Encounter: Payer: Self-pay | Admitting: Registered Nurse

## 2016-10-07 ENCOUNTER — Telehealth: Payer: Self-pay | Admitting: Registered Nurse

## 2016-10-07 ENCOUNTER — Encounter: Payer: Medicare Other | Attending: Physical Medicine & Rehabilitation | Admitting: Registered Nurse

## 2016-10-07 VITALS — BP 123/71 | HR 78

## 2016-10-07 DIAGNOSIS — K449 Diaphragmatic hernia without obstruction or gangrene: Secondary | ICD-10-CM | POA: Insufficient documentation

## 2016-10-07 DIAGNOSIS — G894 Chronic pain syndrome: Secondary | ICD-10-CM | POA: Diagnosis not present

## 2016-10-07 DIAGNOSIS — M7061 Trochanteric bursitis, right hip: Secondary | ICD-10-CM

## 2016-10-07 DIAGNOSIS — G8929 Other chronic pain: Secondary | ICD-10-CM | POA: Insufficient documentation

## 2016-10-07 DIAGNOSIS — M109 Gout, unspecified: Secondary | ICD-10-CM | POA: Diagnosis not present

## 2016-10-07 DIAGNOSIS — E669 Obesity, unspecified: Secondary | ICD-10-CM | POA: Diagnosis not present

## 2016-10-07 DIAGNOSIS — Z5181 Encounter for therapeutic drug level monitoring: Secondary | ICD-10-CM | POA: Diagnosis not present

## 2016-10-07 DIAGNOSIS — M452 Ankylosing spondylitis of cervical region: Secondary | ICD-10-CM | POA: Insufficient documentation

## 2016-10-07 DIAGNOSIS — Z79899 Other long term (current) drug therapy: Secondary | ICD-10-CM | POA: Diagnosis not present

## 2016-10-07 DIAGNOSIS — E7800 Pure hypercholesterolemia, unspecified: Secondary | ICD-10-CM | POA: Insufficient documentation

## 2016-10-07 DIAGNOSIS — M7062 Trochanteric bursitis, left hip: Secondary | ICD-10-CM | POA: Diagnosis not present

## 2016-10-07 DIAGNOSIS — K219 Gastro-esophageal reflux disease without esophagitis: Secondary | ICD-10-CM | POA: Diagnosis not present

## 2016-10-07 DIAGNOSIS — F329 Major depressive disorder, single episode, unspecified: Secondary | ICD-10-CM | POA: Insufficient documentation

## 2016-10-07 DIAGNOSIS — M456 Ankylosing spondylitis lumbar region: Secondary | ICD-10-CM | POA: Insufficient documentation

## 2016-10-07 DIAGNOSIS — Z955 Presence of coronary angioplasty implant and graft: Secondary | ICD-10-CM | POA: Insufficient documentation

## 2016-10-07 DIAGNOSIS — M67911 Unspecified disorder of synovium and tendon, right shoulder: Secondary | ICD-10-CM | POA: Diagnosis not present

## 2016-10-07 DIAGNOSIS — I2510 Atherosclerotic heart disease of native coronary artery without angina pectoris: Secondary | ICD-10-CM | POA: Diagnosis not present

## 2016-10-07 DIAGNOSIS — N400 Enlarged prostate without lower urinary tract symptoms: Secondary | ICD-10-CM | POA: Insufficient documentation

## 2016-10-07 DIAGNOSIS — Z87891 Personal history of nicotine dependence: Secondary | ICD-10-CM | POA: Diagnosis not present

## 2016-10-07 DIAGNOSIS — M542 Cervicalgia: Secondary | ICD-10-CM

## 2016-10-07 DIAGNOSIS — M5412 Radiculopathy, cervical region: Secondary | ICD-10-CM | POA: Diagnosis not present

## 2016-10-07 DIAGNOSIS — M67912 Unspecified disorder of synovium and tendon, left shoulder: Secondary | ICD-10-CM | POA: Diagnosis not present

## 2016-10-07 DIAGNOSIS — M7522 Bicipital tendinitis, left shoulder: Secondary | ICD-10-CM | POA: Diagnosis not present

## 2016-10-07 DIAGNOSIS — E119 Type 2 diabetes mellitus without complications: Secondary | ICD-10-CM | POA: Insufficient documentation

## 2016-10-07 DIAGNOSIS — F419 Anxiety disorder, unspecified: Secondary | ICD-10-CM | POA: Insufficient documentation

## 2016-10-07 DIAGNOSIS — M455 Ankylosing spondylitis of thoracolumbar region: Secondary | ICD-10-CM

## 2016-10-07 DIAGNOSIS — I10 Essential (primary) hypertension: Secondary | ICD-10-CM | POA: Insufficient documentation

## 2016-10-07 DIAGNOSIS — M1990 Unspecified osteoarthritis, unspecified site: Secondary | ICD-10-CM | POA: Insufficient documentation

## 2016-10-07 DIAGNOSIS — M545 Low back pain: Secondary | ICD-10-CM | POA: Diagnosis present

## 2016-10-07 DIAGNOSIS — M457 Ankylosing spondylitis of lumbosacral region: Secondary | ICD-10-CM

## 2016-10-07 MED ORDER — HYDROCODONE-ACETAMINOPHEN 7.5-325 MG PO TABS: 1.0000 | ORAL_TABLET | Freq: Four times a day (QID) | ORAL | 0 refills | Status: DC | PRN

## 2016-10-07 MED ORDER — MORPHINE SULFATE ER 60 MG PO TBCR: 60.0000 mg | EXTENDED_RELEASE_TABLET | Freq: Three times a day (TID) | ORAL | 0 refills | Status: DC

## 2016-10-07 NOTE — Telephone Encounter (Signed)
On 10/07/2016 theNCCSR was reviewed no conflict was seen on the [place] with multiple prescribers. Daniel Rivers has a signed narcotic contract with our office. If there were any discrepancies this would have been reported to his physician.

## 2016-10-07 NOTE — Progress Notes (Signed)
Subjective:    Patient ID: Daniel Rivers, male    DOB: 09-25-1950, 66 y.o.   MRN: [id number]  HPI: [name] is a 66year old male who returns for chronic pain and medication refill. He states his pain is located in his neck radiating into his bilatera shoulders, lower back pain, bilateral hips R>L and lower extremities. He rates his pain 7. His current exercise regime is performing stretching exercises and walking in his home.   Last UDS was performed on 08/04/2016 it was consistent.   Pain Inventory Average Pain 6 Pain Right Now 7 My pain is sharp, burning, dull, stabbing, tingling and aching  In the last 24 hours, has pain interfered with the following? General activity 4 Relation with others 3 Enjoyment of life 6 What TIME of day is your pain at its worst? daytime and evening Sleep (in general) Fair  Pain is worse with: all Pain improves with: rest, heat/ice, pacing activities and medication Relief from Meds: 5  Mobility use a cane how many minutes can you walk? 20  Function Do you have any goals in this area?  no  Neuro/Psych weakness numbness tingling confusion  Prior Studies Any changes since last visit?  no  Physicians involved in your care Any changes since last visit?  no   No family history on file. Social History   Social History  . Marital status: Widowed    Spouse name: N/A  . Number of children: N/A  . Years of education: N/A   Social History Main Topics  . Smoking status: Former Smoker    Packs/day: 5.00    Years: 23.00    Types: Cigarettes    Quit date: 06/16/1986  . Smokeless tobacco: Never Used  . Alcohol use Yes     Comment: 05/22/2014  "used to drink casually; never had problem w/it; none since ~ 2004"  . Drug use: No  . Sexual activity: No   Other Topics Concern  . None   Social History Narrative  . None   Past Surgical History:  Procedure Laterality Date  . ANAL FISSURE REPAIR    . ANKLE FRACTURE SURGERY  Left   . BACK SURGERY    . BONE TISSUE     "I've had removal of excess tissue as a result of my ankylosing spondylitis"  . CARDIAC CATHETERIZATION N/A 06/07/2014   Procedure: Coronary Stent Intervention;  Surgeon: Adrian Prows, MD;  Location: [facility];  Service: Cardiovascular;  Laterality: N/A;  . CARPAL TUNNEL RELEASE Bilateral   . COLON SURGERY    . CORONARY ANGIOPLASTY WITH STENT PLACEMENT  2009; 05/22/2014   ?3; ?1  . cortisone injection    . FRACTURE SURGERY    . LEFT HEART CATHETERIZATION WITH CORONARY ANGIOGRAM N/A 05/22/2014   Procedure: LEFT HEART CATHETERIZATION WITH CORONARY ANGIOGRAM;  Surgeon: Adrian Prows, MD;  Location: [facility] CATH LAB;  Service: Cardiovascular;  Laterality: N/A;  . LUMBAR DISC SURGERY  X 2   "ruptured/herniated/growing together"  . PERCUTANEOUS CORONARY STENT INTERVENTION (PCI-S)  05/22/2014   Procedure: PERCUTANEOUS CORONARY STENT INTERVENTION (PCI-S);  Surgeon: Adrian Prows, MD;  Location: [facility] CATH LAB;  Service: Cardiovascular;;  mid LAD DES  . PERCUTANEOUS CORONARY STENT INTERVENTION (PCI-S)  06/07/2014   des to rca  . SHOULDER ARTHROSCOPY W/ ROTATOR CUFF REPAIR Bilateral   . SMALL INTESTINE SURGERY  1960   "took out 14 inches"  . ULNAR TUNNEL RELEASE Left    Past Medical  History:  Diagnosis Date  . Ankylosing spondylitis of cervical region ([place])   . Ankylosing spondylitis of lumbar region ([place])   . Anxiety   . BPH (benign prostatic hypertrophy)   . Bursitis, trochanteric   . Chronic back pain   . Chronic cervical pain   . Coronary artery disease   . Daily headache    "recently; related to RX for my heart" (05/21/2014)  . Depression   . GERD (gastroesophageal reflux disease)   . Hemochromatosis   . High cholesterol   . History of hiatal hernia   . Hypertension   . 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 Obesity   . Osteoarthritis    "joints" (05/20/2104)  . Rotator cuff syndrome   . Tendonitis   . Type II  diabetes mellitus (HCC)    BP 123/71 (BP Location: Left Arm, Patient Position: Sitting, Cuff Size: Normal)   Pulse 78   SpO2 95%   Opioid Risk Score:  0 Fall Risk Score:  `1  Depression screen PHQ 2/9  Depression screen [facility] 2/9 10/07/2016 06/04/2016 04/03/2016 07/22/2015 04/22/2015 05/08/2014 02/09/2014  Decreased Interest 0 0 0 0 2 2 0  Down, Depressed, Hopeless 0 0 0 0 0 0 0  PHQ - 2 Score 0 0 0 0 2 2 0  Altered sleeping - - - - - 0 -  Tired, decreased energy - - - - - 3 -  Change in appetite - - - - - 0 -  Feeling bad or failure about yourself  - - - - - 1 -  Trouble concentrating - - - - - 1 -  Moving slowly or fidgety/restless - - - - - 0 -  Suicidal thoughts - - - - - 0 -  PHQ-9 Score - - - - - 7 -    Review of Systems  HENT: Negative.   Eyes: Negative.   Respiratory: Negative.   Cardiovascular: Negative.   Gastrointestinal: Negative.   Endocrine: Negative.   Genitourinary: Negative.   Musculoskeletal: Negative.   Skin: Negative.   Neurological: Positive for weakness and numbness.       Tingling  Hematological: Negative.   Psychiatric/Behavioral: Positive for confusion.  All other systems reviewed and are negative.      Objective:   Physical Exam  Constitutional: He is oriented to person, place, and time. He appears well-developed and well-nourished.  HENT:  Head: Normocephalic and atraumatic.  Neck: Normal range of motion. Neck supple.  Cervical Paraspinal Tenderness: C-5-C-6 Mainly Right Side  Cardiovascular: Normal rate and regular rhythm.   Pulmonary/Chest: Effort normal and breath sounds normal.  Musculoskeletal:  Normal Muscle Bulk and Muscle Testing Reveals: Upper Extremities: Full ROM and Muscle Strength 5/5 Thoracic Paraspinal Tenderness: T-1-T-3 Lumbar Paraspinal Tenderness: L-3-L-5 Lower Extremities: Full ROM and Muscle Strength 5/5 Arises from chair with ease Narrow Based Gait  Neurological: He is alert and oriented to person, place, and time.  Skin:  Skin is warm and dry.  Psychiatric: He has a normal mood and affect.  Nursing note and vitals reviewed.         Assessment & Plan:  1. Ankylosing spondylitis of the cervical and lumbar spine: 10/07/2016 Refilled: Hydrocodone 7.5/325mg  one tablet every 6 hours as needed #120 and MS Contin 60 mg one tablet three times a day #90. Second script given for the following month.  2. Disorder of rotator cuff of both shoulders: Continue HEP as tolerated and Medication Regimen. 10/07/2016  3. Cervicalgia/ Cervical Radiculitis: Refuses Gabapentin. Continue to Monitor. 10/05/2016. 4. Bilateral Greater Trochanteric Bursitis: Continue Ice and Heat Therapy. 10/07/2016.  20 minutes of face to face patient care time was spent during this visit. All questions were encouraged and answered.  F/U in 2 months

## 2016-10-07 NOTE — Telephone Encounter (Signed)
Error

## 2016-10-12 DIAGNOSIS — M459 Ankylosing spondylitis of unspecified sites in spine: Secondary | ICD-10-CM | POA: Diagnosis not present

## 2016-10-12 DIAGNOSIS — I10 Essential (primary) hypertension: Secondary | ICD-10-CM | POA: Diagnosis not present

## 2016-10-12 DIAGNOSIS — E7800 Pure hypercholesterolemia, unspecified: Secondary | ICD-10-CM | POA: Diagnosis not present

## 2016-10-12 DIAGNOSIS — E1165 Type 2 diabetes mellitus with hyperglycemia: Secondary | ICD-10-CM | POA: Diagnosis not present

## 2016-10-12 DIAGNOSIS — Z23 Encounter for immunization: Secondary | ICD-10-CM | POA: Diagnosis not present

## 2016-10-19 DIAGNOSIS — M79642 Pain in left hand: Secondary | ICD-10-CM | POA: Diagnosis not present

## 2016-10-19 DIAGNOSIS — M459 Ankylosing spondylitis of unspecified sites in spine: Secondary | ICD-10-CM | POA: Diagnosis not present

## 2016-10-19 DIAGNOSIS — M47816 Spondylosis without myelopathy or radiculopathy, lumbar region: Secondary | ICD-10-CM | POA: Diagnosis not present

## 2016-10-19 DIAGNOSIS — M79641 Pain in right hand: Secondary | ICD-10-CM | POA: Diagnosis not present

## 2016-10-19 DIAGNOSIS — M47812 Spondylosis without myelopathy or radiculopathy, cervical region: Secondary | ICD-10-CM | POA: Diagnosis not present

## 2016-10-19 DIAGNOSIS — M549 Dorsalgia, unspecified: Secondary | ICD-10-CM | POA: Diagnosis not present

## 2016-10-19 DIAGNOSIS — M542 Cervicalgia: Secondary | ICD-10-CM | POA: Diagnosis not present

## 2016-10-19 DIAGNOSIS — M546 Pain in thoracic spine: Secondary | ICD-10-CM | POA: Diagnosis not present

## 2016-10-19 DIAGNOSIS — M2550 Pain in unspecified joint: Secondary | ICD-10-CM | POA: Diagnosis not present

## 2016-10-19 DIAGNOSIS — M545 Low back pain: Secondary | ICD-10-CM | POA: Diagnosis not present

## 2016-10-19 DIAGNOSIS — M25519 Pain in unspecified shoulder: Secondary | ICD-10-CM | POA: Diagnosis not present

## 2016-10-23 ENCOUNTER — Other Ambulatory Visit: Payer: Self-pay | Admitting: Rheumatology

## 2016-10-23 DIAGNOSIS — M545 Low back pain, unspecified: Secondary | ICD-10-CM

## 2016-10-23 DIAGNOSIS — M546 Pain in thoracic spine: Secondary | ICD-10-CM

## 2016-10-23 DIAGNOSIS — M542 Cervicalgia: Secondary | ICD-10-CM

## 2016-10-23 DIAGNOSIS — M459 Ankylosing spondylitis of unspecified sites in spine: Secondary | ICD-10-CM

## 2016-11-05 ENCOUNTER — Ambulatory Visit
Admission: RE | Admit: 2016-11-05 | Discharge: 2016-11-05 | Disposition: A | Discharge status: Home / Self Care | Payer: Medicare Other | Source: Ambulatory Visit | Attending: Rheumatology | Admitting: Rheumatology

## 2016-11-05 DIAGNOSIS — M546 Pain in thoracic spine: Secondary | ICD-10-CM

## 2016-11-05 DIAGNOSIS — M47814 Spondylosis without myelopathy or radiculopathy, thoracic region: Secondary | ICD-10-CM | POA: Diagnosis not present

## 2016-11-05 DIAGNOSIS — M545 Low back pain, unspecified: Secondary | ICD-10-CM

## 2016-11-05 DIAGNOSIS — M5126 Other intervertebral disc displacement, lumbar region: Secondary | ICD-10-CM | POA: Diagnosis not present

## 2016-11-09 ENCOUNTER — Ambulatory Visit
Admission: RE | Admit: 2016-11-09 | Discharge: 2016-11-09 | Disposition: A | Discharge status: Home / Self Care | Payer: Medicare Other | Source: Ambulatory Visit | Attending: Rheumatology | Admitting: Rheumatology

## 2016-11-09 DIAGNOSIS — M459 Ankylosing spondylitis of unspecified sites in spine: Secondary | ICD-10-CM

## 2016-11-09 DIAGNOSIS — M542 Cervicalgia: Secondary | ICD-10-CM

## 2016-11-09 DIAGNOSIS — M4802 Spinal stenosis, cervical region: Secondary | ICD-10-CM | POA: Diagnosis not present

## 2016-11-09 MED ORDER — GADOBENATE DIMEGLUMINE 529 MG/ML IV SOLN: 20.0000 mL | Freq: Once | INTRAVENOUS | Status: DC | PRN

## 2016-11-16 DIAGNOSIS — M459 Ankylosing spondylitis of unspecified sites in spine: Secondary | ICD-10-CM | POA: Diagnosis not present

## 2016-11-16 DIAGNOSIS — M542 Cervicalgia: Secondary | ICD-10-CM | POA: Diagnosis not present

## 2016-11-16 DIAGNOSIS — M549 Dorsalgia, unspecified: Secondary | ICD-10-CM | POA: Diagnosis not present

## 2016-11-16 DIAGNOSIS — M1990 Unspecified osteoarthritis, unspecified site: Secondary | ICD-10-CM | POA: Diagnosis not present

## 2016-11-16 DIAGNOSIS — M25519 Pain in unspecified shoulder: Secondary | ICD-10-CM | POA: Diagnosis not present

## 2016-11-16 DIAGNOSIS — M2550 Pain in unspecified joint: Secondary | ICD-10-CM | POA: Diagnosis not present

## 2016-12-03 ENCOUNTER — Telehealth: Payer: Self-pay | Admitting: Registered Nurse

## 2016-12-03 ENCOUNTER — Encounter: Payer: Medicare Other | Attending: Physical Medicine & Rehabilitation | Admitting: Registered Nurse

## 2016-12-03 ENCOUNTER — Encounter: Payer: Self-pay | Admitting: Registered Nurse

## 2016-12-03 VITALS — BP 118/70 | HR 83

## 2016-12-03 DIAGNOSIS — M1990 Unspecified osteoarthritis, unspecified site: Secondary | ICD-10-CM | POA: Insufficient documentation

## 2016-12-03 DIAGNOSIS — K219 Gastro-esophageal reflux disease without esophagitis: Secondary | ICD-10-CM | POA: Insufficient documentation

## 2016-12-03 DIAGNOSIS — E119 Type 2 diabetes mellitus without complications: Secondary | ICD-10-CM | POA: Insufficient documentation

## 2016-12-03 DIAGNOSIS — M7062 Trochanteric bursitis, left hip: Secondary | ICD-10-CM | POA: Diagnosis not present

## 2016-12-03 DIAGNOSIS — Z955 Presence of coronary angioplasty implant and graft: Secondary | ICD-10-CM | POA: Insufficient documentation

## 2016-12-03 DIAGNOSIS — E7800 Pure hypercholesterolemia, unspecified: Secondary | ICD-10-CM | POA: Diagnosis not present

## 2016-12-03 DIAGNOSIS — I10 Essential (primary) hypertension: Secondary | ICD-10-CM | POA: Diagnosis not present

## 2016-12-03 DIAGNOSIS — G894 Chronic pain syndrome: Secondary | ICD-10-CM | POA: Diagnosis not present

## 2016-12-03 DIAGNOSIS — M545 Low back pain: Secondary | ICD-10-CM | POA: Diagnosis not present

## 2016-12-03 DIAGNOSIS — K449 Diaphragmatic hernia without obstruction or gangrene: Secondary | ICD-10-CM | POA: Diagnosis not present

## 2016-12-03 DIAGNOSIS — F329 Major depressive disorder, single episode, unspecified: Secondary | ICD-10-CM | POA: Insufficient documentation

## 2016-12-03 DIAGNOSIS — Z87891 Personal history of nicotine dependence: Secondary | ICD-10-CM | POA: Insufficient documentation

## 2016-12-03 DIAGNOSIS — I2510 Atherosclerotic heart disease of native coronary artery without angina pectoris: Secondary | ICD-10-CM | POA: Diagnosis not present

## 2016-12-03 DIAGNOSIS — M109 Gout, unspecified: Secondary | ICD-10-CM | POA: Insufficient documentation

## 2016-12-03 DIAGNOSIS — M455 Ankylosing spondylitis of thoracolumbar region: Secondary | ICD-10-CM | POA: Diagnosis not present

## 2016-12-03 DIAGNOSIS — M7522 Bicipital tendinitis, left shoulder: Secondary | ICD-10-CM | POA: Diagnosis not present

## 2016-12-03 DIAGNOSIS — M7061 Trochanteric bursitis, right hip: Secondary | ICD-10-CM | POA: Diagnosis not present

## 2016-12-03 DIAGNOSIS — M457 Ankylosing spondylitis of lumbosacral region: Secondary | ICD-10-CM | POA: Diagnosis not present

## 2016-12-03 DIAGNOSIS — N400 Enlarged prostate without lower urinary tract symptoms: Secondary | ICD-10-CM | POA: Insufficient documentation

## 2016-12-03 DIAGNOSIS — Z79899 Other long term (current) drug therapy: Secondary | ICD-10-CM

## 2016-12-03 DIAGNOSIS — M456 Ankylosing spondylitis lumbar region: Secondary | ICD-10-CM | POA: Insufficient documentation

## 2016-12-03 DIAGNOSIS — E669 Obesity, unspecified: Secondary | ICD-10-CM | POA: Diagnosis not present

## 2016-12-03 DIAGNOSIS — Z5181 Encounter for therapeutic drug level monitoring: Secondary | ICD-10-CM

## 2016-12-03 DIAGNOSIS — F419 Anxiety disorder, unspecified: Secondary | ICD-10-CM | POA: Insufficient documentation

## 2016-12-03 DIAGNOSIS — M452 Ankylosing spondylitis of cervical region: Secondary | ICD-10-CM | POA: Insufficient documentation

## 2016-12-03 DIAGNOSIS — M542 Cervicalgia: Secondary | ICD-10-CM

## 2016-12-03 DIAGNOSIS — G8929 Other chronic pain: Secondary | ICD-10-CM | POA: Insufficient documentation

## 2016-12-03 DIAGNOSIS — M5412 Radiculopathy, cervical region: Secondary | ICD-10-CM | POA: Diagnosis not present

## 2016-12-03 MED ORDER — MORPHINE SULFATE ER 60 MG PO TBCR: 60.0000 mg | EXTENDED_RELEASE_TABLET | Freq: Three times a day (TID) | ORAL | 0 refills | Status: DC

## 2016-12-03 MED ORDER — HYDROCODONE-ACETAMINOPHEN 7.5-325 MG PO TABS: 1.0000 | ORAL_TABLET | Freq: Four times a day (QID) | ORAL | 0 refills | Status: DC | PRN

## 2016-12-03 NOTE — Telephone Encounter (Signed)
On 12/03/2016 the  [place] was reviewed no conflict was seen on the [place] with multiple prescribers. Daniel Rivers  has a signed narcotic contract with our office. If there were any discrepancies this would have been reported to his physician.

## 2016-12-03 NOTE — Progress Notes (Signed)
Subjective:    Patient ID: Daniel Rivers, male    DOB: [birth year], 66 y.o.   MRN: [id number]  HPI: Daniel Rivers is a 66year old male who returns for chronic pain and medication refill. He states his pain is located in his neck radiating into his bilateral shoulders, lower back pain and bilateral hips. He rates his pain 6. His current exercise regime is performing stretching exercises and walking in his home.   Mr. Graser Morphine equivalent is 210.00 MME.    Last UDS was performed on 08/04/2016 it was consistent. Oral Swab performed today.  Pain Inventory Average Pain 6 Pain Right Now 6 My pain is sharp, burning, dull, stabbing, tingling and aching  In the last 24 hours, has pain interfered with the following? General activity 7 Relation with others 5 Enjoyment of life 7 What TIME of day is your pain at its worst? daytime and evening Sleep (in general) Fair  Pain is worse with: walking, bending, sitting, inactivity, standing and unsure Pain improves with: rest, heat/ice, therapy/exercise, pacing activities and medication Relief from Meds: 4  Mobility use a cane how many minutes can you walk? 10-15 ability to climb steps?  yes do you drive?  yes  Function Do you have any goals in this area?  no  Neuro/Psych weakness numbness tingling trouble walking confusion  Prior Studies Any changes since last visit?  no  Physicians involved in your care Any changes since last visit?  no   No family history on file. Social History   Social History  . Marital status: Widowed    Spouse name: N/A  . Number of children: N/A  . Years of education: N/A   Social History Main Topics  . Smoking status: Former Smoker    Packs/day: 5.00    Years: 23.00    Types: Cigarettes    Quit date: 06/16/1986  . Smokeless tobacco: Never Used  . Alcohol use Yes     Comment: 05/22/2014  "used to drink casually; never had problem w/it; none since ~ 2004"  . Drug use: No  .  Sexual activity: No   Other Topics Concern  . None   Social History Narrative  . None   Past Surgical History:  Procedure Laterality Date  . ANAL FISSURE REPAIR    . ANKLE FRACTURE SURGERY Left   . BACK SURGERY    . BONE TISSUE     "I've had removal of excess tissue as a result of my ankylosing spondylitis"  . CARDIAC CATHETERIZATION N/A 06/07/2014   Procedure: Coronary Stent Intervention;  Surgeon: Adrian Prows, MD;  Location: [facility];  Service: Cardiovascular;  Laterality: N/A;  . CARPAL TUNNEL RELEASE Bilateral   . COLON SURGERY    . CORONARY ANGIOPLASTY WITH STENT PLACEMENT  2009; 05/22/2014   ?3; ?1  . cortisone injection    . FRACTURE SURGERY    . LEFT HEART CATHETERIZATION WITH CORONARY ANGIOGRAM N/A 05/22/2014   Procedure: LEFT HEART CATHETERIZATION WITH CORONARY ANGIOGRAM;  Surgeon: Adrian Prows, MD;  Location: [facility] CATH LAB;  Service: Cardiovascular;  Laterality: N/A;  . LUMBAR DISC SURGERY  X 2   "ruptured/herniated/growing together"  . PERCUTANEOUS CORONARY STENT INTERVENTION (PCI-S)  05/22/2014   Procedure: PERCUTANEOUS CORONARY STENT INTERVENTION (PCI-S);  Surgeon: Adrian Prows, MD;  Location: [facility] CATH LAB;  Service: Cardiovascular;;  mid LAD DES  . PERCUTANEOUS CORONARY STENT INTERVENTION (PCI-S)  06/07/2014   des to rca  . SHOULDER ARTHROSCOPY  W/ ROTATOR CUFF REPAIR Bilateral   . SMALL INTESTINE SURGERY  1960   "took out 14 inches"  . ULNAR TUNNEL RELEASE Left    Past Medical History:  Diagnosis Date  . Ankylosing spondylitis of cervical region ([place])   . Ankylosing spondylitis of lumbar region ([place])   . Anxiety   . BPH (benign prostatic hypertrophy)   . Bursitis, trochanteric   . Chronic back pain   . Chronic cervical pain   . Coronary artery disease   . Daily headache    "recently; related to RX for my heart" (05/21/2014)  . Depression   . GERD (gastroesophageal reflux disease)   . Hemochromatosis   . High cholesterol   . History of hiatal hernia     . Hypertension   . 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 Obesity   . Osteoarthritis    "joints" (05/20/2104)  . Rotator cuff syndrome   . Tendonitis   . Type II diabetes mellitus (HCC)    BP 118/70   Pulse 83   SpO2 96%   Opioid Risk Score:  0 Fall Risk Score:  `1  Depression screen PHQ 2/9  Depression screen [facility] 2/9 12/03/2016 10/07/2016 06/04/2016 04/03/2016 07/22/2015 04/22/2015 05/08/2014  Decreased Interest 0 0 0 0 0 2 2  Down, Depressed, Hopeless 0 0 0 0 0 0 0  PHQ - 2 Score 0 0 0 0 0 2 2  Altered sleeping - - - - - - 0  Tired, decreased energy - - - - - - 3  Change in appetite - - - - - - 0  Feeling bad or failure about yourself  - - - - - - 1  Trouble concentrating - - - - - - 1  Moving slowly or fidgety/restless - - - - - - 0  Suicidal thoughts - - - - - - 0  PHQ-9 Score - - - - - - 7    Review of Systems  HENT: Negative.   Eyes: Negative.   Respiratory: Negative.   Cardiovascular: Negative.   Gastrointestinal: Negative.   Endocrine: Negative.   Genitourinary: Negative.   Musculoskeletal: Negative.   Skin: Negative.   Neurological: Positive for weakness and numbness.       Tingling  Hematological: Negative.   Psychiatric/Behavioral: Positive for confusion.  All other systems reviewed and are negative.      Objective:   Physical Exam  Constitutional: He is oriented to person, place, and time. He appears well-developed and well-nourished.  HENT:  Head: Normocephalic and atraumatic.  Neck: Normal range of motion. Neck supple.  Cervical Paraspinal Tenderness: C-5-C-6  Cardiovascular: Normal rate and regular rhythm.   Pulmonary/Chest: Effort normal and breath sounds normal.  Musculoskeletal:  Normal Muscle Bulk and Muscle Testing Reveals: Upper Extremities: Full ROM and Muscle Strength 5/5 Bilateral AC Joint Tenderness R>L Thoracic and Lumbar Hypersensitivity Lower Extremities: Full ROM and Muscle Strength 5/5 Right Lower Extremity  Flexion Produces Pain into right foot Left Lower Extremity Flexion Produces Pain into Left Hip and Left Patella Arises from chair with ease Narrow Based Gait   Neurological: He is alert and oriented to person, place, and time.  Skin: Skin is warm and dry.  Psychiatric: He has a normal mood and affect.  Nursing note and vitals reviewed.         Assessment & Plan:  1. Ankylosing spondylitis of the cervical and lumbar spine: 12/03/2016 Refilled: Hydrocodone 7.5/325mg  one tablet every  6 hours as needed #120 and MS Contin 60 mg one tablet three times a day #90. Second script given for the following month.  2. Disorder of rotator cuff of both shoulders: Continue HEP as tolerated and Medication Regimen. 12/03/2016 3. Cervicalgia/ Cervical Radiculitis: Refuses Gabapentin. Continue to Monitor. 12/03/2016. 4. Bilateral Greater Trochanteric Bursitis: Continue Ice and Heat Therapy. 12/03/2016.  20 minutes of face to face patient care time was spent during this visit. All questions were encouraged and answered.  F/U in 2 months

## 2016-12-09 LAB — DRUG TOX MONITOR 1 W/CONF, ORAL FLD
AMPHETAMINES: NEGATIVE ng/mL (ref <10)
BARBITURATES: NEGATIVE ng/mL (ref <10)
BUPRENORPHINE: NEGATIVE ng/mL (ref <0.10)
Benzodiazepines: NEGATIVE ng/mL (ref <0.50)
COCAINE: NEGATIVE ng/mL (ref <5.0)
Codeine: NEGATIVE ng/mL (ref <2.5)
Dihydrocodeine: NEGATIVE ng/mL (ref <2.5)
FENTANYL: NEGATIVE ng/mL (ref <0.10)
HEROIN METABOLITE: NEGATIVE ng/mL (ref <1.0)
Hydrocodone: NEGATIVE ng/mL (ref <2.5)
Hydromorphone: NEGATIVE ng/mL (ref <2.5)
MARIJUANA: NEGATIVE ng/mL (ref <2.5)
MDMA: NEGATIVE ng/mL (ref <10)
MEPROBAMATE: NEGATIVE ng/mL (ref <2.5)
METHADONE: NEGATIVE ng/mL (ref <5.0)
MORPHINE: 52 ng/mL — AB (ref <2.5)
NICOTINE METABOLITE: NEGATIVE ng/mL (ref <5.0)
NORHYDROCODONE: NEGATIVE ng/mL (ref <2.5)
NOROXYCODONE: NEGATIVE ng/mL (ref <2.5)
OXYCODONE: NEGATIVE ng/mL (ref <2.5)
Opiates: POSITIVE ng/mL — AB (ref <2.5)
Oxymorphone: NEGATIVE ng/mL (ref <2.5)
Phencyclidine: NEGATIVE ng/mL (ref <10)
TRAMADOL: NEGATIVE ng/mL (ref <5.0)
Tapentadol: NEGATIVE ng/mL (ref <5.0)
Zolpidem: NEGATIVE ng/mL (ref <5.0)

## 2016-12-09 LAB — DRUG TOX ALC METAB W/CON, ORAL FLD: Alcohol Metabolite: NEGATIVE ng/mL (ref <25)

## 2016-12-11 ENCOUNTER — Other Ambulatory Visit (HOSPITAL_BASED_OUTPATIENT_CLINIC_OR_DEPARTMENT_OTHER): Payer: Medicare Other

## 2016-12-11 DIAGNOSIS — Z862 Personal history of diseases of the blood and blood-forming organs and certain disorders involving the immune mechanism: Secondary | ICD-10-CM

## 2016-12-11 LAB — COMPREHENSIVE METABOLIC PANEL
ALT: 55 U/L (ref 0–55)
AST: 62 U/L — AB (ref 5–34)
Albumin: 3.7 g/dL (ref 3.5–5.0)
Alkaline Phosphatase: 63 U/L (ref 40–150)
Anion Gap: 9 mEq/L (ref 3–11)
BUN: 13.1 mg/dL (ref 7.0–26.0)
CHLORIDE: 101 meq/L (ref 98–109)
CO2: 28 meq/L (ref 22–29)
Calcium: 9.6 mg/dL (ref 8.4–10.4)
Creatinine: 0.8 mg/dL (ref 0.7–1.3)
EGFR: >60 mL/min/{1.73_m2} (ref >60)
GLUCOSE: 128 mg/dL (ref 70–140)
POTASSIUM: 4.1 meq/L (ref 3.5–5.1)
SODIUM: 138 meq/L (ref 136–145)
Total Bilirubin: 1.37 mg/dL — ABNORMAL HIGH (ref 0.20–1.20)
Total Protein: 7.6 g/dL (ref 6.4–8.3)

## 2016-12-11 LAB — CBC WITH DIFFERENTIAL/PLATELET
BASO%: 0.8 % (ref 0.0–2.0)
BASOS ABS: 0 10*3/uL (ref 0.0–0.1)
EOS ABS: 0.1 10*3/uL (ref 0.0–0.5)
EOS%: 2.2 % (ref 0.0–7.0)
HCT: 40.9 % (ref 38.4–49.9)
HGB: 13.4 g/dL (ref 13.0–17.1)
LYMPH%: 25.2 % (ref 14.0–49.0)
MCH: 29.4 pg (ref 27.2–33.4)
MCHC: 32.8 g/dL (ref 32.0–36.0)
MCV: 89.5 fL (ref 79.3–98.0)
MONO#: 0.4 10*3/uL (ref 0.1–0.9)
MONO%: 10 % (ref 0.0–14.0)
NEUT%: 61.8 % (ref 39.0–75.0)
NEUTROS ABS: 2.6 10*3/uL (ref 1.5–6.5)
PLATELETS: 116 10*3/uL — AB (ref 140–400)
RBC: 4.58 10*6/uL (ref 4.20–5.82)
RDW: 14.3 % (ref 11.0–14.6)
WBC: 4.3 10*3/uL (ref 4.0–10.3)
lymph#: 1.1 10*3/uL (ref 0.9–3.3)

## 2016-12-11 LAB — FERRITIN: Ferritin: 100 ng/ml (ref 22–316)

## 2016-12-11 LAB — IRON AND TIBC
%SAT: 30 % (ref 20–55)
Iron: 106 ug/dL (ref 42–163)
TIBC: 356 ug/dL (ref 202–409)
UIBC: 250 ug/dL (ref 117–376)

## 2016-12-14 ENCOUNTER — Telehealth: Payer: Self-pay | Admitting: *Deleted

## 2016-12-14 NOTE — Telephone Encounter (Addendum)
Oral swab drug screen was consistent for prescribed morphine medications. He is prescribed hydrocodone and his last dose was reported 12/02/16.  The test was done on 12/03/16 so technically some metabolite of the medication should have been present.

## 2016-12-18 ENCOUNTER — Ambulatory Visit (HOSPITAL_BASED_OUTPATIENT_CLINIC_OR_DEPARTMENT_OTHER): Payer: Medicare Other

## 2016-12-18 ENCOUNTER — Telehealth: Payer: Self-pay | Admitting: Oncology

## 2016-12-18 ENCOUNTER — Ambulatory Visit (HOSPITAL_BASED_OUTPATIENT_CLINIC_OR_DEPARTMENT_OTHER): Payer: Medicare Other | Admitting: Oncology

## 2016-12-18 VITALS — BP 136/56 | HR 83 | Resp 18

## 2016-12-18 VITALS — BP 133/52 | HR 58 | Temp 98.1°F | Resp 18 | Ht 76.0 in | Wt 263.4 lb

## 2016-12-18 DIAGNOSIS — Z862 Personal history of diseases of the blood and blood-forming organs and certain disorders involving the immune mechanism: Secondary | ICD-10-CM

## 2016-12-18 DIAGNOSIS — D6959 Other secondary thrombocytopenia: Secondary | ICD-10-CM

## 2016-12-18 DIAGNOSIS — K7460 Unspecified cirrhosis of liver: Secondary | ICD-10-CM

## 2016-12-18 NOTE — Progress Notes (Signed)
Daniel Rivers presents today for phlebotomy per MD orders. Phlebotomy procedure started at 0940 and ended at 0945 via 16G to RAC. 510 ml removed. Patient tolerated procedure well.  Food and drink offered.  Pt stayed 25 minutes after procedure for observation. IV needle removed intact.

## 2016-12-18 NOTE — Patient Instructions (Signed)
Therapeutic Phlebotomy Therapeutic phlebotomy is the controlled removal of blood from a person's body for the purpose of treating a medical condition. The procedure is similar to donating blood. Usually, about a pint (470 mL, or 0.47L) of blood is removed. The average adult has 9-12 pints (4.3-5.7 L) of blood. Therapeutic phlebotomy may be used to treat the following medical conditions:  Hemochromatosis. This is a condition in which the blood contains too much iron.  Polycythemia vera. This is a condition in which the blood contains too many red blood cells.  Porphyria cutanea tarda. This is a disease in which an important part of hemoglobin is not made properly. It results in the buildup of abnormal amounts of porphyrins in the body.  Sickle cell disease. This is a condition in which the red blood cells form an abnormal crescent shape rather than a round shape.  Tell a health care provider about:  Any allergies you have.  All medicines you are taking, including vitamins, herbs, eye drops, creams, and over-the-counter medicines.  Any problems you or family members have had with anesthetic medicines.  Any blood disorders you have.  Any surgeries you have had.  Any medical conditions you have. What are the risks? Generally, this is a safe procedure. However, problems may occur, including:  Nausea or light-headedness.  Low blood pressure.  Soreness, bleeding, swelling, or bruising at the needle insertion site.  Infection.  What happens before the procedure?  Follow instructions from your health care provider about eating or drinking restrictions.  Ask your health care provider about changing or stopping your regular medicines. This is especially important if you are taking diabetes medicines or blood thinners.  Wear clothing with sleeves that can be raised above the elbow.  Plan to have someone take you home after the procedure.  You may have a blood sample taken. What  happens during the procedure?  A needle will be inserted into one of your veins.  Tubing and a collection bag will be attached to that needle.  Blood will flow through the needle and tubing into the collection bag.  You may be asked to open and close your hand slowly and continually during the entire collection.  After the specified amount of blood has been removed from your body, the collection bag and tubing will be clamped.  The needle will be removed from your vein.  Pressure will be held on the site of the needle insertion to stop the bleeding.  A bandage (dressing) will be placed over the needle insertion site. The procedure may vary among health care providers and hospitals. What happens after the procedure?  Your recovery will be assessed and monitored.  You can return to your normal activities as directed by your health care provider. This information is not intended to replace advice given to you by your health care provider. Make sure you discuss any questions you have with your health care provider. Document Released: 06/23/2010 Document Revised: 09/21/2015 Document Reviewed: 01/15/2014 Elsevier Interactive Patient Education  2018 [employer]vier Inc.  

## 2016-12-18 NOTE — Progress Notes (Signed)
Hematology and Oncology Follow Up Visit  Daniel Rivers [id number] 1950/02/28 66 y.o. 12/18/2016 8:47 AM Daniel Rivers, MDPang, Richard, MD   Principle Diagnosis: 66 year old with hemochromatosis and liver cirrhosis diagnosed in 2009. He established care at the [facility] in August of 2014. 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   Prior Therapy: He have been receiving intermittent phlebotomies in the past.  Current therapy: Phlebotomy to achieve ferritin level close to 50. This has been done every 3 months.  Interim History:  Daniel Rivers presents today for a followup visit. Since his last visit, he reports no recent complaints. He continues to receive phlebotomy every 3 months have tolerated this schedule well. He denied any chest pain, dyspnea on exertion or shortness of breath. His appetite slightly decreased lost 3 pounds. No major changes in his performance status or activity level. Continues to have chronic pain syndrome which is unchanged.  He denied any falls or syncope.  He has not reported any headaches or blurry vision or alteration of his mental status. Has not had any genitourinary complaints of frequency urgency or hesitancy. Has not reported any lymphadenopathy or petechiae. Has not reported any easy bruisability or any bleeding. Report any new skeletal complaints. The rest of his review of symptoms is unremarkable.   Medications: I have reviewed the patient's current medications.  Current Outpatient Medications  Medication Sig Dispense Refill  . ACCU-CHEK FASTCLIX LANCETS MISC     . aspirin 81 MG tablet Take 81 mg by mouth daily.     . bisacodyl (DULCOLAX) 5 MG EC tablet Take 5 mg by mouth at bedtime. 2 at bedtime    . buPROPion (WELLBUTRIN XL) 300 MG 24 hr tablet Take 1 tablet by mouth daily.    . carvedilol (COREG) 3.125 MG tablet Take 3.125 mg by mouth 2 (two) times daily.    . Cholecalciferol (VITAMIN D3) 2000 UNITS TABS  Take 1 tablet by mouth daily.    . diclofenac (VOLTAREN) 75 MG EC tablet Take 1 tablet (75 mg total) by mouth 2 (two) times daily with a meal. As needed 60 tablet 2  . furosemide (LASIX) 20 MG tablet Take 20 mg by mouth as needed for fluid.     [name] glimepiride (AMARYL) 2 MG tablet Take 1 mg by mouth daily before breakfast.     . HYDROcodone-acetaminophen (NORCO) 7.5-325 MG tablet Take 1 tablet by mouth every 6 (six) hours as needed (pain). 1 month supply 120 tablet 0  . KRILL OIL PO Take 1 tablet by mouth daily.     [name] losartan (COZAAR) 100 MG tablet Take 100 mg by mouth daily.    . Magnesium Oxide 400 MG CAPS Take 1 capsule by mouth daily.    . metFORMIN (GLUCOPHAGE) 1000 MG tablet Take 1,000 mg by mouth every 12 (twelve) hours.    [name] morphine (MS CONTIN) 60 MG 12 hr tablet Take 1 tablet (60 mg total) by mouth 3 (three) times daily. 90 tablet 0  . Multiple Vitamin (MULTIVITAMIN) capsule Take 1 capsule by mouth daily. NO IRON    . NITROSTAT 0.4 MG SL tablet Place 0.4 mg under the tongue every 5 (five) minutes as needed for chest pain.     . polyethylene glycol powder (GLYCOLAX/MIRALAX) powder Using measuring cap mix  17gm in water and drink two times daily 3162 g 11  . pravastatin (PRAVACHOL) 80 MG tablet Take 80 mg by mouth daily.    [name]  Psyllium (METAMUCIL PO) Take 17 g/day by mouth 2 (two) times daily.    [name] zolpidem (AMBIEN) 10 MG tablet Take 10 mg by mouth at bedtime as needed for sleep.      No current facility-administered medications for this visit.      Allergies:  Allergies  Allergen Reactions  . Amlodipine Besy-Benazepril Hcl Cough  . Naproxen Other (See Comments)    Worsens Tinnitus    Past Medical History, Surgical history, Social history, and Family History were reviewed and updated.  [name] Physical Exam: Blood pressure (!) 133/52, pulse (!) 58, temperature 98.1 F (36.7 C), temperature source Oral, resp. rate 18, height 6\' 4"  (1.93 m), weight 263 lb 6.4 oz (119.5 kg), SpO2 97  %. ECOG: 1 General appearance: Well-appearing gentleman without distress Head: Normocephalic, without obvious abnormality no oral thrush or ulcers. Neck: no adenopathy no thyroid masses. Lymph nodes: Cervical, supraclavicular, and axillary nodes normal. Heart:regular rate and rhythm, S1, S2 normal, no murmur, click, rub or gallop Lung:chest clear, no wheezing, rales, normal symmetric air entry. Abdomin: soft, non-tender, without masses or organomegaly no shifting dullness or ascites. EXT:no erythema, induration, or nodules   Lab Results: Lab Results  Component Value Date   WBC 4.3 12/11/2016   HGB 13.4 12/11/2016   HCT 40.9 12/11/2016   MCV 89.5 12/11/2016   PLT 116 (L) 12/11/2016     Chemistry      Component Value Date/Time   NA 138 12/11/2016 0931   K 4.1 12/11/2016 0931   CL 105 06/08/2014 0342   CO2 28 12/11/2016 0931   BUN 13.1 12/11/2016 0931   CREATININE 0.8 12/11/2016 0931      Component Value Date/Time   CALCIUM 9.6 12/11/2016 0931   ALKPHOS 63 12/11/2016 0931   AST 62 (H) 12/11/2016 0931   ALT 55 12/11/2016 0931   BILITOT 1.37 (H) 12/11/2016 0931       Results for Daniel Rivers, Daniel Rivers (MRN [id number]) as of 12/18/2016 08:37  Ref. Range 12/11/2016 09:31  Iron Latest Ref Range: 42 - 163 ug/dL 106  UIBC Latest Ref Range: 117 - 376 ug/dL 250  TIBC Latest Ref Range: 202 - 409 ug/dL 356  %SAT Latest Ref Range: 20 - 55 % 30  Ferritin Latest Ref Range: 22 - 316 ng/ml 100        Impression and Plan:  66 year old gentleman with the following issues:  1. Hemachromatosis: He has double heterozygous mutation with the risk of iron overload is reasonably well. He is currently on phlebotomy every 3 months to keep his ferritin close to 50.   Ferritin on 06/03/2016 was 108 with normal hemoglobin. Risks and benefits of continuing this procedure were reviewed today and is agreeable to continue with the same schedule.   We can consider changing his schedule to have more  frequent phlebotomy if needed to in the future.  I see no need to do this at this time.  2. Cirrhosis of the liver: He follows up with gastroenterology regarding that. His liver function tests are stable without any major changes. He does not exhibit any stigmata of worsening liver disease.  3. 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 Platelet count change from previous examination with.  4. Coronary artery disease: Status post catheterization and stenting. He is off Plavix at this time.  5. Follow-up: Will be in 3 months for possible repeat phlebotomy.   Zola Button, MD 11/16/20188:47 AM

## 2016-12-18 NOTE — Telephone Encounter (Signed)
Gave avs and calendar for February 2019 °

## 2016-12-31 DIAGNOSIS — H40013 Open angle with borderline findings, low risk, bilateral: Secondary | ICD-10-CM | POA: Diagnosis not present

## 2016-12-31 DIAGNOSIS — E119 Type 2 diabetes mellitus without complications: Secondary | ICD-10-CM | POA: Diagnosis not present

## 2016-12-31 DIAGNOSIS — H353111 Nonexudative age-related macular degeneration, right eye, early dry stage: Secondary | ICD-10-CM | POA: Diagnosis not present

## 2016-12-31 DIAGNOSIS — H2513 Age-related nuclear cataract, bilateral: Secondary | ICD-10-CM | POA: Diagnosis not present

## 2017-02-03 ENCOUNTER — Encounter: Payer: Self-pay | Admitting: Physical Medicine & Rehabilitation

## 2017-02-03 ENCOUNTER — Encounter: Payer: Medicare Other | Attending: Physical Medicine & Rehabilitation | Admitting: Physical Medicine & Rehabilitation

## 2017-02-03 VITALS — BP 123/79 | HR 81 | Resp 14

## 2017-02-03 DIAGNOSIS — I10 Essential (primary) hypertension: Secondary | ICD-10-CM | POA: Insufficient documentation

## 2017-02-03 DIAGNOSIS — K449 Diaphragmatic hernia without obstruction or gangrene: Secondary | ICD-10-CM | POA: Insufficient documentation

## 2017-02-03 DIAGNOSIS — M7522 Bicipital tendinitis, left shoulder: Secondary | ICD-10-CM | POA: Diagnosis not present

## 2017-02-03 DIAGNOSIS — N400 Enlarged prostate without lower urinary tract symptoms: Secondary | ICD-10-CM | POA: Insufficient documentation

## 2017-02-03 DIAGNOSIS — M455 Ankylosing spondylitis of thoracolumbar region: Secondary | ICD-10-CM | POA: Diagnosis not present

## 2017-02-03 DIAGNOSIS — E7800 Pure hypercholesterolemia, unspecified: Secondary | ICD-10-CM | POA: Diagnosis not present

## 2017-02-03 DIAGNOSIS — Z955 Presence of coronary angioplasty implant and graft: Secondary | ICD-10-CM | POA: Insufficient documentation

## 2017-02-03 DIAGNOSIS — I2510 Atherosclerotic heart disease of native coronary artery without angina pectoris: Secondary | ICD-10-CM | POA: Insufficient documentation

## 2017-02-03 DIAGNOSIS — M109 Gout, unspecified: Secondary | ICD-10-CM | POA: Diagnosis not present

## 2017-02-03 DIAGNOSIS — M1990 Unspecified osteoarthritis, unspecified site: Secondary | ICD-10-CM | POA: Insufficient documentation

## 2017-02-03 DIAGNOSIS — M459 Ankylosing spondylitis of unspecified sites in spine: Secondary | ICD-10-CM

## 2017-02-03 DIAGNOSIS — F329 Major depressive disorder, single episode, unspecified: Secondary | ICD-10-CM | POA: Diagnosis not present

## 2017-02-03 DIAGNOSIS — E669 Obesity, unspecified: Secondary | ICD-10-CM | POA: Diagnosis not present

## 2017-02-03 DIAGNOSIS — M545 Low back pain: Secondary | ICD-10-CM | POA: Diagnosis present

## 2017-02-03 DIAGNOSIS — G8929 Other chronic pain: Secondary | ICD-10-CM | POA: Diagnosis not present

## 2017-02-03 DIAGNOSIS — M456 Ankylosing spondylitis lumbar region: Secondary | ICD-10-CM | POA: Diagnosis not present

## 2017-02-03 DIAGNOSIS — E119 Type 2 diabetes mellitus without complications: Secondary | ICD-10-CM | POA: Diagnosis not present

## 2017-02-03 DIAGNOSIS — M7631 Iliotibial band syndrome, right leg: Secondary | ICD-10-CM

## 2017-02-03 DIAGNOSIS — M452 Ankylosing spondylitis of cervical region: Secondary | ICD-10-CM | POA: Insufficient documentation

## 2017-02-03 DIAGNOSIS — Z87891 Personal history of nicotine dependence: Secondary | ICD-10-CM | POA: Diagnosis not present

## 2017-02-03 DIAGNOSIS — K219 Gastro-esophageal reflux disease without esophagitis: Secondary | ICD-10-CM | POA: Diagnosis not present

## 2017-02-03 DIAGNOSIS — F419 Anxiety disorder, unspecified: Secondary | ICD-10-CM | POA: Diagnosis not present

## 2017-02-03 MED ORDER — MORPHINE SULFATE ER 60 MG PO TBCR: 60.0000 mg | EXTENDED_RELEASE_TABLET | Freq: Three times a day (TID) | ORAL | 0 refills | Status: DC

## 2017-02-03 MED ORDER — HYDROCODONE-ACETAMINOPHEN 7.5-325 MG PO TABS: 1.0000 | ORAL_TABLET | Freq: Four times a day (QID) | ORAL | 0 refills | Status: DC | PRN

## 2017-02-03 MED ORDER — HYDROCODONE-ACETAMINOPHEN 7.5-325 MG PO TABS
1.0000 | ORAL_TABLET | Freq: Four times a day (QID) | ORAL | 0 refills | Status: DC | PRN
Start: 2017-02-03 — End: 2017-04-02

## 2017-02-03 NOTE — Patient Instructions (Signed)
PLEASE FEEL FREE TO CALL OUR OFFICE WITH ANY PROBLEMS OR QUESTIONS ([phone])      

## 2017-02-03 NOTE — Progress Notes (Signed)
Subjective:    Patient ID: Daniel Rivers, male    DOB: 08-23-1950, 67 y.o.   MRN: [id number]  HPI   Mr. Schroader is here in follow up for his chronic pain.  For the most part Sesar has been doing quite well.  He stays active each and every day at home with chores at the house.  He also performs his stretches daily.  The right hip pain has been more manageable for the most part although he has his days where it feels tender.  Stretching has helped that.  He also likes heat.  He had an MRI done by his rheumatologist to follow-up his disease and there were no signs of inflammation at the right hip itself.  He remains on MS Contin 60 mg 3 times daily with hydrocodone 7.51 every 6 hours as needed for breakthrough pain.  This continues to be effective.  His bowel movements are regular.  Pain Inventory Average Pain 5 Pain Right Now 6 My pain is sharp, burning, dull, stabbing, tingling and aching  In the last 24 hours, has pain interfered with the following? General activity 5 Relation with others 4 Enjoyment of life 6 What TIME of day is your pain at its worst? daytime, evening Sleep (in general) Fair  Pain is worse with: walking, bending, sitting, inactivity, standing and some activites Pain improves with: rest, heat/ice, therapy/exercise, pacing activities and medication Relief from Meds: 4  Mobility walk with assistance use a cane how many minutes can you walk? 10-15 ability to climb steps?  yes do you drive?  yes Do you have any goals in this area?  no  Function Do you have any goals in this area?  no  Neuro/Psych weakness numbness trouble walking spasms dizziness  Prior Studies Any changes since last visit?  no  Physicians involved in your care Any changes since last visit?  no   History reviewed. No pertinent family history. Social History   Socioeconomic History  . Marital status: Widowed    Spouse name: None  . Number of children: None  . Years of  education: None  . Highest education level: None  Social Needs  . Financial resource strain: None  . Food insecurity - worry: None  . Food insecurity - inability: None  . Transportation needs - medical: None  . Transportation needs - non-medical: None  Occupational History  . None  Tobacco Use  . Smoking status: Former Smoker    Packs/day: 5.00    Years: 23.00    Pack years: 115.00    Types: Cigarettes    Last attempt to quit: 06/16/1986    Years since quitting: 30.6  . Smokeless tobacco: Never Used  Substance and Sexual Activity  . Alcohol use: Yes    Comment: 05/22/2014  "used to drink casually; never had problem w/it; none since ~ 2004"  . Drug use: No  . Sexual activity: No  Other Topics Concern  . None  Social History Narrative  . None   Past Surgical History:  Procedure Laterality Date  . ANAL FISSURE REPAIR    . ANKLE FRACTURE SURGERY Left   . BACK SURGERY    . BONE TISSUE     "I've had removal of excess tissue as a result of my ankylosing spondylitis"  . CARDIAC CATHETERIZATION N/A 06/07/2014   Procedure: Coronary Stent Intervention;  Surgeon: Adrian Prows, MD;  Location: [place] INVASIVE CV LAB CUPID;  Service: Cardiovascular;  Laterality: N/A;  . CARPAL TUNNEL  RELEASE Bilateral   . COLON SURGERY    . CORONARY ANGIOPLASTY WITH STENT PLACEMENT  2009; 05/22/2014   ?3; ?1  . cortisone injection    . FRACTURE SURGERY    . LEFT HEART CATHETERIZATION WITH CORONARY ANGIOGRAM N/A 05/22/2014   Procedure: LEFT HEART CATHETERIZATION WITH CORONARY ANGIOGRAM;  Surgeon: Adrian Prows, MD;  Location: [facility] CATH LAB;  Service: Cardiovascular;  Laterality: N/A;  . LUMBAR DISC SURGERY  X 2   "ruptured/herniated/growing together"  . PERCUTANEOUS CORONARY STENT INTERVENTION (PCI-S)  05/22/2014   Procedure: PERCUTANEOUS CORONARY STENT INTERVENTION (PCI-S);  Surgeon: Adrian Prows, MD;  Location: [facility] CATH LAB;  Service: Cardiovascular;;  mid LAD DES  . PERCUTANEOUS CORONARY STENT INTERVENTION (PCI-S)   06/07/2014   des to rca  . SHOULDER ARTHROSCOPY W/ ROTATOR CUFF REPAIR Bilateral   . SMALL INTESTINE SURGERY  1960   "took out 14 inches"  . ULNAR TUNNEL RELEASE Left    Past Medical History:  Diagnosis Date  . Ankylosing spondylitis of cervical region ([place])   . Ankylosing spondylitis of lumbar region ([place])   . Anxiety   . BPH (benign prostatic hypertrophy)   . Bursitis, trochanteric   . Chronic back pain   . Chronic cervical pain   . Coronary artery disease   . Daily headache    "recently; related to RX for my heart" (05/21/2014)  . Depression   . GERD (gastroesophageal reflux disease)   . Hemochromatosis   . High cholesterol   . History of hiatal hernia   . Hypertension   . Non-alcoholic cirrhosis ([place])    "from the hemochromatosis"  . Obesity   . Osteoarthritis    "joints" (05/20/2104)  . Rotator cuff syndrome   . Tendonitis   . Type II diabetes mellitus ([place])    There were no vitals taken for this visit.  Opioid Risk Score:   Fall Risk Score:  `1  Depression screen PHQ 2/9  Depression screen [facility] 2/9 12/03/2016 10/07/2016 06/04/2016 04/03/2016 07/22/2015 04/22/2015 05/08/2014  Decreased Interest 0 0 0 0 0 2 2  Down, Depressed, Hopeless 0 0 0 0 0 0 0  PHQ - 2 Score 0 0 0 0 0 2 2  Altered sleeping - - - - - - 0  Tired, decreased energy - - - - - - 3  Change in appetite - - - - - - 0  Feeling bad or failure about yourself  - - - - - - 1  Trouble concentrating - - - - - - 1  Moving slowly or fidgety/restless - - - - - - 0  Suicidal thoughts - - - - - - 0  PHQ-9 Score - - - - - - 7    Review of Systems  HENT: Negative.   Eyes: Negative.   Respiratory: Negative.   Cardiovascular: Negative.   Endocrine: Negative.   Genitourinary: Negative.   Musculoskeletal: Positive for arthralgias, back pain, gait problem, neck pain and neck stiffness.       Spasms  Skin: Negative.   Allergic/Immunologic: Negative.   Neurological: Positive for dizziness, weakness and numbness.        Tingling  All other systems reviewed and are negative.      Objective:   Physical Exam Constitutional: He is oriented to person, place, and time. He appears well-developedand well-nourished.  HENT:  Head: Normocephalicand atraumatic.  Neck: Normal range of motion. Neck supple.  Cervical Paraspinal Tenderness: C-5-C-6--stable Cardiovascular:. RRR  Pulmonary/Chest: CTA Bilaterally without  wheezes or rales. Normal effort     Musculoskeletal: right TFL a little tight, cross leg maneuver still generates some pain.  Normal Muscle Bulk and Muscle Testing Reveals: Thoracic Paraspinal Tenderness: T-1-T-3 Mainly Right Side--stable Lumbar Paraspinal Tenderness: L-3-L-5 Lower Extremities: Full ROM    Neurological: He is alertand oriented to person, place, and time. Motor exam normal Skin: Skin is warmand dry.  Psychiatric: He has a normal mood and affect.  Nursing noteand vitalsreviewed.       Assessment & Plan:  1. Ankylosing spondylitis of the cervical and lumbar spine: 06/04/2016 Refilled: Hydrocodone 7.5/325mg  one tablet every 6 hours as needed #120 and MS Contin 60 mg one tablet three times a day #90. We will continue the opioid monitoring program, this consists of regular clinic visits, examinations, routine drug screening, pill counts as well as use of [place] Controlled Substance Reporting System. [place] today.   2. 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   3. Cervicalgia/ Cervical Radiculitis: appears controlled at present. Don't think right hip pain is radicular 4. Right TFL strain/overuse injury---generally improved             -continue with stretches              -ice/heat             -voltaren prn               15 minutes of face to face patient care time was spent during this visit. All questions were encouraged and answered. Greater than 50% of time during this encounter was spent counseling patient/family in regard to  hip stretches and HEP.      F/U in 2 months

## 2017-03-22 ENCOUNTER — Inpatient Hospital Stay: Payer: Medicare Other | Attending: Oncology

## 2017-03-22 DIAGNOSIS — D696 Thrombocytopenia, unspecified: Secondary | ICD-10-CM | POA: Insufficient documentation

## 2017-03-22 DIAGNOSIS — Z79899 Other long term (current) drug therapy: Secondary | ICD-10-CM | POA: Diagnosis not present

## 2017-03-22 DIAGNOSIS — K7460 Unspecified cirrhosis of liver: Secondary | ICD-10-CM | POA: Diagnosis not present

## 2017-03-22 DIAGNOSIS — Z862 Personal history of diseases of the blood and blood-forming organs and certain disorders involving the immune mechanism: Secondary | ICD-10-CM

## 2017-03-22 LAB — CBC WITH DIFFERENTIAL/PLATELET
BASOS PCT: 0 %
Basophils Absolute: 0 10*3/uL (ref 0.0–0.1)
EOS ABS: 0.1 10*3/uL (ref 0.0–0.5)
Eosinophils Relative: 2 %
HCT: 43.1 % (ref 38.4–49.9)
Hemoglobin: 14.3 g/dL (ref 13.0–17.1)
LYMPHS ABS: 1.4 10*3/uL (ref 0.9–3.3)
Lymphocytes Relative: 30 %
MCH: 30.1 pg (ref 27.2–33.4)
MCHC: 33.2 g/dL (ref 32.0–36.0)
MCV: 90.7 fL (ref 79.3–98.0)
MONO ABS: 0.4 10*3/uL (ref 0.1–0.9)
MONOS PCT: 10 %
Neutro Abs: 2.7 10*3/uL (ref 1.5–6.5)
Neutrophils Relative %: 58 %
Platelets: 123 10*3/uL — ABNORMAL LOW (ref 140–400)
RBC: 4.75 MIL/uL (ref 4.20–5.82)
RDW: 13.3 % (ref 11.0–14.6)
WBC: 4.6 10*3/uL (ref 4.0–10.3)

## 2017-03-22 LAB — IRON AND TIBC
IRON: 103 ug/dL (ref 42–163)
SATURATION RATIOS: 29 % — AB (ref 42–163)
TIBC: 360 ug/dL (ref 202–409)
UIBC: 257 ug/dL

## 2017-03-22 LAB — FERRITIN: Ferritin: 105 ng/mL (ref 22–316)

## 2017-03-25 ENCOUNTER — Inpatient Hospital Stay (HOSPITAL_BASED_OUTPATIENT_CLINIC_OR_DEPARTMENT_OTHER): Payer: Medicare Other | Admitting: Oncology

## 2017-03-25 ENCOUNTER — Ambulatory Visit: Payer: Medicare Other

## 2017-03-25 ENCOUNTER — Telehealth: Payer: Self-pay | Admitting: Oncology

## 2017-03-25 VITALS — BP 137/63 | HR 93 | Temp 98.4°F | Resp 18 | Ht 76.0 in | Wt 264.6 lb

## 2017-03-25 VITALS — BP 136/71 | HR 86 | Temp 98.0°F | Resp 20

## 2017-03-25 DIAGNOSIS — K7460 Unspecified cirrhosis of liver: Secondary | ICD-10-CM

## 2017-03-25 DIAGNOSIS — D696 Thrombocytopenia, unspecified: Secondary | ICD-10-CM

## 2017-03-25 DIAGNOSIS — Z79899 Other long term (current) drug therapy: Secondary | ICD-10-CM | POA: Diagnosis not present

## 2017-03-25 DIAGNOSIS — Z862 Personal history of diseases of the blood and blood-forming organs and certain disorders involving the immune mechanism: Secondary | ICD-10-CM

## 2017-03-25 NOTE — Progress Notes (Signed)
Hematology and Oncology Follow Up Visit  Daniel Rivers [id number] 20-Dec-1950 67 y.o. 03/25/2017 10:06 AM [name], MDKim, Jeneen Rinks, MD   Principle Diagnosis: 67 year old with hemochromatosis and liver cirrhosis diagnosed in 2009.  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   Prior Therapy: He have been receiving intermittent phlebotomies in the past.  Current therapy: Phlebotomy every 3 months to achieve ferritin level close to 50.   Interim History:  Daniel Rivers is here for a follow-up.  He reports no major changes in his health since last visit.  He continues to receive phlebotomy every 3 months have tolerated this schedule well. He denied any chest pain, dyspnea on exertion or shortness of breath.  He continues to attend activities of daily living without any reasonable decline in his ability to do so.  He denied any recent thrombosis or bleeding episodes.  He denies any exacerbations of his pain.  He has not reported any headaches or blurry vision or alteration of his mental status.  He does not report any fevers or chills or sweats.  He does not report any chest pain, palpitation or orthopnea.  He does not report any cough or wheezing.  Has not had any genitourinary complaints of frequency urgency or hesitancy. Has not reported any lymphadenopathy or petechiae. Has not reported any easy bruisability or any bleeding.  He does not report any new skeletal complaints. The rest of his review of symptoms is negative.  Medications: I have reviewed the patient's current medications.  Current Outpatient Medications  Medication Sig Dispense Refill  . ACCU-CHEK FASTCLIX LANCETS MISC     . aspirin 81 MG tablet Take 81 mg by mouth daily.     . bisacodyl (DULCOLAX) 5 MG EC tablet Take 5 mg by mouth at bedtime. 2 at bedtime    . buPROPion (WELLBUTRIN XL) 300 MG 24 hr tablet Take 1 tablet by mouth daily.    . carvedilol (COREG) 3.125 MG tablet Take 3.125 mg by mouth 2  (two) times daily.    . Cholecalciferol (VITAMIN D3) 2000 UNITS TABS Take 1 tablet by mouth daily.    . diclofenac (VOLTAREN) 75 MG EC tablet Take 1 tablet (75 mg total) by mouth 2 (two) times daily with a meal. As needed 60 tablet 2  . furosemide (LASIX) 20 MG tablet Take 20 mg by mouth as needed for fluid.     [name] glimepiride (AMARYL) 2 MG tablet Take 1 mg by mouth daily before breakfast.     . HYDROcodone-acetaminophen (NORCO) 7.5-325 MG tablet Take 1 tablet by mouth every 6 (six) hours as needed (pain). 1 month supply 120 tablet 0  . KRILL OIL PO Take 1 tablet by mouth daily.     [name] losartan (COZAAR) 100 MG tablet Take 100 mg by mouth daily.    . Magnesium Oxide 400 MG CAPS Take 1 capsule by mouth daily.    . metFORMIN (GLUCOPHAGE) 1000 MG tablet Take 1,000 mg by mouth every 12 (twelve) hours.    [name] morphine (MS CONTIN) 60 MG 12 hr tablet Take 1 tablet (60 mg total) by mouth 3 (three) times daily. 90 tablet 0  . Multiple Vitamin (MULTIVITAMIN) capsule Take 1 capsule by mouth daily. NO IRON    . NITROSTAT 0.4 MG SL tablet Place 0.4 mg under the tongue every 5 (five) minutes as needed for chest pain.     . polyethylene glycol powder (GLYCOLAX/MIRALAX) powder Using measuring cap mix  17gm in  water and drink two times daily 3162 g 11  . pravastatin (PRAVACHOL) 80 MG tablet Take 80 mg by mouth daily.    . Psyllium (METAMUCIL PO) Take 17 g/day by mouth 2 (two) times daily.    [name] zolpidem (AMBIEN) 10 MG tablet Take 10 mg by mouth at bedtime as needed for sleep.      No current facility-administered medications for this visit.      Allergies:  Allergies  Allergen Reactions  . Amlodipine Besy-Benazepril Hcl Cough  . Naproxen Other (See Comments)    Worsens Tinnitus    Past Medical History, Surgical history, Social history, and Family History were reviewed and updated.  [name] Physical Exam: Blood pressure 137/63, pulse 93, temperature 98.4 F (36.9 C), temperature source Oral, resp. rate 18,  height 6\' 4"  (1.93 m), weight 264 lb 9.6 oz (120 kg), SpO2 98 %. ECOG: 1 General appearance: Alert, awake gentleman without distress. Head: Normocephalic, without obvious abnormality  Oropharynx: No oral thrush or ulcers. Eyes: No scleral icterus. Lymph nodes: Cervical, supraclavicular, and axillary nodes normal. Heart:regular rate and rhythm, S1, S2 normal, no murmur, click, rub or gallop Lung:chest clear, no wheezing, rales, normal symmetric air entry. Abdomin: soft, non-tender, without masses or organomegaly  Musculoskeletal: No joint deformity or effusion.   Lab Results: Lab Results  Component Value Date   WBC 4.6 03/22/2017   HGB 14.3 03/22/2017   HCT 43.1 03/22/2017   MCV 90.7 03/22/2017   PLT 123 (L) 03/22/2017     Chemistry      Component Value Date/Time   NA 138 12/11/2016 0931   K 4.1 12/11/2016 0931   CL 105 06/08/2014 0342   CO2 28 12/11/2016 0931   BUN 13.1 12/11/2016 0931   CREATININE 0.8 12/11/2016 0931      Component Value Date/Time   CALCIUM 9.6 12/11/2016 0931   ALKPHOS 63 12/11/2016 0931   AST 62 (H) 12/11/2016 0931   ALT 55 12/11/2016 0931   BILITOT 1.37 (H) 12/11/2016 0931     Results for Daniel Rivers, Daniel Rivers (MRN [id number]) as of 03/25/2017 09:59  Ref. Range 12/11/2016 09:31 12/11/2016 09:31 03/22/2017 10:57  Iron Latest Ref Range: 42 - 163 ug/dL 106  103  UIBC Latest Units: ug/dL 250  257  TIBC Latest Ref Range: 202 - 409 ug/dL 356  360  %SAT Latest Ref Range: 20 - 55 % 30    Saturation Ratios Latest Ref Range: 42 - 163 %   29 (L)  Ferritin Latest Ref Range: 22 - 316 ng/mL 100  105      Impression and Plan:  67 year old gentleman with the following issues:  1. Hemachromatosis diagnosed in 2009: He has double heterozygous mutation with history of iron overload.  He is currently receiving phlebotomy every 3 months for liver protective purposes.  Laboratory data obtained in February 2018 were reviewed and showed ferritin level to be 105 and he  will proceed with phlebotomy today.  Risks and benefits of continuing this approach was discussed today.  Complications of long-term phlebotomy was reviewed is agreeable to continue.  2. Cirrhosis of the liver: No deterioration or decompensation noted.  Liver function tests appeared stable.  3. Thrombocytopenia: No major changes in his platelet count. No active bleeding.  4. Follow-up: Will be in 3 months for possible repeat phlebotomy.  15  minutes was spent with the patient face-to-face today.  More than 50% of time was dedicated to patient counseling, education and coordination of the patient's multifaceted  care.   Zola Button, MD 2/21/201910:06 AM

## 2017-03-25 NOTE — Telephone Encounter (Signed)
Appointment scheduled AVS/Calendar printed per 2/21 los °

## 2017-03-25 NOTE — Patient Instructions (Signed)

## 2017-04-02 ENCOUNTER — Encounter: Payer: Medicare Other | Attending: Physical Medicine & Rehabilitation | Admitting: Registered Nurse

## 2017-04-02 ENCOUNTER — Encounter: Payer: Self-pay | Admitting: Registered Nurse

## 2017-04-02 VITALS — BP 142/82 | HR 90

## 2017-04-02 DIAGNOSIS — M7062 Trochanteric bursitis, left hip: Secondary | ICD-10-CM

## 2017-04-02 DIAGNOSIS — M455 Ankylosing spondylitis of thoracolumbar region: Secondary | ICD-10-CM

## 2017-04-02 DIAGNOSIS — G894 Chronic pain syndrome: Secondary | ICD-10-CM

## 2017-04-02 DIAGNOSIS — K219 Gastro-esophageal reflux disease without esophagitis: Secondary | ICD-10-CM | POA: Diagnosis not present

## 2017-04-02 DIAGNOSIS — Z79899 Other long term (current) drug therapy: Secondary | ICD-10-CM

## 2017-04-02 DIAGNOSIS — M452 Ankylosing spondylitis of cervical region: Secondary | ICD-10-CM | POA: Insufficient documentation

## 2017-04-02 DIAGNOSIS — K449 Diaphragmatic hernia without obstruction or gangrene: Secondary | ICD-10-CM | POA: Insufficient documentation

## 2017-04-02 DIAGNOSIS — N400 Enlarged prostate without lower urinary tract symptoms: Secondary | ICD-10-CM | POA: Diagnosis not present

## 2017-04-02 DIAGNOSIS — M5412 Radiculopathy, cervical region: Secondary | ICD-10-CM | POA: Diagnosis not present

## 2017-04-02 DIAGNOSIS — M67911 Unspecified disorder of synovium and tendon, right shoulder: Secondary | ICD-10-CM | POA: Diagnosis not present

## 2017-04-02 DIAGNOSIS — M7061 Trochanteric bursitis, right hip: Secondary | ICD-10-CM | POA: Diagnosis not present

## 2017-04-02 DIAGNOSIS — M7522 Bicipital tendinitis, left shoulder: Secondary | ICD-10-CM | POA: Diagnosis not present

## 2017-04-02 DIAGNOSIS — G8929 Other chronic pain: Secondary | ICD-10-CM

## 2017-04-02 DIAGNOSIS — Z87891 Personal history of nicotine dependence: Secondary | ICD-10-CM | POA: Insufficient documentation

## 2017-04-02 DIAGNOSIS — M1990 Unspecified osteoarthritis, unspecified site: Secondary | ICD-10-CM | POA: Insufficient documentation

## 2017-04-02 DIAGNOSIS — Z955 Presence of coronary angioplasty implant and graft: Secondary | ICD-10-CM | POA: Insufficient documentation

## 2017-04-02 DIAGNOSIS — F329 Major depressive disorder, single episode, unspecified: Secondary | ICD-10-CM | POA: Diagnosis not present

## 2017-04-02 DIAGNOSIS — F419 Anxiety disorder, unspecified: Secondary | ICD-10-CM | POA: Diagnosis not present

## 2017-04-02 DIAGNOSIS — E119 Type 2 diabetes mellitus without complications: Secondary | ICD-10-CM | POA: Insufficient documentation

## 2017-04-02 DIAGNOSIS — I2510 Atherosclerotic heart disease of native coronary artery without angina pectoris: Secondary | ICD-10-CM | POA: Insufficient documentation

## 2017-04-02 DIAGNOSIS — M545 Low back pain: Secondary | ICD-10-CM

## 2017-04-02 DIAGNOSIS — M67912 Unspecified disorder of synovium and tendon, left shoulder: Secondary | ICD-10-CM

## 2017-04-02 DIAGNOSIS — M456 Ankylosing spondylitis lumbar region: Secondary | ICD-10-CM | POA: Diagnosis not present

## 2017-04-02 DIAGNOSIS — M459 Ankylosing spondylitis of unspecified sites in spine: Secondary | ICD-10-CM

## 2017-04-02 DIAGNOSIS — Z5181 Encounter for therapeutic drug level monitoring: Secondary | ICD-10-CM

## 2017-04-02 DIAGNOSIS — E669 Obesity, unspecified: Secondary | ICD-10-CM | POA: Insufficient documentation

## 2017-04-02 DIAGNOSIS — M109 Gout, unspecified: Secondary | ICD-10-CM | POA: Insufficient documentation

## 2017-04-02 DIAGNOSIS — M542 Cervicalgia: Secondary | ICD-10-CM | POA: Diagnosis not present

## 2017-04-02 DIAGNOSIS — I10 Essential (primary) hypertension: Secondary | ICD-10-CM | POA: Insufficient documentation

## 2017-04-02 DIAGNOSIS — E7800 Pure hypercholesterolemia, unspecified: Secondary | ICD-10-CM | POA: Diagnosis not present

## 2017-04-02 MED ORDER — HYDROCODONE-ACETAMINOPHEN 7.5-325 MG PO TABS: 1.0000 | ORAL_TABLET | Freq: Four times a day (QID) | ORAL | 0 refills | Status: DC | PRN

## 2017-04-02 MED ORDER — MORPHINE SULFATE ER 60 MG PO TBCR: 60.0000 mg | EXTENDED_RELEASE_TABLET | Freq: Three times a day (TID) | ORAL | 0 refills | Status: DC

## 2017-04-02 NOTE — Progress Notes (Signed)
Subjective:    Patient ID: Daniel Rivers, male    DOB: 1950-03-16, 67 y.o.   MRN: [id number]  HPI: Daniel Rivers is a 67year old male who returns for chronic pain and medication refill. He states his pain is located in his neck radiating into his bilateral  shoulders L>R, lower back and bilateral hips. He rates his pain 6. His current exercise regime is walking.   Mr. Groot Morphine equivalent is 203.00 MME.     Oral Swab was performed on 12/03/2016 it was consistent for Morphine, see note for details. UDS ordered today.  Pain Inventory Average Pain 5 Pain Right Now 6 My pain is sharp, burning, dull, stabbing, tingling and aching  In the last 24 hours, has pain interfered with the following? General activity 5 Relation with others 6 Enjoyment of life 5 What TIME of day is your pain at its worst? daytime and evening Sleep (in general) Fair  Pain is worse with: walking, bending, sitting, inactivity, standing and unsure Pain improves with: rest, heat/ice, therapy/exercise, pacing activities and medication Relief from Meds: 5  Mobility use a cane how many minutes can you walk? 10-15 ability to climb steps?  yes do you drive?  yes  Function Do you have any goals in this area?  no  Neuro/Psych weakness numbness tingling trouble walking confusion  Prior Studies Any changes since last visit?  no  Physicians involved in your care Any changes since last visit?  no   No family history on file. Social History   Socioeconomic History  . Marital status: Widowed    Spouse name: Not on file  . Number of children: Not on file  . Years of education: Not on file  . Highest education level: Not on file  Social Needs  . Financial resource strain: Not on file  . Food insecurity - worry: Not on file  . Food insecurity - inability: Not on file  . Transportation needs - medical: Not on file  . Transportation needs - non-medical: Not on file  Occupational History    . Not on file  Tobacco Use  . Smoking status: Former Smoker    Packs/day: 5.00    Years: 23.00    Pack years: 115.00    Types: Cigarettes    Last attempt to quit: 06/16/1986    Years since quitting: 30.8  . Smokeless tobacco: Never Used  Substance and Sexual Activity  . Alcohol use: Yes    Comment: 05/22/2014  "used to drink casually; never had problem w/it; none since ~ 2004"  . Drug use: No  . Sexual activity: No  Other Topics Concern  . Not on file  Social History Narrative  . Not on file   Past Surgical History:  Procedure Laterality Date  . ANAL FISSURE REPAIR    . ANKLE FRACTURE SURGERY Left   . BACK SURGERY    . BONE TISSUE     "I've had removal of excess tissue as a result of my ankylosing spondylitis"  . CARDIAC CATHETERIZATION N/A 06/07/2014   Procedure: Coronary Stent Intervention;  Surgeon: Adrian Prows, MD;  Location: [facility] CV LAB CUPID;  Service: Cardiovascular;  Laterality: N/A;  . CARPAL TUNNEL RELEASE Bilateral   . COLON SURGERY    . CORONARY ANGIOPLASTY WITH STENT PLACEMENT  2009; 05/22/2014   ?3; ?1  . cortisone injection    . FRACTURE SURGERY    . LEFT HEART CATHETERIZATION WITH CORONARY ANGIOGRAM N/A 05/22/2014  Procedure: LEFT HEART CATHETERIZATION WITH CORONARY ANGIOGRAM;  Surgeon: Adrian Prows, MD;  Location: [facility] CATH LAB;  Service: Cardiovascular;  Laterality: N/A;  . LUMBAR DISC SURGERY  X 2   "ruptured/herniated/growing together"  . PERCUTANEOUS CORONARY STENT INTERVENTION (PCI-S)  05/22/2014   Procedure: PERCUTANEOUS CORONARY STENT INTERVENTION (PCI-S);  Surgeon: Adrian Prows, MD;  Location: [facility] CATH LAB;  Service: Cardiovascular;;  mid LAD DES  . PERCUTANEOUS CORONARY STENT INTERVENTION (PCI-S)  06/07/2014   des to rca  . SHOULDER ARTHROSCOPY W/ ROTATOR CUFF REPAIR Bilateral   . SMALL INTESTINE SURGERY  1960   "took out 14 inches"  . ULNAR TUNNEL RELEASE Left    Past Medical History:  Diagnosis Date  . Ankylosing spondylitis of cervical region  ([place])   . Ankylosing spondylitis of lumbar region ([place])   . Anxiety   . BPH (benign prostatic hypertrophy)   . Bursitis, trochanteric   . Chronic back pain   . Chronic cervical pain   . Coronary artery disease   . Daily headache    "recently; related to RX for my heart" (05/21/2014)  . Depression   . GERD (gastroesophageal reflux disease)   . Hemochromatosis   . High cholesterol   . History of hiatal hernia   . Hypertension   . Non-alcoholic cirrhosis ([place])    "from the hemochromatosis"  . Obesity   . Osteoarthritis    "joints" (05/20/2104)  . Rotator cuff syndrome   . Tendonitis   . Type II diabetes mellitus ([place])    There were no vitals taken for this visit.  Opioid Risk Score:  0 Fall Risk Score:  `1  Depression screen PHQ 2/9  Depression screen [facility] 2/9 12/03/2016 10/07/2016 06/04/2016 04/03/2016 07/22/2015 04/22/2015 05/08/2014  Decreased Interest 0 0 0 0 0 2 2  Down, Depressed, Hopeless 0 0 0 0 0 0 0  PHQ - 2 Score 0 0 0 0 0 2 2  Altered sleeping - - - - - - 0  Tired, decreased energy - - - - - - 3  Change in appetite - - - - - - 0  Feeling bad or failure about yourself  - - - - - - 1  Trouble concentrating - - - - - - 1  Moving slowly or fidgety/restless - - - - - - 0  Suicidal thoughts - - - - - - 0  PHQ-9 Score - - - - - - 7    Review of Systems  HENT: Negative.   Eyes: Negative.   Respiratory: Negative.   Cardiovascular: Negative.   Gastrointestinal: Negative.   Endocrine: Negative.   Genitourinary: Negative.   Musculoskeletal: Positive for arthralgias, back pain and myalgias.  Skin: Negative.   Neurological: Positive for weakness and numbness.       Tingling  Hematological: Negative.   Psychiatric/Behavioral: Positive for confusion.  All other systems reviewed and are negative.      Objective:   Physical Exam  Constitutional: He is oriented to person, place, and time. He appears well-developed and well-nourished.  HENT:  Head: Normocephalic and  atraumatic.  Neck: Normal range of motion. Neck supple.  Cervical Paraspinal Tenderness: C-5-C-6  Cardiovascular: Normal rate and regular rhythm.  Pulmonary/Chest: Effort normal and breath sounds normal.  Musculoskeletal:  Normal Muscle Bulk and Muscle Testing Reveals: Upper Extremities: Full ROM and Muscle Strength 5/5 Thoracic Paraspinal Tenderness: T-1-T-3 Lumbar Paraspinal Tenderness: L-3-L-5 Lower Extremities: Full ROM and Muscle Strength 5/5 Arises from chair with ease Narrow  Based Gait   Neurological: He is alert and oriented to person, place, and time.  Skin: Skin is warm and dry.  Psychiatric: He has a normal mood and affect.  Nursing note and vitals reviewed.         Assessment & Plan:  1. Ankylosing spondylitis of the cervical and lumbar spine: 04/02/2017 Refilled: Hydrocodone 7.5/325mg  one tablet every 6 hours as needed #120 and MS Contin 60 mg one tablet three times a day #90. Second script given for the following month. 04/02/2017 2. Disorder of rotator cuff of both shoulders: Continue HEP as tolerated and Medication Regimen. 04/02/2017 3. Cervicalgia/ Cervical Radiculitis: Refuses Gabapentin. Continue to Monitor. 04/02/2017. 4. Bilateral Greater Trochanteric Bursitis: Continue Ice and Heat Therapy. 04/02/2017.  20 minutes of face to face patient care time was spent during this visit. All questions were encouraged and answered.  F/U in 2 months

## 2017-04-05 DIAGNOSIS — Z79899 Other long term (current) drug therapy: Secondary | ICD-10-CM | POA: Diagnosis not present

## 2017-04-05 DIAGNOSIS — Z5181 Encounter for therapeutic drug level monitoring: Secondary | ICD-10-CM | POA: Diagnosis not present

## 2017-04-05 DIAGNOSIS — E1165 Type 2 diabetes mellitus with hyperglycemia: Secondary | ICD-10-CM | POA: Diagnosis not present

## 2017-04-05 DIAGNOSIS — I10 Essential (primary) hypertension: Secondary | ICD-10-CM | POA: Diagnosis not present

## 2017-04-09 ENCOUNTER — Telehealth: Payer: Self-pay | Admitting: *Deleted

## 2017-04-09 LAB — 6-ACETYLMORPHINE,TOXASSURE ADD
6-ACETYLMORPHINE: NEGATIVE
6-acetylmorphine: NOT DETECTED ng/mg creat

## 2017-04-09 LAB — TOXASSURE SELECT,+ANTIDEPR,UR

## 2017-04-09 NOTE — Telephone Encounter (Signed)
Urine drug screen for this encounter is consistent for prescribed medication. There is some question about the absence of hydrocodone which he is also taking.  There is hydromorphone present which is a metabolite of both hydrocodone and morphine.  I spoke with [facility] toxicologist and they feel the hydrocodone probably metabolized below the cutoff level leaving only hydromorphone metabolite.  He has had it present on some past UDS and not in others but always hydromorphone. His last dose was taken the night before the test, there fore I feel this is a consistent result.

## 2017-04-12 DIAGNOSIS — R300 Dysuria: Secondary | ICD-10-CM | POA: Diagnosis not present

## 2017-04-12 DIAGNOSIS — N390 Urinary tract infection, site not specified: Secondary | ICD-10-CM | POA: Diagnosis not present

## 2017-04-12 DIAGNOSIS — I10 Essential (primary) hypertension: Secondary | ICD-10-CM | POA: Diagnosis not present

## 2017-04-12 DIAGNOSIS — E1165 Type 2 diabetes mellitus with hyperglycemia: Secondary | ICD-10-CM | POA: Diagnosis not present

## 2017-04-12 DIAGNOSIS — E7800 Pure hypercholesterolemia, unspecified: Secondary | ICD-10-CM | POA: Diagnosis not present

## 2017-06-07 ENCOUNTER — Encounter: Payer: Medicare Other | Attending: Physical Medicine & Rehabilitation | Admitting: Registered Nurse

## 2017-06-07 ENCOUNTER — Other Ambulatory Visit: Payer: Self-pay

## 2017-06-07 VITALS — BP 117/72 | HR 86 | Ht 75.5 in | Wt 260.8 lb

## 2017-06-07 DIAGNOSIS — E669 Obesity, unspecified: Secondary | ICD-10-CM | POA: Insufficient documentation

## 2017-06-07 DIAGNOSIS — M7062 Trochanteric bursitis, left hip: Secondary | ICD-10-CM | POA: Diagnosis not present

## 2017-06-07 DIAGNOSIS — K449 Diaphragmatic hernia without obstruction or gangrene: Secondary | ICD-10-CM | POA: Diagnosis not present

## 2017-06-07 DIAGNOSIS — I2510 Atherosclerotic heart disease of native coronary artery without angina pectoris: Secondary | ICD-10-CM | POA: Insufficient documentation

## 2017-06-07 DIAGNOSIS — M545 Low back pain: Secondary | ICD-10-CM | POA: Diagnosis present

## 2017-06-07 DIAGNOSIS — M459 Ankylosing spondylitis of unspecified sites in spine: Secondary | ICD-10-CM | POA: Diagnosis not present

## 2017-06-07 DIAGNOSIS — N400 Enlarged prostate without lower urinary tract symptoms: Secondary | ICD-10-CM | POA: Insufficient documentation

## 2017-06-07 DIAGNOSIS — M109 Gout, unspecified: Secondary | ICD-10-CM | POA: Diagnosis not present

## 2017-06-07 DIAGNOSIS — I10 Essential (primary) hypertension: Secondary | ICD-10-CM | POA: Diagnosis not present

## 2017-06-07 DIAGNOSIS — M456 Ankylosing spondylitis lumbar region: Secondary | ICD-10-CM | POA: Insufficient documentation

## 2017-06-07 DIAGNOSIS — M7522 Bicipital tendinitis, left shoulder: Secondary | ICD-10-CM | POA: Insufficient documentation

## 2017-06-07 DIAGNOSIS — M1990 Unspecified osteoarthritis, unspecified site: Secondary | ICD-10-CM | POA: Diagnosis not present

## 2017-06-07 DIAGNOSIS — Z955 Presence of coronary angioplasty implant and graft: Secondary | ICD-10-CM | POA: Diagnosis not present

## 2017-06-07 DIAGNOSIS — M452 Ankylosing spondylitis of cervical region: Secondary | ICD-10-CM | POA: Diagnosis not present

## 2017-06-07 DIAGNOSIS — E7800 Pure hypercholesterolemia, unspecified: Secondary | ICD-10-CM | POA: Diagnosis not present

## 2017-06-07 DIAGNOSIS — Z5181 Encounter for therapeutic drug level monitoring: Secondary | ICD-10-CM | POA: Diagnosis not present

## 2017-06-07 DIAGNOSIS — G894 Chronic pain syndrome: Secondary | ICD-10-CM

## 2017-06-07 DIAGNOSIS — M5412 Radiculopathy, cervical region: Secondary | ICD-10-CM

## 2017-06-07 DIAGNOSIS — M455 Ankylosing spondylitis of thoracolumbar region: Secondary | ICD-10-CM

## 2017-06-07 DIAGNOSIS — E119 Type 2 diabetes mellitus without complications: Secondary | ICD-10-CM | POA: Diagnosis not present

## 2017-06-07 DIAGNOSIS — K219 Gastro-esophageal reflux disease without esophagitis: Secondary | ICD-10-CM | POA: Diagnosis not present

## 2017-06-07 DIAGNOSIS — M7061 Trochanteric bursitis, right hip: Secondary | ICD-10-CM

## 2017-06-07 DIAGNOSIS — Z79899 Other long term (current) drug therapy: Secondary | ICD-10-CM

## 2017-06-07 DIAGNOSIS — G8929 Other chronic pain: Secondary | ICD-10-CM | POA: Diagnosis not present

## 2017-06-07 DIAGNOSIS — M542 Cervicalgia: Secondary | ICD-10-CM

## 2017-06-07 DIAGNOSIS — F329 Major depressive disorder, single episode, unspecified: Secondary | ICD-10-CM | POA: Insufficient documentation

## 2017-06-07 DIAGNOSIS — Z87891 Personal history of nicotine dependence: Secondary | ICD-10-CM | POA: Diagnosis not present

## 2017-06-07 DIAGNOSIS — F419 Anxiety disorder, unspecified: Secondary | ICD-10-CM | POA: Diagnosis not present

## 2017-06-07 MED ORDER — MORPHINE SULFATE ER 60 MG PO TBCR: 60.0000 mg | EXTENDED_RELEASE_TABLET | Freq: Three times a day (TID) | ORAL | 0 refills | Status: DC

## 2017-06-07 MED ORDER — HYDROCODONE-ACETAMINOPHEN 7.5-325 MG PO TABS: 1.0000 | ORAL_TABLET | Freq: Four times a day (QID) | ORAL | 0 refills | Status: DC | PRN

## 2017-06-07 NOTE — Progress Notes (Signed)
Subjective:    Patient ID: Daniel Rivers, male    DOB: 1951-01-31, 67 y.o.   MRN: [id number]  HPI: Daniel Rivers is a 67 year old male who returns for follow up appointment for chronic pain and medication refill. He states his pain is located in his neck, bilateral shoulders and bilateral hips. He rates his pain 5. His current exercise regime is walking and performing stretching exercises with bands.   Daniel Rivers Morphine Equivalent is 203.00 MME.  Last UDS was Performed on 04/02/2017, it was consistent.   Pain Inventory Average Pain 6 Pain Right Now 5 My pain is sharp, burning, dull, stabbing, tingling and aching  In the last 24 hours, has pain interfered with the following? General activity 5 Relation with others 3 Enjoyment of life 5 What TIME of day is your pain at its worst? daytime Sleep (in general) Fair  Pain is worse with: walking, bending, sitting, standing and some activites Pain improves with: rest, heat/ice, therapy/exercise, pacing activities and medication Relief from Meds: 5  Mobility use a cane how many minutes can you walk? 15 ability to climb steps?  yes do you drive?  yes  Function retired I need assistance with the following:  household duties  Neuro/Psych weakness numbness tingling spasms confusion  Prior Studies Any changes since last visit?  no  Physicians involved in your care Any changes since last visit?  no   No family history on file. Social History   Socioeconomic History  . Marital status: Widowed    Spouse name: Not on file  . Number of children: Not on file  . Years of education: Not on file  . Highest education level: Not on file  Occupational History  . Not on file  Social Needs  . Financial resource strain: Not on file  . Food insecurity:    Worry: Not on file    Inability: Not on file  . Transportation needs:    Medical: Not on file    Non-medical: Not on file  Tobacco Use  . Smoking status: Former  Smoker    Packs/day: 5.00    Years: 23.00    Pack years: 115.00    Types: Cigarettes    Last attempt to quit: 06/16/1986    Years since quitting: 30.9  . Smokeless tobacco: Never Used  Substance and Sexual Activity  . Alcohol use: Yes    Comment: 05/22/2014  "used to drink casually; never had problem w/it; none since ~ 2004"  . Drug use: No  . Sexual activity: Never  Lifestyle  . Physical activity:    Days per week: Not on file    Minutes per session: Not on file  . Stress: Not on file  Relationships  . Social connections:    Talks on phone: Not on file    Gets together: Not on file    Attends religious service: Not on file    Active member of club or organization: Not on file    Attends meetings of clubs or organizations: Not on file    Relationship status: Not on file  Other Topics Concern  . Not on file  Social History Narrative  . Not on file   Past Surgical History:  Procedure Laterality Date  . ANAL FISSURE REPAIR    . ANKLE FRACTURE SURGERY Left   . BACK SURGERY    . BONE TISSUE     "I've had removal of excess tissue as a result of  my ankylosing spondylitis"  . CARDIAC CATHETERIZATION N/A 06/07/2014   Procedure: Coronary Stent Intervention;  Surgeon: Adrian Prows, MD;  Location: [facility];  Service: Cardiovascular;  Laterality: N/A;  . CARPAL TUNNEL RELEASE Bilateral   . COLON SURGERY    . CORONARY ANGIOPLASTY WITH STENT PLACEMENT  2009; 05/22/2014   ?3; ?1  . cortisone injection    . FRACTURE SURGERY    . LEFT HEART CATHETERIZATION WITH CORONARY ANGIOGRAM N/A 05/22/2014   Procedure: LEFT HEART CATHETERIZATION WITH CORONARY ANGIOGRAM;  Surgeon: Adrian Prows, MD;  Location: [facility];  Service: Cardiovascular;  Laterality: N/A;  . LUMBAR DISC SURGERY  X 2   "ruptured/herniated/growing together"  . PERCUTANEOUS CORONARY STENT INTERVENTION (PCI-S)  05/22/2014   Procedure: PERCUTANEOUS CORONARY STENT INTERVENTION (PCI-S);  Surgeon: Adrian Prows, MD;  Location:  [facility] C[facility];  Service: Cardiovascular;;  mid LAD DES  . PERCUTANEOUS CORONARY STENT INTERVENTION (PCI-S)  06/07/2014   des to rca  . SHOULDER ARTHROSCOPY W/ ROTATOR CUFF REPAIR Bilateral   . SMALL INTESTINE SURGERY  1960   "took out 14 inches"  . ULNAR TUNNEL RELEASE Left    Past Medical History:  Diagnosis Date  . Ankylosing spondylitis of cervical region ([place])   . Ankylosing spondylitis of lumbar region ([place])   . Anxiety   . BPH (benign prostatic hypertrophy)   . Bursitis, trochanteric   . Chronic back pain   . Chronic cervical pain   . Coronary artery disease   . Daily headache    "recently; related to RX for my heart" (05/21/2014)  . Depression   . GERD (gastroesophageal reflux disease)   . Hemochromatosis   . High cholesterol   . History of hiatal hernia   . Hypertension   . Non-alcoholic cirrhosis ([place])    "from the hemochromatosis"  . Obesity   . Osteoarthritis    "joints" (05/20/2104)  . Rotator cuff syndrome   . Tendonitis   . Type II diabetes mellitus (H[facility])    BP 117/72   Pulse 86   Ht 6' 3.5" (1.918 m) Comment: pt reported  Wt 260 lb 12.8 oz (118.3 kg)   SpO2 96%   BMI 32.17 kg/m   Opioid Risk Score:   Fall Risk Score:  `1  Depression screen PHQ 2/9  Depression screen [facility] 2/9 06/07/2017 12/03/2016 10/07/2016 06/04/2016 04/03/2016 07/22/2015 04/22/2015  Decreased Interest 0 0 0 0 0 0 2  Down, Depressed, Hopeless 0 0 0 0 0 0 0  PHQ - 2 Score 0 0 0 0 0 0 2  Altered sleeping - - - - - - -  Tired, decreased energy - - - - - - -  Change in appetite - - - - - - -  Feeling bad or failure about yourself  - - - - - - -  Trouble concentrating - - - - - - -  Moving slowly or fidgety/restless - - - - - - -  Suicidal thoughts - - - - - - -  PHQ-9 Score - - - - - - -   Review of Systems  Constitutional: Negative.   HENT: Negative.   Eyes: Negative.   Respiratory: Negative.   Cardiovascular: Negative.   Gastrointestinal: Negative.   Endocrine: Negative.     Genitourinary: Negative.   Musculoskeletal: Negative.   Skin: Negative.   Allergic/Immunologic: Negative.   Neurological: Negative.   Hematological: Negative.   Psychiatric/Behavioral: Negative.   All other systems reviewed and  are negative.      Objective:   Physical Exam  Constitutional: He is oriented to person, place, and time. He appears well-developed and well-nourished.  HENT:  Head: Normocephalic and atraumatic.  Neck: Normal range of motion. Neck supple.  Cervical Paraspinal Tenderness: C-5-C-6  Cardiovascular: Normal rate and regular rhythm.  Pulmonary/Chest: Effort normal and breath sounds normal.  Musculoskeletal:  Normal Muscle Bulk and Muscle Testing Reveals: Upper Extremities: Full ROM and Muscle Strength 5/5 Thoracic Paraspinal Tenderness: T-1-T-3 L>R Lumbar Paraspinal Tenderness: L-4-L-5 Bilateral Greater Trochanter Bursitis Lower Extremities: Full ROM and Muscle Strength 5/5 Arises from Table with ease Narrow Based Gait  Neurological: He is alert and oriented to person, place, and time.  Skin: Skin is warm and dry.  Psychiatric: He has a normal mood and affect.  Nursing note and vitals reviewed.         Assessment & Plan:  1. Ankylosing spondylitis of the cervical and lumbar spine: 06/07/2017 Refilled: Hydrocodone 7.5/325mg  one tablet every 6 hours as needed #120 and MS Contin 60 mg one tablet three times a day #90. Second script e-scribe for the following month. 06/07/2017 2. Disorder of rotator cuff of both shoulders: Continue HEP as tolerated and Medication Regimen. 06/07/2017 3. Cervicalgia/ Cervical Radiculitis: Refuses Gabapentin. Continue to Monitor. 06/07/2017. 4. Bilateral Greater Trochanteric Bursitis: Continue Ice and Heat Therapy. 06/07/2017.  20 minutes of face to face patient care time was spent during this visit. All questions were encouraged and answered.  F/U in 2 months

## 2017-06-13 ENCOUNTER — Encounter: Payer: Self-pay | Admitting: Registered Nurse

## 2017-06-16 ENCOUNTER — Inpatient Hospital Stay: Payer: Medicare Other | Attending: Oncology

## 2017-06-16 DIAGNOSIS — Z79899 Other long term (current) drug therapy: Secondary | ICD-10-CM | POA: Diagnosis not present

## 2017-06-16 DIAGNOSIS — D696 Thrombocytopenia, unspecified: Secondary | ICD-10-CM | POA: Insufficient documentation

## 2017-06-16 DIAGNOSIS — Z862 Personal history of diseases of the blood and blood-forming organs and certain disorders involving the immune mechanism: Secondary | ICD-10-CM

## 2017-06-16 DIAGNOSIS — K7460 Unspecified cirrhosis of liver: Secondary | ICD-10-CM | POA: Diagnosis not present

## 2017-06-16 LAB — CMP (CANCER CENTER ONLY)
ALBUMIN: 4.2 g/dL (ref 3.5–5.0)
ALT: 54 U/L (ref 0–55)
ANION GAP: 6 (ref 3–11)
AST: 64 U/L — ABNORMAL HIGH (ref 5–34)
Alkaline Phosphatase: 69 U/L (ref 40–150)
BILIRUBIN TOTAL: 1.3 mg/dL — AB (ref 0.2–1.2)
BUN: 16 mg/dL (ref 7–26)
CO2: 31 mmol/L — ABNORMAL HIGH (ref 22–29)
Calcium: 10.5 mg/dL — ABNORMAL HIGH (ref 8.4–10.4)
Chloride: 102 mmol/L (ref 98–109)
Creatinine: 0.91 mg/dL (ref 0.70–1.30)
GFR, Estimated: >60 mL/min (ref >60)
GLUCOSE: 90 mg/dL (ref 70–140)
Potassium: 4.5 mmol/L (ref 3.5–5.1)
Sodium: 139 mmol/L (ref 136–145)
TOTAL PROTEIN: 8 g/dL (ref 6.4–8.3)

## 2017-06-16 LAB — CBC WITH DIFFERENTIAL (CANCER CENTER ONLY)
BASOS ABS: 0 10*3/uL (ref 0.0–0.1)
Basophils Relative: 1 %
Eosinophils Absolute: 0.1 10*3/uL (ref 0.0–0.5)
Eosinophils Relative: 2 %
HCT: 43.5 % (ref 38.4–49.9)
Hemoglobin: 14.6 g/dL (ref 13.0–17.1)
Lymphocytes Relative: 28 %
Lymphs Abs: 1.5 10*3/uL (ref 0.9–3.3)
MCH: 30 pg (ref 27.2–33.4)
MCHC: 33.6 g/dL (ref 32.0–36.0)
MCV: 89.2 fL (ref 79.3–98.0)
MONO ABS: 0.6 10*3/uL (ref 0.1–0.9)
Monocytes Relative: 11 %
NEUTROS ABS: 3.2 10*3/uL (ref 1.5–6.5)
NEUTROS PCT: 58 %
PLATELETS: 124 10*3/uL — AB (ref 140–400)
RBC: 4.88 MIL/uL (ref 4.20–5.82)
RDW: 14.2 % (ref 11.0–14.6)
WBC Count: 5.4 10*3/uL (ref 4.0–10.3)

## 2017-06-16 LAB — IRON AND TIBC
Iron: 96 ug/dL (ref 42–163)
SATURATION RATIOS: 27 % — AB (ref 42–163)
TIBC: 363 ug/dL (ref 202–409)
UIBC: 266 ug/dL

## 2017-06-16 LAB — FERRITIN: Ferritin: 115 ng/mL (ref 22–316)

## 2017-06-23 ENCOUNTER — Telehealth: Payer: Self-pay | Admitting: Oncology

## 2017-06-23 ENCOUNTER — Inpatient Hospital Stay (HOSPITAL_BASED_OUTPATIENT_CLINIC_OR_DEPARTMENT_OTHER): Payer: Medicare Other | Admitting: Oncology

## 2017-06-23 ENCOUNTER — Inpatient Hospital Stay: Payer: Medicare Other

## 2017-06-23 VITALS — BP 122/76 | HR 77 | Temp 97.9°F | Resp 18 | Ht 75.5 in | Wt 255.8 lb

## 2017-06-23 VITALS — BP 101/71 | HR 88 | Temp 97.7°F | Resp 18

## 2017-06-23 DIAGNOSIS — D696 Thrombocytopenia, unspecified: Secondary | ICD-10-CM

## 2017-06-23 DIAGNOSIS — K7460 Unspecified cirrhosis of liver: Secondary | ICD-10-CM

## 2017-06-23 DIAGNOSIS — Z79899 Other long term (current) drug therapy: Secondary | ICD-10-CM

## 2017-06-23 DIAGNOSIS — Z862 Personal history of diseases of the blood and blood-forming organs and certain disorders involving the immune mechanism: Secondary | ICD-10-CM

## 2017-06-23 NOTE — Patient Instructions (Signed)

## 2017-06-23 NOTE — Progress Notes (Signed)
Daniel Rivers presents today for phlebotomy per MD orders. Phlebotomy procedure started at 1020 and ended at 1028. 532 grams removed using 16g phlebotomy kit to right AC. Nutrition offered. VS stable.  Patient observed for 30 minutes after procedure without any incident. Patient tolerated procedure well. IV needle removed intact.

## 2017-06-23 NOTE — Progress Notes (Signed)
Hematology and Oncology Follow Up Visit  Daniel Rivers [id number] 04-Mar-1950 67 y.o. 06/23/2017 9:51 AM Daniel Rivers, MDKim, Daniel Rinks, MD   Principle Diagnosis: 67 year old with hemochromatosis resulting om liver cirrhosis diagnosed in 2009.  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   Prior Therapy: He have been receiving intermittent phlebotomies in the past.  Current therapy: Phlebotomy every 3 months to achieve ferritin level close to 50.   Interim History:  Daniel Rivers presents today for a follow-up visit.  Since the last visit he reports no recent complaints or changes.  He continues to receive phlebotomy every 3 months without any specific issues or complaints.  He tolerates this procedure well without any lightheadedness, dizziness or falls.  His pain has been a chronic in nature and has not changed.  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  He has not reported any headaches or blurry vision or syncope or seizures.  He does not report any fevers or chills or sweats.  He does not report any chest pain, palpitation or orthopnea.  He does not report any cough or wheezing.  He denies any nausea, vomiting or abdominal pain.  He does not report any hematochezia or melena.  Has not had any  frequency urgency or hesitancy. Has not reported any lymphadenopathy or petechiae. Has not reported any easy bruisability or any bleeding.  He does not report any arthralgias or myalgias.  The rest of his review of symptoms is negative.  Medications: I have reviewed the patient's current medications.  Current Outpatient Medications  Medication Sig Dispense Refill  . ACCU-CHEK FASTCLIX LANCETS MISC     . aspirin 81 MG tablet Take 81 mg by mouth daily.     . bisacodyl (DULCOLAX) 5 MG EC tablet Take 5 mg by mouth at bedtime. 2 at bedtime    . buPROPion (WELLBUTRIN XL) 300 MG 24 hr tablet Take 1 tablet by mouth daily.    .  carvedilol (COREG) 3.125 MG tablet Take 3.125 mg by mouth 2 (two) times daily.    . Cholecalciferol (VITAMIN D3) 2000 UNITS TABS Take 1 tablet by mouth daily.    . diclofenac (VOLTAREN) 75 MG EC tablet Take 1 tablet (75 mg total) by mouth 2 (two) times daily with a meal. As needed 60 tablet 2  . furosemide (LASIX) 20 MG tablet Take 20 mg by mouth as needed for fluid.     [name] glimepiride (AMARYL) 2 MG tablet Take 1 mg by mouth daily before breakfast.     . HYDROcodone-acetaminophen (NORCO) 7.5-325 MG tablet Take 1 tablet by mouth every 6 (six) hours as needed (pain). 1 month supply 120 tablet 0  . KRILL OIL PO Take 1 tablet by mouth daily.     [name] losartan (COZAAR) 100 MG tablet Take 100 mg by mouth daily.    . Magnesium Oxide 400 MG CAPS Take 1 capsule by mouth daily.    . metFORMIN (GLUCOPHAGE) 1000 MG tablet Take 1,000 mg by mouth every 12 (twelve) hours.    [name] morphine (MS CONTIN) 60 MG 12 hr tablet Take 1 tablet (60 mg total) by mouth 3 (three) times daily. 90 tablet 0  . Multiple Vitamin (MULTIVITAMIN) capsule Take 1 capsule by mouth daily. NO IRON    . NITROSTAT 0.4 MG SL tablet Place 0.4 mg under the tongue every 5 (five) minutes as needed for chest pain.     . polyethylene  glycol powder (GLYCOLAX/MIRALAX) powder Using measuring cap mix  17gm in water and drink two times daily 3162 g 11  . pravastatin (PRAVACHOL) 80 MG tablet Take 80 mg by mouth daily.    . Psyllium (METAMUCIL PO) Take 17 g/day by mouth 2 (two) times daily.    [name] zolpidem (AMBIEN) 10 MG tablet Take 10 mg by mouth at bedtime as needed for sleep.      No current facility-administered medications for this visit.      Allergies:  Allergies  Allergen Reactions  . Amlodipine Besy-Benazepril Hcl Cough  . Naproxen Other (See Comments)    Worsens Tinnitus    Past Medical History, Surgical history, Social history, and Family History reviewed and remain unchanged.  [name] Physical Exam: Blood pressure 122/76, pulse 77, temperature  97.9 F (36.6 C), temperature source Oral, resp. rate 18, height 6' 3.5" (1.918 m), weight 255 lb 12.8 oz (116 kg), SpO2 (!) 77 %.   ECOG: 1 General appearance: Well-appearing gentleman but is comfortable. Head: Atraumatic without abnormalities. Oropharynx: Mucous membranes are moist and pink without any thrush or ulcers. Eyes: Pupils are equal and round reactive to light. Lymph nodes: No lymphadenopathy noted in the cervical, supraclavicular, and axillary nodes  Heart:regular rate and rhythm, without any murmurs or gallops. Lung: Clear to auscultation without any rhonchi, wheezes or dullness to percussion. Abdomin: Soft, nontender without any rebound or guarding. Musculoskeletal: No clubbing or cyanosis.  Full range of motion noted all extremities. Skin: No petechia or ecchymosis. Neurological: No motor or sensory deficits.   Lab Results: Lab Results  Component Value Date   WBC 5.4 06/16/2017   HGB 14.6 06/16/2017   HCT 43.5 06/16/2017   MCV 89.2 06/16/2017   PLT 124 (L) 06/16/2017     Chemistry      Component Value Date/Time   NA 139 06/16/2017 1049   NA 138 12/11/2016 0931   K 4.5 06/16/2017 1049   K 4.1 12/11/2016 0931   CL 102 06/16/2017 1049   CO2 31 (H) 06/16/2017 1049   CO2 28 12/11/2016 0931   BUN 16 06/16/2017 1049   BUN 13.1 12/11/2016 0931   CREATININE 0.91 06/16/2017 1049   CREATININE 0.8 12/11/2016 0931      Component Value Date/Time   CALCIUM 10.5 (H) 06/16/2017 1049   CALCIUM 9.6 12/11/2016 0931   ALKPHOS 69 06/16/2017 1049   ALKPHOS 63 12/11/2016 0931   AST 64 (H) 06/16/2017 1049   AST 62 (H) 12/11/2016 0931   ALT 54 06/16/2017 1049   ALT 55 12/11/2016 0931   BILITOT 1.3 (H) 06/16/2017 1049   BILITOT 1.37 (H) 12/11/2016 0931       Results for Daniel Rivers (MRN [id number]) as of 06/23/2017 09:52  Ref. Range 03/22/2017 10:57 06/16/2017 10:49  Iron Latest Ref Range: 42 - 163 ug/dL 103 96  UIBC Latest Units: ug/dL 257 266  TIBC Latest Ref  Range: 202 - 409 ug/dL 360 363  Saturation Ratios Latest Ref Range: 42 - 163 % 29 (L) 27 (L)  Ferritin Latest Ref Range: 22 - 316 ng/mL 105 115     Impression and Plan:  67 year old man with:  1.  Double heterozygous hemachromatosis diagnosed in 2009: He developed cirrhosis of the liver although it is unclear is related to hemochromatosis.  He is currently receiving intermittent phlebotomy to keep his ferritin below 50.  His labs obtained on Jun 16, 2017 were personally reviewed and discussed with the patient today.  His iron  level continues to be under reasonable control although his ferritin slightly increased.  Risks and benefits of continuing this current schedule with phlebotomy every 3 months was debated and discussed today.  At this time we will keep it at the same schedule for convenience and preference at this time.  We will continue to monitor his ferritin level and increase the frequency if needed to in the future.    2. Cirrhosis of the liver: He is followed by Dr. Benson Norway with liver function tests appear to be stable.  3. Thrombocytopenia: Minor in nature and likely related to his liver disease.  No bleeding or intervention is needed.  4. Follow-up: Will be in 3 months for repeat laboratory testing and phlebotomy.  15  minutes was spent with the patient face-to-face today.  More than 50% of time was dedicated to patient counseling, education and answering questions regarding his future plan of care.  Zola Button, MD 5/22/20199:51 AM

## 2017-06-23 NOTE — Telephone Encounter (Signed)
Appointments scheduled AVS/Calendar printed per 5/22 los °

## 2017-06-28 ENCOUNTER — Ambulatory Visit (HOSPITAL_COMMUNITY)
Admission: EM | Admit: 2017-06-28 | Discharge: 2017-06-29 | Disposition: A | Discharge status: Home / Self Care | Payer: Medicare Other | Attending: Emergency Medicine | Admitting: Emergency Medicine

## 2017-06-28 ENCOUNTER — Encounter (HOSPITAL_COMMUNITY): Payer: Self-pay | Admitting: Emergency Medicine

## 2017-06-28 ENCOUNTER — Emergency Department (HOSPITAL_COMMUNITY): Payer: Medicare Other

## 2017-06-28 DIAGNOSIS — M1990 Unspecified osteoarthritis, unspecified site: Secondary | ICD-10-CM | POA: Insufficient documentation

## 2017-06-28 DIAGNOSIS — S62307B Unspecified fracture of fifth metacarpal bone, left hand, initial encounter for open fracture: Secondary | ICD-10-CM | POA: Diagnosis not present

## 2017-06-28 DIAGNOSIS — S62617B Displaced fracture of proximal phalanx of left little finger, initial encounter for open fracture: Secondary | ICD-10-CM | POA: Diagnosis not present

## 2017-06-28 DIAGNOSIS — S61402A Unspecified open wound of left hand, initial encounter: Secondary | ICD-10-CM | POA: Diagnosis not present

## 2017-06-28 DIAGNOSIS — Z6831 Body mass index (BMI) 31.0-31.9, adult: Secondary | ICD-10-CM | POA: Insufficient documentation

## 2017-06-28 DIAGNOSIS — Z886 Allergy status to analgesic agent status: Secondary | ICD-10-CM | POA: Diagnosis not present

## 2017-06-28 DIAGNOSIS — I10 Essential (primary) hypertension: Secondary | ICD-10-CM | POA: Diagnosis not present

## 2017-06-28 DIAGNOSIS — Z79899 Other long term (current) drug therapy: Secondary | ICD-10-CM | POA: Insufficient documentation

## 2017-06-28 DIAGNOSIS — N400 Enlarged prostate without lower urinary tract symptoms: Secondary | ICD-10-CM | POA: Diagnosis not present

## 2017-06-28 DIAGNOSIS — K7469 Other cirrhosis of liver: Secondary | ICD-10-CM | POA: Insufficient documentation

## 2017-06-28 DIAGNOSIS — Z7984 Long term (current) use of oral hypoglycemic drugs: Secondary | ICD-10-CM | POA: Diagnosis not present

## 2017-06-28 DIAGNOSIS — G8929 Other chronic pain: Secondary | ICD-10-CM | POA: Insufficient documentation

## 2017-06-28 DIAGNOSIS — Z888 Allergy status to other drugs, medicaments and biological substances status: Secondary | ICD-10-CM | POA: Insufficient documentation

## 2017-06-28 DIAGNOSIS — E7800 Pure hypercholesterolemia, unspecified: Secondary | ICD-10-CM | POA: Diagnosis not present

## 2017-06-28 DIAGNOSIS — S61239A Puncture wound without foreign body of unspecified finger without damage to nail, initial encounter: Secondary | ICD-10-CM

## 2017-06-28 DIAGNOSIS — Z955 Presence of coronary angioplasty implant and graft: Secondary | ICD-10-CM | POA: Insufficient documentation

## 2017-06-28 DIAGNOSIS — S62635B Displaced fracture of distal phalanx of left ring finger, initial encounter for open fracture: Secondary | ICD-10-CM | POA: Diagnosis not present

## 2017-06-28 DIAGNOSIS — I25119 Atherosclerotic heart disease of native coronary artery with unspecified angina pectoris: Secondary | ICD-10-CM | POA: Diagnosis not present

## 2017-06-28 DIAGNOSIS — W3400XA Accidental discharge from unspecified firearms or gun, initial encounter: Secondary | ICD-10-CM | POA: Diagnosis not present

## 2017-06-28 DIAGNOSIS — M791 Myalgia, unspecified site: Secondary | ICD-10-CM | POA: Diagnosis not present

## 2017-06-28 DIAGNOSIS — Z79891 Long term (current) use of opiate analgesic: Secondary | ICD-10-CM | POA: Diagnosis not present

## 2017-06-28 DIAGNOSIS — Z87891 Personal history of nicotine dependence: Secondary | ICD-10-CM | POA: Insufficient documentation

## 2017-06-28 DIAGNOSIS — K449 Diaphragmatic hernia without obstruction or gangrene: Secondary | ICD-10-CM | POA: Insufficient documentation

## 2017-06-28 DIAGNOSIS — E119 Type 2 diabetes mellitus without complications: Secondary | ICD-10-CM | POA: Insufficient documentation

## 2017-06-28 DIAGNOSIS — M79642 Pain in left hand: Secondary | ICD-10-CM | POA: Diagnosis not present

## 2017-06-28 DIAGNOSIS — F329 Major depressive disorder, single episode, unspecified: Secondary | ICD-10-CM | POA: Insufficient documentation

## 2017-06-28 DIAGNOSIS — F419 Anxiety disorder, unspecified: Secondary | ICD-10-CM | POA: Diagnosis not present

## 2017-06-28 DIAGNOSIS — T148XXA Other injury of unspecified body region, initial encounter: Secondary | ICD-10-CM | POA: Diagnosis not present

## 2017-06-28 DIAGNOSIS — E669 Obesity, unspecified: Secondary | ICD-10-CM | POA: Diagnosis not present

## 2017-06-28 DIAGNOSIS — Z7982 Long term (current) use of aspirin: Secondary | ICD-10-CM | POA: Insufficient documentation

## 2017-06-28 DIAGNOSIS — S61205A Unspecified open wound of left ring finger without damage to nail, initial encounter: Secondary | ICD-10-CM | POA: Insufficient documentation

## 2017-06-28 DIAGNOSIS — Z9889 Other specified postprocedural states: Secondary | ICD-10-CM | POA: Insufficient documentation

## 2017-06-28 MED ORDER — TETANUS-DIPHTH-ACELL PERTUSSIS 5-2.5-18.5 LF-MCG/0.5 IM SUSP
0.5000 mL | Freq: Once | INTRAMUSCULAR | Status: AC
  Administered 2017-06-28: 0.5 mL via INTRAMUSCULAR
  Filled 2017-06-28: qty 0.5

## 2017-06-28 MED ORDER — CEFAZOLIN SODIUM-DEXTROSE 2-4 GM/100ML-% IV SOLN
2.0000 g | Freq: Once | INTRAVENOUS | Status: AC
  Administered 2017-06-28: 2 g via INTRAVENOUS
  Filled 2017-06-28: qty 100
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
## 2017-06-29 ENCOUNTER — Emergency Department (HOSPITAL_COMMUNITY): Payer: Medicare Other | Admitting: Certified Registered"

## 2017-06-29 ENCOUNTER — Encounter (HOSPITAL_COMMUNITY)
Admission: EM | Disposition: A | Discharge status: Home / Self Care | Payer: Self-pay | Source: Home / Self Care | Attending: Emergency Medicine

## 2017-06-29 ENCOUNTER — Encounter (HOSPITAL_COMMUNITY): Payer: Self-pay | Admitting: Certified Registered"

## 2017-06-29 DIAGNOSIS — Z79891 Long term (current) use of opiate analgesic: Secondary | ICD-10-CM | POA: Diagnosis not present

## 2017-06-29 DIAGNOSIS — N400 Enlarged prostate without lower urinary tract symptoms: Secondary | ICD-10-CM | POA: Diagnosis not present

## 2017-06-29 DIAGNOSIS — I10 Essential (primary) hypertension: Secondary | ICD-10-CM | POA: Diagnosis not present

## 2017-06-29 DIAGNOSIS — Z7982 Long term (current) use of aspirin: Secondary | ICD-10-CM | POA: Diagnosis not present

## 2017-06-29 DIAGNOSIS — S61205A Unspecified open wound of left ring finger without damage to nail, initial encounter: Secondary | ICD-10-CM | POA: Diagnosis not present

## 2017-06-29 DIAGNOSIS — W3400XA Accidental discharge from unspecified firearms or gun, initial encounter: Secondary | ICD-10-CM

## 2017-06-29 DIAGNOSIS — Z79899 Other long term (current) drug therapy: Secondary | ICD-10-CM | POA: Diagnosis not present

## 2017-06-29 DIAGNOSIS — E119 Type 2 diabetes mellitus without complications: Secondary | ICD-10-CM | POA: Diagnosis not present

## 2017-06-29 DIAGNOSIS — S61239A Puncture wound without foreign body of unspecified finger without damage to nail, initial encounter: Secondary | ICD-10-CM

## 2017-06-29 DIAGNOSIS — I2510 Atherosclerotic heart disease of native coronary artery without angina pectoris: Secondary | ICD-10-CM | POA: Diagnosis not present

## 2017-06-29 DIAGNOSIS — Z87891 Personal history of nicotine dependence: Secondary | ICD-10-CM | POA: Diagnosis not present

## 2017-06-29 DIAGNOSIS — S62307B Unspecified fracture of fifth metacarpal bone, left hand, initial encounter for open fracture: Secondary | ICD-10-CM | POA: Diagnosis not present

## 2017-06-29 DIAGNOSIS — F419 Anxiety disorder, unspecified: Secondary | ICD-10-CM | POA: Diagnosis not present

## 2017-06-29 DIAGNOSIS — S61402A Unspecified open wound of left hand, initial encounter: Secondary | ICD-10-CM | POA: Diagnosis not present

## 2017-06-29 DIAGNOSIS — S62617B Displaced fracture of proximal phalanx of left little finger, initial encounter for open fracture: Secondary | ICD-10-CM | POA: Diagnosis not present

## 2017-06-29 DIAGNOSIS — Z7984 Long term (current) use of oral hypoglycemic drugs: Secondary | ICD-10-CM | POA: Diagnosis not present

## 2017-06-29 HISTORY — PX: I & D EXTREMITY: SHX5045

## 2017-06-29 LAB — CBC WITH DIFFERENTIAL/PLATELET
ABS IMMATURE GRANULOCYTES: 0.1 10*3/uL (ref 0.0–0.1)
BASOS ABS: 0.1 10*3/uL (ref 0.0–0.1)
Basophils Relative: 1 %
Eosinophils Absolute: 0.1 10*3/uL (ref 0.0–0.7)
Eosinophils Relative: 2 %
HCT: 37.3 % — ABNORMAL LOW (ref 39.0–52.0)
HEMOGLOBIN: 12.6 g/dL — AB (ref 13.0–17.0)
IMMATURE GRANULOCYTES: 1 %
LYMPHS PCT: 32 %
Lymphs Abs: 1.5 10*3/uL (ref 0.7–4.0)
MCH: 29.6 pg (ref 26.0–34.0)
MCHC: 33.8 g/dL (ref 30.0–36.0)
MCV: 87.8 fL (ref 78.0–100.0)
MONO ABS: 0.6 10*3/uL (ref 0.1–1.0)
Monocytes Relative: 12 %
NEUTROS ABS: 2.4 10*3/uL (ref 1.7–7.7)
NEUTROS PCT: 52 %
Platelets: 123 10*3/uL — ABNORMAL LOW (ref 150–400)
RBC: 4.25 MIL/uL (ref 4.22–5.81)
RDW: 13.3 % (ref 11.5–15.5)
WBC: 4.6 10*3/uL (ref 4.0–10.5)

## 2017-06-29 LAB — SAMPLE TO BLOOD BANK

## 2017-06-29 LAB — BASIC METABOLIC PANEL
Anion gap: 10 (ref 5–15)
BUN: 14 mg/dL (ref 6–20)
CALCIUM: 9.4 mg/dL (ref 8.9–10.3)
CO2: 26 mmol/L (ref 22–32)
CREATININE: 0.97 mg/dL (ref 0.61–1.24)
Chloride: 99 mmol/L — ABNORMAL LOW (ref 101–111)
GFR calc non Af Amer: >60 mL/min (ref >60)
Glucose, Bld: 222 mg/dL — ABNORMAL HIGH (ref 65–99)
Potassium: 4 mmol/L (ref 3.5–5.1)
SODIUM: 135 mmol/L (ref 135–145)

## 2017-06-29 LAB — GLUCOSE, CAPILLARY: GLUCOSE-CAPILLARY: 169 mg/dL — AB (ref 65–99)

## 2017-06-29 SURGERY — IRRIGATION AND DEBRIDEMENT EXTREMITY
Anesthesia: General | Site: Hand | Laterality: Left

## 2017-06-29 MED ORDER — GLIMEPIRIDE 1 MG PO TABS
1.0000 mg | ORAL_TABLET | Freq: Every day | ORAL | Status: DC
  Administered 2017-06-29: 1 mg via ORAL
  Filled 2017-06-29: qty 1

## 2017-06-29 MED ORDER — PHENYLEPHRINE 40 MCG/ML (10ML) SYRINGE FOR IV PUSH (FOR BLOOD PRESSURE SUPPORT)
PREFILLED_SYRINGE | INTRAVENOUS | Status: AC
  Filled 2017-06-29: qty 10

## 2017-06-29 MED ORDER — CEPHALEXIN 500 MG PO CAPS: 500.0000 mg | ORAL_CAPSULE | Freq: Four times a day (QID) | ORAL | 0 refills | Status: AC

## 2017-06-29 MED ORDER — HYDROMORPHONE HCL 2 MG/ML IJ SOLN
INTRAMUSCULAR | Status: AC
  Filled 2017-06-29: qty 1

## 2017-06-29 MED ORDER — FENTANYL CITRATE (PF) 100 MCG/2ML IJ SOLN
INTRAMUSCULAR | Status: AC
  Filled 2017-06-29: qty 2

## 2017-06-29 MED ORDER — MIDAZOLAM HCL 5 MG/5ML IJ SOLN
INTRAMUSCULAR | Status: DC | PRN
  Administered 2017-06-29: 2 mg via INTRAVENOUS

## 2017-06-29 MED ORDER — FUROSEMIDE 20 MG PO TABS: 20.0000 mg | ORAL_TABLET | Freq: Every day | ORAL | Status: DC | PRN

## 2017-06-29 MED ORDER — BISACODYL 5 MG PO TBEC: 5.0000 mg | DELAYED_RELEASE_TABLET | Freq: Every day | ORAL | Status: DC

## 2017-06-29 MED ORDER — KETOROLAC TROMETHAMINE 30 MG/ML IJ SOLN
30.0000 mg | Freq: Once | INTRAMUSCULAR | Status: AC
  Administered 2017-06-29: 30 mg via INTRAVENOUS

## 2017-06-29 MED ORDER — METFORMIN HCL 500 MG PO TABS
1000.0000 mg | ORAL_TABLET | Freq: Two times a day (BID) | ORAL | Status: DC
  Administered 2017-06-29: 1000 mg via ORAL
  Filled 2017-06-29: qty 2

## 2017-06-29 MED ORDER — LIDOCAINE HCL (CARDIAC) PF 100 MG/5ML IV SOSY
PREFILLED_SYRINGE | INTRAVENOUS | Status: DC | PRN
  Administered 2017-06-29: 100 mg via INTRATRACHEAL

## 2017-06-29 MED ORDER — FENTANYL CITRATE (PF) 100 MCG/2ML IJ SOLN
25.0000 ug | INTRAMUSCULAR | Status: DC | PRN
  Administered 2017-06-29 (×3): 50 ug via INTRAVENOUS

## 2017-06-29 MED ORDER — ADULT MULTIVITAMIN W/MINERALS CH
1.0000 | ORAL_TABLET | Freq: Every day | ORAL | Status: DC
  Filled 2017-06-29: qty 1

## 2017-06-29 MED ORDER — PROPOFOL 10 MG/ML IV BOLUS
INTRAVENOUS | Status: AC
  Filled 2017-06-29: qty 20

## 2017-06-29 MED ORDER — CEFAZOLIN SODIUM-DEXTROSE 1-4 GM/50ML-% IV SOLN
1.0000 g | Freq: Three times a day (TID) | INTRAVENOUS | Status: DC
  Administered 2017-06-29: 1 g via INTRAVENOUS
  Filled 2017-06-29 (×2): qty 50

## 2017-06-29 MED ORDER — LOSARTAN POTASSIUM 50 MG PO TABS
100.0000 mg | ORAL_TABLET | Freq: Every day | ORAL | Status: DC
  Administered 2017-06-29: 100 mg via ORAL
  Filled 2017-06-29: qty 2

## 2017-06-29 MED ORDER — KCL IN DEXTROSE-NACL 20-5-0.45 MEQ/L-%-% IV SOLN
INTRAVENOUS | Status: DC
  Administered 2017-06-29: 03:00:00 via INTRAVENOUS

## 2017-06-29 MED ORDER — OXYCODONE HCL 5 MG PO TABS: 5.0000 mg | ORAL_TABLET | ORAL | Status: DC | PRN

## 2017-06-29 MED ORDER — ASPIRIN EC 81 MG PO TBEC
81.0000 mg | DELAYED_RELEASE_TABLET | Freq: Every day | ORAL | Status: DC
  Administered 2017-06-29: 81 mg via ORAL
  Filled 2017-06-29: qty 1

## 2017-06-29 MED ORDER — ADULT MULTIVITAMIN W/MINERALS CH
1.0000 | ORAL_TABLET | Freq: Every day | ORAL | Status: DC
  Administered 2017-06-29: 1 via ORAL

## 2017-06-29 MED ORDER — DOCUSATE SODIUM 100 MG PO CAPS
100.0000 mg | ORAL_CAPSULE | Freq: Two times a day (BID) | ORAL | Status: DC
  Administered 2017-06-29: 100 mg via ORAL
  Filled 2017-06-29: qty 1

## 2017-06-29 MED ORDER — SODIUM CHLORIDE 0.9 % IR SOLN
Status: DC | PRN
  Administered 2017-06-29: 1000 mL

## 2017-06-29 MED ORDER — DIPHENHYDRAMINE HCL 25 MG PO CAPS: 25.0000 mg | ORAL_CAPSULE | Freq: Four times a day (QID) | ORAL | Status: DC | PRN

## 2017-06-29 MED ORDER — ZOLPIDEM TARTRATE 5 MG PO TABS: 5.0000 mg | ORAL_TABLET | Freq: Every evening | ORAL | Status: DC | PRN

## 2017-06-29 MED ORDER — KETOROLAC TROMETHAMINE 30 MG/ML IJ SOLN
INTRAMUSCULAR | Status: AC
  Filled 2017-06-29: qty 1

## 2017-06-29 MED ORDER — VITAMIN C 500 MG PO TABS
1000.0000 mg | ORAL_TABLET | Freq: Every day | ORAL | Status: DC
  Administered 2017-06-29: 1000 mg via ORAL
  Filled 2017-06-29: qty 2

## 2017-06-29 MED ORDER — MORPHINE SULFATE ER 60 MG PO TBCR: 60.0000 mg | EXTENDED_RELEASE_TABLET | Freq: Three times a day (TID) | ORAL | 0 refills | Status: AC

## 2017-06-29 MED ORDER — ONDANSETRON HCL 4 MG/2ML IJ SOLN
INTRAMUSCULAR | Status: AC
  Filled 2017-06-29: qty 2

## 2017-06-29 MED ORDER — BUPROPION HCL ER (XL) 150 MG PO TB24
300.0000 mg | ORAL_TABLET | Freq: Every day | ORAL | Status: DC
  Administered 2017-06-29: 300 mg via ORAL
  Filled 2017-06-29: qty 2

## 2017-06-29 MED ORDER — SUFENTANIL CITRATE 50 MCG/ML IV SOLN
INTRAVENOUS | Status: AC
Start: 2017-06-29 — End: ?
  Filled 2017-06-29: qty 1

## 2017-06-29 MED ORDER — SUFENTANIL CITRATE 50 MCG/ML IV SOLN
INTRAVENOUS | Status: DC | PRN
  Administered 2017-06-29: 5 ug via INTRAVENOUS
  Administered 2017-06-29 (×2): 10 ug via INTRAVENOUS

## 2017-06-29 MED ORDER — PROPOFOL 10 MG/ML IV BOLUS
INTRAVENOUS | Status: DC | PRN
  Administered 2017-06-29: 170 mg via INTRAVENOUS
  Administered 2017-06-29: 30 mg via INTRAVENOUS

## 2017-06-29 MED ORDER — PRAVASTATIN SODIUM 40 MG PO TABS
80.0000 mg | ORAL_TABLET | Freq: Every day | ORAL | Status: DC
  Administered 2017-06-29: 80 mg via ORAL
  Filled 2017-06-29: qty 2

## 2017-06-29 MED ORDER — PHENYLEPHRINE HCL 10 MG/ML IJ SOLN
INTRAMUSCULAR | Status: DC | PRN
  Administered 2017-06-29: 80 ug via INTRAVENOUS

## 2017-06-29 MED ORDER — CEFAZOLIN SODIUM-DEXTROSE 1-4 GM/50ML-% IV SOLN
1.0000 g | INTRAVENOUS | Status: AC
  Administered 2017-06-29: 1 g via INTRAVENOUS
  Filled 2017-06-29: qty 50

## 2017-06-29 MED ORDER — ONDANSETRON HCL 4 MG PO TABS: 4.0000 mg | ORAL_TABLET | Freq: Four times a day (QID) | ORAL | Status: DC | PRN

## 2017-06-29 MED ORDER — MIDAZOLAM HCL 2 MG/2ML IJ SOLN
INTRAMUSCULAR | Status: AC
  Filled 2017-06-29: qty 2

## 2017-06-29 MED ORDER — HYDROCODONE-ACETAMINOPHEN 10-325 MG PO TABS: 1.0000 | ORAL_TABLET | ORAL | Status: DC | PRN

## 2017-06-29 MED ORDER — NITROGLYCERIN 0.4 MG SL SUBL: 0.4000 mg | SUBLINGUAL_TABLET | SUBLINGUAL | Status: DC | PRN

## 2017-06-29 MED ORDER — CARVEDILOL 3.125 MG PO TABS
3.1250 mg | ORAL_TABLET | Freq: Two times a day (BID) | ORAL | Status: DC
  Administered 2017-06-29: 3.125 mg via ORAL
  Filled 2017-06-29: qty 1

## 2017-06-29 MED ORDER — VITAMIN D 1000 UNITS PO TABS
2000.0000 [IU] | ORAL_TABLET | Freq: Every day | ORAL | Status: DC
  Administered 2017-06-29: 2000 [IU] via ORAL
  Filled 2017-06-29: qty 2

## 2017-06-29 MED ORDER — HYDROMORPHONE HCL 2 MG/ML IJ SOLN
0.2500 mg | INTRAMUSCULAR | Status: DC | PRN
  Administered 2017-06-29 (×4): 0.5 mg via INTRAVENOUS

## 2017-06-29 MED ORDER — MAGNESIUM OXIDE 400 (241.3 MG) MG PO TABS
400.0000 mg | ORAL_TABLET | Freq: Every day | ORAL | Status: DC
  Administered 2017-06-29: 400 mg via ORAL
  Filled 2017-06-29: qty 1

## 2017-06-29 MED ORDER — SUCCINYLCHOLINE CHLORIDE 200 MG/10ML IV SOSY
PREFILLED_SYRINGE | INTRAVENOUS | Status: AC
  Filled 2017-06-29: qty 10

## 2017-06-29 MED ORDER — SODIUM CHLORIDE 0.9 % IJ SOLN
INTRAMUSCULAR | Status: AC
  Filled 2017-06-29: qty 10

## 2017-06-29 MED ORDER — MORPHINE SULFATE ER 15 MG PO TBCR
60.0000 mg | EXTENDED_RELEASE_TABLET | Freq: Three times a day (TID) | ORAL | Status: DC
Start: 2017-06-29 — End: 2017-06-29
  Administered 2017-06-29 (×2): 60 mg via ORAL
  Filled 2017-06-29 (×2): qty 4

## 2017-06-29 MED ORDER — DEXAMETHASONE SODIUM PHOSPHATE 10 MG/ML IJ SOLN
INTRAMUSCULAR | Status: AC
  Filled 2017-06-29: qty 1

## 2017-06-29 MED ORDER — SUCCINYLCHOLINE CHLORIDE 20 MG/ML IJ SOLN
INTRAMUSCULAR | Status: DC | PRN
  Administered 2017-06-29: 120 mg via INTRAVENOUS

## 2017-06-29 MED ORDER — LIDOCAINE 2% (20 MG/ML) 5 ML SYRINGE
INTRAMUSCULAR | Status: AC
  Filled 2017-06-29: qty 5

## 2017-06-29 MED ORDER — ONDANSETRON HCL 4 MG/2ML IJ SOLN
INTRAMUSCULAR | Status: DC | PRN
  Administered 2017-06-29: 4 mg via INTRAVENOUS

## 2017-06-29 MED ORDER — PROMETHAZINE HCL 25 MG/ML IJ SOLN: 6.2500 mg | INTRAMUSCULAR | Status: DC | PRN

## 2017-06-29 MED ORDER — KCL IN DEXTROSE-NACL 20-5-0.45 MEQ/L-%-% IV SOLN
INTRAVENOUS | Status: AC
  Filled 2017-06-29: qty 1000

## 2017-06-29 MED ORDER — HYDROMORPHONE HCL 2 MG/ML IJ SOLN: 0.5000 mg | INTRAMUSCULAR | Status: DC | PRN

## 2017-06-29 MED ORDER — DEXAMETHASONE SODIUM PHOSPHATE 10 MG/ML IJ SOLN
INTRAMUSCULAR | Status: DC | PRN
  Administered 2017-06-29: 10 mg via INTRAVENOUS

## 2017-06-29 MED ORDER — LACTATED RINGERS IV SOLN
INTRAVENOUS | Status: DC | PRN
  Administered 2017-06-29 (×2): via INTRAVENOUS

## 2017-06-29 MED ORDER — FENTANYL CITRATE (PF) 100 MCG/2ML IJ SOLN: 50.0000 ug | Freq: Once | INTRAMUSCULAR | Status: DC

## 2017-06-29 MED ORDER — ONDANSETRON HCL 4 MG/2ML IJ SOLN: 4.0000 mg | Freq: Four times a day (QID) | INTRAMUSCULAR | Status: DC | PRN

## 2017-06-29 SURGICAL SUPPLY — 59 items
BANDAGE ACE 3X5.8 VEL STRL LF (GAUZE/BANDAGES/DRESSINGS) ×3 IMPLANT
BANDAGE ACE 4X5 VEL STRL LF (GAUZE/BANDAGES/DRESSINGS) ×3 IMPLANT
BNDG CMPR 9X4 STRL LF SNTH (GAUZE/BANDAGES/DRESSINGS) ×1
BNDG COHESIVE 1X5 TAN STRL LF (GAUZE/BANDAGES/DRESSINGS) IMPLANT
BNDG CONFORM 2 STRL LF (GAUZE/BANDAGES/DRESSINGS) IMPLANT
BNDG ESMARK 4X9 LF (GAUZE/BANDAGES/DRESSINGS) ×3 IMPLANT
BNDG GAUZE ELAST 4 BULKY (GAUZE/BANDAGES/DRESSINGS) ×3 IMPLANT
CORDS BIPOLAR (ELECTRODE) ×3 IMPLANT
COVER SURGICAL LIGHT HANDLE (MISCELLANEOUS) ×3 IMPLANT
CUFF TOURNIQUET SINGLE 18IN (TOURNIQUET CUFF) ×3 IMPLANT
CUFF TOURNIQUET SINGLE 24IN (TOURNIQUET CUFF) IMPLANT
DRAIN PENROSE 1/4X12 LTX STRL (WOUND CARE) IMPLANT
DRAPE SURG 17X23 STRL (DRAPES) ×3 IMPLANT
DRSG ADAPTIC 3X8 NADH LF (GAUZE/BANDAGES/DRESSINGS) ×3 IMPLANT
ELECT REM PT RETURN 9FT ADLT (ELECTROSURGICAL)
ELECTRODE REM PT RTRN 9FT ADLT (ELECTROSURGICAL) IMPLANT
GAUZE SPONGE 4X4 12PLY STRL (GAUZE/BANDAGES/DRESSINGS) ×3 IMPLANT
GAUZE XEROFORM 1X8 LF (GAUZE/BANDAGES/DRESSINGS) ×3 IMPLANT
GAUZE XEROFORM 5X9 LF (GAUZE/BANDAGES/DRESSINGS) IMPLANT
GLOVE BIOGEL PI IND STRL 8.5 (GLOVE) ×1 IMPLANT
GLOVE BIOGEL PI INDICATOR 8.5 (GLOVE) ×2
GLOVE SURG ORTHO 8.0 STRL STRW (GLOVE) ×3 IMPLANT
GOWN STRL REUS W/ TWL LRG LVL3 (GOWN DISPOSABLE) ×3 IMPLANT
GOWN STRL REUS W/ TWL XL LVL3 (GOWN DISPOSABLE) ×1 IMPLANT
GOWN STRL REUS W/TWL LRG LVL3 (GOWN DISPOSABLE) ×9
GOWN STRL REUS W/TWL XL LVL3 (GOWN DISPOSABLE) ×3
HANDPIECE INTERPULSE COAX TIP (DISPOSABLE)
KIT BASIN OR (CUSTOM PROCEDURE TRAY) ×3 IMPLANT
KIT TURNOVER KIT B (KITS) ×3 IMPLANT
MANIFOLD NEPTUNE II (INSTRUMENTS) ×3 IMPLANT
NDL HYPO 25GX1X1/2 BEV (NEEDLE) IMPLANT
NEEDLE HYPO 25GX1X1/2 BEV (NEEDLE) IMPLANT
NS IRRIG 1000ML POUR BTL (IV SOLUTION) ×3 IMPLANT
PACK ORTHO EXTREMITY (CUSTOM PROCEDURE TRAY) ×3 IMPLANT
PAD ARMBOARD 7.5X6 YLW CONV (MISCELLANEOUS) ×6 IMPLANT
PAD CAST 4YDX4 CTTN HI CHSV (CAST SUPPLIES) ×1 IMPLANT
PADDING CAST COTTON 4X4 STRL (CAST SUPPLIES) ×3
SET CYSTO W/LG BORE CLAMP LF (SET/KITS/TRAYS/PACK) IMPLANT
SET HNDPC FAN SPRY TIP SCT (DISPOSABLE) IMPLANT
SOAP 2 % CHG 4 OZ (WOUND CARE) ×3 IMPLANT
SPONGE LAP 18X18 X RAY DECT (DISPOSABLE) ×3 IMPLANT
SPONGE LAP 4X18 X RAY DECT (DISPOSABLE) ×3 IMPLANT
SUCTION FRAZIER HANDLE 10FR (MISCELLANEOUS) ×2
SUCTION TUBE FRAZIER 10FR DISP (MISCELLANEOUS) ×1 IMPLANT
SUT ETHILON 4 0 PS 2 18 (SUTURE) IMPLANT
SUT ETHILON 5 0 P 3 18 (SUTURE)
SUT NYLON ETHILON 5-0 P-3 1X18 (SUTURE) IMPLANT
SUT PROLENE 3 0 PS 2 (SUTURE) ×4 IMPLANT
SUT PROLENE 4 0 PS 2 18 (SUTURE) ×4 IMPLANT
SWAB COLLECTION DEVICE MRSA (MISCELLANEOUS) ×3 IMPLANT
SWAB CULTURE ESWAB REG 1ML (MISCELLANEOUS) IMPLANT
SYR CONTROL 10ML LL (SYRINGE) IMPLANT
TOWEL OR 17X24 6PK STRL BLUE (TOWEL DISPOSABLE) ×3 IMPLANT
TOWEL OR 17X26 10 PK STRL BLUE (TOWEL DISPOSABLE) ×3 IMPLANT
TUBE CONNECTING 12'X1/4 (SUCTIONS) ×1
TUBE CONNECTING 12X1/4 (SUCTIONS) ×2 IMPLANT
UNDERPAD 30X30 (UNDERPADS AND DIAPERS) ×3 IMPLANT
WATER STERILE IRR 1000ML POUR (IV SOLUTION) ×3 IMPLANT
YANKAUER SUCT BULB TIP NO VENT (SUCTIONS) ×3 IMPLANT

## 2017-06-29 NOTE — Anesthesia Postprocedure Evaluation (Signed)
Anesthesia Post Note  Patient: Daniel Rivers  Procedure(s) Performed: IRRIGATION AND DEBRIDEMENT EXTREMITY, LEFT FIFTH DIGIT AMPUTATION (Left Hand)     Patient location during evaluation: PACU Anesthesia Type: General Level of consciousness: awake and alert Pain management: pain level controlled Vital Signs Assessment: post-procedure vital signs reviewed and stable Respiratory status: spontaneous breathing, nonlabored ventilation, respiratory function stable and patient connected to nasal cannula oxygen Cardiovascular status: blood pressure returned to baseline and stable Postop Assessment: no apparent nausea or vomiting Anesthetic complications: no    Last Vitals:  Vitals:   06/29/17 0200 06/29/17 0205  BP: 121/77   Pulse: 91 87  Resp: 12 12  Temp:    SpO2: 100% 99%    Last Pain:  Vitals:   06/29/17 0205  TempSrc:   PainSc: 8                  Jaiveer Panas S
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
## 2017-06-29 NOTE — Transfer of Care (Signed)
Immediate Anesthesia Transfer of Care Note  Patient: Daniel Rivers  Procedure(s) Performed: IRRIGATION AND DEBRIDEMENT EXTREMITY, LEFT FIFTH DIGIT AMPUTATION (Left Hand)  Patient Location: PACU  Anesthesia Type:General  Level of Consciousness: awake, oriented, drowsy and patient cooperative  Airway & Oxygen Therapy: Patient Spontanous Breathing and Patient connected to nasal cannula oxygen  Post-op Assessment: Report given to RN, Post -op Vital signs reviewed and stable and Patient moving all extremities X 4  Post vital signs: Reviewed and stable  Last Vitals:  Vitals Value Taken Time  BP 118/81 06/29/2017  1:43 AM  Temp 36.5 C 06/29/2017  1:43 AM  Pulse 98 06/29/2017  1:43 AM  Resp 11 06/29/2017  1:43 AM  SpO2 98 % 06/29/2017  1:43 AM    Last Pain:  Vitals:   06/28/17 2316  TempSrc: Oral  PainSc:          Complications: No apparent anesthesia complications

## 2017-06-29 NOTE — Progress Notes (Signed)
Discharge instructions completed with pt.  Pt verbalized understanding of the information.  Pt alert and oriented x4.  Pt denies chest pain, shortness of breath, dizziness, lightheadedness, and n/v.  Pt discharged home.

## 2017-06-29 NOTE — Progress Notes (Signed)
D; asked son to take wallet, pt agreed. Room number given to son.

## 2017-06-29 NOTE — Anesthesia Preprocedure Evaluation (Addendum)
Anesthesia Evaluation  Patient identified by MRN, date of birth, ID band Patient awake    Reviewed: Allergy & Precautions, NPO status , Patient's Chart, lab work & pertinent test results  Airway Mallampati: II  TM Distance: <3 FB Neck ROM: Limited    Dental no notable dental hx.    Pulmonary neg pulmonary ROS, former smoker,    Pulmonary exam normal breath sounds clear to auscultation       Cardiovascular hypertension, + CAD and + Cardiac Stents  Normal cardiovascular exam Rhythm:Regular Rate:Normal     Neuro/Psych negative neurological ROS  negative psych ROS   GI/Hepatic Neg liver ROS, GERD  ,  Endo/Other  diabetes  Renal/GU negative Renal ROS  negative genitourinary   Musculoskeletal Ankylosing spondylitis   Abdominal   Peds negative pediatric ROS (+)  Hematology negative hematology ROS (+)   Anesthesia Other Findings   Reproductive/Obstetrics negative OB ROS                            Anesthesia Physical Anesthesia Plan  ASA: III and emergent  Anesthesia Plan: General   Post-op Pain Management:    Induction: Intravenous and Rapid sequence  PONV Risk Score and Plan: 2 and Ondansetron and Treatment may vary due to age or medical condition  Airway Management Planned: Oral ETT  Additional Equipment:   Intra-op Plan:   Post-operative Plan: Extubation in OR  Informed Consent: I have reviewed the patients History and Physical, chart, labs and discussed the procedure including the risks, benefits and alternatives for the proposed anesthesia with the patient or authorized representative who has indicated his/her understanding and acceptance.   Dental advisory given  Plan Discussed with: CRNA and Surgeon  Anesthesia Plan Comments:         Anesthesia Quick Evaluation

## 2017-06-29 NOTE — Discharge Instructions (Signed)
KEEP BANDAGE CLEAN AND DRY CALL OFFICE FOR F/U APPT [phone] KEEP HAND ELEVATED ABOVE HEART OK TO APPLY ICE TO OPERATIVE AREA CONTACT OFFICE IF ANY WORSENING PAIN OR CONCERNS.  Follow up in 7 days for post-operative visit with Dr Iran Planas.

## 2017-06-29 NOTE — Anesthesia Procedure Notes (Signed)
Procedure Name: Intubation Date/Time: 06/29/2017 12:53 AM Performed by: Claris Che, CRNA Pre-anesthesia Checklist: Patient identified, Emergency Drugs available, Suction available, Patient being monitored and Timeout performed Patient Re-evaluated:Patient Re-evaluated prior to induction Oxygen Delivery Method: Circle system utilized Preoxygenation: Pre-oxygenation with 100% oxygen Induction Type: IV induction, Rapid sequence and Cricoid Pressure applied Laryngoscope Size: Mac and 3 Grade View: Grade II Tube type: Oral Tube size: 8.0 mm Number of attempts: 1 Airway Equipment and Method: Stylet Placement Confirmation: ETT inserted through vocal cords under direct vision,  positive ETCO2 and breath sounds checked- equal and bilateral Secured at: 24 cm Tube secured with: Tape Dental Injury: Teeth and Oropharynx as per pre-operative assessment

## 2017-06-30 ENCOUNTER — Encounter (HOSPITAL_COMMUNITY): Payer: Self-pay | Admitting: Orthopedic Surgery

## 2017-07-01 DIAGNOSIS — E119 Type 2 diabetes mellitus without complications: Secondary | ICD-10-CM | POA: Diagnosis not present

## 2017-07-01 DIAGNOSIS — H2513 Age-related nuclear cataract, bilateral: Secondary | ICD-10-CM | POA: Diagnosis not present

## 2017-07-01 DIAGNOSIS — H353111 Nonexudative age-related macular degeneration, right eye, early dry stage: Secondary | ICD-10-CM | POA: Diagnosis not present

## 2017-07-01 DIAGNOSIS — H40013 Open angle with borderline findings, low risk, bilateral: Secondary | ICD-10-CM | POA: Diagnosis not present

## 2017-07-06 DIAGNOSIS — Z4789 Encounter for other orthopedic aftercare: Secondary | ICD-10-CM | POA: Diagnosis not present

## 2017-07-06 DIAGNOSIS — M79645 Pain in left finger(s): Secondary | ICD-10-CM | POA: Diagnosis not present

## 2017-07-09 DIAGNOSIS — E1165 Type 2 diabetes mellitus with hyperglycemia: Secondary | ICD-10-CM | POA: Diagnosis not present

## 2017-07-09 DIAGNOSIS — E7800 Pure hypercholesterolemia, unspecified: Secondary | ICD-10-CM | POA: Diagnosis not present

## 2017-07-14 DIAGNOSIS — I10 Essential (primary) hypertension: Secondary | ICD-10-CM | POA: Diagnosis not present

## 2017-07-14 DIAGNOSIS — E1165 Type 2 diabetes mellitus with hyperglycemia: Secondary | ICD-10-CM | POA: Diagnosis not present

## 2017-07-14 DIAGNOSIS — Z125 Encounter for screening for malignant neoplasm of prostate: Secondary | ICD-10-CM | POA: Diagnosis not present

## 2017-07-14 DIAGNOSIS — E7800 Pure hypercholesterolemia, unspecified: Secondary | ICD-10-CM | POA: Diagnosis not present

## 2017-07-14 DIAGNOSIS — R945 Abnormal results of liver function studies: Secondary | ICD-10-CM | POA: Diagnosis not present

## 2017-07-14 DIAGNOSIS — S6982XD Other specified injuries of left wrist, hand and finger(s), subsequent encounter: Secondary | ICD-10-CM | POA: Diagnosis not present

## 2017-08-02 ENCOUNTER — Encounter: Payer: Medicare Other | Admitting: Registered Nurse

## 2017-08-02 ENCOUNTER — Encounter: Payer: Medicare Other | Attending: Physical Medicine & Rehabilitation | Admitting: Registered Nurse

## 2017-08-02 VITALS — BP 144/89 | HR 83 | Ht 76.0 in | Wt 253.0 lb

## 2017-08-02 DIAGNOSIS — Z5181 Encounter for therapeutic drug level monitoring: Secondary | ICD-10-CM

## 2017-08-02 DIAGNOSIS — F329 Major depressive disorder, single episode, unspecified: Secondary | ICD-10-CM | POA: Insufficient documentation

## 2017-08-02 DIAGNOSIS — Z87891 Personal history of nicotine dependence: Secondary | ICD-10-CM | POA: Diagnosis not present

## 2017-08-02 DIAGNOSIS — M5412 Radiculopathy, cervical region: Secondary | ICD-10-CM

## 2017-08-02 DIAGNOSIS — M7522 Bicipital tendinitis, left shoulder: Secondary | ICD-10-CM | POA: Diagnosis not present

## 2017-08-02 DIAGNOSIS — M7062 Trochanteric bursitis, left hip: Secondary | ICD-10-CM

## 2017-08-02 DIAGNOSIS — E7800 Pure hypercholesterolemia, unspecified: Secondary | ICD-10-CM | POA: Diagnosis not present

## 2017-08-02 DIAGNOSIS — K449 Diaphragmatic hernia without obstruction or gangrene: Secondary | ICD-10-CM | POA: Diagnosis not present

## 2017-08-02 DIAGNOSIS — G894 Chronic pain syndrome: Secondary | ICD-10-CM

## 2017-08-02 DIAGNOSIS — K219 Gastro-esophageal reflux disease without esophagitis: Secondary | ICD-10-CM | POA: Insufficient documentation

## 2017-08-02 DIAGNOSIS — E119 Type 2 diabetes mellitus without complications: Secondary | ICD-10-CM | POA: Insufficient documentation

## 2017-08-02 DIAGNOSIS — G8929 Other chronic pain: Secondary | ICD-10-CM | POA: Diagnosis not present

## 2017-08-02 DIAGNOSIS — I10 Essential (primary) hypertension: Secondary | ICD-10-CM | POA: Diagnosis not present

## 2017-08-02 DIAGNOSIS — M452 Ankylosing spondylitis of cervical region: Secondary | ICD-10-CM | POA: Diagnosis not present

## 2017-08-02 DIAGNOSIS — M109 Gout, unspecified: Secondary | ICD-10-CM | POA: Insufficient documentation

## 2017-08-02 DIAGNOSIS — M459 Ankylosing spondylitis of unspecified sites in spine: Secondary | ICD-10-CM

## 2017-08-02 DIAGNOSIS — F419 Anxiety disorder, unspecified: Secondary | ICD-10-CM | POA: Insufficient documentation

## 2017-08-02 DIAGNOSIS — E669 Obesity, unspecified: Secondary | ICD-10-CM | POA: Insufficient documentation

## 2017-08-02 DIAGNOSIS — M542 Cervicalgia: Secondary | ICD-10-CM

## 2017-08-02 DIAGNOSIS — M545 Low back pain: Secondary | ICD-10-CM

## 2017-08-02 DIAGNOSIS — Z79899 Other long term (current) drug therapy: Secondary | ICD-10-CM

## 2017-08-02 DIAGNOSIS — I2510 Atherosclerotic heart disease of native coronary artery without angina pectoris: Secondary | ICD-10-CM | POA: Diagnosis not present

## 2017-08-02 DIAGNOSIS — Z955 Presence of coronary angioplasty implant and graft: Secondary | ICD-10-CM | POA: Diagnosis not present

## 2017-08-02 DIAGNOSIS — M456 Ankylosing spondylitis lumbar region: Secondary | ICD-10-CM | POA: Insufficient documentation

## 2017-08-02 DIAGNOSIS — M455 Ankylosing spondylitis of thoracolumbar region: Secondary | ICD-10-CM | POA: Diagnosis not present

## 2017-08-02 DIAGNOSIS — N400 Enlarged prostate without lower urinary tract symptoms: Secondary | ICD-10-CM | POA: Insufficient documentation

## 2017-08-02 DIAGNOSIS — M7061 Trochanteric bursitis, right hip: Secondary | ICD-10-CM | POA: Diagnosis not present

## 2017-08-02 DIAGNOSIS — M1990 Unspecified osteoarthritis, unspecified site: Secondary | ICD-10-CM | POA: Insufficient documentation

## 2017-08-02 MED ORDER — MORPHINE SULFATE ER 60 MG PO TBCR: 60.0000 mg | EXTENDED_RELEASE_TABLET | Freq: Three times a day (TID) | ORAL | 0 refills | Status: DC

## 2017-08-02 MED ORDER — OXYCODONE HCL 5 MG PO TABS: 5.0000 mg | ORAL_TABLET | Freq: Four times a day (QID) | ORAL | 0 refills | Status: DC | PRN

## 2017-08-02 MED ORDER — MORPHINE SULFATE ER 60 MG PO TBCR
60.0000 mg | EXTENDED_RELEASE_TABLET | Freq: Three times a day (TID) | ORAL | 0 refills | Status: DC
Start: 2017-08-02 — End: 2017-10-25
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
## 2017-08-03 ENCOUNTER — Encounter: Payer: Self-pay | Admitting: Registered Nurse

## 2017-08-10 DIAGNOSIS — I2510 Atherosclerotic heart disease of native coronary artery without angina pectoris: Secondary | ICD-10-CM | POA: Diagnosis not present

## 2017-08-10 DIAGNOSIS — Z9861 Coronary angioplasty status: Secondary | ICD-10-CM | POA: Diagnosis not present

## 2017-08-10 DIAGNOSIS — E119 Type 2 diabetes mellitus without complications: Secondary | ICD-10-CM | POA: Diagnosis not present

## 2017-08-10 DIAGNOSIS — M450 Ankylosing spondylitis of multiple sites in spine: Secondary | ICD-10-CM | POA: Diagnosis not present

## 2017-09-16 ENCOUNTER — Inpatient Hospital Stay: Payer: Medicare Other | Attending: Oncology

## 2017-09-16 DIAGNOSIS — Z862 Personal history of diseases of the blood and blood-forming organs and certain disorders involving the immune mechanism: Secondary | ICD-10-CM

## 2017-09-16 DIAGNOSIS — D6959 Other secondary thrombocytopenia: Secondary | ICD-10-CM | POA: Diagnosis not present

## 2017-09-16 LAB — CMP (CANCER CENTER ONLY)
ALBUMIN: 3.8 g/dL (ref 3.5–5.0)
ALK PHOS: 75 U/L (ref 38–126)
ALT: 36 U/L (ref 0–44)
AST: 41 U/L (ref 15–41)
Anion gap: 9 (ref 5–15)
BILIRUBIN TOTAL: 1.3 mg/dL — AB (ref 0.3–1.2)
BUN: 15 mg/dL (ref 8–23)
CALCIUM: 9.4 mg/dL (ref 8.9–10.3)
CO2: 29 mmol/L (ref 22–32)
CREATININE: 0.89 mg/dL (ref 0.61–1.24)
Chloride: 101 mmol/L (ref 98–111)
GFR, Est AFR Am: >60 mL/min (ref >60)
GFR, Estimated: >60 mL/min (ref >60)
GLUCOSE: 127 mg/dL — AB (ref 70–99)
POTASSIUM: 4.1 mmol/L (ref 3.5–5.1)
Sodium: 139 mmol/L (ref 135–145)
Total Protein: 7.8 g/dL (ref 6.5–8.1)

## 2017-09-16 LAB — CBC WITH DIFFERENTIAL (CANCER CENTER ONLY)
BASOS ABS: 0 10*3/uL (ref 0.0–0.1)
Basophils Relative: 0 %
EOS PCT: 2 %
Eosinophils Absolute: 0.1 10*3/uL (ref 0.0–0.5)
HCT: 41.4 % (ref 38.4–49.9)
Hemoglobin: 13.8 g/dL (ref 13.0–17.1)
LYMPHS PCT: 28 %
Lymphs Abs: 1.6 10*3/uL (ref 0.9–3.3)
MCH: 29.6 pg (ref 27.2–33.4)
MCHC: 33.3 g/dL (ref 32.0–36.0)
MCV: 88.8 fL (ref 79.3–98.0)
MONO ABS: 0.6 10*3/uL (ref 0.1–0.9)
Monocytes Relative: 11 %
Neutro Abs: 3.2 10*3/uL (ref 1.5–6.5)
Neutrophils Relative %: 59 %
PLATELETS: 132 10*3/uL — AB (ref 140–400)
RBC: 4.66 MIL/uL (ref 4.20–5.82)
RDW: 13.7 % (ref 11.0–14.6)
WBC Count: 5.5 10*3/uL (ref 4.0–10.3)

## 2017-09-16 LAB — IRON AND TIBC
Iron: 60 ug/dL (ref 42–163)
Saturation Ratios: 17 % — ABNORMAL LOW (ref 42–163)
TIBC: 359 ug/dL (ref 202–409)
UIBC: 299 ug/dL

## 2017-09-16 LAB — FERRITIN: Ferritin: 88 ng/mL (ref 24–336)

## 2017-09-23 ENCOUNTER — Inpatient Hospital Stay: Payer: Medicare Other | Admitting: Oncology

## 2017-09-23 ENCOUNTER — Inpatient Hospital Stay: Payer: Medicare Other

## 2017-09-24 ENCOUNTER — Telehealth: Payer: Self-pay | Admitting: Oncology

## 2017-09-24 NOTE — Telephone Encounter (Signed)
Appts r/s patient missed previous appt on 8/22/ per voicemail from patient 8/23

## 2017-09-30 ENCOUNTER — Inpatient Hospital Stay: Payer: Medicare Other

## 2017-09-30 ENCOUNTER — Telehealth: Payer: Self-pay | Admitting: Oncology

## 2017-09-30 ENCOUNTER — Inpatient Hospital Stay (HOSPITAL_BASED_OUTPATIENT_CLINIC_OR_DEPARTMENT_OTHER): Payer: Medicare Other | Admitting: Oncology

## 2017-09-30 VITALS — BP 114/67 | HR 84 | Temp 98.3°F | Resp 18 | Ht 76.0 in | Wt 256.6 lb

## 2017-09-30 VITALS — BP 106/70 | HR 82 | Temp 98.2°F | Resp 16

## 2017-09-30 DIAGNOSIS — D6959 Other secondary thrombocytopenia: Secondary | ICD-10-CM | POA: Diagnosis not present

## 2017-09-30 DIAGNOSIS — Z862 Personal history of diseases of the blood and blood-forming organs and certain disorders involving the immune mechanism: Secondary | ICD-10-CM

## 2017-09-30 NOTE — Telephone Encounter (Signed)
Pt declined avs and calendar  °

## 2017-09-30 NOTE — Progress Notes (Signed)
  Per Dr. Alen Blew ok to do phlebotomy today with previous lab vales 09/16/17 Hct. 41.4, Ferritin 88, Hemoglobin 13.8.  Daniel Rivers presents today for phlebotomy per MD orders. Phlebotomy procedure started at 1105and ended at 1115 511 grams removed. Patient observed for 30 minutes after procedure without any incident. Patient tolerated procedure well. IV needle removed intact.
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
## 2017-10-01 DIAGNOSIS — E119 Type 2 diabetes mellitus without complications: Secondary | ICD-10-CM | POA: Diagnosis not present

## 2017-10-01 DIAGNOSIS — H2513 Age-related nuclear cataract, bilateral: Secondary | ICD-10-CM | POA: Diagnosis not present

## 2017-10-01 DIAGNOSIS — H40013 Open angle with borderline findings, low risk, bilateral: Secondary | ICD-10-CM | POA: Diagnosis not present

## 2017-10-01 DIAGNOSIS — H353111 Nonexudative age-related macular degeneration, right eye, early dry stage: Secondary | ICD-10-CM | POA: Diagnosis not present

## 2017-10-05 ENCOUNTER — Other Ambulatory Visit: Payer: Self-pay

## 2017-10-05 ENCOUNTER — Inpatient Hospital Stay (HOSPITAL_COMMUNITY)
Admission: EM | Admit: 2017-10-05 | Discharge: 2017-11-02 | DRG: 246 | Disposition: E | Payer: Medicare Other | Attending: Internal Medicine | Admitting: Internal Medicine

## 2017-10-05 ENCOUNTER — Inpatient Hospital Stay (HOSPITAL_COMMUNITY): Payer: Medicare Other

## 2017-10-05 ENCOUNTER — Emergency Department (HOSPITAL_COMMUNITY): Payer: Medicare Other

## 2017-10-05 ENCOUNTER — Inpatient Hospital Stay (HOSPITAL_COMMUNITY): Admission: EM | Disposition: E | Payer: Self-pay | Source: Home / Self Care | Attending: Internal Medicine

## 2017-10-05 ENCOUNTER — Encounter (HOSPITAL_COMMUNITY): Payer: Self-pay | Admitting: *Deleted

## 2017-10-05 DIAGNOSIS — G894 Chronic pain syndrome: Secondary | ICD-10-CM | POA: Diagnosis present

## 2017-10-05 DIAGNOSIS — Z515 Encounter for palliative care: Secondary | ICD-10-CM | POA: Diagnosis not present

## 2017-10-05 DIAGNOSIS — E860 Dehydration: Secondary | ICD-10-CM | POA: Diagnosis not present

## 2017-10-05 DIAGNOSIS — Z0189 Encounter for other specified special examinations: Secondary | ICD-10-CM

## 2017-10-05 DIAGNOSIS — F419 Anxiety disorder, unspecified: Secondary | ICD-10-CM | POA: Diagnosis present

## 2017-10-05 DIAGNOSIS — Z886 Allergy status to analgesic agent status: Secondary | ICD-10-CM

## 2017-10-05 DIAGNOSIS — Z7984 Long term (current) use of oral hypoglycemic drugs: Secondary | ICD-10-CM

## 2017-10-05 DIAGNOSIS — Y831 Surgical operation with implant of artificial internal device as the cause of abnormal reaction of the patient, or of later complication, without mention of misadventure at the time of the procedure: Secondary | ICD-10-CM | POA: Diagnosis present

## 2017-10-05 DIAGNOSIS — I2109 ST elevation (STEMI) myocardial infarction involving other coronary artery of anterior wall: Secondary | ICD-10-CM | POA: Diagnosis present

## 2017-10-05 DIAGNOSIS — Q6101 Congenital single renal cyst: Secondary | ICD-10-CM

## 2017-10-05 DIAGNOSIS — E871 Hypo-osmolality and hyponatremia: Secondary | ICD-10-CM | POA: Diagnosis not present

## 2017-10-05 DIAGNOSIS — R1084 Generalized abdominal pain: Secondary | ICD-10-CM | POA: Diagnosis not present

## 2017-10-05 DIAGNOSIS — I213 ST elevation (STEMI) myocardial infarction of unspecified site: Secondary | ICD-10-CM | POA: Diagnosis not present

## 2017-10-05 DIAGNOSIS — E119 Type 2 diabetes mellitus without complications: Secondary | ICD-10-CM | POA: Diagnosis present

## 2017-10-05 DIAGNOSIS — N401 Enlarged prostate with lower urinary tract symptoms: Secondary | ICD-10-CM | POA: Diagnosis present

## 2017-10-05 DIAGNOSIS — T17908A Unspecified foreign body in respiratory tract, part unspecified causing other injury, initial encounter: Secondary | ICD-10-CM

## 2017-10-05 DIAGNOSIS — K5903 Drug induced constipation: Secondary | ICD-10-CM | POA: Diagnosis present

## 2017-10-05 DIAGNOSIS — I5020 Unspecified systolic (congestive) heart failure: Secondary | ICD-10-CM | POA: Diagnosis present

## 2017-10-05 DIAGNOSIS — R1907 Generalized intra-abdominal and pelvic swelling, mass and lump: Secondary | ICD-10-CM | POA: Diagnosis not present

## 2017-10-05 DIAGNOSIS — Z79899 Other long term (current) drug therapy: Secondary | ICD-10-CM

## 2017-10-05 DIAGNOSIS — K7460 Unspecified cirrhosis of liver: Secondary | ICD-10-CM

## 2017-10-05 DIAGNOSIS — E43 Unspecified severe protein-calorie malnutrition: Secondary | ICD-10-CM | POA: Diagnosis not present

## 2017-10-05 DIAGNOSIS — T82855A Stenosis of coronary artery stent, initial encounter: Secondary | ICD-10-CM | POA: Diagnosis not present

## 2017-10-05 DIAGNOSIS — T40605D Adverse effect of unspecified narcotics, subsequent encounter: Secondary | ICD-10-CM

## 2017-10-05 DIAGNOSIS — I959 Hypotension, unspecified: Secondary | ICD-10-CM | POA: Diagnosis not present

## 2017-10-05 DIAGNOSIS — Z66 Do not resuscitate: Secondary | ICD-10-CM | POA: Diagnosis not present

## 2017-10-05 DIAGNOSIS — K7469 Other cirrhosis of liver: Secondary | ICD-10-CM | POA: Diagnosis not present

## 2017-10-05 DIAGNOSIS — K7689 Other specified diseases of liver: Secondary | ICD-10-CM | POA: Diagnosis not present

## 2017-10-05 DIAGNOSIS — I219 Acute myocardial infarction, unspecified: Secondary | ICD-10-CM | POA: Diagnosis not present

## 2017-10-05 DIAGNOSIS — M450 Ankylosing spondylitis of multiple sites in spine: Secondary | ICD-10-CM | POA: Diagnosis present

## 2017-10-05 DIAGNOSIS — I2510 Atherosclerotic heart disease of native coronary artery without angina pectoris: Secondary | ICD-10-CM | POA: Diagnosis present

## 2017-10-05 DIAGNOSIS — Z87891 Personal history of nicotine dependence: Secondary | ICD-10-CM

## 2017-10-05 DIAGNOSIS — Z79891 Long term (current) use of opiate analgesic: Secondary | ICD-10-CM

## 2017-10-05 DIAGNOSIS — R846 Abnormal cytological findings in specimens from respiratory organs and thorax: Secondary | ICD-10-CM | POA: Diagnosis not present

## 2017-10-05 DIAGNOSIS — R0602 Shortness of breath: Secondary | ICD-10-CM

## 2017-10-05 DIAGNOSIS — I110 Hypertensive heart disease with heart failure: Secondary | ICD-10-CM | POA: Diagnosis present

## 2017-10-05 DIAGNOSIS — J90 Pleural effusion, not elsewhere classified: Secondary | ICD-10-CM

## 2017-10-05 DIAGNOSIS — I2102 ST elevation (STEMI) myocardial infarction involving left anterior descending coronary artery: Secondary | ICD-10-CM | POA: Diagnosis not present

## 2017-10-05 DIAGNOSIS — Z955 Presence of coronary angioplasty implant and graft: Secondary | ICD-10-CM

## 2017-10-05 DIAGNOSIS — N179 Acute kidney failure, unspecified: Secondary | ICD-10-CM | POA: Diagnosis not present

## 2017-10-05 DIAGNOSIS — Z880 Allergy status to penicillin: Secondary | ICD-10-CM

## 2017-10-05 DIAGNOSIS — R140 Abdominal distension (gaseous): Secondary | ICD-10-CM

## 2017-10-05 DIAGNOSIS — Z9049 Acquired absence of other specified parts of digestive tract: Secondary | ICD-10-CM

## 2017-10-05 DIAGNOSIS — E875 Hyperkalemia: Secondary | ICD-10-CM | POA: Diagnosis not present

## 2017-10-05 DIAGNOSIS — Z7982 Long term (current) use of aspirin: Secondary | ICD-10-CM

## 2017-10-05 DIAGNOSIS — E440 Moderate protein-calorie malnutrition: Secondary | ICD-10-CM

## 2017-10-05 DIAGNOSIS — R338 Other retention of urine: Secondary | ICD-10-CM | POA: Diagnosis present

## 2017-10-05 DIAGNOSIS — R0789 Other chest pain: Secondary | ICD-10-CM | POA: Diagnosis not present

## 2017-10-05 DIAGNOSIS — Z6830 Body mass index (BMI) 30.0-30.9, adult: Secondary | ICD-10-CM

## 2017-10-05 DIAGNOSIS — E878 Other disorders of electrolyte and fluid balance, not elsewhere classified: Secondary | ICD-10-CM | POA: Diagnosis not present

## 2017-10-05 DIAGNOSIS — R627 Adult failure to thrive: Secondary | ICD-10-CM | POA: Diagnosis present

## 2017-10-05 DIAGNOSIS — R1010 Upper abdominal pain, unspecified: Secondary | ICD-10-CM | POA: Diagnosis not present

## 2017-10-05 DIAGNOSIS — R933 Abnormal findings on diagnostic imaging of other parts of digestive tract: Secondary | ICD-10-CM | POA: Diagnosis not present

## 2017-10-05 DIAGNOSIS — R918 Other nonspecific abnormal finding of lung field: Secondary | ICD-10-CM | POA: Diagnosis not present

## 2017-10-05 DIAGNOSIS — R4182 Altered mental status, unspecified: Secondary | ICD-10-CM | POA: Diagnosis not present

## 2017-10-05 DIAGNOSIS — R000 Tachycardia, unspecified: Secondary | ICD-10-CM | POA: Diagnosis not present

## 2017-10-05 DIAGNOSIS — K7581 Nonalcoholic steatohepatitis (NASH): Secondary | ICD-10-CM | POA: Diagnosis present

## 2017-10-05 DIAGNOSIS — K767 Hepatorenal syndrome: Secondary | ICD-10-CM | POA: Diagnosis not present

## 2017-10-05 DIAGNOSIS — R180 Malignant ascites: Secondary | ICD-10-CM | POA: Diagnosis present

## 2017-10-05 DIAGNOSIS — R3911 Hesitancy of micturition: Secondary | ICD-10-CM | POA: Diagnosis present

## 2017-10-05 DIAGNOSIS — J9601 Acute respiratory failure with hypoxia: Secondary | ICD-10-CM | POA: Diagnosis not present

## 2017-10-05 DIAGNOSIS — E785 Hyperlipidemia, unspecified: Secondary | ICD-10-CM | POA: Diagnosis present

## 2017-10-05 DIAGNOSIS — E872 Acidosis: Secondary | ICD-10-CM | POA: Diagnosis not present

## 2017-10-05 DIAGNOSIS — R188 Other ascites: Secondary | ICD-10-CM

## 2017-10-05 DIAGNOSIS — Z7189 Other specified counseling: Secondary | ICD-10-CM

## 2017-10-05 DIAGNOSIS — F329 Major depressive disorder, single episode, unspecified: Secondary | ICD-10-CM | POA: Diagnosis present

## 2017-10-05 DIAGNOSIS — K219 Gastro-esophageal reflux disease without esophagitis: Secondary | ICD-10-CM | POA: Diagnosis present

## 2017-10-05 DIAGNOSIS — Z4682 Encounter for fitting and adjustment of non-vascular catheter: Secondary | ICD-10-CM | POA: Diagnosis not present

## 2017-10-05 DIAGNOSIS — E1129 Type 2 diabetes mellitus with other diabetic kidney complication: Secondary | ICD-10-CM | POA: Diagnosis not present

## 2017-10-05 DIAGNOSIS — R079 Chest pain, unspecified: Secondary | ICD-10-CM | POA: Diagnosis not present

## 2017-10-05 HISTORY — PX: LEFT HEART CATH AND CORONARY ANGIOGRAPHY: CATH118249

## 2017-10-05 HISTORY — PX: CORONARY THROMBECTOMY: CATH118304

## 2017-10-05 HISTORY — PX: RIGHT HEART CATH: CATH118263

## 2017-10-05 HISTORY — PX: CORONARY STENT INTERVENTION: CATH118234

## 2017-10-05 LAB — POCT I-STAT 3, ART BLOOD GAS (G3+)
Acid-base deficit: 6 mmol/L — ABNORMAL HIGH (ref 0.0–2.0)
Bicarbonate: 19.5 mmol/L — ABNORMAL LOW (ref 20.0–28.0)
O2 SAT: 97 %
PH ART: 7.336 — AB (ref 7.350–7.450)
TCO2: 21 mmol/L — AB (ref 22–32)
pCO2 arterial: 36.4 mmHg (ref 32.0–48.0)
pO2, Arterial: 98 mmHg (ref 83.0–108.0)

## 2017-10-05 LAB — COMPREHENSIVE METABOLIC PANEL
ALT: 25 U/L (ref 0–44)
AST: 31 U/L (ref 15–41)
Albumin: 3 g/dL — ABNORMAL LOW (ref 3.5–5.0)
Alkaline Phosphatase: 47 U/L (ref 38–126)
Anion gap: 13 (ref 5–15)
BUN: 33 mg/dL — ABNORMAL HIGH (ref 8–23)
CO2: 21 mmol/L — ABNORMAL LOW (ref 22–32)
Calcium: 9 mg/dL (ref 8.9–10.3)
Chloride: 101 mmol/L (ref 98–111)
Creatinine, Ser: 1.5 mg/dL — ABNORMAL HIGH (ref 0.61–1.24)
GFR calc Af Amer: 54 mL/min — ABNORMAL LOW (ref >60)
GFR calc non Af Amer: 46 mL/min — ABNORMAL LOW (ref >60)
Glucose, Bld: 215 mg/dL — ABNORMAL HIGH (ref 70–99)
Potassium: 4.4 mmol/L (ref 3.5–5.1)
Sodium: 135 mmol/L (ref 135–145)
Total Bilirubin: 1.8 mg/dL — ABNORMAL HIGH (ref 0.3–1.2)
Total Protein: 6 g/dL — ABNORMAL LOW (ref 6.5–8.1)

## 2017-10-05 LAB — GLUCOSE, CAPILLARY
GLUCOSE-CAPILLARY: 175 mg/dL — AB (ref 70–99)
Glucose-Capillary: 156 mg/dL — ABNORMAL HIGH (ref 70–99)

## 2017-10-05 LAB — CBC WITH DIFFERENTIAL/PLATELET
Abs Immature Granulocytes: 0 10*3/uL (ref 0.0–0.1)
Basophils Absolute: 0.1 10*3/uL (ref 0.0–0.1)
Basophils Relative: 1 %
Eosinophils Absolute: 0.2 10*3/uL (ref 0.0–0.7)
Eosinophils Relative: 2 %
HCT: 42.9 % (ref 39.0–52.0)
Hemoglobin: 13.9 g/dL (ref 13.0–17.0)
Immature Granulocytes: 0 %
Lymphocytes Relative: 25 %
Lymphs Abs: 2.6 10*3/uL (ref 0.7–4.0)
MCH: 29 pg (ref 26.0–34.0)
MCHC: 32.4 g/dL (ref 30.0–36.0)
MCV: 89.4 fL (ref 78.0–100.0)
Monocytes Absolute: 1.2 10*3/uL — ABNORMAL HIGH (ref 0.1–1.0)
Monocytes Relative: 11 %
Neutro Abs: 6.2 10*3/uL (ref 1.7–7.7)
Neutrophils Relative %: 61 %
Platelets: 254 10*3/uL (ref 150–400)
RBC: 4.8 MIL/uL (ref 4.22–5.81)
RDW: 13.6 % (ref 11.5–15.5)
WBC: 10.2 10*3/uL (ref 4.0–10.5)

## 2017-10-05 LAB — MRSA PCR SCREENING: MRSA by PCR: POSITIVE — AB

## 2017-10-05 LAB — POCT I-STAT 3, VENOUS BLOOD GAS (G3P V)
ACID-BASE DEFICIT: 5 mmol/L — AB (ref 0.0–2.0)
Bicarbonate: 20.8 mmol/L (ref 20.0–28.0)
O2 SAT: 69 %
PH VEN: 7.32 (ref 7.250–7.430)
TCO2: 22 mmol/L (ref 22–32)
pCO2, Ven: 40.3 mmHg — ABNORMAL LOW (ref 44.0–60.0)
pO2, Ven: 39 mmHg (ref 32.0–45.0)

## 2017-10-05 LAB — ECHOCARDIOGRAM COMPLETE
Height: 76 in
Weight: 4000 oz

## 2017-10-05 LAB — POCT ACTIVATED CLOTTING TIME
ACTIVATED CLOTTING TIME: 252 s
ACTIVATED CLOTTING TIME: 582 s
ACTIVATED CLOTTING TIME: 131 s
Activated Clotting Time: 538 seconds

## 2017-10-05 LAB — POCT I-STAT, CHEM 8
BUN: 31 mg/dL — ABNORMAL HIGH (ref 8–23)
CALCIUM ION: 1.14 mmol/L — AB (ref 1.15–1.40)
Chloride: 88 mmol/L — ABNORMAL LOW (ref 98–111)
Creatinine, Ser: 0.9 mg/dL (ref 0.61–1.24)
GLUCOSE: 182 mg/dL — AB (ref 70–99)
HCT: 37 % — ABNORMAL LOW (ref 39.0–52.0)
Hemoglobin: 12.6 g/dL — ABNORMAL LOW (ref 13.0–17.0)
POTASSIUM: 4 mmol/L (ref 3.5–5.1)
SODIUM: 122 mmol/L — AB (ref 135–145)
TCO2: 20 mmol/L — ABNORMAL LOW (ref 22–32)

## 2017-10-05 LAB — LIPID PANEL
Cholesterol: 93 mg/dL (ref 0–200)
HDL: 24 mg/dL — ABNORMAL LOW (ref >40)
LDL Cholesterol: 44 mg/dL (ref 0–99)
Total CHOL/HDL Ratio: 3.9 RATIO
Triglycerides: 123 mg/dL (ref <150)
VLDL: 25 mg/dL (ref 0–40)

## 2017-10-05 LAB — APTT: aPTT: 29 seconds (ref 24–36)

## 2017-10-05 LAB — PROTIME-INR
INR: 1.17
Prothrombin Time: 14.8 seconds (ref 11.4–15.2)

## 2017-10-05 LAB — TROPONIN I: Troponin I: 0.03 ng/mL (ref <0.03)

## 2017-10-05 SURGERY — LEFT HEART CATH AND CORONARY ANGIOGRAPHY
Anesthesia: LOCAL

## 2017-10-05 MED ORDER — POLYETHYLENE GLYCOL 3350 17 GM/SCOOP PO POWD
17.0000 g | Freq: Two times a day (BID) | ORAL | Status: DC
  Administered 2017-10-05 – 2017-10-06 (×3): 17 g via ORAL
  Filled 2017-10-05 (×2): qty 255

## 2017-10-05 MED ORDER — VERAPAMIL HCL 2.5 MG/ML IV SOLN
INTRA_ARTERIAL | Status: DC | PRN
  Administered 2017-10-05: 13:00:00 via INTRA_ARTERIAL

## 2017-10-05 MED ORDER — LIDOCAINE HCL (PF) 1 % IJ SOLN
INTRAMUSCULAR | Status: AC
  Filled 2017-10-05: qty 30

## 2017-10-05 MED ORDER — HEPARIN (PORCINE) IN NACL 1000-0.9 UT/500ML-% IV SOLN
INTRAVENOUS | Status: DC | PRN
  Administered 2017-10-05 (×2): 500 mL

## 2017-10-05 MED ORDER — SIMETHICONE 40 MG/0.6ML PO SUSP
40.0000 mg | Freq: Four times a day (QID) | ORAL | Status: DC | PRN
  Administered 2017-10-05 – 2017-10-07 (×5): 40 mg via ORAL
  Filled 2017-10-05 (×6): qty 0.6

## 2017-10-05 MED ORDER — NITROGLYCERIN 1 MG/10 ML FOR IR/CATH LAB
INTRA_ARTERIAL | Status: AC
  Filled 2017-10-05: qty 10

## 2017-10-05 MED ORDER — TIROFIBAN (AGGRASTAT) BOLUS VIA INFUSION
25.0000 ug/kg | Freq: Once | INTRAVENOUS | Status: DC
  Filled 2017-10-05: qty 57

## 2017-10-05 MED ORDER — MIDAZOLAM HCL 2 MG/2ML IJ SOLN
INTRAMUSCULAR | Status: DC | PRN
  Administered 2017-10-05: 2 mg via INTRAVENOUS

## 2017-10-05 MED ORDER — FENTANYL CITRATE (PF) 100 MCG/2ML IJ SOLN
INTRAMUSCULAR | Status: DC | PRN
  Administered 2017-10-05 (×2): 50 ug via INTRAVENOUS

## 2017-10-05 MED ORDER — HEPARIN SODIUM (PORCINE) 1000 UNIT/ML IJ SOLN
INTRAMUSCULAR | Status: AC
  Filled 2017-10-05: qty 1

## 2017-10-05 MED ORDER — INSULIN ASPART 100 UNIT/ML ~~LOC~~ SOLN
0.0000 [IU] | Freq: Three times a day (TID) | SUBCUTANEOUS | Status: DC
  Administered 2017-10-05: 4 [IU] via SUBCUTANEOUS
  Administered 2017-10-06: 11 [IU] via SUBCUTANEOUS
  Administered 2017-10-06: 4 [IU] via SUBCUTANEOUS
  Administered 2017-10-06 – 2017-10-07 (×2): 7 [IU] via SUBCUTANEOUS

## 2017-10-05 MED ORDER — TIROFIBAN HCL IV 12.5 MG/250 ML
INTRAVENOUS | Status: DC | PRN
  Administered 2017-10-05: 0.15 ug/kg/min via INTRAVENOUS

## 2017-10-05 MED ORDER — VERAPAMIL HCL 2.5 MG/ML IV SOLN
INTRAVENOUS | Status: AC
  Filled 2017-10-05: qty 2

## 2017-10-05 MED ORDER — ZOLPIDEM TARTRATE 5 MG PO TABS
5.0000 mg | ORAL_TABLET | Freq: Every day | ORAL | Status: DC
  Administered 2017-10-05 – 2017-10-22 (×18): 5 mg via ORAL
  Filled 2017-10-05 (×18): qty 1

## 2017-10-05 MED ORDER — FENTANYL CITRATE (PF) 100 MCG/2ML IJ SOLN
INTRAMUSCULAR | Status: AC
  Filled 2017-10-05: qty 2

## 2017-10-05 MED ORDER — TIROFIBAN HCL IV 12.5 MG/250 ML
INTRAVENOUS | Status: AC
  Filled 2017-10-05: qty 250

## 2017-10-05 MED ORDER — MIDAZOLAM HCL 2 MG/2ML IJ SOLN
INTRAMUSCULAR | Status: AC
  Filled 2017-10-05: qty 2

## 2017-10-05 MED ORDER — BISACODYL 10 MG RE SUPP
10.0000 mg | Freq: Once | RECTAL | Status: AC
  Administered 2017-10-05: 10 mg via RECTAL
  Filled 2017-10-05: qty 1

## 2017-10-05 MED ORDER — PSYLLIUM 95 % PO PACK
1.0000 | PACK | Freq: Two times a day (BID) | ORAL | Status: DC
  Administered 2017-10-05 – 2017-10-20 (×27): 1 via ORAL
  Filled 2017-10-05 (×31): qty 1

## 2017-10-05 MED ORDER — CHLORHEXIDINE GLUCONATE CLOTH 2 % EX PADS
6.0000 | MEDICATED_PAD | Freq: Every day | CUTANEOUS | Status: AC
  Administered 2017-10-06 – 2017-10-10 (×3): 6 via TOPICAL

## 2017-10-05 MED ORDER — HYDRALAZINE HCL 20 MG/ML IJ SOLN: 5.0000 mg | INTRAMUSCULAR | Status: AC | PRN

## 2017-10-05 MED ORDER — SODIUM CHLORIDE 0.9% FLUSH
3.0000 mL | Freq: Two times a day (BID) | INTRAVENOUS | Status: DC
  Administered 2017-10-05 – 2017-10-11 (×6): 3 mL via INTRAVENOUS

## 2017-10-05 MED ORDER — ATORVASTATIN CALCIUM 80 MG PO TABS
80.0000 mg | ORAL_TABLET | Freq: Every day | ORAL | Status: DC
  Administered 2017-10-05: 80 mg via ORAL
  Filled 2017-10-05: qty 1

## 2017-10-05 MED ORDER — HEPARIN (PORCINE) IN NACL 1000-0.9 UT/500ML-% IV SOLN
INTRAVENOUS | Status: AC
  Filled 2017-10-05: qty 1000

## 2017-10-05 MED ORDER — SODIUM CHLORIDE 0.9 % IV SOLN: INTRAVENOUS | Status: AC

## 2017-10-05 MED ORDER — PRASUGREL HCL 10 MG PO TABS
10.0000 mg | ORAL_TABLET | Freq: Every day | ORAL | Status: DC
  Administered 2017-10-06 – 2017-10-22 (×17): 10 mg via ORAL
  Filled 2017-10-05 (×20): qty 1

## 2017-10-05 MED ORDER — NITROGLYCERIN 1 MG/10 ML FOR IR/CATH LAB
INTRA_ARTERIAL | Status: DC | PRN
  Administered 2017-10-05: 200 ug via INTRACORONARY

## 2017-10-05 MED ORDER — ACETAMINOPHEN 325 MG PO TABS: 650.0000 mg | ORAL_TABLET | ORAL | Status: DC | PRN

## 2017-10-05 MED ORDER — MUPIROCIN 2 % EX OINT
1.0000 "application " | TOPICAL_OINTMENT | Freq: Two times a day (BID) | CUTANEOUS | Status: AC
  Administered 2017-10-05 – 2017-10-10 (×10): 1 via NASAL
  Filled 2017-10-05 (×5): qty 22

## 2017-10-05 MED ORDER — DIAZEPAM 5 MG PO TABS
5.0000 mg | ORAL_TABLET | Freq: Once | ORAL | Status: AC
  Administered 2017-10-05: 5 mg via ORAL
  Filled 2017-10-05: qty 1

## 2017-10-05 MED ORDER — PERFLUTREN LIPID MICROSPHERE
1.0000 mL | INTRAVENOUS | Status: AC | PRN
  Administered 2017-10-05: 2 mL via INTRAVENOUS
  Filled 2017-10-05 (×2): qty 10

## 2017-10-05 MED ORDER — LOSARTAN POTASSIUM 25 MG PO TABS
12.5000 mg | ORAL_TABLET | Freq: Every day | ORAL | Status: DC
  Administered 2017-10-05 – 2017-10-06 (×2): 12.5 mg via ORAL
  Filled 2017-10-05 (×2): qty 1

## 2017-10-05 MED ORDER — SODIUM CHLORIDE 0.9% FLUSH: 3.0000 mL | INTRAVENOUS | Status: DC | PRN

## 2017-10-05 MED ORDER — SIMETHICONE 80 MG PO CHEW
80.0000 mg | CHEWABLE_TABLET | Freq: Once | ORAL | Status: AC
  Administered 2017-10-05: 80 mg via ORAL
  Filled 2017-10-05: qty 1

## 2017-10-05 MED ORDER — ONDANSETRON HCL 4 MG/2ML IJ SOLN: 4.0000 mg | Freq: Four times a day (QID) | INTRAMUSCULAR | Status: DC | PRN

## 2017-10-05 MED ORDER — DOCUSATE SODIUM 100 MG PO CAPS
100.0000 mg | ORAL_CAPSULE | Freq: Every day | ORAL | Status: DC
  Administered 2017-10-05 – 2017-10-12 (×8): 100 mg via ORAL
  Filled 2017-10-05 (×8): qty 1

## 2017-10-05 MED ORDER — PRASUGREL HCL 10 MG PO TABS
ORAL_TABLET | ORAL | Status: AC
  Filled 2017-10-05: qty 5

## 2017-10-05 MED ORDER — LIDOCAINE HCL (PF) 1 % IJ SOLN
INTRAMUSCULAR | Status: DC | PRN
  Administered 2017-10-05: 2 mL
  Administered 2017-10-05: 10 mL

## 2017-10-05 MED ORDER — HEPARIN SODIUM (PORCINE) 1000 UNIT/ML IJ SOLN
INTRAMUSCULAR | Status: DC | PRN
  Administered 2017-10-05: 8000 [IU] via INTRAVENOUS
  Administered 2017-10-05: 3000 [IU] via INTRAVENOUS

## 2017-10-05 MED ORDER — TIROFIBAN (AGGRASTAT) BOLUS VIA INFUSION
INTRAVENOUS | Status: DC | PRN
  Administered 2017-10-05: 2835 ug via INTRAVENOUS

## 2017-10-05 MED ORDER — HEPARIN SODIUM (PORCINE) 5000 UNIT/ML IJ SOLN
4000.0000 [IU] | Freq: Once | INTRAMUSCULAR | Status: AC
  Administered 2017-10-05: 4000 [IU] via INTRAVENOUS
  Filled 2017-10-05: qty 1

## 2017-10-05 MED ORDER — TIROFIBAN HCL IV 12.5 MG/250 ML
0.1500 ug/kg/min | INTRAVENOUS | Status: DC
  Filled 2017-10-05 (×2): qty 100

## 2017-10-05 MED ORDER — LABETALOL HCL 5 MG/ML IV SOLN: 10.0000 mg | INTRAVENOUS | Status: AC | PRN

## 2017-10-05 MED ORDER — NITROGLYCERIN 0.4 MG SL SUBL: 0.4000 mg | SUBLINGUAL_TABLET | SUBLINGUAL | Status: DC | PRN

## 2017-10-05 MED ORDER — BISACODYL 5 MG PO TBEC
10.0000 mg | DELAYED_RELEASE_TABLET | Freq: Every day | ORAL | Status: DC
Start: 2017-10-05 — End: 2017-10-12
  Administered 2017-10-05 – 2017-10-11 (×6): 10 mg via ORAL
  Filled 2017-10-05 (×8): qty 2

## 2017-10-05 MED ORDER — SODIUM CHLORIDE 0.9 % IV SOLN: 250.0000 mL | INTRAVENOUS | Status: DC | PRN

## 2017-10-05 MED ORDER — SODIUM CHLORIDE 0.9 % IV SOLN
INTRAVENOUS | Status: DC
  Administered 2017-10-05: 20 mL/h via INTRAVENOUS
  Administered 2017-10-08 – 2017-10-10 (×2): via INTRAVENOUS

## 2017-10-05 MED ORDER — ONDANSETRON HCL 4 MG/2ML IJ SOLN
4.0000 mg | Freq: Four times a day (QID) | INTRAMUSCULAR | Status: DC | PRN
  Administered 2017-10-05 – 2017-10-06 (×3): 4 mg via INTRAVENOUS
  Filled 2017-10-05 (×3): qty 2

## 2017-10-05 MED ORDER — MORPHINE SULFATE ER 15 MG PO TBCR
60.0000 mg | EXTENDED_RELEASE_TABLET | Freq: Three times a day (TID) | ORAL | Status: DC | PRN
  Administered 2017-10-05 – 2017-10-17 (×20): 60 mg via ORAL
  Filled 2017-10-05 (×20): qty 4

## 2017-10-05 MED ORDER — PRASUGREL HCL 10 MG PO TABS
ORAL_TABLET | ORAL | Status: AC
  Filled 2017-10-05: qty 1

## 2017-10-05 SURGICAL SUPPLY — 24 items
BALLN SAPPHIRE 3.0X15 (BALLOONS) ×2
BALLN SAPPHIRE ~~LOC~~ 3.0X15 (BALLOONS) ×1 IMPLANT
BALLN ~~LOC~~ EMERGE MR 3.0X8 (BALLOONS) ×2
BALLOON SAPPHIRE 3.0X15 (BALLOONS) IMPLANT
BALLOON ~~LOC~~ EMERGE MR 3.0X8 (BALLOONS) IMPLANT
CATH EXTRAC PRONTO LP 6F RND (CATHETERS) ×1 IMPLANT
CATH INFINITI JR4 5F (CATHETERS) ×1 IMPLANT
CATH LAUNCHER 6FR EBU3.5 (CATHETERS) ×1 IMPLANT
CATH SWAN GANZ 7F STRAIGHT (CATHETERS) ×1 IMPLANT
DEVICE RAD COMP TR BAND LRG (VASCULAR PRODUCTS) ×1 IMPLANT
GLIDESHEATH SLEND A-KIT 6F 20G (SHEATH) ×1 IMPLANT
GUIDEWIRE INQWIRE 1.5J.035X260 (WIRE) IMPLANT
INQWIRE 1.5J .035X260CM (WIRE) ×2
KIT ENCORE 26 ADVANTAGE (KITS) ×1 IMPLANT
KIT HEART LEFT (KITS) ×2 IMPLANT
KIT HEMO VALVE WATCHDOG (MISCELLANEOUS) ×1 IMPLANT
KIT MICROPUNCTURE NIT STIFF (SHEATH) ×1 IMPLANT
PACK CARDIAC CATHETERIZATION (CUSTOM PROCEDURE TRAY) ×2 IMPLANT
SHEATH PINNACLE 7F 10CM (SHEATH) ×1 IMPLANT
SHEATH PROBE COVER 6X72 (BAG) ×1 IMPLANT
STENT RESOLUTE ONYX 3.0X12 (Permanent Stent) ×1 IMPLANT
TRANSDUCER W/STOPCOCK (MISCELLANEOUS) ×2 IMPLANT
TUBING CIL FLEX 10 FLL-RA (TUBING) ×2 IMPLANT
WIRE COUGAR XT STRL 190CM (WIRE) ×1 IMPLANT

## 2017-10-05 NOTE — Progress Notes (Signed)
Rt groin venous sheath removed. ACT 131. Manual pressure held x's 15 minutes. Site level zero. VSS. Will continue to monitor closely.  Lucius Conn, RN

## 2017-10-05 NOTE — H&P (Signed)
Daniel Rivers is an 67 y.o. male.   Chief Complaint: Chest pain HPI: Daniel Rivers  is a 67 y.o. male  With known coronary artery disease, in 2009 he has had proximal RCA stent, again in 2006 has had angioplasty to his LAD and also his mid RCA with implantation of drug-eluting stents.  He has history of hypertension, hyperlipidemia, diabetes mellitus, chronic back pain and on chronic narcotic therapy.  He had been doing well and over the weekend had noticed mild dyspnea on exertion that is more than usual.  This morning around 9:30 AM he started having severe crushing chest discomfort with radiation to his neck and also to his arms associated with marked diaphoresis and also dyspnea.  Presented to the emergency room where he was found to have STEMI involving the anterolateral wall.  Patient is still having chest pain states that the pain is still 4 out of 10 in intensity, still has shortness of breath and diaphoresis.  No nausea or vomiting.  Past Medical History:  Diagnosis Date  . Ankylosing spondylitis of cervical region ([place])   . Ankylosing spondylitis of lumbar region ([place])   . Anxiety   . BPH (benign prostatic hypertrophy)   . Bursitis, trochanteric   . Chronic back pain   . Chronic cervical pain   . Coronary artery disease   . Daily headache    "recently; related to RX for my heart" (05/21/2014)  . Depression   . GERD (gastroesophageal reflux disease)   . Hemochromatosis   . High cholesterol   . History of hiatal hernia   . Hypertension   . 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 Obesity   . Osteoarthritis    "joints" (05/20/2104)  . Rotator cuff syndrome   . Tendonitis   . Type II diabetes mellitus ([place])     Past Surgical History:  Procedure Laterality Date  . ANAL FISSURE REPAIR    . ANKLE FRACTURE SURGERY Left   . BACK SURGERY    . BONE TISSUE     "I've had removal of excess tissue as a result of my ankylosing spondylitis"  . CARDIAC  CATHETERIZATION N/A 06/07/2014   Procedure: Coronary Stent Intervention;  Surgeon: Adrian Prows, MD;  Location: [facility];  Service: Cardiovascular;  Laterality: N/A;  . CARPAL TUNNEL RELEASE Bilateral   . COLON SURGERY    . CORONARY ANGIOPLASTY WITH STENT PLACEMENT  2009; 05/22/2014   ?3; ?1  . cortisone injection    . FRACTURE SURGERY    . I&D EXTREMITY Left 06/29/2017   Procedure: IRRIGATION AND DEBRIDEMENT EXTREMITY, LEFT FIFTH DIGIT AMPUTATION;  Surgeon: Iran Planas, MD;  Location: [facility];  Service: Orthopedics;  Laterality: Left;  . LEFT HEART CATHETERIZATION WITH CORONARY ANGIOGRAM N/A 05/22/2014   Procedure: LEFT HEART CATHETERIZATION WITH CORONARY ANGIOGRAM;  Surgeon: Adrian Prows, MD;  Location: [facility] CATH [facility];  Service: Cardiovascular;  Laterality: N/A;  . LUMBAR DISC SURGERY  X 2   "ruptured/herniated/growing together"  . PERCUTANEOUS CORONARY STENT INTERVENTION (PCI-S)  05/22/2014   Procedure: PERCUTANEOUS CORONARY STENT INTERVENTION (PCI-S);  Surgeon: Adrian Prows, MD;  Location: [facility] CATH LAB;  Service: Cardiovascular;;  mid LAD DES  . PERCUTANEOUS CORONARY STENT INTERVENTION (PCI-S)  06/07/2014   des to rca  . SHOULDER ARTHROSCOPY W/ ROTATOR CUFF REPAIR Bilateral   . SMALL INTESTINE SURGERY  1960   "took out 14 inches"  . ULNAR TUNNEL RELEASE Left  No family history on file. Social History:  reports that he quit smoking about 31 years ago. His smoking use included cigarettes. He has a 115.00 pack-year smoking history. He has never used smokeless tobacco. He reports that he drinks alcohol. He reports that he does not use drugs.  Allergies:  Allergies  Allergen Reactions  . Amlodipine Besy-Benazepril Hcl Cough  . Naproxen Other (See Comments)    Worsens Tinnitus   Review of Systems  Constitutional: Negative.   HENT: Negative.   Eyes: Negative.   Respiratory: Positive for shortness of breath. Negative for hemoptysis, sputum production and wheezing.   Cardiovascular:  Positive for chest pain. Negative for orthopnea, claudication, leg swelling and PND.  Gastrointestinal: Positive for constipation (chronic). Negative for blood in stool and melena.  Genitourinary: Negative.   Musculoskeletal: Positive for back pain (chronic and on MS and percocet) and joint pain.  Skin: Negative.   Neurological: Negative.   Endo/Heme/Allergies: Negative.   Psychiatric/Behavioral: Negative.   All other systems reviewed and are negative.    Blood pressure (!) 81/63, pulse (!) 102, temperature 98.4 F (36.9 C), temperature source Oral, resp. rate (!) 23, height 6\' 4"  (1.93 m), weight 113.4 kg, SpO2 98 %. Body mass index is 30.43 kg/m.  Physical Exam  Constitutional: He is oriented to person, place, and time. He appears well-developed and well-nourished. He appears distressed (mild due to pain).  HENT:  Head: Atraumatic.  Eyes: Conjunctivae are normal.  Neck: Neck supple. No JVD present.  Cardiovascular: Normal rate, regular rhythm and normal heart sounds.  No murmur heard. Pulses:      Carotid pulses are 3+ on the right side, and 3+ on the left side.      Radial pulses are 3+ on the right side, and 3+ on the left side.       Femoral pulses are 3+ on the right side, and 3+ on the left side.      Popliteal pulses are 1+ on the right side, and 1+ on the left side.       Dorsalis pedis pulses are 0 on the right side, and 0 on the left side.       Posterior tibial pulses are 2+ on the right side, and 2+ on the left side.  Abdominal: Soft. Bowel sounds are normal. He exhibits distension (large abdomen, obese). There is no tenderness.  Musculoskeletal: He exhibits no edema or deformity.  Lymphadenopathy:    He has no cervical adenopathy.  Neurological: He is alert and oriented to person, place, and time.  Skin: Skin is warm and dry.  Psychiatric: He has a normal mood and affect.    Results for orders placed or performed during the hospital encounter of 10/27/2017 (from  the past 48 hour(s))  CBC with Differential/Platelet     Status: Abnormal   Collection Time: 10/27/2017 11:46 AM  Result Value Ref Range   WBC 10.2 4.0 - 10.5 K/uL   RBC 4.80 4.22 - 5.81 MIL/uL   Hemoglobin 13.9 13.0 - 17.0 g/dL   HCT 42.9 39.0 - 52.0 %   MCV 89.4 78.0 - 100.0 fL   MCH 29.0 26.0 - 34.0 pg   MCHC 32.4 30.0 - 36.0 g/dL   RDW 13.6 11.5 - 15.5 %   Platelets 254 150 - 400 K/uL   Neutrophils Relative % 61 %   Neutro Abs 6.2 1.7 - 7.7 K/uL   Lymphocytes Relative 25 %   Lymphs Abs 2.6 0.7 - 4.0 K/uL  Monocytes Relative 11 %   Monocytes Absolute 1.2 (H) 0.1 - 1.0 K/uL   Eosinophils Relative 2 %   Eosinophils Absolute 0.2 0.0 - 0.7 K/uL   Basophils Relative 1 %   Basophils Absolute 0.1 0.0 - 0.1 K/uL   Immature Granulocytes 0 %   Abs Immature Granulocytes 0.0 0.0 - 0.1 K/uL    Comment: Performed at [facility] [address], [place], [place] [address]    Labs:   Lab Results  Component Value Date   WBC 10.2 10/11/2017   HGB 13.9 10/04/2017   HCT 42.9 10/29/2017   MCV 89.4 10/08/2017   PLT 254 10/04/2017   BMP Latest Ref Rng & Units 09/16/2017 06/29/2017 06/16/2017  Glucose 70 - 99 mg/dL 127(H) 222(H) 90  BUN 8 - 23 mg/dL 15 14 16   Creatinine 0.61 - 1.24 mg/dL 0.89 0.97 0.91  Sodium 135 - 145 mmol/L 139 135 139  Potassium 3.5 - 5.1 mmol/L 4.1 4.0 4.5  Chloride 98 - 111 mmol/L 101 99(L) 102  CO2 22 - 32 mmol/L 29 26 31(H)  Calcium 8.9 - 10.3 mg/dL 9.4 9.4 10.5(H)    Medications Prior to Admission  Medication Sig Dispense Refill  . aspirin 81 MG tablet Take 81 mg by mouth daily.     . bisacodyl (DULCOLAX) 5 MG EC tablet Take 10 mg by mouth at bedtime.     [name] buPROPion (WELLBUTRIN XL) 300 MG 24 hr tablet Take 1 tablet by mouth daily.    . carvedilol (COREG) 3.125 MG tablet Take 3.125 mg by mouth 2 (two) times daily.    . Cholecalciferol (VITAMIN D3) 2000 UNITS TABS Take 1 tablet by mouth daily.    . diclofenac (VOLTAREN) 75 MG EC tablet Take 1 tablet (75  mg total) by mouth 2 (two) times daily with a meal. As needed (Patient taking differently: Take 75 mg by mouth 2 (two) times daily as needed (hip pain). ) 60 tablet 2  . furosemide (LASIX) 20 MG tablet Take 20 mg by mouth as needed for fluid.     [name] glimepiride (AMARYL) 2 MG tablet Take 3 mg by mouth daily before breakfast.     . KRILL OIL PO Take 1 tablet by mouth daily.     [name] losartan (COZAAR) 100 MG tablet Take 50 mg by mouth daily.     . Magnesium Oxide 400 MG CAPS Take 400 mg by mouth daily.     . metFORMIN (GLUCOPHAGE) 1000 MG tablet Take 1,000 mg by mouth every 12 (twelve) hours.    [name] morphine (MS CONTIN) 60 MG 12 hr tablet Take 1 tablet (60 mg total) by mouth 3 (three) times daily. 90 tablet 0  . Multiple Vitamin (MULTIVITAMIN) capsule Take 1 capsule by mouth daily. NO IRON    . NITROSTAT 0.4 MG SL tablet Place 0.4 mg under the tongue every 5 (five) minutes as needed for chest pain.     [name] oxyCODONE (OXY IR/ROXICODONE) 5 MG immediate release tablet Take 1 tablet (5 mg total) by mouth every 6 (six) hours as needed for severe pain. 120 tablet 0  . polyethylene glycol powder (GLYCOLAX/MIRALAX) powder Using measuring cap mix  17gm in water and drink two times daily (Patient taking differently: Take 17 g by mouth 2 (two) times daily. ) 3162 g 11  . pravastatin (PRAVACHOL) 80 MG tablet Take 80 mg by mouth at bedtime.     . Psyllium (METAMUCIL PO) Take 17 g by mouth  2 (two) times daily.     [name] zolpidem (AMBIEN) 10 MG tablet Take 10 mg by mouth at bedtime.         Current Facility-Administered Medications:  .  0.9 %  sodium chloride infusion, , Intravenous, Continuous, Virgel Manifold, MD, Last Rate: 20 mL/hr at 10/04/2017 1206, 20 mL/hr at 10/20/2017 1206 .  [MAR Hold] tirofiban (AGGRASTAT) bolus via infusion 2,835 mcg, 25 mcg/kg, Intravenous, Once, Adrian Prows, MD .  tirofiban (AGGRASTAT) infusion 50 mcg/mL 250 mL, 0.15 mcg/kg/min, Intravenous, Continuous, Virgel Manifold, MD  CARDIAC STUDIES:  EKG  10/31/2017: Normal sinus rhythm with rate of 97 bpm, ST elevation in V1 and V2, hyperacute T waves noted in V3 and V4 and V5.  Reciprocal ST depression in the inferior leads.  Meets criteria for STEMI.  ECHO Pending  Assessment/Plan 1.  STEMI, anterolateral wall 2.  CAD of the native vessel, history of angioplasty to RCA, old stent proximal in 2009, mid and mid to distal RCA up to the level of the crux was stented. 100% stenosis reduced to 0%. Distally a 3.0 x 38 mm and mid to proximal overlap with a 3.5 x 26 mm resolute integrity DES on 06/07/2014 and mid LAD on 05/22/2014. 3.  Chronic pain syndrome with chronic back pain 4.  Diabetes mellitus type 2 controlled 5.  Cardiogenic pre-shock, systolic blood pressure around 80 to 85 mmHg.  Recommendation: Patient needs emergent coronary angiography and possible angioplasty.  We will discuss management of diabetes, pre-shock in the catheterization lab.  All questions answered, patient is willing to proceed.  Adrian Prows, MD 10/09/2017, 12:20 PM [facility]. [place] Pager: [phone] Office: [phone] If no answer: Cell:  [phone]

## 2017-10-05 NOTE — ED Notes (Signed)
Lab called this RN with critical troponin of 0.03, pt is in cath lab.
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
## 2017-10-05 NOTE — Progress Notes (Signed)
   10/10/2017 1200  Clinical Encounter Type  Visited With Patient  Visit Type Initial  Advance Directives (For Healthcare)  Does Patient Have a Medical Advance Directive? No  Would patient like information on creating a medical advance directive? No - Patient declined   Chaplain responded to code stemi in ER. PT was alert and wanted Chaplain to call son. Chaplain called and left message. PT went to [employer]. Chaplain provided ministry of presence.

## 2017-10-05 NOTE — ED Triage Notes (Signed)
Pt in from home c/o SOB onset yesterday with CP mid radiating to bil shoulder and bil jaw, pt took 324 mg ASA pta, pt had x 2 sL nitro pta, pt has # 20 to R hand, pt had SOB yesterday, CP today, pt on 2L [place], pt A&O x4, pt on [place] 2L upon arrival, pt had initial sys 90s. Pt got x 2 sL nitro with BP decreasing, pt rcve 800 mL NS with BP increased to 98/52, A&O x4

## 2017-10-06 ENCOUNTER — Encounter (HOSPITAL_COMMUNITY): Payer: Self-pay | Admitting: Cardiology

## 2017-10-06 ENCOUNTER — Encounter: Payer: Medicare Other | Admitting: Physical Medicine & Rehabilitation

## 2017-10-06 LAB — CBC WITH DIFFERENTIAL/PLATELET
Abs Immature Granulocytes: 0 10*3/uL (ref 0.0–0.1)
BASOS PCT: 1 %
Basophils Absolute: 0.1 10*3/uL (ref 0.0–0.1)
EOS ABS: 0.1 10*3/uL (ref 0.0–0.7)
EOS PCT: 1 %
HEMATOCRIT: 41 % (ref 39.0–52.0)
Hemoglobin: 13.7 g/dL (ref 13.0–17.0)
Immature Granulocytes: 0 %
Lymphocytes Relative: 21 %
Lymphs Abs: 2.1 10*3/uL (ref 0.7–4.0)
MCH: 29.3 pg (ref 26.0–34.0)
MCHC: 33.4 g/dL (ref 30.0–36.0)
MCV: 87.8 fL (ref 78.0–100.0)
MONO ABS: 1.1 10*3/uL — AB (ref 0.1–1.0)
MONOS PCT: 11 %
Neutro Abs: 6.6 10*3/uL (ref 1.7–7.7)
Neutrophils Relative %: 66 %
PLATELETS: 243 10*3/uL (ref 150–400)
RBC: 4.67 MIL/uL (ref 4.22–5.81)
RDW: 13.7 % (ref 11.5–15.5)
WBC: 9.9 10*3/uL (ref 4.0–10.5)

## 2017-10-06 LAB — LIPID PANEL
CHOLESTEROL: 96 mg/dL (ref 0–200)
HDL: 23 mg/dL — ABNORMAL LOW (ref >40)
LDL CALC: 41 mg/dL (ref 0–99)
TRIGLYCERIDES: 160 mg/dL — AB (ref <150)
Total CHOL/HDL Ratio: 4.2 RATIO
VLDL: 32 mg/dL (ref 0–40)

## 2017-10-06 LAB — HIV ANTIBODY (ROUTINE TESTING W REFLEX): HIV Screen 4th Generation wRfx: NONREACTIVE

## 2017-10-06 LAB — BASIC METABOLIC PANEL
Anion gap: 11 (ref 5–15)
BUN: 25 mg/dL — AB (ref 8–23)
CALCIUM: 8.9 mg/dL (ref 8.9–10.3)
CO2: 22 mmol/L (ref 22–32)
CREATININE: 0.88 mg/dL (ref 0.61–1.24)
Chloride: 102 mmol/L (ref 98–111)
GFR calc Af Amer: >60 mL/min (ref >60)
Glucose, Bld: 176 mg/dL — ABNORMAL HIGH (ref 70–99)
POTASSIUM: 4.5 mmol/L (ref 3.5–5.1)
SODIUM: 135 mmol/L (ref 135–145)

## 2017-10-06 LAB — GLUCOSE, CAPILLARY
GLUCOSE-CAPILLARY: 223 mg/dL — AB (ref 70–99)
GLUCOSE-CAPILLARY: 235 mg/dL — AB (ref 70–99)
Glucose-Capillary: 256 mg/dL — ABNORMAL HIGH (ref 70–99)
Glucose-Capillary: 178 mg/dL — ABNORMAL HIGH (ref 70–99)

## 2017-10-06 LAB — HEMOGLOBIN A1C
HEMOGLOBIN A1C: 6.9 % — AB (ref 4.8–5.6)
Mean Plasma Glucose: 151.33 mg/dL

## 2017-10-06 LAB — TROPONIN I: Troponin I: 26.74 ng/mL (ref <0.03)

## 2017-10-06 MED ORDER — LOSARTAN POTASSIUM 25 MG PO TABS
25.0000 mg | ORAL_TABLET | Freq: Every day | ORAL | Status: DC
  Administered 2017-10-07: 25 mg via ORAL
  Filled 2017-10-06: qty 1

## 2017-10-06 MED ORDER — ATORVASTATIN CALCIUM 40 MG PO TABS
40.0000 mg | ORAL_TABLET | Freq: Every day | ORAL | Status: DC
  Administered 2017-10-06 – 2017-10-16 (×10): 40 mg via ORAL
  Filled 2017-10-06 (×12): qty 1

## 2017-10-06 MED ORDER — NALOXEGOL OXALATE 12.5 MG PO TABS
12.5000 mg | ORAL_TABLET | Freq: Every day | ORAL | Status: DC
  Administered 2017-10-06 – 2017-10-22 (×17): 12.5 mg via ORAL
  Filled 2017-10-06 (×20): qty 1

## 2017-10-06 MED ORDER — ASPIRIN EC 81 MG PO TBEC
81.0000 mg | DELAYED_RELEASE_TABLET | Freq: Every day | ORAL | Status: DC
  Administered 2017-10-06 – 2017-10-22 (×17): 81 mg via ORAL
  Filled 2017-10-06 (×17): qty 1

## 2017-10-06 MED ORDER — POLYETHYLENE GLYCOL 3350 17 G PO PACK
17.0000 g | PACK | Freq: Two times a day (BID) | ORAL | Status: DC
  Administered 2017-10-06 – 2017-10-22 (×24): 17 g via ORAL
  Filled 2017-10-06 (×26): qty 1

## 2017-10-06 MED ORDER — HEPARIN SODIUM (PORCINE) 5000 UNIT/ML IJ SOLN
5000.0000 [IU] | Freq: Three times a day (TID) | INTRAMUSCULAR | Status: DC
  Administered 2017-10-07 – 2017-10-24 (×51): 5000 [IU] via SUBCUTANEOUS
  Filled 2017-10-06 (×51): qty 1

## 2017-10-06 MED ORDER — DIAZEPAM 5 MG PO TABS
5.0000 mg | ORAL_TABLET | Freq: Three times a day (TID) | ORAL | Status: DC | PRN
  Administered 2017-10-06 – 2017-10-09 (×6): 10 mg via ORAL
  Administered 2017-10-10 – 2017-10-11 (×2): 5 mg via ORAL
  Administered 2017-10-12: 10 mg via ORAL
  Filled 2017-10-06: qty 1
  Filled 2017-10-06 (×3): qty 2
  Filled 2017-10-06: qty 1
  Filled 2017-10-06 (×5): qty 2

## 2017-10-06 MED ORDER — FUROSEMIDE 10 MG/ML IJ SOLN
20.0000 mg | Freq: Once | INTRAMUSCULAR | Status: AC
  Administered 2017-10-07: 20 mg via INTRAVENOUS
  Filled 2017-10-06: qty 2

## 2017-10-06 MED ORDER — BUPROPION HCL ER (XL) 300 MG PO TB24
300.0000 mg | ORAL_TABLET | Freq: Every day | ORAL | Status: DC
  Administered 2017-10-06 – 2017-10-22 (×17): 300 mg via ORAL
  Filled 2017-10-06 (×3): qty 1
  Filled 2017-10-06: qty 2
  Filled 2017-10-06: qty 1
  Filled 2017-10-06: qty 2
  Filled 2017-10-06 (×9): qty 1
  Filled 2017-10-06: qty 2
  Filled 2017-10-06: qty 1

## 2017-10-06 MED ORDER — INFLUENZA VAC SPLIT HIGH-DOSE 0.5 ML IM SUSY
0.5000 mL | PREFILLED_SYRINGE | INTRAMUSCULAR | Status: DC
  Filled 2017-10-06: qty 0.5

## 2017-10-06 MED ORDER — LOSARTAN POTASSIUM 25 MG PO TABS
12.5000 mg | ORAL_TABLET | Freq: Once | ORAL | Status: AC
  Administered 2017-10-06: 12.5 mg via ORAL
  Filled 2017-10-06: qty 1

## 2017-10-06 NOTE — Progress Notes (Signed)
1320-1420 Pt stated he was in a wet bed. Offered to put pt in recliner which I did. Comfort measures given. Pt appears SOB when he talks. He stated NGT is making him anxious. Gave pt MI booklet. Reviewed the importance of effient with stent. Needs to see case manager re effient. Reviewed NTG use, heart healthy food choices, watching carbs, CRP 2. Pt stated he will go to Cardiac Rehab but declined last admissions. RN in to see pt and he is requesting something for anxiety. Will follow up tomorrow. Graylon Good RN BSN 10/06/2017 2:19 PM

## 2017-10-06 NOTE — Progress Notes (Signed)
Subjective:  Feels better this morning Had a large bowel movement  Objective:  Vital Signs in the last 24 hours: Temp:  [98.1 F (36.7 C)-98.5 F (36.9 C)] 98.2 F (36.8 C) (09/04 0815) Pulse Rate:  [0-135] 104 (09/04 1000) Resp:  [10-30] 16 (09/04 1000) BP: (81-147)/(41-125) 121/67 (09/04 1000) SpO2:  [93 %-100 %] 95 % (09/04 1000) Weight:  [113.4 kg] 113.4 kg (09/03 1140)  Intake/Output from previous day: 09/03 0701 - 09/04 0700 In: 544 [P.O.:480; I.V.:64] Out: 500 [Urine:500] Intake/Output from this shift: Total I/O In: 480 [P.O.:480] Out: -   Physical Exam: Constitutional: He is oriented to person, place, and time. He appears well-developed and well-nourished. He appears in no distress  HENT:  Head: Atraumatic.  Eyes: Conjunctivae are normal.  Neck: Neck supple. No JVD present.  Cardiovascular: Normal rate, regular rhythm and normal heart sounds.  No murmur heard. Pulses:      Carotid pulses are 3+ on the right side, and 3+ on the left side.      Radial pulses are 3+ on the right side, and 3+ on the left side.       Femoral pulses are 3+ on the right side, and 3+ on the left side.      Popliteal pulses are 1+ on the right side, and 1+ on the left side.       Dorsalis pedis pulses are 0 on the right side, and 0 on the left side.       Posterior tibial pulses are 2+ on the right side, and 2+ on the left side.  Abdominal: Soft. Bowel sounds are normal.  Distension improved compared to 09/03. There is no tenderness.  Musculoskeletal: He exhibits no edema or deformity.  Lymphadenopathy:    He has no cervical adenopathy.  Neurological: He is alert and oriented to person, place, and time.  Skin: Skin is warm and dry.  Psychiatric: He has a normal mood and affect.   Lab Results: Recent Labs    10/08/2017 1146 11/01/2017 1241 10/06/17 0331  WBC 10.2  --  9.9  HGB 13.9 12.6* 13.7  PLT 254  --  243   Recent Labs    10/28/2017 1146 10/29/2017 1241 10/06/17 0331  NA 135  122* 135  K 4.4 4.0 4.5  CL 101 88* 102  CO2 21*  --  22  GLUCOSE 215* 182* 176*  BUN 33* 31* 25*  CREATININE 1.50* 0.90 0.88   Recent Labs    10/22/2017 1146 10/06/17 0331  TROPONINI 0.03* 26.74*   Hepatic Function Panel Recent Labs    11/01/2017 1146  PROT 6.0*  ALBUMIN 3.0*  AST 31  ALT 25  ALKPHOS 47  BILITOT 1.8*   Recent Labs    10/06/17 0331  CHOL 96    Cardiac Studies: EKG 10/06/2017: Sinus tachycardia 100 bpm. Age indeterminate anrteroseptal and inferior infarct. ST elevation improved compared to previous EKG.   Echocardiogram 10/22/2017: Study Conclusions  - Left ventricle: The cavity size was normal. There was mild   concentric hypertrophy. Systolic function was moderately reduced.   The estimated ejection fraction was in the range of 35% to 40%.   Severe hypokinesis of the anteroseptal and apical myocardium.   Features are consistent with a pseudonormal left ventricular   filling pattern, with concomitant abnormal relaxation and   increased filling pressure (grade 2 diastolic dysfunction). - Aortic valve: Mildly calcified annulus. - No hemodynamically significant valvular abnormality.   No evidence of pulmonary hypertension.  LM: Normal LAD: Prox LAD severely calcified 80% stenosis (Non-culprit)         Mid LAD 100% ISR ([place] stenosis) LCx: Mild prox disease RCA: Patent prox-mid stent. Focal 50% restenosis  Successful PTCA and stent placement, plus aspiration thrombectomy  RA: 12 mmHg PA: 34/22 mmHg Mean PA 27 mmHg PW: 17 mmHg  LV 84/9 mmHg, LVEDP 16 mmHg  CO: 6.1 L/min CI 2.5 L/min/m2  No cardiogenic shock   Assessment/Plan:  67 year old Caucasian male with morbid obesity, hypertension, type 2 diabetes mellitus, on chronic narcotic medications for back pain, CAD status post prior LAD and RCA PCI, admitted with anterior STEMI 09/03 s/p PCI  STEMI:  Culprit vessel mid LAD s/p aspiration thrombectomy and PCI Synergy DES 3.0 X  12 mm. Residual prox LAD severely calcified 80% stenosis. Conitnue aspirin/Effient. EF 35-40%. Increase losartan to 25 mg. Await beta blocker initiation till tomorrow.  Abdominal distension: No SBO on abdominal Xray. US abdomen shows only mild ascites. Distension better after NG tube placement and enema. Continue to monitor.   Type 2 DM:  Controlled   Transfer to stepdown unit today.    LOS: 1 day    Daniel Rivers 10/06/2017, 11:31 AM  [place], MD [facility] [place] Pager: [phone] Office: [phone] If no answer Cell [phone]

## 2017-10-07 ENCOUNTER — Encounter (HOSPITAL_COMMUNITY): Admission: EM | Disposition: E | Payer: Self-pay | Source: Home / Self Care | Attending: Internal Medicine

## 2017-10-07 ENCOUNTER — Encounter (HOSPITAL_COMMUNITY): Payer: Self-pay | Admitting: Cardiology

## 2017-10-07 ENCOUNTER — Inpatient Hospital Stay (HOSPITAL_COMMUNITY): Payer: Medicare Other

## 2017-10-07 HISTORY — PX: LEFT HEART CATH AND CORONARY ANGIOGRAPHY: CATH118249

## 2017-10-07 LAB — GLUCOSE, CAPILLARY
GLUCOSE-CAPILLARY: 153 mg/dL — AB (ref 70–99)
GLUCOSE-CAPILLARY: 191 mg/dL — AB (ref 70–99)
GLUCOSE-CAPILLARY: 212 mg/dL — AB (ref 70–99)
Glucose-Capillary: 159 mg/dL — ABNORMAL HIGH (ref 70–99)
Glucose-Capillary: 173 mg/dL — ABNORMAL HIGH (ref 70–99)

## 2017-10-07 LAB — CBC
HEMATOCRIT: 39.8 % (ref 39.0–52.0)
HEMOGLOBIN: 13.2 g/dL (ref 13.0–17.0)
MCH: 29.1 pg (ref 26.0–34.0)
MCHC: 33.2 g/dL (ref 30.0–36.0)
MCV: 87.9 fL (ref 78.0–100.0)
Platelets: 209 10*3/uL (ref 150–400)
RBC: 4.53 MIL/uL (ref 4.22–5.81)
RDW: 13.8 % (ref 11.5–15.5)
WBC: 10 10*3/uL (ref 4.0–10.5)

## 2017-10-07 LAB — BRAIN NATRIURETIC PEPTIDE: B Natriuretic Peptide: 135.9 pg/mL — ABNORMAL HIGH (ref 0.0–100.0)

## 2017-10-07 LAB — LACTIC ACID, PLASMA
LACTIC ACID, VENOUS: 2.6 mmol/L — AB (ref 0.5–1.9)
Lactic Acid, Venous: 2.6 mmol/L (ref 0.5–1.9)
Lactic Acid, Venous: 2.4 mmol/L (ref 0.5–1.9)

## 2017-10-07 SURGERY — LEFT HEART CATH AND CORONARY ANGIOGRAPHY
Anesthesia: LOCAL

## 2017-10-07 MED ORDER — IOPAMIDOL (ISOVUE-300) INJECTION 61%
INTRAVENOUS | Status: AC
  Filled 2017-10-07: qty 30

## 2017-10-07 MED ORDER — ONDANSETRON HCL 4 MG/2ML IJ SOLN
4.0000 mg | Freq: Four times a day (QID) | INTRAMUSCULAR | Status: DC | PRN
  Administered 2017-10-18: 4 mg via INTRAVENOUS
  Filled 2017-10-07: qty 2

## 2017-10-07 MED ORDER — VERAPAMIL HCL 2.5 MG/ML IV SOLN
INTRAVENOUS | Status: AC
  Filled 2017-10-07: qty 2

## 2017-10-07 MED ORDER — HEPARIN (PORCINE) IN NACL 1000-0.9 UT/500ML-% IV SOLN
INTRAVENOUS | Status: AC
  Filled 2017-10-07: qty 1000

## 2017-10-07 MED ORDER — ACETAMINOPHEN 325 MG PO TABS: 650.0000 mg | ORAL_TABLET | ORAL | Status: DC | PRN

## 2017-10-07 MED ORDER — HEPARIN SODIUM (PORCINE) 1000 UNIT/ML IJ SOLN
INTRAMUSCULAR | Status: DC | PRN
  Administered 2017-10-07: 5000 [IU] via INTRAVENOUS

## 2017-10-07 MED ORDER — VERAPAMIL HCL 2.5 MG/ML IV SOLN
INTRAVENOUS | Status: DC | PRN
  Administered 2017-10-07: 08:00:00 via INTRA_ARTERIAL

## 2017-10-07 MED ORDER — IOHEXOL 350 MG/ML SOLN
INTRAVENOUS | Status: DC | PRN
  Administered 2017-10-07: 65 mL via INTRAVENOUS

## 2017-10-07 MED ORDER — IOPAMIDOL (ISOVUE-300) INJECTION 61%
INTRAVENOUS | Status: AC
  Administered 2017-10-07: 12:00:00
  Filled 2017-10-07: qty 100

## 2017-10-07 MED ORDER — LIDOCAINE HCL (PF) 1 % IJ SOLN
INTRAMUSCULAR | Status: AC
  Filled 2017-10-07: qty 30

## 2017-10-07 MED ORDER — SODIUM CHLORIDE 0.9 % IV SOLN: 250.0000 mL | INTRAVENOUS | Status: DC | PRN

## 2017-10-07 MED ORDER — SODIUM CHLORIDE 0.9 % IV SOLN: INTRAVENOUS | Status: AC

## 2017-10-07 MED ORDER — HEPARIN (PORCINE) IN NACL 1000-0.9 UT/500ML-% IV SOLN
INTRAVENOUS | Status: DC | PRN
  Administered 2017-10-07 (×3): 500 mL

## 2017-10-07 MED ORDER — LIDOCAINE HCL (PF) 1 % IJ SOLN
INTRAMUSCULAR | Status: DC | PRN
  Administered 2017-10-07: 2 mL via INTRADERMAL

## 2017-10-07 MED ORDER — HEPARIN SODIUM (PORCINE) 1000 UNIT/ML IJ SOLN
INTRAMUSCULAR | Status: AC
  Filled 2017-10-07: qty 1

## 2017-10-07 MED ORDER — SODIUM CHLORIDE 0.9% FLUSH: 3.0000 mL | INTRAVENOUS | Status: DC | PRN

## 2017-10-07 MED ORDER — HEPARIN (PORCINE) IN NACL 1000-0.9 UT/500ML-% IV SOLN
INTRAVENOUS | Status: AC
  Filled 2017-10-07: qty 500

## 2017-10-07 MED ORDER — SODIUM CHLORIDE 0.9% FLUSH
3.0000 mL | Freq: Two times a day (BID) | INTRAVENOUS | Status: DC
  Administered 2017-10-07 – 2017-10-09 (×3): 3 mL via INTRAVENOUS

## 2017-10-07 MED ORDER — NITROGLYCERIN 1 MG/10 ML FOR IR/CATH LAB
INTRA_ARTERIAL | Status: AC
  Filled 2017-10-07: qty 10

## 2017-10-07 MED ORDER — INSULIN ASPART 100 UNIT/ML ~~LOC~~ SOLN
4.0000 [IU] | Freq: Three times a day (TID) | SUBCUTANEOUS | Status: DC
  Administered 2017-10-08 – 2017-10-15 (×22): 4 [IU] via SUBCUTANEOUS

## 2017-10-07 MED ORDER — IOPAMIDOL (ISOVUE-300) INJECTION 61%
100.0000 mL | Freq: Once | INTRAVENOUS | Status: AC | PRN
  Administered 2017-10-07: 100 mL via INTRAVENOUS

## 2017-10-07 MED ORDER — INSULIN ASPART 100 UNIT/ML ~~LOC~~ SOLN
0.0000 [IU] | Freq: Three times a day (TID) | SUBCUTANEOUS | Status: DC
  Administered 2017-10-07 (×2): 3 [IU] via SUBCUTANEOUS
  Administered 2017-10-08: 5 [IU] via SUBCUTANEOUS
  Administered 2017-10-08: 3 [IU] via SUBCUTANEOUS
  Administered 2017-10-09: 5 [IU] via SUBCUTANEOUS
  Administered 2017-10-09: 8 [IU] via SUBCUTANEOUS
  Administered 2017-10-09: 5 [IU] via SUBCUTANEOUS
  Administered 2017-10-10: 3 [IU] via SUBCUTANEOUS
  Administered 2017-10-10: 8 [IU] via SUBCUTANEOUS
  Administered 2017-10-10: 5 [IU] via SUBCUTANEOUS
  Administered 2017-10-11: 2 [IU] via SUBCUTANEOUS
  Administered 2017-10-11: 8 [IU] via SUBCUTANEOUS
  Administered 2017-10-11 – 2017-10-12 (×2): 5 [IU] via SUBCUTANEOUS
  Administered 2017-10-12: 3 [IU] via SUBCUTANEOUS
  Administered 2017-10-12: 5 [IU] via SUBCUTANEOUS
  Administered 2017-10-13: 3 [IU] via SUBCUTANEOUS
  Administered 2017-10-13: 5 [IU] via SUBCUTANEOUS
  Administered 2017-10-13: 3 [IU] via SUBCUTANEOUS
  Administered 2017-10-14: 2 [IU] via SUBCUTANEOUS
  Administered 2017-10-14 (×2): 3 [IU] via SUBCUTANEOUS
  Administered 2017-10-15: 5 [IU] via SUBCUTANEOUS
  Administered 2017-10-15: 3 [IU] via SUBCUTANEOUS
  Administered 2017-10-15: 2 [IU] via SUBCUTANEOUS
  Administered 2017-10-16 (×2): 8 [IU] via SUBCUTANEOUS
  Administered 2017-10-16 – 2017-10-18 (×5): 5 [IU] via SUBCUTANEOUS
  Administered 2017-10-18: 3 [IU] via SUBCUTANEOUS
  Administered 2017-10-18: 5 [IU] via SUBCUTANEOUS
  Administered 2017-10-19: 8 [IU] via SUBCUTANEOUS
  Administered 2017-10-19 – 2017-10-20 (×3): 5 [IU] via SUBCUTANEOUS
  Administered 2017-10-20 – 2017-10-21 (×2): 3 [IU] via SUBCUTANEOUS
  Administered 2017-10-21 (×2): 5 [IU] via SUBCUTANEOUS
  Administered 2017-10-22 (×2): 3 [IU] via SUBCUTANEOUS
  Administered 2017-10-23 (×2): 5 [IU] via SUBCUTANEOUS
  Administered 2017-10-24: 8 [IU] via SUBCUTANEOUS

## 2017-10-07 SURGICAL SUPPLY — 12 items
CATH INFINITI JR4 5F (CATHETERS) ×1 IMPLANT
CATH LAUNCHER 6FR EBU3.5 (CATHETERS) ×1 IMPLANT
DEVICE RAD COMP TR BAND LRG (VASCULAR PRODUCTS) ×1 IMPLANT
GLIDESHEATH SLEND A-KIT 6F 22G (SHEATH) ×1 IMPLANT
GUIDEWIRE INQWIRE 1.5J.035X260 (WIRE) IMPLANT
INQWIRE 1.5J .035X260CM (WIRE) ×2
KIT ENCORE 26 ADVANTAGE (KITS) ×1 IMPLANT
KIT HEART LEFT (KITS) ×2 IMPLANT
PACK CARDIAC CATHETERIZATION (CUSTOM PROCEDURE TRAY) ×2 IMPLANT
SHEATH PROBE COVER 6X72 (BAG) ×2 IMPLANT
TRANSDUCER W/STOPCOCK (MISCELLANEOUS) ×2 IMPLANT
TUBING CIL FLEX 10 FLL-RA (TUBING) ×2 IMPLANT

## 2017-10-07 NOTE — Interval H&P Note (Signed)
History and Physical Interval Note:  10/03/2017 8:14 AM  Daniel Rivers  has presented today for surgery, with the diagnosis of STEMI  The various methods of treatment have been discussed with the patient and family. After consideration of risks, benefits and other options for treatment, the patient has consented to  Procedure(s): LEFT HEART CATH AND CORONARY ANGIOGRAPHY (N/A) as a surgical intervention .  The patient's history has been reviewed, patient examined, no change in status, stable for surgery.  I have reviewed the patient's chart and labs.  Questions were answered to the patient's satisfaction.    2016 Appropriate Use Criteria for Coronary Revascularization in Patients With Acute Coronary Syndrome STEMI Revascularization Strategy Time since STEMI symptom onset Link Here: [email or web address] Patient Information: Revascularization Strategy Immediate Revascularization by PCI  Time since STEMI symptom onset <=12 hours  Indication:  Revascularization of the Presumed STEMI Culprit Artery by Primary PCI <=12 hours after symptom onset A (9) Indication: 1; Score 9    Riddhi Grether J [place] Paiz

## 2017-10-07 NOTE — Progress Notes (Signed)
Notified M. Patwardhan, MD of ST elevation in V1-V3 EKG leads for pt. Pt is currently without any chest pain and is asymptomatic other than a slight feeling of being "short of breath", (the patient verbalized he did not want to be in the chair, and felt his stomach girth was "interfering with breathing.") helped patient safely back to bed. Sent a text picture of the EKG to Patwardhan, who declined initiating a code STEMI and stated he would be in very soon to check on the patient. The patient's vital signs were stable: BP 104/78, HR 125 (he had been tachycardic all night w/o symptoms & MD aware), on room air sats 95%. MD also mentioned if we drew troponins they would not change course of management. Pt is being closely monitored and awaiting MD's arrival. Will continue to monitor.

## 2017-10-07 NOTE — Progress Notes (Signed)
CRITICAL VALUE ALERT  Critical Value:  Lactic acid 2.4  Date & Time Notied:  10/29/2017 0846  Provider Notified: N/A  Orders Received/Actions taken: None, results expected per previous results.   Joellen Jersey, RN

## 2017-10-07 NOTE — Progress Notes (Signed)
Morning EKG shows ST elevation in V1-V3, although with no reciprocal changes. He is short of breath, albeit without chest pain. Only definitive evaluation will be to repeat coronary angiography. I have discussed the risks, benefits with him and he agrees with proceeding.  Nigel Mormon, MD [facility] [place] Pager: [phone] Office: [phone] If no answer Cell [phone]

## 2017-10-07 NOTE — Progress Notes (Signed)
NGT removed per Dr. Virgina Jock.  Joellen Jersey, RN

## 2017-10-07 NOTE — Progress Notes (Signed)
Subjective:  Continues to have abdominal distension Short of breath and tachycardiac this mornig  EKG showed STE V1-V3 Cath showed patent stents.  Objective:  Vital Signs in the last 24 hours: Temp:  [97.9 F (36.6 C)-98.6 F (37 C)] 97.9 F (36.6 C) (09/05 0700) Pulse Rate:  [0-127] 110 (09/05 0836) Resp:  [10-29] 10 (09/05 0836) BP: (89-133)/(41-88) 116/75 (09/05 0836) SpO2:  [91 %-100 %] 94 % (09/05 0836) Weight:  [115.3 kg] 115.3 kg (09/05 0600)  Intake/Output from previous day: 09/04 0701 - 09/05 0700 In: 33 [P.O.:980] Out: 1275 [Urine:675; Emesis/NG output:600] Intake/Output from this shift: Total I/O In: -  Out: 101 [Emesis/NG output:100; Stool:1]  Physical Exam: Constitutional: He is oriented to person, place, and time. He appears well-developed and well-nourished. He appears in no distress  HENT:  Head: Atraumatic.  Eyes: Conjunctivae are normal.  Neck: Neck supple. No JVD present.  Cardiovascular: Normal rate, regular rhythm and normal heart sounds. Tachycardic No murmur heard. Pulses:      Carotid pulses are 3+ on the right side, and 3+ on the left side.      Radial pulses are 3+ on the right side, and 3+ on the left side.       Femoral pulses are 3+ on the right side, and 3+ on the left side.      Popliteal pulses are 1+ on the right side, and 1+ on the left side.       Dorsalis pedis pulses are 0 on the right side, and 0 on the left side.       Posterior tibial pulses are 2+ on the right side, and 2+ on the left side.  Abdominal: Soft. Bowel sounds are normal.  Abdominal distension improved. No tenderness.  Musculoskeletal: He exhibits no edema or deformity.  Lymphadenopathy:    He has no cervical adenopathy.  Neurological: He is alert and oriented to person, place, and time.  Skin: Skin is warm and dry.  Psychiatric: He has a normal mood and affect.   Lab Results: Recent Labs    10/11/2017 1146 10/27/2017 1241 10/06/17 0331  WBC 10.2  --  9.9  HGB  13.9 12.6* 13.7  PLT 254  --  243   Recent Labs    10/22/2017 1146 10/22/2017 1241 10/06/17 0331  NA 135 122* 135  K 4.4 4.0 4.5  CL 101 88* 102  CO2 21*  --  22  GLUCOSE 215* 182* 176*  BUN 33* 31* 25*  CREATININE 1.50* 0.90 0.88   Recent Labs    10/04/2017 1146 10/06/17 0331  TROPONINI 0.03* 26.74*   Hepatic Function Panel Recent Labs    10/23/2017 1146  PROT 6.0*  ALBUMIN 3.0*  AST 31  ALT 25  ALKPHOS 47  BILITOT 1.8*   Recent Labs    10/06/17 0331  CHOL 96    Cardiac Studies: EKG 10/06/2017: Sinus tachycardia 100 bpm. Age indeterminate anrteroseptal and inferior infarct. ST elevation improved compared to previous EKG.   Echocardiogram 10/30/2017: Study Conclusions  - Left ventricle: The cavity size was normal. There was mild   concentric hypertrophy. Systolic function was moderately reduced.   The estimated ejection fraction was in the range of 35% to 40%.   Severe hypokinesis of the anteroseptal and apical myocardium.   Features are consistent with a pseudonormal left ventricular   filling pattern, with concomitant abnormal relaxation and   increased filling pressure (grade 2 diastolic dysfunction). - Aortic valve: Mildly calcified annulus. - No  hemodynamically significant valvular abnormality.   No evidence of pulmonary hypertension.   LM: Normal LAD: Prox LAD severely calcified 80% stenosis (Non-culprit)         Mid LAD 100% ISR ([place] stenosis) LCx: Mild prox disease RCA: Patent prox-mid stent. Focal 50% restenosis  Successful PTCA and stent placement, plus aspiration thrombectomy  RA: 12 mmHg PA: 34/22 mmHg Mean PA 27 mmHg PW: 17 mmHg  LV 84/9 mmHg, LVEDP 16 mmHg  CO: 6.1 L/min CI 2.5 L/min/m2  No cardiogenic shock  Cath 10/23/2017: LM: Normal LAD: Prox LAD calcified 80% stenosis. Stable, unchanged compared to cath on 09/03          Patent mid LAD stents, no significant restenosis LCx: Mild prox disease RCA: Mild prox  ISR  No changes compared to post PCI angiographic images on 09/03 LVEDP 8 mmHg EKG changes possible related to post MI LV aneurysmal changes.  Recommendation: Continue aspirin 81 mg, Effient 10 mg daily. Continue workup for patient's abdominal distension.  Assessment/Plan:  67 year old Caucasian male with morbid obesity, hypertension, type 2 diabetes mellitus, on chronic narcotic medications for back pain, CAD status post prior LAD and RCA PCI, admitted with anterior STEMI 09/03 s/p PCI  STEMI:  Culprit vessel mid LAD s/p aspiration thrombectomy and PCI Synergy DES 3.0 X 12 mm. Residual prox LAD severely calcified 80% stenosis. Repeat cath 09/05 due to STE V1-V3. Patent mid LAD Stents with no stent thrombosis. STE possibly due to LV aneurysmal changes. Conitnue aspirin/Effient  HFrEF: Post MI. EF 35-40% EF 35-40%. Continue losartan to 25 mg. He is not in shock, as LVEDP is low at 8 mmHg. Suspect tachycardia is at least partially due to abdominal pathology. Await beta blocker introduction for now,   Tachycardia: Not in shock as demonstrated by RHC on 09./03, and LVEDP of 8 mmHg on 09/05. Suspect tachycardia is at least partially due to abdominal pathology. GIvne low LVEDP, will hydrate him today with NS 125/hr for 8 hrs.  Abdominal distension: No SBO on abdominal Xray. US abdomen shows only mild ascites. However, abdominal distension persists. In light of peristnet lactic acid elevation without sepsis criteria, will obtain CT abdomen with contrast.   Type 2 DM:  Controlled  Ok to transfer to stepdown unit.    LOS: 2 days    Janelly Switalski J Jace Fermin 10/06/2017, 8:58 AM  [place], MD [facility]. [place] Pager: [phone] Office: [phone] If no answer Cell [phone]

## 2017-10-07 NOTE — Plan of Care (Signed)

## 2017-10-07 NOTE — Consult Note (Signed)
Reason for Consult: Abdominal distension Referring Physician: Drs. 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  He states that he started to have some chest discomfort this past Saturday and progressively he worsened.  The patient's left arm became number and he was not improving.  As a result of his symptoms he presented to the hospital in cardiogenic shock and he underwent an emergent cardiac catherization.  The procedure was successful and his hemodynamic parameters improved, but it was clear his his EF had declined.  Over the past several months he complained about abdominal distension and "tightness".  Subjectively he felt that it was difficult to breathe.  In fact, during [place] interview he felt the same difficulty with breathing, but his pulse ox was >95% on room air and he was not in any distress.  At home he was well with his bowel regimen of Miralax and stool softeners.  Consistently he had bowel movements that varied in volume, but he as not able to have a reduction of his abdominal distension.  In the hospital an NG tube was placed, which helped initially, however, he feels that it is more bothersome.  Abdominal imaging with a KUB was negative for any obstruction, but there was a small amount of ascites.  A lactic acid level today was elevated at 2.4.  Past Medical History:  Diagnosis Date  . Ankylosing spondylitis of cervical region ([place])   . Ankylosing spondylitis of lumbar region ([place])   . Anxiety   . BPH (benign prostatic hypertrophy)   . Bursitis, trochanteric   . Chronic back pain   . Chronic cervical pain   . Coronary artery disease   . Daily headache    "recently; related to RX for my  heart" (05/21/2014)  . Depression   . GERD (gastroesophageal reflux disease)   . Hemochromatosis   . High cholesterol   . History of hiatal hernia   . Hypertension   . 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 Obesity   . Osteoarthritis    "joints" (05/20/2104)  . Rotator cuff syndrome   . Tendonitis   . Type II diabetes mellitus ([place])     Past Surgical History:  Procedure Laterality Date  . ANAL FISSURE REPAIR    . ANKLE FRACTURE SURGERY Left   . BACK SURGERY    . BONE TISSUE     "I've had removal of excess tissue as a result of my ankylosing spondylitis"  . CARDIAC CATHETERIZATION N/A 06/07/2014   Procedure: Coronary Stent Intervention;  Surgeon: Adrian Prows, MD;  Location: [place] [facility];  Service: Cardiovascular;  Laterality: N/A;  . CARPAL TUNNEL RELEASE Bilateral   . COLON SURGERY    . CORONARY ANGIOPLASTY WITH STENT PLACEMENT  2009; 05/22/2014   ?3; ?1  . CORONARY STENT INTERVENTION N/A 10/13/2017   Procedure: CORONARY STENT INTERVENTION;  Surgeon: Nigel Mormon, MD;  Location: [place] [facility];  Service: Cardiovascular;  Laterality: N/A;  . CORONARY THROMBECTOMY N/A 10/24/2017   Procedure: Coronary Thrombectomy;  Surgeon: Nigel Mormon, MD;  Location: [place] [facility];  Service: Cardiovascular;  Laterality: N/A;  . cortisone injection    . FRACTURE SURGERY    . I&D  EXTREMITY Left 06/29/2017   Procedure: IRRIGATION AND DEBRIDEMENT EXTREMITY, LEFT FIFTH DIGIT AMPUTATION;  Surgeon: Iran Planas, MD;  Location: [place];  Service: Orthopedics;  Laterality: Left;  . LEFT HEART CATH AND CORONARY ANGIOGRAPHY N/A 10/27/2017   Procedure: LEFT HEART CATH AND CORONARY ANGIOGRAPHY;  Surgeon: Nigel Mormon, MD;  Location: [place] [facility];  Service: Cardiovascular;  Laterality: N/A;  . LEFT HEART CATH AND CORONARY ANGIOGRAPHY N/A 10/03/2017   Procedure: LEFT HEART CATH AND CORONARY ANGIOGRAPHY;  Surgeon: Nigel Mormon, MD;  Location:  [place] [facility];  Service: Cardiovascular;  Laterality: N/A;  . LEFT HEART CATHETERIZATION WITH CORONARY ANGIOGRAM N/A 05/22/2014   Procedure: LEFT HEART CATHETERIZATION WITH CORONARY ANGIOGRAM;  Surgeon: Adrian Prows, MD;  Location[facility];  Service: Cardiovascular;  Laterality: N/A;  . LUMBAR DISC SURGERY  X 2   "ruptured/herniated/growing together"  . PERCUTANEOUS CORONARY STENT INTERVENTION (PCI-S)  05/22/2014   Procedure: PERCUTANEOUS CORONARY STENT INTERVENTION (PCI-S);  Surgeon: Adrian Prows, MD;  Location: [facility] CATH LAB;  Service: Cardiovascular;;  mid LAD DES  . PERCUTANEOUS CORONARY STENT INTERVENTION (PCI-S)  06/07/2014   des to rca  . RIGHT HEART CATH N/A 10/08/2017   Procedure: RIGHT HEART CATH;  Surgeon: Nigel Mormon, MD;  Location: [place] [facility] LAB;  Service: Cardiovascular;  Laterality: N/A;  . SHOULDER ARTHROSCOPY W/ ROTATOR CUFF REPAIR Bilateral   . SMALL INTESTINE SURGERY  1960   "took out 14 inches"  . ULNAR TUNNEL RELEASE Left     No family history on file.  Social History:  reports that he quit smoking about 31 years ago. His smoking use included cigarettes. He has a 115.00 pack-year smoking history. He has never used smokeless tobacco. He reports that he drinks alcohol. He reports that he does not use drugs.  Allergies:  Allergies  Allergen Reactions  . Amlodipine Besy-Benazepril Hcl Cough  . Brilinta [Ticagrelor] Cough  . Naproxen Other (See Comments)    Worsens Tinnitus    Medications:  Scheduled: . aspirin EC  81 mg Oral Daily  . atorvastatin  40 mg Oral q1800  . bisacodyl  10 mg Oral QHS  . buPROPion  300 mg Oral Daily  . Chlorhexidine Gluconate Cloth  6 each Topical Q0600  . docusate sodium  100 mg Oral Daily  . heparin injection (subcutaneous)  5,000 Units Subcutaneous Q8H  . Influenza vac split quadrivalent PF  0.5 mL Intramuscular Tomorrow-1000  . insulin aspart  0-15 Units Subcutaneous TID WC  . insulin aspart  4 Units Subcutaneous TID WC   . iopamidol      . iopamidol      . losartan  25 mg Oral Daily  . mupirocin ointment  1 application Nasal BID  . naloxegol oxalate  12.5 mg Oral Daily  . polyethylene glycol  17 g Oral BID  . prasugrel  10 mg Oral Daily  . psyllium  1 packet Oral BID  . sodium chloride flush  3 mL Intravenous Q12H  . sodium chloride flush  3 mL Intravenous Q12H  . zolpidem  5 mg Oral QHS   Continuous: . sodium chloride Stopped (10/27/2017 1212)  . sodium chloride    . sodium chloride Stopped (10/07/17 1148)  . sodium chloride      Results for orders placed or performed during the hospital encounter of 10/04/2017 (from the past 24 hour(s))  Glucose, capillary     Status: Abnormal   Collection Time: 10/06/17  4:25 PM  Result Value Ref Range   Glucose-Capillary 178 (H) 70 - 99 mg/dL   Comment 1 Notify RN   Glucose, capillary     Status: Abnormal   Collection Time: 10/27/2017 12:13 AM  Result Value Ref Range   Glucose-Capillary 191 (H) 70 - 99 mg/dL   Comment 1 Capillary Specimen   Lactic acid, plasma     Status: Abnormal   Collection Time: 10/19/2017  2:40 AM  Result Value Ref Range   Lactic Acid, Venous 2.6 (HH) 0.5 - 1.9 mmol/L  Brain natriuretic peptide     Status: Abnormal   Collection Time: 10/22/2017  2:40 AM  Result Value Ref Range   B Natriuretic Peptide 135.9 (H) 0.0 - 100.0 pg/mL  Glucose, capillary     Status: Abnormal   Collection Time: 10/06/2017  7:00 AM  Result Value Ref Range   Glucose-Capillary 212 (H) 70 - 99 mg/dL   Comment 1 Capillary Specimen   Lactic acid, plasma     Status: Abnormal   Collection Time: 10/11/2017  7:31 AM  Result Value Ref Range   Lactic Acid, Venous 2.4 (HH) 0.5 - 1.9 mmol/L  CBC     Status: None   Collection Time: 10/03/2017 10:26 AM  Result Value Ref Range   WBC 10.0 4.0 - 10.5 K/uL   RBC 4.53 4.22 - 5.81 MIL/uL   Hemoglobin 13.2 13.0 - 17.0 g/dL   HCT 39.8 39.0 - 52.0 %   MCV 87.9 78.0 - 100.0 fL   MCH 29.1 26.0 - 34.0 pg   MCHC 33.2 30.0 - 36.0 g/dL    RDW 13.8 11.5 - 15.5 %   Platelets 209 150 - 400 K/uL  Glucose, capillary     Status: Abnormal   Collection Time: 10/22/2017 12:33 PM  Result Value Ref Range   Glucose-Capillary 159 (H) 70 - 99 mg/dL   Comment 1 Capillary Specimen      US Abdomen Complete  Result Date: 10/10/2017 CLINICAL DATA:  Abdomen distension EXAM: ABDOMEN ULTRASOUND COMPLETE COMPARISON:  Ultrasound 09/12/2015, 09/06/2013 FINDINGS: Gallbladder: No gallstones or wall thickening visualized. No sonographic Murphy sign noted by sonographer. Common bile duct: Diameter: 4.5 mm Liver: Nodular contour with heterogeneous echotexture consistent with cirrhosis. Diffuse increased echogenicity with probable fatty sparing near the gallbladder fossa. Portal vein is patent on color Doppler imaging with normal direction of blood flow towards the liver. IVC: No abnormality visualized. Pancreas: Poorly visualized due to gas. Spleen: Size and appearance within normal limits. Right Kidney: Length: 12.4 cm. Cortical echogenicity within normal limits. No hydronephrosis. Small cyst at the mid to lower pole measuring 1.7 cm. Left Kidney: Length: 11.7 cm. Cortical echogenicity within normal limits. 1.4 cm hypoechoic lesion upper pole left kidney, indeterminate. Abdominal aorta: No aneurysm visualized.  Maximum diameter 2.6 cm. Other findings: Small amount of ascites. IMPRESSION: 1. Cirrhosis of the liver.  Small amount of abdominal ascites. 2. Negative for gallstones or biliary dilatation 3. 1.4 cm hypoechoic lesion upper pole left kidney, complex cystic versus small solid nodule. Could further evaluate with dedicated renal CT or MRI when clinically feasible. Electronically Signed   By: Donavan Foil M.D.   On: 10/17/2017 23:51   Dg Chest Port 1 View  Result Date: 10/06/2017 CLINICAL DATA:  Sob today,hx NG present ,abd distention too EXAM: PORTABLE CHEST 1 VIEW COMPARISON:  Chest x-ray dated 10/05/2017. FINDINGS: Study is hypoinspiratory with crowding of  the perihilar and bibasilar bronchovascular markings, similar to previous exam. No new lung findings. No  pleural effusion or pneumothorax seen. Heart size and mediastinal contours appear stable. NG tube passes below the diaphragm. IMPRESSION: Low lung volumes, similar to previous study. No new findings. No evidence of pneumonia or pulmonary edema. Electronically Signed   By: Franki Cabot M.D.   On: 10/22/2017 10:45   Dg Abd Portable 1v  Result Date: 10/09/2017 CLINICAL DATA:  NG tube placement EXAM: PORTABLE ABDOMEN - 1 VIEW COMPARISON:  10/15/2017 FINDINGS: Insertion of esophageal tube, tip and side port overlie the gastric body. Decreased gastric distention. Otherwise nonobstructed gas pattern. Contrast within the bladder. IMPRESSION: Esophageal tube tip overlies the gastric body. There is decreased gastric distention. Electronically Signed   By: Donavan Foil M.D.   On: 10/27/2017 20:21   Dg Abd Portable 1v  Result Date: 10/22/2017 CLINICAL DATA:  Abdomen distension EXAM: PORTABLE ABDOMEN - 1 VIEW COMPARISON:  Chest x-ray 10/21/2017 FINDINGS: Central gas collection presumably represents air-filled dilated stomach. Remaining gas pattern nonobstructed. Radiopaque contrast within the bladder. IMPRESSION: Overall nonobstructed gas pattern. Prominent central gas collection presumably reflects moderate gaseous enlargement of the stomach Electronically Signed   By: Donavan Foil M.D.   On: 10/13/2017 19:30    ROS:  As stated above in the HPI otherwise negative.  Blood pressure 101/64, pulse (!) 110, temperature 98 F (36.7 C), temperature source Oral, resp. rate (!) 21, height 6\' 4"  (1.93 m), weight 115.3 kg, SpO2 96 %.    PE: Gen: NAD, Alert and Oriented HEENT:  [place]/AT, EOMI Neck: Supple, no LAD Lungs: CTA Bilaterally CV: RRR without M/G/R ABM: Soft, distended, tympanic, +BS Ext: No C/C/E  Assessment/Plan: 1) Abdominal distension. 2) Cirrhosis. 3) S/p STEMI with stent placement in the mid  LAD.   The abdominal distension is evident with the physical examination, but the source is not clear.  The initial KUB was positive for gastric distension, but this issue has resolved with the NG tube.  Clinically he still feels distended and there is tympany with the examination.  It is possible that the lower EF or the acuity of the situation, which may have been building for the past couple of months, has resulted in this abdominal distension.  Improvement in his EF, over the short an long term, may resolve this issue.  Currently there is no evidence of constipation as he is able to have bowel movements, in spite of his high narcotic dosing.  During the evaluation today, he demonstrated that he was able to have a bowel movement.  Plan: 1) Agree with the CT of the abdomen to ensure no other abnormalities or etiologies for the distension. 2) Continue with the NG tube for now. 3) Continue with the current laxative regimen. Marvel Mcphillips D 10/10/2017, 12:47 PM

## 2017-10-07 NOTE — Progress Notes (Signed)
Dr. Benson Norway aware of patient's CT abdomen results. States to maintain NGT and LIWS for now. Also maintain NPO diet, but sips with meds is appropriate. Will continue to monitor.  Joellen Jersey, RN

## 2017-10-07 NOTE — Progress Notes (Signed)
This RN in patient's room at [place], pt c/o worsening back pain and shortness of breath. Vital signs stable. Morning EKG had shown ST elevation. Dr. Virgina Jock at bedside and aware of the above. Patient going to cath lab.  Joellen Jersey, RN

## 2017-10-07 NOTE — H&P (View-Only) (Signed)
Morning EKG shows ST elevation in V1-V3, although with no reciprocal changes. He is short of breath, albeit without chest pain. Only definitive evaluation will be to repeat coronary angiography. I have discussed the risks, benefits with him and he agrees with proceeding.  Nigel Mormon, MD [facility]. [place] Pager: [phone] Office: [phone] If no answer Cell [phone]

## 2017-10-07 NOTE — Progress Notes (Signed)
EKG CRITICAL VALUE     12 lead EKG performed.  Critical value noted.  Iverson Alamin, RN notified.   Asencion Gowda, CCT 10/03/2017 6:56 AM

## 2017-10-08 ENCOUNTER — Inpatient Hospital Stay (HOSPITAL_COMMUNITY): Payer: Medicare Other

## 2017-10-08 DIAGNOSIS — R0602 Shortness of breath: Secondary | ICD-10-CM

## 2017-10-08 DIAGNOSIS — I2109 ST elevation (STEMI) myocardial infarction involving other coronary artery of anterior wall: Secondary | ICD-10-CM

## 2017-10-08 DIAGNOSIS — R140 Abdominal distension (gaseous): Secondary | ICD-10-CM

## 2017-10-08 DIAGNOSIS — E440 Moderate protein-calorie malnutrition: Secondary | ICD-10-CM

## 2017-10-08 DIAGNOSIS — R188 Other ascites: Secondary | ICD-10-CM

## 2017-10-08 LAB — BODY FLUID CELL COUNT WITH DIFFERENTIAL
EOS FL: 0 %
LYMPHS FL: 32 %
Monocyte-Macrophage-Serous Fluid: 50 % (ref 50–90)
NEUTROPHIL FLUID: 18 % (ref 0–25)
WBC FLUID: 1161 uL — AB (ref 0–1000)

## 2017-10-08 LAB — CBC
HEMATOCRIT: 43.4 % (ref 39.0–52.0)
HEMOGLOBIN: 14.2 g/dL (ref 13.0–17.0)
MCH: 28.7 pg (ref 26.0–34.0)
MCHC: 32.7 g/dL (ref 30.0–36.0)
MCV: 87.7 fL (ref 78.0–100.0)
PLATELETS: 279 10*3/uL (ref 150–400)
RBC: 4.95 MIL/uL (ref 4.22–5.81)
RDW: 13.8 % (ref 11.5–15.5)
WBC: 11 10*3/uL — AB (ref 4.0–10.5)

## 2017-10-08 LAB — BASIC METABOLIC PANEL
ANION GAP: 12 (ref 5–15)
BUN: 12 mg/dL (ref 8–23)
CALCIUM: 8.4 mg/dL — AB (ref 8.9–10.3)
CO2: 26 mmol/L (ref 22–32)
Chloride: 96 mmol/L — ABNORMAL LOW (ref 98–111)
Creatinine, Ser: 0.97 mg/dL (ref 0.61–1.24)
GFR calc non Af Amer: >60 mL/min (ref >60)
Glucose, Bld: 169 mg/dL — ABNORMAL HIGH (ref 70–99)
Potassium: 3.8 mmol/L (ref 3.5–5.1)
SODIUM: 134 mmol/L — AB (ref 135–145)

## 2017-10-08 LAB — GRAM STAIN

## 2017-10-08 LAB — LACTIC ACID, PLASMA: LACTIC ACID, VENOUS: 3.1 mmol/L — AB (ref 0.5–1.9)

## 2017-10-08 LAB — TSH: TSH: 0.812 u[IU]/mL (ref 0.350–4.500)

## 2017-10-08 LAB — ECHOCARDIOGRAM LIMITED
Height: 76 in
WEIGHTICAEL: 4067.2 [oz_av]

## 2017-10-08 LAB — GLUCOSE, CAPILLARY
Glucose-Capillary: 171 mg/dL — ABNORMAL HIGH (ref 70–99)
Glucose-Capillary: 208 mg/dL — ABNORMAL HIGH (ref 70–99)
Glucose-Capillary: 232 mg/dL — ABNORMAL HIGH (ref 70–99)

## 2017-10-08 LAB — ALBUMIN, PLEURAL OR PERITONEAL FLUID: ALBUMIN FL: 1.8 g/dL

## 2017-10-08 LAB — PROTEIN, PLEURAL OR PERITONEAL FLUID: TOTAL PROTEIN, FLUID: 3 g/dL

## 2017-10-08 LAB — TROPONIN I: Troponin I: 4.5 ng/mL (ref <0.03)

## 2017-10-08 LAB — LACTATE DEHYDROGENASE, PLEURAL OR PERITONEAL FLUID: LD FL: 133 U/L — AB (ref 3–23)

## 2017-10-08 MED ORDER — ALBUMIN HUMAN 25 % IV SOLN
12.5000 g | Freq: Once | INTRAVENOUS | Status: AC
  Administered 2017-10-08: 12.5 g via INTRAVENOUS
  Filled 2017-10-08: qty 50

## 2017-10-08 MED ORDER — PRO-STAT SUGAR FREE PO LIQD
30.0000 mL | Freq: Every day | ORAL | Status: DC
  Administered 2017-10-09 – 2017-10-13 (×5): 30 mL via ORAL
  Filled 2017-10-08 (×5): qty 30

## 2017-10-08 MED ORDER — LOSARTAN POTASSIUM 25 MG PO TABS
12.5000 mg | ORAL_TABLET | Freq: Every day | ORAL | Status: DC
  Administered 2017-10-08 – 2017-10-09 (×2): 12.5 mg via ORAL
  Filled 2017-10-08 (×2): qty 1

## 2017-10-08 MED ORDER — IOPAMIDOL (ISOVUE-370) INJECTION 76%
100.0000 mL | Freq: Once | INTRAVENOUS | Status: AC | PRN
  Administered 2017-10-08: 100 mL via INTRAVENOUS

## 2017-10-08 MED ORDER — FUROSEMIDE 20 MG PO TABS
20.0000 mg | ORAL_TABLET | Freq: Every day | ORAL | Status: DC
  Administered 2017-10-08 – 2017-10-09 (×2): 20 mg via ORAL
  Filled 2017-10-08 (×2): qty 1

## 2017-10-08 MED ORDER — PERFLUTREN LIPID MICROSPHERE
4.0000 mL | Freq: Once | INTRAVENOUS | Status: AC
  Administered 2017-10-08: 4 mL via INTRAVENOUS

## 2017-10-08 MED ORDER — GLUCERNA SHAKE PO LIQD
237.0000 mL | Freq: Three times a day (TID) | ORAL | Status: DC
  Administered 2017-10-08 – 2017-10-13 (×9): 237 mL via ORAL

## 2017-10-08 MED ORDER — SODIUM CHLORIDE 0.9 % IV SOLN
2.0000 g | INTRAVENOUS | Status: DC
  Administered 2017-10-08 – 2017-10-10 (×3): 2 g via INTRAVENOUS
  Filled 2017-10-08 (×5): qty 20

## 2017-10-08 MED ORDER — IOPAMIDOL (ISOVUE-370) INJECTION 76%
INTRAVENOUS | Status: AC
  Filled 2017-10-08: qty 100

## 2017-10-08 MED ORDER — FUROSEMIDE 10 MG/ML IJ SOLN
20.0000 mg | Freq: Once | INTRAMUSCULAR | Status: AC
  Administered 2017-10-08: 20 mg via INTRAVENOUS
  Filled 2017-10-08: qty 2

## 2017-10-08 MED FILL — Nitroglycerin IV Soln 100 MCG/ML in D5W: INTRA_ARTERIAL | Qty: 10 | Status: AC

## 2017-10-08 NOTE — Consult Note (Signed)
Name: Daniel Rivers MRN: [id number] DOB: May 03, 1950    ADMISSION DATE:  10/24/2017 CONSULTATION DATE: October 08, 2017  REFERRING MD : Cardiology services  CHIEF COMPLAINT: Pain and shortness of breath abdominal distention  BRIEF PATIENT DESCRIPTION: Patient with severe abdominal distention and shortness of breath  SIGNIFICANT EVENTS  CT PE protocol is negative  STUDIES:  Abdominal CT with abdominal mass and ascites   HISTORY OF PRESENT ILLNESS: Patient with coronary artery disease status post intervention if 25 to 30% complains of abdominal pain lactic acidosis shortness of breath abdominal distention will code by the cardiology service to manage that to see if he needs ICU or not considering me that 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  He had his father for the same diagnosis liver cirrhosis before he had a CAT scan yesterday which showed abdominal mass we are consulted for progressive shortness of breath and lactic acidosis patient seemed to be an abdominal compartment syndrome.  Patient is denying alcohol abuse hepatitis C HIV he has been complaining of abdominal distention for the past year and a half.  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  has a past surgical history that includes cortisone injection; Small intestine surgery (1960); Back surgery; Ulnar tunnel release (Left); BONE TISSUE; Anal fissure repair; Shoulder  arthroscopy w/ rotator cuff repair (Bilateral); Colon surgery; Carpal tunnel release (Bilateral); Ankle fracture surgery (Left); Lumbar disc surgery (X 2); Fracture surgery; Coronary angioplasty with stent (2009; 05/22/2014); left heart catheterization with coronary angiogram (N/A, 05/22/2014); percutaneous coronary stent intervention (pci-s) (05/22/2014); Cardiac catheterization (N/A, 06/07/2014); Percutaneous coronary stent intervention (pci-s) (06/07/2014); I&D extremity (Left, 06/29/2017); LEFT HEART CATH AND CORONARY ANGIOGRAPHY (N/A, 11/01/2017); CORONARY STENT INTERVENTION (N/A, 10/27/2017); RIGHT HEART CATH (N/A, 10/10/2017); Coronary Thrombectomy (N/A, 10/26/2017); and LEFT HEART CATH AND CORONARY ANGIOGRAPHY (N/A, 10/10/2017). Prior to Admission medications   Medication Sig Start Date End Date Taking? Authorizing Provider  aspirin 81 MG tablet Take 81 mg by mouth daily.    Yes [provider]  bisacodyl (DULCOLAX) 5 MG EC tablet Take 10 mg by mouth at bedtime.    Yes [provider]  buPROPion (WELLBUTRIN XL) 300 MG 24 hr tablet Take 300 mg by mouth daily.  04/05/15  Yes [provider]  carvedilol (COREG) 3.125 MG tablet Take 3.125 mg by mouth 2 (two) times daily.   Yes [provider]  Cholecalciferol (VITAMIN D3) 2000 UNITS TABS Take 1 tablet by mouth daily.   Yes [provider]  diclofenac (VOLTAREN) 75 MG EC tablet Take 1 tablet (75 mg total) by mouth 2 (two) times daily with a meal. As needed Patient taking differently: Take 75 mg by mouth 2 (two) times daily as needed (hip pain).  10/07/16  Yes Meredith Staggers, MD  furosemide (LASIX) 20 MG tablet Take 20 mg by mouth as needed for fluid.  09/26/12  Yes [provider]  glimepiride (AMARYL) 2 MG tablet Take 3 mg by mouth daily before breakfast.    Yes [provider]  KRILL OIL PO Take 1 tablet by mouth daily.  Yes [provider]  losartan (COZAAR) 100 MG tablet Take 50 mg by mouth  daily.    Yes [provider]  Magnesium Oxide 400 MG CAPS Take 400 mg by mouth daily.    Yes [provider]  metFORMIN (GLUCOPHAGE) 1000 MG tablet Take 1,000 mg by mouth every 12 (twelve) hours.   Yes [provider]  morphine (MS CONTIN) 60 MG 12 hr tablet Take 1 tablet (60 mg total) by mouth 3 (three) times daily. 08/02/17  Yes Bayard Hugger, NP  Multiple Vitamin (MULTIVITAMIN) capsule Take 1 capsule by mouth daily. NO IRON   Yes [provider]  NITROSTAT 0.4 MG SL tablet Place 0.4 mg under the tongue every 5 (five) minutes as needed for chest pain.  11/16/11  Yes [provider]  polyethylene glycol powder (GLYCOLAX/MIRALAX) powder Using measuring cap mix  17gm in water and drink two times daily Patient taking differently: Take 17 g by mouth 2 (two) times daily.  06/19/13  Yes Meredith Staggers, MD  pravastatin (PRAVACHOL) 80 MG tablet Take 80 mg by mouth at bedtime.    Yes [provider]  Psyllium (METAMUCIL PO) Take 17 g by mouth 2 (two) times daily.    Yes [provider]  zolpidem (AMBIEN) 10 MG tablet Take 10 mg by mouth at bedtime.    Yes [provider]  oxyCODONE (OXY IR/ROXICODONE) 5 MG immediate release tablet Take 1 tablet (5 mg total) by mouth every 6 (six) hours as needed for severe pain. Patient not taking: Reported on 10/27/2017 08/02/17   Bayard Hugger, NP   Allergies  Allergen Reactions  . Amlodipine Besy-Benazepril Hcl Cough  . Brilinta [Ticagrelor] Cough  . Naproxen Other (See Comments)    Worsens Tinnitus    FAMILY HISTORY:  family history is not on file. SOCIAL HISTORY:  reports that he quit smoking about 31 years ago. His smoking use included cigarettes. He has a 115.00 pack-year smoking history. He has never used smokeless tobacco. He reports that he drinks alcohol. He reports that he does not use drugs.  REVIEW OF SYSTEMS:   Constitutional: Negative for fever, chills, weight loss,  malaise/fatigue and diaphoresis.  HENT: Negative for hearing loss, ear pain, nosebleeds, congestion, sore throat, neck pain, tinnitus and ear discharge.   Eyes: Negative for blurred vision, double vision, photophobia, pain, discharge and redness.  Respiratory: Negative for cough, hemoptysis, sputum production, shortness of breath, wheezing and stridor.   Cardiovascular: Negative for chest pain, palpitations, orthopnea, claudication, leg swelling and PND.  Gastrointestinal: Negative for heartburn, nausea, vomiting, abdominal pain, diarrhea, constipation, blood in stool and melena.  Genitourinary: Negative for dysuria, urgency, frequency, hematuria and flank pain.  Musculoskeletal: Negative for myalgias, back pain, joint pain and falls.  Skin: Negative for itching and rash.  Neurological: Negative for dizziness, tingling, tremors, sensory change, speech change, focal weakness, seizures, loss of consciousness, weakness and headaches.  Endo/Heme/Allergies: Negative for environmental allergies and polydipsia. Does not bruise/bleed easily.  SUBJECTIVE:   VITAL SIGNS: Temp:  [97.5 F (36.4 C)-98.8 F (37.1 C)] 98.8 F (37.1 C) (09/06 1500) Pulse Rate:  [109-142] 142 (09/06 1300) Resp:  [14-25] 14 (09/06 1600) BP: (93-126)/(62-102) 114/74 (09/06 1600) SpO2:  [91 %-99 %] 97 % (09/06 1300) Weight:  [115.3 kg] 115.3 kg (09/06 0500)  PHYSICAL EXAMINATION: General:  NAD , pleasant and hungry , wants to eat . Neuro:  WNL , AOX3 , EOMI , CN II-XII intact , UL ,  LL strength is symmetrical and 5/5 HEENT:  atraumatic , no jaundice , dry mucous membranes  Cardiovascular:  Irregular irregular , ESM 2/6 in the aortic area  Lungs:  CTA bilateral , no wheezing or crackles  Abdomen: Severe abdominal distention Musculoskeletal:  WNL , normal pulses  Skin:  No rash    Recent Labs  Lab 10/28/2017 1146 10/08/2017 1241 10/06/17 0331 10/08/17 0330  NA 135 122* 135 134*  K 4.4 4.0 4.5 3.8  CL 101 88* 102  96*  CO2 21*  --  22 26  BUN 33* 31* 25* 12  CREATININE 1.50* 0.90 0.88 0.97  GLUCOSE 215* 182* 176* 169*   Recent Labs  Lab 10/06/17 0331 10/22/2017 1026 10/08/17 1436  HGB 13.7 13.2 14.2  HCT 41.0 39.8 43.4  WBC 9.9 10.0 11.0*  PLT 243 209 279   Ct Angio Chest Pe W Or Wo Contrast  Result Date: 10/08/2017 CLINICAL DATA:  Worsening shortness of breath today. EXAM: CT ANGIOGRAPHY CHEST WITH CONTRAST TECHNIQUE: Multidetector CT imaging of the chest was performed using the standard protocol during bolus administration of intravenous contrast. Multiplanar CT image reconstructions and MIPs were obtained to evaluate the vascular anatomy. CONTRAST:  181mL ISOVUE-370 IOPAMIDOL (ISOVUE-370) INJECTION 76% COMPARISON:  Portable chest obtained earlier today. Abdomen ultrasound dated 10/20/2017. FINDINGS: Cardiovascular: Normally opacified pulmonary arteries with no pulmonary arterial filling defects seen. Atheromatous calcifications, including the coronary arteries and aorta. Mediastinum/Nodes: No enlarged mediastinal, hilar, or axillary lymph nodes. Thyroid gland, trachea, and esophagus demonstrate no significant findings. Lungs/Pleura: Mild bullous changes. Bilateral lower lobe atelectasis. Moderate-sized right pleural effusion and small left pleural effusion. Upper Abdomen: Moderate to large amount of free peritoneal fluid. The included portion of the liver has mildly lobulated contours with an enlarged lateral segment left lobe. Musculoskeletal: Thoracic and lower cervical spine degenerative changes with diffuse ankylosis. Review of the MIP images confirms the above findings. IMPRESSION: 1. No pulmonary emboli. 2. Moderate-sized right pleural effusion and small left pleural effusion. 3. Bilateral lower lobe atelectasis. 4. Moderate to large amount of free peritoneal fluid. 5. Changes of cirrhosis of the liver. 6. Mild changes of COPD. 7. Atheromatous calcifications, including the coronary arteries and aorta.  Aortic Atherosclerosis (ICD10-I70.0) and Emphysema (ICD10-J43.9). Electronically Signed   By: Claudie Revering M.D.   On: 10/08/2017 18:40   Ct Abdomen W Contrast  Result Date: 10/23/2017 CLINICAL DATA:  Abdominal distension. Short of breath. Recent heart catheterization. History of small bowel resection 1960 EXAM: CT ABDOMEN WITH CONTRAST TECHNIQUE: Multidetector CT imaging of the abdomen was performed using the standard protocol following bolus administration of intravenous contrast. CONTRAST:  194mL ISOVUE-300 IOPAMIDOL (ISOVUE-300) INJECTION 61%, <See Chart> ISOVUE-300 IOPAMIDOL (ISOVUE-300) INJECTION 61% COMPARISON:  None. FINDINGS: Lower chest: Bilateral pleural effusions which are small to moderate and greater on the RIGHT. There is associated bibasilar atelectasis Hepatobiliary: Liver has a fine nodular contour. Caudate lobe is prominent. Portal veins patent. Gallbladder normal. Pancreas: Normal pancreatic parenchymal intensity. No ductal dilatation or inflammation. Spleen: Normal spleen. Adrenals/urinary tract: Small LEFT adrenal adenoma. No renal obstruction. Low-density lesions in the renal cortex likely represent benign cysts Stomach/Bowel: NG tube extends into the gastric antrum. Stomach, duodenum and limited view of the bowel and colon demonstrate no acute findings. Is moderate volume fluid in the peritoneal space along the pericolic gutters in surrounding the liver and spleen. Vascular/Lymphatic: Abdominal aortic normal caliber with intimal calcifications. There is central mesenteric mass positioned between the superior mesenteric vein and the third portion  duodenum measuring 5.1 x 3.2 cm (image 53/3). Lesion also seen on coronal image 86/6. Lesion abuts the uncinate of the pancreas. Musculoskeletal: No aggressive osseous lesion IMPRESSION: 1. Potential mass lesion in the central mesentery positioned between the SMV and second portion duodenum. Mass suggests lymphoma, gastrointestinal stromal tumor,  primary duodenal carcinoma, carcinoid tumor, atypical pancreatic carcinoma or metastatic nodal conglomerate from unknown primary. Lower abdomen and pelvis not included on exam. Consider endoscopic ultrasound (EUS) evaluation and tumor sampling. FDG PET scan may be beneficial on outpatient basis. 2. Nodule liver with ascites suggests cirrhosis. 3. Normal bilateral pleural effusions and associated atelectasis. Electronically Signed   By: Suzy Bouchard M.D.   On: 10/20/2017 13:02   Dg Chest Port 1 View  Result Date: 11/01/2017 CLINICAL DATA:  Sob today,hx NG present ,abd distention too EXAM: PORTABLE CHEST 1 VIEW COMPARISON:  Chest x-ray dated 10/16/2017. FINDINGS: Study is hypoinspiratory with crowding of the perihilar and bibasilar bronchovascular markings, similar to previous exam. No new lung findings. No pleural effusion or pneumothorax seen. Heart size and mediastinal contours appear stable. NG tube passes below the diaphragm. IMPRESSION: Low lung volumes, similar to previous study. No new findings. No evidence of pneumonia or pulmonary edema. Electronically Signed   By: Franki Cabot M.D.   On: 10/22/2017 10:45    ASSESSMENT / PLAN:  --Severe shortness of breath associated with distention of the abdomen and ascites with abdominal mass bedside paracentesis was done removing 1500 cc I could have removed more but because of the patient never had the procedure before and removed 1500 for diagnostic and therapeutic we will start the patient ceftriaxone for possible process peritonitis sent for cytology flow cytometry cell count Gram stain culture.  This point patient did not need to ICU and hopefully he will clear his lactic acid.  We will give him a dose of albumin after the paracentesis. --Left acidosis in the setting of possible occult abdominal compartment syndrome 1500 cc of peritoneal fluid was removed recheck in the morning. --Abdominal mass needs to be investigated. --Coronary artery disease  status post intervention patient does not seem to be in cardiogenic shock or septic shock most likely this is congestion liver congestion and abdominal compartment and hopefully he will start clearing his lactic acid.  Pulmonary and [place] Pager: [phone]  10/08/2017, 7:27 PM

## 2017-10-08 NOTE — Progress Notes (Signed)
Subjective: He slept much better last evening.    Objective: Vital signs in last 24 hours: Temp:  [98 F (36.7 C)-98.6 F (37 C)] 98.6 F (37 C) (09/06 0500) Pulse Rate:  [0-131] 119 (09/06 0600) Resp:  [10-25] 25 (09/06 0600) BP: (88-130)/(61-102) 120/73 (09/06 0600) SpO2:  [91 %-99 %] 96 % (09/06 0600) Weight:  [115.3 kg] 115.3 kg (09/06 0500) Last BM Date: 10/06/2017  Intake/Output from previous day: 09/05 0701 - 09/06 0700 In: 2109.4 [P.O.:1240; I.V.:869.4] Out: 926 [Urine:625; Emesis/NG output:300; Stool:1] Intake/Output this shift: No intake/output data recorded.  General appearance: alert and no distress GI: soft, non-tender; bowel sounds normal; no masses,  no organomegaly  Lab Results: Recent Labs    10/04/2017 1146 10/26/2017 1241 10/06/17 0331 10/09/2017 1026  WBC 10.2  --  9.9 10.0  HGB 13.9 12.6* 13.7 13.2  HCT 42.9 37.0* 41.0 39.8  PLT 254  --  243 209   BMET Recent Labs    10/12/2017 1146 10/20/2017 1241 10/06/17 0331 10/08/17 0330  NA 135 122* 135 134*  K 4.4 4.0 4.5 3.8  CL 101 88* 102 96*  CO2 21*  --  22 26  GLUCOSE 215* 182* 176* 169*  BUN 33* 31* 25* 12  CREATININE 1.50* 0.90 0.88 0.97  CALCIUM 9.0  --  8.9 8.4*   LFT Recent Labs    10/08/2017 1146  PROT 6.0*  ALBUMIN 3.0*  AST 31  ALT 25  ALKPHOS 47  BILITOT 1.8*   PT/INR Recent Labs    10/27/2017 1146  LABPROT 14.8  INR 1.17   Hepatitis Panel No results for input(s): HEPBSAG, HCVAB, HEPAIGM, HEPBIGM in the last 72 hours. C-Diff No results for input(s): CDIFFTOX in the last 72 hours. Fecal Lactopherrin No results for input(s): FECLLACTOFRN in the last 72 hours.  Studies/Results: Ct Abdomen W Contrast  Result Date: 11/01/2017 CLINICAL DATA:  Abdominal distension. Short of breath. Recent heart catheterization. History of small bowel resection 1960 EXAM: CT ABDOMEN WITH CONTRAST TECHNIQUE: Multidetector CT imaging of the abdomen was performed using the standard protocol following  bolus administration of intravenous contrast. CONTRAST:  126mL ISOVUE-300 IOPAMIDOL (ISOVUE-300) INJECTION 61%, <See Chart> ISOVUE-300 IOPAMIDOL (ISOVUE-300) INJECTION 61% COMPARISON:  None. FINDINGS: Lower chest: Bilateral pleural effusions which are small to moderate and greater on the RIGHT. There is associated bibasilar atelectasis Hepatobiliary: Liver has a fine nodular contour. Caudate lobe is prominent. Portal veins patent. Gallbladder normal. Pancreas: Normal pancreatic parenchymal intensity. No ductal dilatation or inflammation. Spleen: Normal spleen. Adrenals/urinary tract: Small LEFT adrenal adenoma. No renal obstruction. Low-density lesions in the renal cortex likely represent benign cysts Stomach/Bowel: NG tube extends into the gastric antrum. Stomach, duodenum and limited view of the bowel and colon demonstrate no acute findings. Is moderate volume fluid in the peritoneal space along the pericolic gutters in surrounding the liver and spleen. Vascular/Lymphatic: Abdominal aortic normal caliber with intimal calcifications. There is central mesenteric mass positioned between the superior mesenteric vein and the third portion duodenum measuring 5.1 x 3.2 cm (image 53/3). Lesion also seen on coronal image 86/6. Lesion abuts the uncinate of the pancreas. Musculoskeletal: No aggressive osseous lesion IMPRESSION: 1. Potential mass lesion in the central mesentery positioned between the SMV and second portion duodenum. Mass suggests lymphoma, gastrointestinal stromal tumor, primary duodenal carcinoma, carcinoid tumor, atypical pancreatic carcinoma or metastatic nodal conglomerate from unknown primary. Lower abdomen and pelvis not included on exam. Consider endoscopic ultrasound (EUS) evaluation and tumor sampling. FDG PET scan may be  beneficial on outpatient basis. 2. Nodule liver with ascites suggests cirrhosis. 3. Normal bilateral pleural effusions and associated atelectasis. Electronically Signed   By:  Suzy Bouchard M.D.   On: 10/21/2017 13:02   Dg Chest Port 1 View  Result Date: 10/16/2017 CLINICAL DATA:  Sob today,hx NG present ,abd distention too EXAM: PORTABLE CHEST 1 VIEW COMPARISON:  Chest x-ray dated 10/31/2017. FINDINGS: Study is hypoinspiratory with crowding of the perihilar and bibasilar bronchovascular markings, similar to previous exam. No new lung findings. No pleural effusion or pneumothorax seen. Heart size and mediastinal contours appear stable. NG tube passes below the diaphragm. IMPRESSION: Low lung volumes, similar to previous study. No new findings. No evidence of pneumonia or pulmonary edema. Electronically Signed   By: Franki Cabot M.D.   On: 10/15/2017 10:45    Medications:  Scheduled: . aspirin EC  81 mg Oral Daily  . atorvastatin  40 mg Oral q1800  . bisacodyl  10 mg Oral QHS  . buPROPion  300 mg Oral Daily  . Chlorhexidine Gluconate Cloth  6 each Topical Q0600  . docusate sodium  100 mg Oral Daily  . heparin injection (subcutaneous)  5,000 Units Subcutaneous Q8H  . Influenza vac split quadrivalent PF  0.5 mL Intramuscular Tomorrow-1000  . insulin aspart  0-15 Units Subcutaneous TID WC  . insulin aspart  4 Units Subcutaneous TID WC  . losartan  25 mg Oral Daily  . mupirocin ointment  1 application Nasal BID  . naloxegol oxalate  12.5 mg Oral Daily  . polyethylene glycol  17 g Oral BID  . prasugrel  10 mg Oral Daily  . psyllium  1 packet Oral BID  . sodium chloride flush  3 mL Intravenous Q12H  . sodium chloride flush  3 mL Intravenous Q12H  . zolpidem  5 mg Oral QHS   Continuous: . sodium chloride Stopped (10/12/2017 1212)  . sodium chloride    . sodium chloride      Assessment/Plan: 1) S/p STEMI. 2) Cirrhosis. 3) Ascites. 4) Mesenteric mass.   The patient is stable from the cardiac standpoint.  His blood pressure also appears to be improved.  There is no significant change with his subjective sensation of bloating and tightness in his  abdomen.  Plan: 1) Step I diuretics - Lasix 40 mg QD and spironolactone 100 mg QD when cleared by cardiology. 2) 2 gram sodium diet.  He already adheres to a very low salt diet at home. 3) EUS with FNA of the mesenteric mass in the next 1-3 months.  This will be determined in future discussions with cardiology to find a safer window for holding anticoagulation in the face of a recent STEMI with stent placement.  LOS: 3 days   Anelise Staron D 10/08/2017, 7:45 AM

## 2017-10-08 NOTE — Progress Notes (Signed)
EKG CRITICAL VALUE     12 lead EKG performed.  Critical value noted.  Lattie Haw, RN notified.   Marilynne Halsted, CCT 10/08/2017 3:26 PM

## 2017-10-08 NOTE — Procedures (Signed)
Paracentesis procedure Note  Pre-operative Diagnosis: Abdominal distention-distention shortness of breath abdominal compartment syndrome  Post-operative Diagnosis: Improved  Indications: This is for abdominal compartment shortness of breath abdominal distention  Procedure Details  Consent: Informed consent was obtained. Risks of the procedure were discussed including: infection, bleeding, pain, perforation.  Under sterile conditions the patient was positioned. Betadine solution and sterile drapes were utilized.  2% buffered lidocaine was used to anesthetize right lower quadrant . Fluid was obtained without any difficulties and minimal blood loss.  A dressing was applied to the wound and wound care instructions were provided.   Findings 1500 ml of cloudy pleural fluid was obtained. A sample was sent to Pathology for cytogenetics, flow, and cell counts, as well as for infection analysis.  Complications:  None; patient tolerated the procedure well.          Condition: stable Samples were sent to the lab for cytology flow cytometry Gram stain culture albumin protein LDH

## 2017-10-08 NOTE — Progress Notes (Signed)
Subjective:  Continues to have abdominal distension Intermittent episodes of shortness of breath. although no hypoxia.   New diagnosis of abdominal mass on CT abdomnen  Objective:  Vital Signs in the last 24 hours: Temp:  [97.5 F (36.4 C)-98.6 F (37 C)] 98.3 F (36.8 C) (09/06 1155) Pulse Rate:  [106-131] 117 (09/06 1200) Resp:  [13-25] 18 (09/06 1200) BP: (93-126)/(62-102) 96/73 (09/06 1200) SpO2:  [91 %-99 %] 95 % (09/06 1200) Weight:  [115.3 kg] 115.3 kg (09/06 0500)  Intake/Output from previous day: 09/05 0701 - 09/06 0700 In: 2109.4 [P.O.:1240; I.V.:869.4] Out: 926 [Urine:625; Emesis/NG output:300; Stool:1] Intake/Output from this shift: Total I/O In: 360 [P.O.:360] Out: 400 [Urine:400]  Physical Exam: Constitutional: He is oriented to person, place, and time. He appears well-developed and well-nourished. He appears in no distress  HENT:  Head: Atraumatic.  Eyes: Conjunctivae are normal.  Neck: Neck supple. No JVD present.  Cardiovascular: Normal rate, regular rhythm and normal heart sounds. Tachycardic No murmur heard. Pulses: Intact distal pulses, except absent PT b/l Respiratory: Remains mildly tachypnic Abdominal: Soft. Bowel sounds are normal.  Abdominal distension improved. No tenderness.  Musculoskeletal: He exhibits no edema or deformity.  Lymphadenopathy:    He has no cervical adenopathy.  Neurological: He is alert and oriented to person, place, and time.  Skin: Skin is warm and dry.  Psychiatric: He has a normal mood and affect.   Lab Results: Recent Labs    10/06/17 0331 11/01/2017 1026  WBC 9.9 10.0  HGB 13.7 13.2  PLT 243 209   Recent Labs    10/06/17 0331 10/08/17 0330  NA 135 134*  K 4.5 3.8  CL 102 96*  CO2 22 26  GLUCOSE 176* 169*  BUN 25* 12  CREATININE 0.88 0.97   Recent Labs    10/06/17 0331  TROPONINI 26.74*   CT Abdomen w/contrast 10/22/2017: 1. Potential mass lesion in the central mesentery positioned between the  SMV and second portion duodenum. Mass suggests lymphoma, gastrointestinal stromal tumor, primary duodenal carcinoma, carcinoid tumor, atypical pancreatic carcinoma or metastatic nodal conglomerate from unknown primary. Lower abdomen and pelvis not included on exam. Consider endoscopic ultrasound (EUS) evaluation and tumor sampling. FDG PET scan may be beneficial on outpatient basis. 2. Nodule liver with ascites suggests cirrhosis. 3. Normal bilateral pleural effusions and associated atelectasis.  Cardiac Studies: EKG 10/06/2017: Sinus tachycardia 100 bpm. Age indeterminate anrteroseptal and inferior infarct. ST elevation improved compared to previous EKG.   Echocardiogram 10/18/2017: Study Conclusions  - Left ventricle: The cavity size was normal. There was mild   concentric hypertrophy. Systolic function was moderately reduced.   The estimated ejection fraction was in the range of 35% to 40%.   Severe hypokinesis of the anteroseptal and apical myocardium.   Features are consistent with a pseudonormal left ventricular   filling pattern, with concomitant abnormal relaxation and   increased filling pressure (grade 2 diastolic dysfunction). - Aortic valve: Mildly calcified annulus. - No hemodynamically significant valvular abnormality.   No evidence of pulmonary hypertension.   LM: Normal LAD: Prox LAD severely calcified 80% stenosis (Non-culprit)         Mid LAD 100% ISR ([place] stenosis) LCx: Mild prox disease RCA: Patent prox-mid stent. Focal 50% restenosis  Successful PTCA and stent placement, plus aspiration thrombectomy  RA: 12 mmHg PA: 34/22 mmHg Mean PA 27 mmHg PW: 17 mmHg  LV 84/9 mmHg, LVEDP 16 mmHg  CO: 6.1 L/min CI 2.5 L/min/m2  No cardiogenic shock  Cath 11/01/2017: LM: Normal LAD: Prox LAD calcified 80% stenosis. Stable, unchanged compared to cath on 09/03          Patent mid LAD stents, no significant restenosis LCx: Mild prox disease RCA: Mild  prox ISR  No changes compared to post PCI angiographic images on 09/03 LVEDP 8 mmHg EKG changes possible related to post MI LV aneurysmal changes.  Recommendation: Continue aspirin 81 mg, Effient 10 mg daily. Continue workup for patient's abdominal distension.  Assessment/Plan:  67 year old Caucasian male with morbid obesity, hypertension, type 2 diabetes mellitus, on chronic narcotic medications for back pain, CAD status post prior LAD and RCA PCI, admitted with anterior STEMI 09/03 s/p PCI  STEMI:  Culprit vessel mid LAD s/p aspiration thrombectomy and PCI Synergy DES 3.0 X 12 mm. Residual prox LAD severely calcified 80% stenosis.  Repeat cath 09/05 due to STE V1-V3. Patent mid LAD Stents with no stent thrombosis. STE possibly due to LV aneurysmal changes. Conitnue aspirin/Effient  HFrEF: Post MI. EF 35-40% EF 35-40%. Continue losartan to 25 mg. He is not in shock, as LVEDP is low at 8 mmHg. Suspect tachycardia is at least partially due to abdominal pathology. Await beta blocker introduction for now,   Sinus tachycardia: Not in shock as demonstrated by RHC on 09./03, and LVEDP of 8 mmHg on 09/05. Suspect tachycardia is at least partially due to abdominal pathology.  Given recent finding of possible abdominal mass, PE is probable. Will obtain CTA lung. Check thyroid function.   Abdominal distension: Abdominal mass with wide differential. Malignancy is possible. Appreciate GI recommendations. Needs biopsy for definitive diagnosis. His risk of stent thrombosis is high at this time with interruption of DAPT. Difficult situation. I have discussed the finding with the patient in detail. He has asked me not tell his son, until we have more inforamtion. He understands that the mass could be malignant. Regarding ascites, will introduce lasix and spironolacotne when BP could tolerate.  Type 2 DM:  Controlled    LOS: 3 days    Manish J Patwardhan 10/08/2017, 2:07 PM  [place], MD [facility] Pager: [phone] Office: [phone] If no answer Cell [phone]

## 2017-10-08 NOTE — Progress Notes (Signed)
Report given to [place].

## 2017-10-08 NOTE — Progress Notes (Signed)
Persistent sinus tachycardia with no clear etiology. Recent diagnosis of possible malignant abdominal mass. At least moderate probability of pulmonary embolism. Will obtain CTA.

## 2017-10-08 NOTE — Progress Notes (Signed)
Initial Nutrition Assessment  DOCUMENTATION CODES:   Non-severe (moderate) malnutrition in context of chronic illness  INTERVENTION:    Glucerna Shake po TID, each supplement provides 220 kcal and 10 grams of protein  Pro-stat 30 ml PO once daily, each supplement provides 100 kcal and 15 gm protein  NUTRITION DIAGNOSIS:   Moderate Malnutrition related to chronic illness(cirrhosis, CAD) as evidenced by mild fat depletion, moderate fat depletion, mild muscle depletion, moderate muscle depletion.  GOAL:   Patient will meet greater than or equal to 90% of their needs  MONITOR:   PO intake, Supplement acceptance, Weight trends, Skin  REASON FOR ASSESSMENT:   Malnutrition Screening Tool    ASSESSMENT:   67 yo male with PMH of HTN, HLD, DM-2, GERD, chronic back pain, CAD, and non-alcoholic cirrhosis who was admitted on 9/3 with STEMI s/p stent placement 9/5. 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   Patient reports poor intake for the past 2 weeks. He tries to eat as much as he can, but has had a decreased appetite. Usual intake is 2 meals per day: a "good breakfast" and a "light dinner". He wants to try Glucerna shake supplement. He has not lost any weight. He follows a low sodium diet at home because he does not like salt.  He had no trouble with breakfast today, consumed 100% of clear liquid tray.   Labs reviewed. Sodium 134 (L) CBG's: [phone] Medications reviewed and include Colace, Lasix.  Weight has been fairly stable, however, suspect volume overload is masking actual weight loss.  Patient currently has ascites.  NUTRITION - FOCUSED PHYSICAL EXAM:    Most Recent Value  Orbital Region  Moderate depletion  Upper Arm Region  Mild depletion  Thoracic and Lumbar Region  No depletion  Buccal Region  Mild depletion  Temple Region  Moderate depletion  Clavicle Bone Region  Mild depletion  Clavicle and Acromion Bone Region  No depletion  Scapular Bone Region  No  depletion  Dorsal Hand  Mild depletion  Patellar Region  Mild depletion  Anterior Thigh Region  No depletion  Posterior Calf Region  Mild depletion  Edema (RD Assessment)  Mild  Hair  Reviewed  Eyes  Reviewed  Mouth  Reviewed  Skin  Reviewed  Nails  Reviewed       Diet Order:   Diet Order            Diet 2 gram sodium Room service appropriate? Yes; Fluid consistency: Thin  Diet effective now              EDUCATION NEEDS:   No education needs have been identified at this time  Skin:  Skin Assessment: Reviewed RN Assessment  Last BM:  9/5  Height:   Ht Readings from Last 1 Encounters:  10/03/2017 6\' 4"  (1.93 m)    Weight:   Wt Readings from Last 1 Encounters:  10/08/17 115.3 kg    Ideal Body Weight:  91.8 kg  BMI:  Body mass index is 30.94 kg/m.  Estimated Nutritional Needs:   Kcal:  9528-4132  Protein:  120-140 gm  Fluid:  2.3 L    Molli Barrows, RD, LDN, [place] Pager [phone] After Hours Pager [phone]

## 2017-10-08 NOTE — Progress Notes (Signed)
1405 Holding ambulation at this time per cardiologist. Will continue to follow. Graylon Good RN BSN 10/08/2017 3:02 PM

## 2017-10-08 NOTE — Progress Notes (Signed)
  Echocardiogram 2D Echocardiogram has been performed.  Daniel Rivers 10/08/2017, 11:02 AM

## 2017-10-08 NOTE — Care Management (Signed)
10-08-17 BENEFITS CHECK:  #  2.   S/W  JUSTIN @  [place] # [phone]  1. EFFIENT  10 MG  DAILY  COVER-  NOT COVER PRIOR APPROVAL- YES # [phone]  FOR EXCEPTION  2. PRASUGREL 10 MG DAILY COVER- YES CO-PAY- $ 5.30 TIER- 3 DRUG PRIOR APPROVAL- NO  PREFERRED PHARMACY : YES [facility] M/O 90 DAY SUPPLY FOR RETAIL $ 20.38 90 DAY SUPPLY FOR  M/O  $ 10.10

## 2017-10-09 ENCOUNTER — Encounter (HOSPITAL_COMMUNITY): Payer: Self-pay | Admitting: Cardiology

## 2017-10-09 DIAGNOSIS — K7460 Unspecified cirrhosis of liver: Secondary | ICD-10-CM

## 2017-10-09 DIAGNOSIS — R188 Other ascites: Secondary | ICD-10-CM

## 2017-10-09 LAB — GLUCOSE, CAPILLARY
GLUCOSE-CAPILLARY: 211 mg/dL — AB (ref 70–99)
GLUCOSE-CAPILLARY: 228 mg/dL — AB (ref 70–99)
Glucose-Capillary: 288 mg/dL — ABNORMAL HIGH (ref 70–99)

## 2017-10-09 LAB — BASIC METABOLIC PANEL
ANION GAP: 9 (ref 5–15)
BUN: 15 mg/dL (ref 8–23)
CO2: 27 mmol/L (ref 22–32)
Calcium: 8.4 mg/dL — ABNORMAL LOW (ref 8.9–10.3)
Chloride: 96 mmol/L — ABNORMAL LOW (ref 98–111)
Creatinine, Ser: 1.08 mg/dL (ref 0.61–1.24)
GLUCOSE: 199 mg/dL — AB (ref 70–99)
POTASSIUM: 4.1 mmol/L (ref 3.5–5.1)
SODIUM: 132 mmol/L — AB (ref 135–145)

## 2017-10-09 LAB — CBC
HEMATOCRIT: 40.4 % (ref 39.0–52.0)
HEMOGLOBIN: 13.4 g/dL (ref 13.0–17.0)
MCH: 29.3 pg (ref 26.0–34.0)
MCHC: 33.2 g/dL (ref 30.0–36.0)
MCV: 88.2 fL (ref 78.0–100.0)
Platelets: 247 10*3/uL (ref 150–400)
RBC: 4.58 MIL/uL (ref 4.22–5.81)
RDW: 14 % (ref 11.5–15.5)
WBC: 10.8 10*3/uL — ABNORMAL HIGH (ref 4.0–10.5)

## 2017-10-09 LAB — LACTIC ACID, PLASMA
LACTIC ACID, VENOUS: 2.6 mmol/L — AB (ref 0.5–1.9)
LACTIC ACID, VENOUS: 2.5 mmol/L — AB (ref 0.5–1.9)

## 2017-10-09 MED ORDER — SPIRONOLACTONE 25 MG PO TABS
25.0000 mg | ORAL_TABLET | Freq: Every day | ORAL | Status: DC
  Administered 2017-10-09: 25 mg via ORAL
  Filled 2017-10-09: qty 1

## 2017-10-09 MED ORDER — FUROSEMIDE 10 MG/ML IJ SOLN
40.0000 mg | Freq: Once | INTRAMUSCULAR | Status: AC
  Administered 2017-10-09: 40 mg via INTRAVENOUS
  Filled 2017-10-09: qty 4

## 2017-10-09 MED ORDER — INSULIN DETEMIR 100 UNIT/ML ~~LOC~~ SOLN
10.0000 [IU] | Freq: Every day | SUBCUTANEOUS | Status: DC
  Administered 2017-10-09 – 2017-10-11 (×3): 10 [IU] via SUBCUTANEOUS
  Filled 2017-10-09 (×3): qty 0.1

## 2017-10-09 NOTE — Consult Note (Signed)
Medical Consultation   Daniel Rivers  JJH:[id number]  DOB: 03-Jul-1950  DOA: 10/30/2017  PCP: [name], MD   Outpatient Specialists: GI-Dr. Benson Norway, cardiology-Manish Esther Hardy, MD   Requesting physician: Nigel Mormon, MD  Reason for consultation: Assist with medical management and service change   History of Present Illness: Daniel Rivers is an 67 y.o. male with multiple complicated medical history including but not limited to CAD status post angioplasty, hyperlipidemia, that is mellitus, hepatic cirrhosis with baseline ascites.  He was admitted to the hospital : 10/21/2017 for ST elevation MI. and underwent successful PTCA and stent placement with thrombectomy. He was continued on aspirin with effient.  Postoperative course complicated by worsening ascites with shortness of breath and persistent tachycardia. He had CT of the chest and abdomen was note/d/ /no PE but bilateral pleural effusion, right more than left with moderate to large amount of free peritoneal fluid and cirrhotic changes. Potential mass lesion concerning for a malignancy was noted.he also had elevated lactic acid levels.  He was seen in consultation by PCCM 10/08/2017 who recommended diagnostic and therapeutic paracentesis which was done yesterday, as well as antibiotic coverage for possible sepsis. Since then shortness of breath has improved remarkably and patient uncomfortable overnight.  In addition patient is seen by GI consultant who recommended EUS with biopsy of mass lesion, noting high risk for stent thrombosis due to his recent PCI for ST elevation MI, and recommended this to be postponed to about 1-3 months from now.was continued on diuresis of Lasix and addition of Aldactone.  Hospital medicine is consulted to assist with medical management and care coordination, since he is deemed stable at this time from cardiac stand-point by cardiology/  Review of Systems:  Review of Systems   All other systems reviewed and are negative.  As per HPI otherwise 10 point review of systems negative.    Past Medical History: Past Medical History:  Diagnosis Date  . Ankylosing spondylitis of cervical region ([place])   . Ankylosing spondylitis of lumbar region ([place])   . Anxiety   . BPH (benign prostatic hypertrophy)   . Bursitis, trochanteric   . Chronic back pain   . Chronic cervical pain   . Coronary artery disease   . Daily headache    "recently; related to RX for my heart" (05/21/2014)  . Depression   . GERD (gastroesophageal reflux disease)   . Hemochromatosis   . High cholesterol   . History of hiatal hernia   . Hypertension   . Non-alcoholic cirrhosis ([place])    "from the hemochromatosis"  . Obesity   . Osteoarthritis    "joints" (05/20/2104)  . Rotator cuff syndrome   . Tendonitis   . Type II diabetes mellitus ([place])     Past Surgical History: Past Surgical History:  Procedure Laterality Date  . ANAL FISSURE REPAIR    . ANKLE FRACTURE SURGERY Left   . BACK SURGERY    . BONE TISSUE     "I've had removal of excess tissue as a result of my ankylosing spondylitis"  . CARDIAC CATHETERIZATION N/A 06/07/2014   Procedure: Coronary Stent Intervention;  Surgeon: Adrian Prows, MD;  Location: [place] [facility];  Service: Cardiovascular;  Laterality: N/A;  . CARPAL TUNNEL RELEASE Bilateral   . COLON SURGERY    . CORONARY ANGIOPLASTY WITH STENT PLACEMENT  2009; 05/22/2014   ?3; ?1  .  CORONARY STENT INTERVENTION N/A 10/20/2017   Procedure: CORONARY STENT INTERVENTION;  Surgeon: Nigel Mormon, MD;  Location: [facility];  Service: Cardiovascular;  Laterality: N/A;  . CORONARY THROMBECTOMY N/A 10/19/2017   Procedure: Coronary Thrombectomy;  Surgeon: Nigel Mormon, MD;  Location: [facility];  Service: Cardiovascular;  Laterality: N/A;  . cortisone injection    . FRACTURE SURGERY    . I&D EXTREMITY Left 06/29/2017   Procedure: IRRIGATION AND  DEBRIDEMENT EXTREMITY, LEFT FIFTH DIGIT AMPUTATION;  Surgeon: Iran Planas, MD;  Location: [facility];  Service: Orthopedics;  Laterality: Left;  . LEFT HEART CATH AND CORONARY ANGIOGRAPHY N/A 10/30/2017   Procedure: LEFT HEART CATH AND CORONARY ANGIOGRAPHY;  Surgeon: Nigel Mormon, MD;  Location: [facility];  Service: Cardiovascular;  Laterality: N/A;  . LEFT HEART CATH AND CORONARY ANGIOGRAPHY N/A 10/24/2017   Procedure: LEFT HEART CATH AND CORONARY ANGIOGRAPHY;  Surgeon: Nigel Mormon, MD;  Location: [facility];  Service: Cardiovascular;  Laterality: N/A;  . LEFT HEART CATHETERIZATION WITH CORONARY ANGIOGRAM N/A 05/22/2014   Procedure: LEFT HEART CATHETERIZATION WITH CORONARY ANGIOGRAM;  Surgeon: Adrian Prows, MD;  Location: [facility] C[facility];  Service: Cardiovascular;  Laterality: N/A;  . LUMBAR DISC SURGERY  X 2   "ruptured/herniated/growing together"  . PERCUTANEOUS CORONARY STENT INTERVENTION (PCI-S)  05/22/2014   Procedure: PERCUTANEOUS CORONARY STENT INTERVENTION (PCI-S);  Surgeon: Adrian Prows, MD;  Location: [facility];  Service: Cardiovascular;;  mid LAD DES  . PERCUTANEOUS CORONARY STENT INTERVENTION (PCI-S)  06/07/2014   des to rca  . RIGHT HEART CATH N/A 10/15/2017   Procedure: RIGHT HEART CATH;  Surgeon: Nigel Mormon, MD;  Location: [facility];  Service: Cardiovascular;  Laterality: N/A;  . SHOULDER ARTHROSCOPY W/ ROTATOR CUFF REPAIR Bilateral   . SMALL INTESTINE SURGERY  1960   "took out 14 inches"  . ULNAR TUNNEL RELEASE Left      Allergies:   Allergies  Allergen Reactions  . Amlodipine Besy-Benazepril Hcl Cough  . Brilinta [Ticagrelor] Cough  . Naproxen Other (See Comments)    Worsens Tinnitus     Social History:  reports that he quit smoking about 31 years ago. His smoking use included cigarettes. He has a 115.00 pack-year smoking history. He has never used smokeless tobacco. He reports that he drinks alcohol. He reports that he does not  use drugs.   Family History: No family history on file.    Physical Exam: Vitals:   10/08/17 2342 10/09/17 0237 10/09/17 0809 10/09/17 1125  BP: 95/63  102/82 100/80  Pulse:    (!) 127  Resp:   15 (!) 24  Temp: 98.7 F (37.1 C) 98.8 F (37.1 C) 97.7 F (36.5 C) 98.1 F (36.7 C)  TempSrc: Oral Oral Oral Oral  SpO2:   94% 95%  Weight:  115.3 kg    Height:        Constitutional:  Alert and awake, not in any acute distress. Eyes: PERLA, EOMI, irises appear normal, anicteric sclera,  ENMT: external ears and nose appear normal, n            Lips appears normal, oropharynx mucosa, tongue, posterior pharynx appear normal  Neck: neck appears normal, no masses, normal ROM, no thyromegaly, no JVD  CVS: S1-S2 clear, no murmur rubs or gallops, no LE edema, normal pedal pulses  Respiratory:  Of the bases otherwisecCTA bilaterally Abdomen: soft nontender, (+) distended, normal bowel sounds, no hepatosplenomegaly,  no hernias  Musculoskeletal: : no cyanosis, (+) trace bipedal pittinhg edema    Neuro: oriented x3 Psych: judgement and insight appear normal, stable mood and affect, mental status Skin: no rashes or lesions or ulcers, no induration or nodules    Data reviewed:  I have personally reviewed following labs and imaging studies Labs:  CBC: Recent Labs  Lab 10/11/2017 1146 10/18/2017 1241 10/06/17 0331 10/27/2017 1026 10/08/17 1436 10/09/17 0209  WBC 10.2  --  9.9 10.0 11.0* 10.8*  NEUTROABS 6.2  --  6.6  --   --   --   HGB 13.9 12.6* 13.7 13.2 14.2 13.4  HCT 42.9 37.0* 41.0 39.8 43.4 40.4  MCV 89.4  --  87.8 87.9 87.7 88.2  PLT 254  --  243 209 279 419    Basic Metabolic Panel: Recent Labs  Lab 10/06/2017 1146 10/27/2017 1241 10/06/17 0331 10/08/17 0330 10/09/17 0209  NA 135 122* 135 134* 132*  K 4.4 4.0 4.5 3.8 4.1  CL 101 88* 102 96* 96*  CO2 21*  --  22 26 27   GLUCOSE 215* 182* 176* 169* 199*  BUN 33* 31* 25* 12 15  CREATININE 1.50* 0.90 0.88 0.97 1.08  CALCIUM  9.0  --  8.9 8.4* 8.4*   GFR Estimated Creatinine Clearance: 92.2 mL/min (by C-G formula based on SCr of 1.08 mg/dL). Liver Function Tests: Recent Labs  Lab 10/07/2017 1146  AST 31  ALT 25  ALKPHOS 47  BILITOT 1.8*  PROT 6.0*  ALBUMIN 3.0*   No results for input(s): LIPASE, AMYLASE in the last 168 hours. No results for input(s): AMMONIA in the last 168 hours. Coagulation profile Recent Labs  Lab 10/15/2017 1146  INR 1.17    Cardiac Enzymes: Recent Labs  Lab 10/20/2017 1146 10/06/17 0331 10/08/17 1436  TROPONINI 0.03* 26.74* 4.50*   BNP: Invalid input(s): POCBNP CBG: Recent Labs  Lab 10/08/17 0655 10/08/17 1200 10/08/17 2133 10/09/17 0559 10/09/17 1127  GLUCAP 171* 208* 232* 211* 288*   D-Dimer No results for input(s): DDIMER in the last 72 hours. Hgb A1c No results for input(s): HGBA1C in the last 72 hours. Lipid Profile No results for input(s): CHOL, HDL, LDLCALC, TRIG, CHOLHDL, LDLDIRECT in the last 72 hours. Thyroid function studies Recent Labs    10/08/17 1436  TSH 0.812   Anemia work up No results for input(s): VITAMINB12, FOLATE, FERRITIN, TIBC, IRON, RETICCTPCT in the last 72 hours. Urinalysis No results found for: COLORURINE, APPEARANCEUR, [place], [place], [place], [place], [place], [place], PROTEINUR, UROBILINOGEN, NITRITE, LEUKOCYTESUR   Sepsis Labs Invalid input(s): PROCALCITONIN,  WBC,  LACTICIDVEN Microbiology Recent Results (from the past 240 hour(s))  MRSA PCR Screening     Status: Abnormal   Collection Time: 10/04/2017  3:45 PM  Result Value Ref Range Status   MRSA by PCR POSITIVE (A) NEGATIVE Final    Comment:        The GeneXpert MRSA Assay (FDA approved for NASAL specimens only), is one component of a comprehensive MRSA colonization surveillance program. It is not intended to diagnose MRSA infection nor to guide or monitor treatment for MRSA infections. RESULT CALLED TO, READ BACK BY AND VERIFIED WITH: Angelica Chessman RN 17:20  10/21/2017 ([email or web address]) Performed at [facility], [place] [address], [place], [place] [address]   Stat Gram stain     Status: None   Collection Time: 10/08/17  7:31 PM  Result Value Ref Range Status   Specimen Description PERITONEAL  Final   Special  Requests NONE  Final   Gram Stain   Final    FEW WBC PRESENT,BOTH PMN AND MONONUCLEAR NO ORGANISMS SEEN Performed at [facility], [place] [address] [place], [place] [address]    Report Status 10/08/2017 FINAL  Final  Culture, body fluid-bottle     Status: None (Preliminary result)   Collection Time: 10/08/17  7:31 PM  Result Value Ref Range Status   Specimen Description PERITONEAL  Final   Special Requests NONE  Final   Culture   Final    NO GROWTH < 24 HOURS Performed at [facility], [place] [address], [place], [place] [address]    Report Status PENDING  Incomplete       Inpatient Medications:   Scheduled Meds: . aspirin EC  81 mg Oral Daily  . atorvastatin  40 mg Oral q1800  . bisacodyl  10 mg Oral QHS  . buPROPion  300 mg Oral Daily  . Chlorhexidine Gluconate Cloth  6 each Topical Q0600  . docusate sodium  100 mg Oral Daily  . feeding supplement (GLUCERNA SHAKE)  237 mL Oral TID BM  . feeding supplement (PRO-STAT SUGAR FREE 64)  30 mL Oral Daily  . furosemide  20 mg Oral Daily  . heparin injection (subcutaneous)  5,000 Units Subcutaneous Q8H  . Influenza vac split quadrivalent PF  0.5 mL Intramuscular Tomorrow-1000  . insulin aspart  0-15 Units Subcutaneous TID WC  . insulin aspart  4 Units Subcutaneous TID WC  . mupirocin ointment  1 application Nasal BID  . naloxegol oxalate  12.5 mg Oral Daily  . polyethylene glycol  17 g Oral BID  . prasugrel  10 mg Oral Daily  . psyllium  1 packet Oral BID  . sodium chloride flush  3 mL Intravenous Q12H  . sodium chloride flush  3 mL Intravenous Q12H  . spironolactone  25 mg Oral Daily  . zolpidem  5 mg Oral QHS   Continuous Infusions: . sodium chloride 10 mL/hr at  10/08/17 2049  . sodium chloride    . sodium chloride    . cefTRIAXone (ROCEPHIN)  IV Stopped (10/08/17 2122)     Radiological Exams on Admission: Ct Angio Chest Pe W Or Wo Contrast  Result Date: 10/08/2017 CLINICAL DATA:  Worsening shortness of breath today. EXAM: CT ANGIOGRAPHY CHEST WITH CONTRAST TECHNIQUE: Multidetector CT imaging of the chest was performed using the standard protocol during bolus administration of intravenous contrast. Multiplanar CT image reconstructions and MIPs were obtained to evaluate the vascular anatomy. CONTRAST:  116mL ISOVUE-370 IOPAMIDOL (ISOVUE-370) INJECTION 76% COMPARISON:  Portable chest obtained earlier today. Abdomen ultrasound dated 10/30/2017. FINDINGS: Cardiovascular: Normally opacified pulmonary arteries with no pulmonary arterial filling defects seen. Atheromatous calcifications, including the coronary arteries and aorta. Mediastinum/Nodes: No enlarged mediastinal, hilar, or axillary lymph nodes. Thyroid gland, trachea, and esophagus demonstrate no significant findings. Lungs/Pleura: Mild bullous changes. Bilateral lower lobe atelectasis. Moderate-sized right pleural effusion and small left pleural effusion. Upper Abdomen: Moderate to large amount of free peritoneal fluid. The included portion of the liver has mildly lobulated contours with an enlarged lateral segment left lobe. Musculoskeletal: Thoracic and lower cervical spine degenerative changes with diffuse ankylosis. Review of the MIP images confirms the above findings. IMPRESSION: 1. No pulmonary emboli. 2. Moderate-sized right pleural effusion and small left pleural effusion. 3. Bilateral lower lobe atelectasis. 4. Moderate to large amount of free peritoneal fluid. 5. Changes of cirrhosis of the liver. 6. Mild changes of COPD.  7. Atheromatous calcifications, including the coronary arteries and aorta. Aortic Atherosclerosis (ICD10-I70.0) and Emphysema (ICD10-J43.9). Electronically Signed   By: Claudie Revering  M.D.   On: 10/08/2017 18:40    Impression/Recommendations Principal Problem:   Acute MI, anterolateral wall, initial episode of care [facility]) Active Problems:   STEMI (ST elevation myocardial infarction) ([place])   Malnutrition of moderate degree   Other ascites   Abdominal distension   Abdominal distention   Shortness of breath   1.ascites/abd mass: No evidence of abdominal compartment syndrome clinically Therapeutic paracentesis as needed Follow-up on positive fluid analysis/cytology EUS and biopsy of mass lesion 1-3 months time - High risk for stent thrombosis at this time few days post PCI for STEMI  2.Lactic Acidosis with tachycardia: Aggressive antibiotic coverage for now- doubt sepsis though ? intravascularly volume depleted Pan Cx Monitor WBCC, renal fxn with electrolytes, I/O Consider gentle IVF as needed  3. DM2: Cont SSI Add long acting insulin and adjust as needed  4.STEMI: Post MI. EF 35-40% Culprit - LAD s/p aspiration thrombectomy and PCI Synergy DES 3.0 X 12 mm. Residual prox LAD severely calcified 80% stenosis that would ideally need staged atherectomy- on hold for now pending more infromation on diagnosis and prognosis of abdominal mass.  Repeat cath 09/05 due to STE V1-V3. Patent mid LAD Stents with no stent thrombosis. STE possibly due to LV aneurysmal changes. Continue ARB Await B-Blocker for now per cardiology Conitnue aspirin/Effient      Thank you for this consultation.  Our [facility] hospitalist team will follow the patient with you.   Time Spent:45 mins    Benito Mccreedy M.D. [facility] 10/09/2017, 1:25 PM

## 2017-10-09 NOTE — Progress Notes (Addendum)
Subjective:  Continues to have abdominal distension Underwent 1.5 L paracentesis on 10/09/17. Intermittent episodes of shortness of breath. although no hypoxia.   New diagnosis of abdominal mass on CT abdomnen  Objective:  Vital Signs in the last 24 hours: Temp:  [97.7 F (36.5 C)-99.5 F (37.5 C)] 97.7 F (36.5 C) (09/07 0809) Pulse Rate:  [117-142] 118 (09/06 2026) Resp:  [14-25] 15 (09/07 0809) BP: (95-115)/(63-82) 102/82 (09/07 0809) SpO2:  [94 %-97 %] 94 % (09/07 0809) Weight:  [115.3 kg] 115.3 kg (09/07 0237)  Intake/Output from previous day: 09/06 0701 - 09/07 0700 In: 37 [P.O.:720; IV Piggyback:100] Out: 900 [Urine:900] Intake/Output from this shift: Total I/O In: 240 [P.O.:240] Out: 100 [Urine:100]  Physical Exam: Constitutional: He is oriented to person, place, and time. He appears well-developed and well-nourished. He appears in no distress  HENT:  Head: Atraumatic.  Eyes: Conjunctivae are normal.  Neck: Neck supple. No JVD present.  Cardiovascular: Normal rate, regular rhythm and normal heart sounds. Tachycardic No murmur heard. Pulses: Intact distal pulses, except absent PT b/l Respiratory: Remains mildly tachypnic Abdominal: Soft. Bowel sounds are normal.  Abdominal distension. No tenderness. Musculoskeletal: He exhibits no edema or deformity.  Lymphadenopathy:    He has no cervical adenopathy.  Neurological: He is alert and oriented to person, place, and time.  Skin: Skin is warm and dry.  Psychiatric: He has a normal mood and affect.   Lab Results: Recent Labs    10/08/17 1436 10/09/17 0209  WBC 11.0* 10.8*  HGB 14.2 13.4  PLT 279 247   Recent Labs    10/08/17 0330 10/09/17 0209  NA 134* 132*  K 3.8 4.1  CL 96* 96*  CO2 26 27  GLUCOSE 169* 199*  BUN 12 15  CREATININE 0.97 1.08   Recent Labs    10/08/17 1436  TROPONINI 4.50*   CT Abdomen w/contrast 10/20/2017: 1. Potential mass lesion in the central mesentery positioned  between the SMV and second portion duodenum. Mass suggests lymphoma, gastrointestinal stromal tumor, primary duodenal carcinoma, carcinoid tumor, atypical pancreatic carcinoma or metastatic nodal conglomerate from unknown primary. Lower abdomen and pelvis not included on exam. Consider endoscopic ultrasound (EUS) evaluation and tumor sampling. FDG PET scan may be beneficial on outpatient basis. 2. Nodule liver with ascites suggests cirrhosis. 3. Normal bilateral pleural effusions and associated atelectasis.  Cardiac Studies: EKG 10/06/2017: Sinus tachycardia 100 bpm. Age indeterminate anrteroseptal and inferior infarct. ST elevation improved compared to previous EKG.   Echocardiogram 10/07/2017: Study Conclusions  - Left ventricle: The cavity size was normal. There was mild   concentric hypertrophy. Systolic function was moderately reduced.   The estimated ejection fraction was in the range of 35% to 40%.   Severe hypokinesis of the anteroseptal and apical myocardium.   Features are consistent with a pseudonormal left ventricular   filling pattern, with concomitant abnormal relaxation and   increased filling pressure (grade 2 diastolic dysfunction). - Aortic valve: Mildly calcified annulus. - No hemodynamically significant valvular abnormality.   No evidence of pulmonary hypertension.   LM: Normal LAD: Prox LAD severely calcified 80% stenosis (Non-culprit)         Mid LAD 100% ISR ([place] stenosis) LCx: Mild prox disease RCA: Patent prox-mid stent. Focal 50% restenosis  Successful PTCA and stent placement, plus aspiration thrombectomy  RA: 12 mmHg PA: 34/22 mmHg Mean PA 27 mmHg PW: 17 mmHg  LV 84/9 mmHg, LVEDP 16 mmHg  CO: 6.1 L/min CI 2.5 L/min/m2  No cardiogenic  shock  Cath 10/14/2017: LM: Normal LAD: Prox LAD calcified 80% stenosis. Stable, unchanged compared to cath on 09/03          Patent mid LAD stents, no significant restenosis LCx: Mild prox  disease RCA: Mild prox ISR  No changes compared to post PCI angiographic images on 09/03 LVEDP 8 mmHg EKG changes possible related to post MI LV aneurysmal changes.  Recommendation: Continue aspirin 81 mg, Effient 10 mg daily. Continue workup for patient's abdominal distension.  Assessment/Plan:  67 year old Caucasian male with morbid obesity, hypertension, type 2 diabetes mellitus, on chronic narcotic medications for back pain, CAD status post prior LAD and RCA PCI, admitted with anterior STEMI 09/03 s/p PCI, hemochromatosis with liver cirrhosis followed by Dr. Benson Norway, now with ascites, new diagnosis of abdominal mass-possibly malignant, pesistent sinus tachycardia.  STEMI:  Culprit vessel mid LAD s/p aspiration thrombectomy and PCI Synergy DES 3.0 X 12 mm. Residual prox LAD severely calcified 80% stenosis that would ideally need staged atherectomy. However, the plan would have to be on hold pending more infromation on diagnosis and prognosis of abdominal mass.  Repeat cath 09/05 due to STE V1-V3. Patent mid LAD Stents with no stent thrombosis. STE possibly due to LV aneurysmal changes. Conitnue aspirin/Effient  HFrEF: Post MI. EF 35-40% EF 35-40%. Continue losartan to 25 mg. He is not in cardiogenic shock, as LVEDP is low at 8 mmHg. Suspect tachycardia is at least partially due to abdominal pathology. Await beta blocker introduction for now.   Sinus tachycardia: Not due to cardiogenic shock. No PE on CTA. Possible due to sepsis or underlying abdominal pathology/possible abdominal compartment syndrome? On anyiibiotics as per critical care recommendations. Appreciate their help.   Abdominal distension: Ascites, ?abdominal compartment syndrome, new diagnosis of mass- possible malignant. GI recommending EUS and biopsy, when feasible. He is at high risk of stent thrombosis few days post primary PCI for STEMI making interruption of antiplatelet therapy difficult. Will need to weigh  risks/benegits and timing for the biopsy. Possible 1-3 months from now. Hopefully, paracentesis performed on 09/06 may offer clue from cytology. He may need repeat therapeutic paracentesis. Continue PO lasix 20 mg. Add spironolactone 25 mg PO today. May skip losartan in lieu of requiring spironolactone for ascites.   Lactic acidosis: Possible sepsis.  Type 2 DM:  Controlled  Given that his cardiac codition is currently stable, his ongoing management would revolve around ascites and possible sepsis. I will request the hospitalist service to take over as primary. I will continue to follow along.    LOS: 4 days    Ezariah Nace J Stephenia Vogan 10/09/2017, 11:24 AM  [place], MD [facility] [place] Pager: [phone] Office: [phone] If no answer Cell [phone]

## 2017-10-09 NOTE — Progress Notes (Signed)
CRITICAL VALUE ALERT  Critical Value:  Lactic acid 2.5  Date & Time Notied:  10/09/2017  Provider Notified: Osei-Bonsu   Orders Received/Actions taken:

## 2017-10-09 NOTE — Progress Notes (Signed)
CRITICAL VALUE ALERT  Critical Value:  Lactic acid 2.6  Date & Time Notied:  10/09/2017 @1513   Provider Notified: Langston Reusing MD  Orders Received/Actions taken:

## 2017-10-09 NOTE — Progress Notes (Addendum)
Progress Note  Weekend coverage for Dr. 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  Also tells me he was started on Lasix 20 mg daily.  Reports that he was feeling very well until he decided to try and eat something this morning which did make him feel somewhat bloated and full.  Overall his abdomen is less distended than it was previously.  He is happy with his progress.    Objective   Vital signs in last 24 hours: Temp:  [97.7 F (36.5 C)-99.5 F (37.5 C)] 97.7 F (36.5 C) (09/07 0809) Pulse Rate:  [117-142] 118 (09/06 2026) Resp:  [14-25] 15 (09/07 0809) BP: (95-115)/(63-87) 102/82 (09/07 0809) SpO2:  [94 %-97 %] 94 % (09/07 0809) Weight:  [115.3 kg] 115.3 kg (09/07 0237) Last BM Date: 10/24/2017 General:    white male in NAD Heart:  Regular rate and rhythm; no murmurs Lungs: Respirations even and unlabored, lungs CTA bilaterally Abdomen:  Soft, Mild generalized ttp, mild distension, Normal bowel sounds. Extremities:  Without edema. Neurologic:  Alert and oriented,  grossly normal neurologically. Psych:  Cooperative. Normal mood and affect.  Intake/Output from previous day: 09/06 0701 - 09/07 0700 In: 2 [P.O.:720; IV Piggyback:100] Out: 900 [Urine:900] Intake/Output this shift: Total I/O In: 240 [P.O.:240] Out: 100 [Urine:100]  Lab Results: Recent Labs    10/06/2017 1026 10/08/17 1436 10/09/17 0209  WBC 10.0 11.0* 10.8*  HGB 13.2 14.2 13.4  HCT 39.8 43.4 40.4  PLT 209 279 247   BMET Recent Labs    10/08/17 0330 10/09/17 0209  NA 134* 132*  K 3.8 4.1  CL 96* 96*  CO2 26 27  GLUCOSE 169* 199*  BUN 12 15  CREATININE 0.97 1.08  CALCIUM 8.4* 8.4*   Studies/Results: Ct Angio Chest Pe W Or Wo Contrast  Result Date: 10/08/2017 CLINICAL DATA:  Worsening shortness of breath today. EXAM: CT  ANGIOGRAPHY CHEST WITH CONTRAST TECHNIQUE: Multidetector CT imaging of the chest was performed using the standard protocol during bolus administration of intravenous contrast. Multiplanar CT image reconstructions and MIPs were obtained to evaluate the vascular anatomy. CONTRAST:  182mL ISOVUE-370 IOPAMIDOL (ISOVUE-370) INJECTION 76% COMPARISON:  Portable chest obtained earlier today. Abdomen ultrasound dated 10/13/2017. FINDINGS: Cardiovascular: Normally opacified pulmonary arteries with no pulmonary arterial filling defects seen. Atheromatous calcifications, including the coronary arteries and aorta. Mediastinum/Nodes: No enlarged mediastinal, hilar, or axillary lymph nodes. Thyroid gland, trachea, and esophagus demonstrate no significant findings. Lungs/Pleura: Mild bullous changes. Bilateral lower lobe atelectasis. Moderate-sized right pleural effusion and small left pleural effusion. Upper Abdomen: Moderate to large amount of free peritoneal fluid. The included portion of the liver has mildly lobulated contours with an enlarged lateral segment left lobe. Musculoskeletal: Thoracic and lower cervical spine degenerative changes with diffuse ankylosis. Review of the MIP images confirms the above findings. IMPRESSION: 1. No pulmonary emboli. 2. Moderate-sized right pleural effusion and small left pleural effusion. 3. Bilateral lower lobe atelectasis. 4. Moderate to large amount of free peritoneal fluid. 5. Changes of cirrhosis of the liver. 6. Mild changes of COPD. 7. Atheromatous calcifications, including the coronary arteries and aorta. Aortic Atherosclerosis (ICD10-I70.0) and Emphysema (ICD10-J43.9). Electronically Signed   By: Claudie Revering M.D.   On: 10/08/2017 18:40   Ct Abdomen W Contrast  Result Date: 10/22/2017 CLINICAL DATA:  Abdominal distension. Short  of breath. Recent heart catheterization. History of small bowel resection 1960 EXAM: CT ABDOMEN WITH CONTRAST TECHNIQUE: Multidetector CT imaging of the  abdomen was performed using the standard protocol following bolus administration of intravenous contrast. CONTRAST:  112mL ISOVUE-300 IOPAMIDOL (ISOVUE-300) INJECTION 61%, <See Chart> ISOVUE-300 IOPAMIDOL (ISOVUE-300) INJECTION 61% COMPARISON:  None. FINDINGS: Lower chest: Bilateral pleural effusions which are small to moderate and greater on the RIGHT. There is associated bibasilar atelectasis Hepatobiliary: Liver has a fine nodular contour. Caudate lobe is prominent. Portal veins patent. Gallbladder normal. Pancreas: Normal pancreatic parenchymal intensity. No ductal dilatation or inflammation. Spleen: Normal spleen. Adrenals/urinary tract: Small LEFT adrenal adenoma. No renal obstruction. Low-density lesions in the renal cortex likely represent benign cysts Stomach/Bowel: NG tube extends into the gastric antrum. Stomach, duodenum and limited view of the bowel and colon demonstrate no acute findings. Is moderate volume fluid in the peritoneal space along the pericolic gutters in surrounding the liver and spleen. Vascular/Lymphatic: Abdominal aortic normal caliber with intimal calcifications. There is central mesenteric mass positioned between the superior mesenteric vein and the third portion duodenum measuring 5.1 x 3.2 cm (image 53/3). Lesion also seen on coronal image 86/6. Lesion abuts the uncinate of the pancreas. Musculoskeletal: No aggressive osseous lesion IMPRESSION: 1. Potential mass lesion in the central mesentery positioned between the SMV and second portion duodenum. Mass suggests lymphoma, gastrointestinal stromal tumor, primary duodenal carcinoma, carcinoid tumor, atypical pancreatic carcinoma or metastatic nodal conglomerate from unknown primary. Lower abdomen and pelvis not included on exam. Consider endoscopic ultrasound (EUS) evaluation and tumor sampling. FDG PET scan may be beneficial on outpatient basis. 2. Nodule liver with ascites suggests cirrhosis. 3. Normal bilateral pleural effusions  and associated atelectasis. Electronically Signed   By: Suzy Bouchard M.D.   On: 10/10/2017 13:02       Assessment / Plan:   Assessment: 1.  Status post STEMI 2.  Cirrhosis with new ascites: Removal of 1.5 L of fluid yesterday, did provide some relief for patient 3.  Mesenteric mass  Plan: 1.  Again the plan per Dr. Benson Norway would be eventually for Lasix 40 mg daily and Spironolactone 100 mg daily when cleared by cardiology, currently tolerating Lasix 20 mg daily 2.  Continue 2 g sodium diet 3.  Patient will need EUS with FNA of the mesenteric mass in the next 1 to 3 months, again per further discussions with cardiology to find a safe window for holding anticoagulation after recent STEMI with stent placement 4.  Please await further recommendations from Dr. Loletha Carrow later today   LOS: 4 days   Levin Erp  10/09/2017, 10:12 AM  I have discussed the case with the PA, and that is the plan I formulated. I personally interviewed and examined the patient.  Additional review of systems: No chest pain or dysuria.  Feels short of breath in that he has difficulty taking a deep breath due to abdominal distention.  Tenuous volume status, low-dose diuretic started.  Optimal diuretic doses outlined by Dr. Benson Norway, hopefully blood pressure and other factors will allow this to occur.  He may need repeat paracentesis for relief until his diuretics get up to optimal dose.  2 g sodium restriction is essential.  I will see the patient again over the weekend only if called him otherwise Dr. Benson Norway will return on Monday.  Total 25 minutes, over half spent face-to-face with patient.  He had questions regarding fluid management, diuretics, and management of his mesenteric mass.    Estill Cotta  [place] [facility] Office: [phone]

## 2017-10-10 ENCOUNTER — Other Ambulatory Visit: Payer: Self-pay

## 2017-10-10 LAB — CBC
HEMATOCRIT: 41.6 % (ref 39.0–52.0)
HEMOGLOBIN: 13.5 g/dL (ref 13.0–17.0)
MCH: 28.7 pg (ref 26.0–34.0)
MCHC: 32.5 g/dL (ref 30.0–36.0)
MCV: 88.3 fL (ref 78.0–100.0)
PLATELETS: 281 10*3/uL (ref 150–400)
RBC: 4.71 MIL/uL (ref 4.22–5.81)
RDW: 14.1 % (ref 11.5–15.5)
WBC: 12.4 10*3/uL — AB (ref 4.0–10.5)

## 2017-10-10 LAB — COMPREHENSIVE METABOLIC PANEL
ALBUMIN: 2.7 g/dL — AB (ref 3.5–5.0)
ALK PHOS: 48 U/L (ref 38–126)
ALT: 44 U/L (ref 0–44)
ANION GAP: 10 (ref 5–15)
AST: 50 U/L — ABNORMAL HIGH (ref 15–41)
BUN: 25 mg/dL — ABNORMAL HIGH (ref 8–23)
CHLORIDE: 95 mmol/L — AB (ref 98–111)
CO2: 25 mmol/L (ref 22–32)
Calcium: 8.2 mg/dL — ABNORMAL LOW (ref 8.9–10.3)
Creatinine, Ser: 1.14 mg/dL (ref 0.61–1.24)
GFR calc Af Amer: >60 mL/min (ref >60)
GFR calc non Af Amer: >60 mL/min (ref >60)
GLUCOSE: 198 mg/dL — AB (ref 70–99)
Potassium: 4.2 mmol/L (ref 3.5–5.1)
SODIUM: 130 mmol/L — AB (ref 135–145)
Total Bilirubin: 1 mg/dL (ref 0.3–1.2)
Total Protein: 5.7 g/dL — ABNORMAL LOW (ref 6.5–8.1)

## 2017-10-10 LAB — GLUCOSE, CAPILLARY
GLUCOSE-CAPILLARY: 180 mg/dL — AB (ref 70–99)
GLUCOSE-CAPILLARY: 258 mg/dL — AB (ref 70–99)
GLUCOSE-CAPILLARY: 169 mg/dL — AB (ref 70–99)
Glucose-Capillary: 196 mg/dL — ABNORMAL HIGH (ref 70–99)
Glucose-Capillary: 227 mg/dL — ABNORMAL HIGH (ref 70–99)

## 2017-10-10 LAB — URINE CULTURE: CULTURE: NO GROWTH

## 2017-10-10 LAB — TSH: TSH: 1.201 u[IU]/mL (ref 0.350–4.500)

## 2017-10-10 LAB — LACTIC ACID, PLASMA: Lactic Acid, Venous: 1.8 mmol/L (ref 0.5–1.9)

## 2017-10-10 MED ORDER — LACTATED RINGERS IV SOLN
INTRAVENOUS | Status: DC
  Administered 2017-10-10: 14:00:00 via INTRAVENOUS

## 2017-10-10 MED ORDER — ALBUMIN HUMAN 25 % IV SOLN
50.0000 g | Freq: Once | INTRAVENOUS | Status: AC | PRN
  Administered 2017-10-11: 50 g via INTRAVENOUS
  Filled 2017-10-10: qty 200

## 2017-10-10 MED ORDER — FUROSEMIDE 20 MG PO TABS: 20.0000 mg | ORAL_TABLET | Freq: Every day | ORAL | Status: DC

## 2017-10-10 MED ORDER — METOPROLOL TARTRATE 12.5 MG HALF TABLET
12.5000 mg | ORAL_TABLET | Freq: Two times a day (BID) | ORAL | Status: DC
  Administered 2017-10-10 – 2017-10-16 (×15): 12.5 mg via ORAL
  Filled 2017-10-10 (×16): qty 1

## 2017-10-10 MED ORDER — SPIRONOLACTONE 25 MG PO TABS
25.0000 mg | ORAL_TABLET | Freq: Every day | ORAL | Status: DC
  Administered 2017-10-10 – 2017-10-11 (×2): 25 mg via ORAL
  Filled 2017-10-10 (×2): qty 1

## 2017-10-10 NOTE — Progress Notes (Signed)
@IPLOG @        PROGRESS NOTE                                                                                                                                                                                                             Patient Demographics:    Daniel Rivers, is a 67 y.o. male, DOB - 1950/08/06, UYQ:[id number]  Admit date - 10/20/2017   Admitting Physician Nigel Mormon, MD  Outpatient Primary MD for the patient is [name], MD  LOS - 5  Chief Complaint  Patient presents with  . Chest Pain       Brief Narrative   this is a 67 year old Caucasian gentleman with history of ankylosing spondylitis of the C-spine and lumbar area, anxiety, BPH, chronic back pain, CAD, GERD, dyslipidemia, obesity, nonalcoholic cirrhosis, hemochromatosis, type 2 diabetes who is under the care of Dr. Carol Ada, he was admitted by cardiology on 10/03/2017 for STEMI requiring a stent placement, subsequently he developed abdominal pain and distention and was found to have ascites, GI was consulted and patient was finally transferred to our service on 10/09/2017 for management of his medical issues.  The patient has recurrent ascites, he is hypotensive and tachycardic.  He has also been seen by pulmonary critical care this admission on consultation.   Subjective:    Daniel Rivers today has, No headache, No chest pain, No abdominal pain but abdominal distention is present- No Nausea, No new weakness tingling or numbness, No Cough - SOB.    Assessment  & Plan :     1.  Initial hospital admission for STEMI caused by LAD lesion requiring DES placement this admission.  Cardiology was primary now they are following as a consulting service, he is currently chest pain-free and this problem has resolved, currently on dual antiplatelet therapy, low-dose beta-blocker and statin for secondary prevention.  Defer management of this problem to the cardiology service.  This discussed with the cardiologist Dr.  Virgina Jock on 10/10/2017.  2.  History of hemochromatosis, hemochromatosis induced cirrhosis.  Currently has moderate to large ascites, he had a therapeutic and diagnostic paracentesis few days ago with 1 L of fluid removed, neutrophil count around 200, technically this is less than SBP however he is currently on Rocephin and for now will continue until cultures are finalized.  I have ordered another ultrasound-guided paracentesis mainly therapeutic on 10/10/2017 due to massive ascites, have ordered cytology and basic Gram stain and cell count on this as well.  Low-dose Aldactone only for now.  GI on board.  3.  Shortness of breath.  Caused by #2 above.  Supportive care with oxygen and fluid removal through paracentesis to reduce abdominal distention.  4.  Hypotension, tachycardia, dry oral mucosa - severely intravascularly dehydrated, he is hyponatremic and hypochloremic, will hold Lasix, LR for hydration, monitor closely.  5.  Hyponatremia and hypochloremia.  Due to mentation of third spaced fluid in his belly and intravascular depletion, unable to review IVC status on echocardiogram due to limited views, for now hold Lasix, paracentesis for extra fluid removal and gentle LR.  Repeat BMP tomorrow.  6.  Moderate to severe protein calorie malnutrition.  On Protostat.  7.  Mildly elevated lactate.  This is due to intravascular dehydration.  Hydrate and monitor.  He does not appear septic.  8.  Dyslipidemia.  On statin.  9.  Left upper pole kidney cyst.  Outpatient PCP follow-up.    10.  Potential abdominal mass.  Defer that to GI.  Dr. Benson Norway on board.  11. DM. type II -gently on Levemir along with sliding scale.  Monitor and adjust.  Lab Results  Component Value Date   HGBA1C 6.9 (H) 10/06/2017   CBG (last 3)  Recent Labs    10/09/17 2106 10/10/17 0608 10/10/17 1232  GLUCAP 180* 196* 258*     Family Communication  : None  Code Status :  Full  Disposition Plan  :   [place]  Consults  :  GI, PCCM, Cards  Procedures  :    10/03/2017 - Left heart catheterization with LAD DES placement   10/12/2017 - TTE -  - Left ventricle: The cavity size was normal. There was mild concentric hypertrophy. Systolic function was moderately reduced.   The estimated ejection fraction was in the range of 35% to 40%. Severe hypokinesis of the anteroseptal and apical myocardium. Features are consistent with a pseudonormal left ventricular filling pattern, with concomitant abnormal relaxation and increased filling pressure (grade 2 diastolic dysfunction).  Aortic valve: Mildly calcified annulus. No hemodynamically significant valvular abnormality. No evidence of pulmonary hypertension.  10/03/2017 [place] -  1. Cirrhosis of the liver.  Small amount of abdominal ascites. 2. Negative for gallstones or biliary dilatation 3. 1.4 cm hypoechoic lesion upper pole left kidney, complex cystic versus small solid nodule. Could further evaluate with dedicatedrenal CT or MRI when clinically feasible.  10/12/2017 - CT - 1. No pulmonary emboli. 2. Moderate-sized right pleural effusion and small left pleural effusion. 3. Bilateral lower lobe atelectasis. 4. Moderate to large amount of free peritoneal fluid. 5. Changes of cirrhosis of the liver. 6. Mild changes of COPD. 7. Atheromatous calcifications, including the coronary arteries and aorta. Aortic Atherosclerosis (ICD10-I70.0) and Emphysema (ICD10-J43.9).  10/08/17 TTE - - Left ventricle: The cavity size was normal. Wall thickness was increased in a pattern of mild LVH. The estimated ejection fraction was 45%. Akinesis of the apical septal wall, the apical anterior wall, and the true apex. Mitral valve: Mildly calcified annulus.  10/08/17 -  Ultrasound-guided paracentesis by IR yielding 1 L of fluid.  Neutrophil count close to 200, SAAG score cannot be calculated as there was no serum albumin albumin from that day.  10/08/17 CT - 1. Potential mass lesion in the central  mesentery positioned between the SMV and second portion duodenum. Mass suggests lymphoma, gastrointestinal stromal tumor, primary duodenal carcinoma, carcinoid tumor, atypical pancreatic carcinoma or metastatic nodal conglomerate from unknown primary. Lower abdomen and pelvis not included on exam. Consider endoscopic ultrasound (EUS) evaluation  and tumor sampling. FDG PET scan may be beneficial on outpatient basis. 2. Nodule liver with ascites suggests cirrhosis. 3. Normal bilateral pleural effusions and associated atelectasis.  10/10/2017.  Repeat paracentesis ordered.    DVT Prophylaxis  :   Heparin    Lab Results  Component Value Date   PLT 281 10/10/2017    Diet :  Diet Order            Diet 2 gram sodium Room service appropriate? Yes; Fluid consistency: Thin  Diet effective now               Inpatient Medications Scheduled Meds: . aspirin EC  81 mg Oral Daily  . atorvastatin  40 mg Oral q1800  . bisacodyl  10 mg Oral QHS  . buPROPion  300 mg Oral Daily  . Chlorhexidine Gluconate Cloth  6 each Topical Q0600  . docusate sodium  100 mg Oral Daily  . feeding supplement (GLUCERNA SHAKE)  237 mL Oral TID BM  . feeding supplement (PRO-STAT SUGAR FREE 64)  30 mL Oral Daily  . heparin injection (subcutaneous)  5,000 Units Subcutaneous Q8H  . Influenza vac split quadrivalent PF  0.5 mL Intramuscular Tomorrow-1000  . insulin aspart  0-15 Units Subcutaneous TID WC  . insulin aspart  4 Units Subcutaneous TID WC  . insulin detemir  10 Units Subcutaneous QHS  . metoprolol tartrate  12.5 mg Oral BID  . naloxegol oxalate  12.5 mg Oral Daily  . polyethylene glycol  17 g Oral BID  . prasugrel  10 mg Oral Daily  . psyllium  1 packet Oral BID  . sodium chloride flush  3 mL Intravenous Q12H  . spironolactone  25 mg Oral Daily  . zolpidem  5 mg Oral QHS   Continuous Infusions: . albumin human    . cefTRIAXone (ROCEPHIN)  IV 2 g (10/09/17 2131)  . lactated ringers     PRN  Meds:.acetaminophen, albumin human, diazepam, morphine, nitroGLYCERIN, ondansetron (ZOFRAN) IV, simethicone  Antibiotics  :   Anti-infectives (From admission, onward)   Start     Dose/Rate Route Frequency Ordered Stop   10/08/17 2000  cefTRIAXone (ROCEPHIN) 2 g in sodium chloride 0.9 % 100 mL IVPB     2 g 200 mL/hr over 30 Minutes Intravenous Every 24 hours 10/08/17 1917            Objective:   Vitals:   10/09/17 1930 10/09/17 2350 10/10/17 0007 10/10/17 0354  BP: (!) 114/92 103/67 103/67 108/76  Pulse: (!) 126 (!) 121  (!) 114  Resp: (!) 24 20 (!) 25 (!) 21  Temp: 98.6 F (37 C) 98 F (36.7 C)  97.8 F (36.6 C)  TempSrc: Oral Oral  Oral  SpO2: 96% 94%  95%  Weight:    117.8 kg  Height:        Wt Readings from Last 3 Encounters:  10/10/17 117.8 kg  09/30/17 116.4 kg  08/02/17 114.8 kg     Intake/Output Summary (Last 24 hours) at 10/10/2017 1348 Last data filed at 10/09/2017 1941 Gross per 24 hour  Intake 120 ml  Output 400 ml  Net -280 ml     Physical Exam  Awake Alert, Oriented X 3, No new F.N deficits, Normal affect [place].AT,PERRAL Supple Neck,No JVD, No cervical lymphadenopathy appriciated.  Symmetrical Chest wall movement, Good air movement bilaterally, CTAB RRR,No Gallops,Rubs or new Murmurs, No Parasternal Heave +ve B.Sounds, Abd ++ distended, No tenderness, No organomegaly appriciated, No  rebound - guarding or rigidity. No Cyanosis, Clubbing or edema, No new Rash or bruise       Data Review:    CBC Recent Labs  Lab 10/23/2017 1146  10/06/17 0331 10/21/2017 1026 10/08/17 1436 10/09/17 0209 10/10/17 0413  WBC 10.2  --  9.9 10.0 11.0* 10.8* 12.4*  HGB 13.9   < > 13.7 13.2 14.2 13.4 13.5  HCT 42.9   < > 41.0 39.8 43.4 40.4 41.6  PLT 254  --  243 209 279 247 281  MCV 89.4  --  87.8 87.9 87.7 88.2 88.3  MCH 29.0  --  29.3 29.1 28.7 29.3 28.7  MCHC 32.4  --  33.4 33.2 32.7 33.2 32.5  RDW 13.6  --  13.7 13.8 13.8 14.0 14.1  LYMPHSABS 2.6  --  2.1  --    --   --   --   MONOABS 1.2*  --  1.1*  --   --   --   --   EOSABS 0.2  --  0.1  --   --   --   --   BASOSABS 0.1  --  0.1  --   --   --   --    < > = values in this interval not displayed.    Chemistries  Recent Labs  Lab 10/24/2017 1146 10/17/2017 1241 10/06/17 0331 10/08/17 0330 10/09/17 0209 10/10/17 0413  NA 135 122* 135 134* 132* 130*  K 4.4 4.0 4.5 3.8 4.1 4.2  CL 101 88* 102 96* 96* 95*  CO2 21*  --  22 26 27 25   GLUCOSE 215* 182* 176* 169* 199* 198*  BUN 33* 31* 25* 12 15 25*  CREATININE 1.50* 0.90 0.88 0.97 1.08 1.14  CALCIUM 9.0  --  8.9 8.4* 8.4* 8.2*  AST 31  --   --   --   --  50*  ALT 25  --   --   --   --  44  ALKPHOS 47  --   --   --   --  48  BILITOT 1.8*  --   --   --   --  1.0   ------------------------------------------------------------------------------------------------------------------ No results for input(s): CHOL, HDL, LDLCALC, TRIG, CHOLHDL, LDLDIRECT in the last 72 hours.  Lab Results  Component Value Date   HGBA1C 6.9 (H) 10/06/2017   ------------------------------------------------------------------------------------------------------------------ Recent Labs    10/08/17 1436  TSH 0.812   ------------------------------------------------------------------------------------------------------------------ No results for input(s): VITAMINB12, FOLATE, FERRITIN, TIBC, IRON, RETICCTPCT in the last 72 hours.  Coagulation profile Recent Labs  Lab 10/04/2017 1146  INR 1.17    No results for input(s): DDIMER in the last 72 hours.  Cardiac Enzymes Recent Labs  Lab 10/28/2017 1146 10/06/17 0331 10/08/17 1436  TROPONINI 0.03* 26.74* 4.50*   ------------------------------------------------------------------------------------------------------------------    Component Value Date/Time   BNP 135.9 (H) 10/07/2017 0240    Micro Results Recent Results (from the past 240 hour(s))  MRSA PCR Screening     Status: Abnormal   Collection Time: 10/19/2017   3:45 PM  Result Value Ref Range Status   MRSA by PCR POSITIVE (A) NEGATIVE Final    Comment:        The GeneXpert MRSA Assay (FDA approved for NASAL specimens only), is one component of a comprehensive MRSA colonization surveillance program. It is not intended to diagnose MRSA infection nor to guide or monitor treatment for MRSA infections. RESULT CALLED TO, READ BACK BY AND VERIFIED WITH: M.  Meyer RN 17:20 11/01/2017 ([email or web address]) Performed at [facility], [place] [address], [place], [place] [address]   Stat Gram stain     Status: None   Collection Time: 10/08/17  7:31 PM  Result Value Ref Range Status   Specimen Description PERITONEAL  Final   Special Requests NONE  Final   Gram Stain   Final    FEW WBC PRESENT,BOTH PMN AND MONONUCLEAR NO ORGANISMS SEEN Performed at [facility], [address], [place], [place] [address]    Report Status 10/08/2017 FINAL  Final  Culture, body fluid-bottle     Status: None (Preliminary result)   Collection Time: 10/08/17  7:31 PM  Result Value Ref Range Status   Specimen Description PERITONEAL  Final   Special Requests NONE  Final   Culture   Final    NO GROWTH 2 DAYS Performed at [facility] [address] [place], [place] [address]    Report Status PENDING  Incomplete    Radiology Reports Ct Angio Chest Pe W Or Wo Contrast  Result Date: 10/08/2017 CLINICAL DATA:  Worsening shortness of breath today. EXAM: CT ANGIOGRAPHY CHEST WITH CONTRAST TECHNIQUE: Multidetector CT imaging of the chest was performed using the standard protocol during bolus administration of intravenous contrast. Multiplanar CT image reconstructions and MIPs were obtained to evaluate the vascular anatomy. CONTRAST:  198mL ISOVUE-370 IOPAMIDOL (ISOVUE-370) INJECTION 76% COMPARISON:  Portable chest obtained earlier today. Abdomen ultrasound dated 10/12/2017. FINDINGS: Cardiovascular: Normally opacified pulmonary arteries with no pulmonary arterial filling  defects seen. Atheromatous calcifications, including the coronary arteries and aorta. Mediastinum/Nodes: No enlarged mediastinal, hilar, or axillary lymph nodes. Thyroid gland, trachea, and esophagus demonstrate no significant findings. Lungs/Pleura: Mild bullous changes. Bilateral lower lobe atelectasis. Moderate-sized right pleural effusion and small left pleural effusion. Upper Abdomen: Moderate to large amount of free peritoneal fluid. The included portion of the liver has mildly lobulated contours with an enlarged lateral segment left lobe. Musculoskeletal: Thoracic and lower cervical spine degenerative changes with diffuse ankylosis. Review of the MIP images confirms the above findings. IMPRESSION: 1. No pulmonary emboli. 2. Moderate-sized right pleural effusion and small left pleural effusion. 3. Bilateral lower lobe atelectasis. 4. Moderate to large amount of free peritoneal fluid. 5. Changes of cirrhosis of the liver. 6. Mild changes of COPD. 7. Atheromatous calcifications, including the coronary arteries and aorta. Aortic Atherosclerosis (ICD10-I70.0) and Emphysema (ICD10-J43.9). Electronically Signed   By: Claudie Revering M.D.   On: 10/08/2017 18:40   Ct Abdomen W Contrast  Result Date: 10/29/2017 CLINICAL DATA:  Abdominal distension. Short of breath. Recent heart catheterization. History of small bowel resection 1960 EXAM: CT ABDOMEN WITH CONTRAST TECHNIQUE: Multidetector CT imaging of the abdomen was performed using the standard protocol following bolus administration of intravenous contrast. CONTRAST:  139mL ISOVUE-300 IOPAMIDOL (ISOVUE-300) INJECTION 61%, <See Chart> ISOVUE-300 IOPAMIDOL (ISOVUE-300) INJECTION 61% COMPARISON:  None. FINDINGS: Lower chest: Bilateral pleural effusions which are small to moderate and greater on the RIGHT. There is associated bibasilar atelectasis Hepatobiliary: Liver has a fine nodular contour. Caudate lobe is prominent. Portal veins patent. Gallbladder normal. Pancreas:  Normal pancreatic parenchymal intensity. No ductal dilatation or inflammation. Spleen: Normal spleen. Adrenals/urinary tract: Small LEFT adrenal adenoma. No renal obstruction. Low-density lesions in the renal cortex likely represent benign cysts Stomach/Bowel: NG tube extends into the gastric antrum. Stomach, duodenum and limited view of the bowel and colon demonstrate no acute findings. Is moderate volume fluid in the peritoneal space along the pericolic  gutters in surrounding the liver and spleen. Vascular/Lymphatic: Abdominal aortic normal caliber with intimal calcifications. There is central mesenteric mass positioned between the superior mesenteric vein and the third portion duodenum measuring 5.1 x 3.2 cm (image 53/3). Lesion also seen on coronal image 86/6. Lesion abuts the uncinate of the pancreas. Musculoskeletal: No aggressive osseous lesion IMPRESSION: 1. Potential mass lesion in the central mesentery positioned between the SMV and second portion duodenum. Mass suggests lymphoma, gastrointestinal stromal tumor, primary duodenal carcinoma, carcinoid tumor, atypical pancreatic carcinoma or metastatic nodal conglomerate from unknown primary. Lower abdomen and pelvis not included on exam. Consider endoscopic ultrasound (EUS) evaluation and tumor sampling. FDG PET scan may be beneficial on outpatient basis. 2. Nodule liver with ascites suggests cirrhosis. 3. Normal bilateral pleural effusions and associated atelectasis. Electronically Signed   By: Suzy Bouchard M.D.   On: 10/21/2017 13:02   US Abdomen Complete  Result Date: 10/06/2017 CLINICAL DATA:  Abdomen distension EXAM: ABDOMEN ULTRASOUND COMPLETE COMPARISON:  Ultrasound 09/12/2015, 09/06/2013 FINDINGS: Gallbladder: No gallstones or wall thickening visualized. No sonographic Murphy sign noted by sonographer. Common bile duct: Diameter: 4.5 mm Liver: Nodular contour with heterogeneous echotexture consistent with cirrhosis. Diffuse increased  echogenicity with probable fatty sparing near the gallbladder fossa. Portal vein is patent on color Doppler imaging with normal direction of blood flow towards the liver. IVC: No abnormality visualized. Pancreas: Poorly visualized due to gas. Spleen: Size and appearance within normal limits. Right Kidney: Length: 12.4 cm. Cortical echogenicity within normal limits. No hydronephrosis. Small cyst at the mid to lower pole measuring 1.7 cm. Left Kidney: Length: 11.7 cm. Cortical echogenicity within normal limits. 1.4 cm hypoechoic lesion upper pole left kidney, indeterminate. Abdominal aorta: No aneurysm visualized.  Maximum diameter 2.6 cm. Other findings: Small amount of ascites. IMPRESSION: 1. Cirrhosis of the liver.  Small amount of abdominal ascites. 2. Negative for gallstones or biliary dilatation 3. 1.4 cm hypoechoic lesion upper pole left kidney, complex cystic versus small solid nodule. Could further evaluate with dedicated renal CT or MRI when clinically feasible. Electronically Signed   By: Donavan Foil M.D.   On: 10/06/2017 23:51   Dg Chest Port 1 View  Result Date: 10/21/2017 CLINICAL DATA:  Sob today,hx NG present ,abd distention too EXAM: PORTABLE CHEST 1 VIEW COMPARISON:  Chest x-ray dated 10/24/2017. FINDINGS: Study is hypoinspiratory with crowding of the perihilar and bibasilar bronchovascular markings, similar to previous exam. No new lung findings. No pleural effusion or pneumothorax seen. Heart size and mediastinal contours appear stable. NG tube passes below the diaphragm. IMPRESSION: Low lung volumes, similar to previous study. No new findings. No evidence of pneumonia or pulmonary edema. Electronically Signed   By: Franki Cabot M.D.   On: 10/29/2017 10:45   Dg Chest Portable 1 View  Result Date: 10/30/2017 CLINICAL DATA:  Shortness of breath.  Chest pain. EXAM: PORTABLE CHEST 1 VIEW COMPARISON:  Chest x-ray report 04/12/2013. FINDINGS: Two images obtained. The second image with deeper  inspiration. Right costophrenic angle not imaged. Heart size normal. Low lung volumes with mild bibasilar atelectasis. No pleural effusion or pneumothorax. Degenerative changes scoliosis thoracic spine. IMPRESSION: Low lung volumes with mild bibasilar atelectasis. Electronically Signed   By: Marcello Moores  Register   On: 10/17/2017 12:14   Dg Abd Portable 1v  Result Date: 10/24/2017 CLINICAL DATA:  NG tube placement EXAM: PORTABLE ABDOMEN - 1 VIEW COMPARISON:  10/04/2017 FINDINGS: Insertion of esophageal tube, tip and side port overlie the gastric body. Decreased gastric distention. Otherwise  nonobstructed gas pattern. Contrast within the bladder. IMPRESSION: Esophageal tube tip overlies the gastric body. There is decreased gastric distention. Electronically Signed   By: Donavan Foil M.D.   On: 10/28/2017 20:21   Dg Abd Portable 1v  Result Date: 10/11/2017 CLINICAL DATA:  Abdomen distension EXAM: PORTABLE ABDOMEN - 1 VIEW COMPARISON:  Chest x-ray 10/24/2017 FINDINGS: Central gas collection presumably represents air-filled dilated stomach. Remaining gas pattern nonobstructed. Radiopaque contrast within the bladder. IMPRESSION: Overall nonobstructed gas pattern. Prominent central gas collection presumably reflects moderate gaseous enlargement of the stomach Electronically Signed   By: Donavan Foil M.D.   On: 10/13/2017 19:30    Time Spent in minutes  30   Lala Lund M.D on 10/10/2017 at 1:48 PM  To page go to [email or web address] - password [email or web address]

## 2017-10-10 NOTE — Plan of Care (Signed)
Patient progressing with the plan of care will continue to monitor and modify as needed.

## 2017-10-10 NOTE — Progress Notes (Signed)
Subjective:  Continues to have abdominal distension Underwent 1.5 L paracentesis on 10/09/17. Intermittent episodes of shortness of breath. although no hypoxia.   New diagnosis of abdominal mass on CT abdomnen  Objective:  Vital Signs in the last 24 hours: Temp:  [97.6 F (36.4 C)-98.6 F (37 C)] 97.8 F (36.6 C) (09/08 0354) Pulse Rate:  [114-127] 114 (09/08 0354) Resp:  [20-25] 21 (09/08 0354) BP: (100-114)/(67-92) 108/76 (09/08 0354) SpO2:  [94 %-96 %] 95 % (09/08 0354) Weight:  [117.8 kg] 117.8 kg (09/08 0354)  Intake/Output from previous day: 09/07 0701 - 09/08 0700 In: 840 [P.O.:840] Out: 500 [Urine:500] Intake/Output from this shift: No intake/output data recorded.  Physical Exam: Constitutional: He is oriented to person, place, and time. He appears well-developed and well-nourished. He appears in no distress  HENT:  Head: Atraumatic.  Eyes: Conjunctivae are normal.  Neck: Neck supple. No JVD present.  Cardiovascular: Normal rate, regular rhythm and normal heart sounds. Tachycardic No murmur heard. Pulses: Intact distal pulses, except absent PT b/l Respiratory: Remains mildly tachypnic Abdominal: Soft. Bowel sounds are normal.  Abdominal distension. No tenderness. Musculoskeletal: He exhibits no edema or deformity.  Lymphadenopathy:    He has no cervical adenopathy.  Neurological: He is alert and oriented to person, place, and time.  Skin: Skin is warm and dry.  Psychiatric: He has a normal mood and affect.   Lab Results: Recent Labs    10/09/17 0209 10/10/17 0413  WBC 10.8* 12.4*  HGB 13.4 13.5  PLT 247 281   Recent Labs    10/09/17 0209 10/10/17 0413  NA 132* 130*  K 4.1 4.2  CL 96* 95*  CO2 27 25  GLUCOSE 199* 198*  BUN 15 25*  CREATININE 1.08 1.14   Recent Labs    10/08/17 1436  TROPONINI 4.50*   CT Abdomen w/contrast 10/30/2017: 1. Potential mass lesion in the central mesentery positioned between the SMV and second portion duodenum.  Mass suggests lymphoma, gastrointestinal stromal tumor, primary duodenal carcinoma, carcinoid tumor, atypical pancreatic carcinoma or metastatic nodal conglomerate from unknown primary. Lower abdomen and pelvis not included on exam. Consider endoscopic ultrasound (EUS) evaluation and tumor sampling. FDG PET scan may be beneficial on outpatient basis. 2. Nodule liver with ascites suggests cirrhosis. 3. Normal bilateral pleural effusions and associated atelectasis.  Cardiac Studies: EKG 10/06/2017: Sinus tachycardia 100 bpm. Age indeterminate anrteroseptal and inferior infarct. ST elevation improved compared to previous EKG.   Echocardiogram 10/04/2017: Study Conclusions  - Left ventricle: The cavity size was normal. There was mild   concentric hypertrophy. Systolic function was moderately reduced.   The estimated ejection fraction was in the range of 35% to 40%.   Severe hypokinesis of the anteroseptal and apical myocardium.   Features are consistent with a pseudonormal left ventricular   filling pattern, with concomitant abnormal relaxation and   increased filling pressure (grade 2 diastolic dysfunction). - Aortic valve: Mildly calcified annulus. - No hemodynamically significant valvular abnormality.   No evidence of pulmonary hypertension.   LM: Normal LAD: Prox LAD severely calcified 80% stenosis (Non-culprit)         Mid LAD 100% ISR (Culptit stenosis) LCx: Mild prox disease RCA: Patent prox-mid stent. Focal 50% restenosis  Successful PTCA and stent placement, plus aspiration thrombectomy  RA: 12 mmHg PA: 34/22 mmHg Mean PA 27 mmHg PW: 17 mmHg  LV 84/9 mmHg, LVEDP 16 mmHg  CO: 6.1 L/min CI 2.5 L/min/m2  No cardiogenic shock  Cath 10/18/2017: LM: Normal  LAD: Prox LAD calcified 80% stenosis. Stable, unchanged compared to cath on 09/03          Patent mid LAD stents, no significant restenosis LCx: Mild prox disease RCA: Mild prox ISR  No changes compared  to post PCI angiographic images on 09/03 LVEDP 8 mmHg EKG changes possible related to post MI LV aneurysmal changes.  Recommendation: Continue aspirin 81 mg, Effient 10 mg daily. Continue workup for patient's abdominal distension.  Assessment/Plan:  67 year old Caucasian male with morbid obesity, hypertension, type 2 diabetes mellitus, on chronic narcotic medications for back pain, CAD status post prior LAD and RCA PCI, admitted with anterior STEMI 09/03 s/p PCI, hemochromatosis with liver cirrhosis followed by Dr. Benson Norway, now with ascites, new diagnosis of abdominal mass-possibly malignant, pesistent sinus tachycardia.  STEMI:  Culprit vessel mid LAD s/p aspiration thrombectomy and PCI Synergy DES 3.0 X 12 mm. Residual prox LAD severely calcified 80% stenosis that would ideally need staged atherectomy. However, the plan would have to be on hold pending more infromation on diagnosis and prognosis of abdominal mass.  Repeat cath 09/05 due to STE V1-V3. Patent mid LAD Stents with no stent thrombosis. STE possibly due to LV aneurysmal changes. Conitnue aspirin/Effient  HFrEF: Post MI. EF 35-40% EF 35-40%. Continue losartan to 25 mg. He is not in cardiogenic shock, as LVEDP is low at 8 mmHg. Suspect tachycardia is at least partially due to abdominal pathology.  Introduce low dose metoprolol today.  Not on ACEi/ARB in lieu of using spironolactone for his ascites.   Sinus tachycardia: Not due to cardiogenic shock. No PE on CTA. Possible due to sepsis or underlying abdominal pathology/possible abdominal compartment syndrome? Could introduce low dose metoprolol today 12.5 mg bid.  Abdominal mass/ascites: He is at high risk of stent thrombosis few days post primary PCI for STEMI making interruption of antiplatelet therapy difficult. Will need to weigh risks/benefits and timing for the biopsy. Possibly 1-3 months from now.   Appreciate hospitalist service for taking over care of this unfortunately  ill patient with multiple medical comorbidities with ongoing management required for his ascites. I will continue to follow along the patient with you.   LOS: 5 days    Manish J Patwardhan 10/10/2017, 10:03 AM  Kendrick, MD [facility] [place] Pager: [phone] Office: [phone] If no answer Cell [phone]

## 2017-10-11 ENCOUNTER — Inpatient Hospital Stay (HOSPITAL_COMMUNITY): Payer: Medicare Other

## 2017-10-11 ENCOUNTER — Encounter: Payer: Medicare Other | Attending: Physical Medicine & Rehabilitation | Admitting: Physical Medicine & Rehabilitation

## 2017-10-11 ENCOUNTER — Other Ambulatory Visit: Payer: Self-pay

## 2017-10-11 DIAGNOSIS — N400 Enlarged prostate without lower urinary tract symptoms: Secondary | ICD-10-CM | POA: Insufficient documentation

## 2017-10-11 DIAGNOSIS — M7522 Bicipital tendinitis, left shoulder: Secondary | ICD-10-CM | POA: Insufficient documentation

## 2017-10-11 DIAGNOSIS — Z87891 Personal history of nicotine dependence: Secondary | ICD-10-CM | POA: Insufficient documentation

## 2017-10-11 DIAGNOSIS — Z955 Presence of coronary angioplasty implant and graft: Secondary | ICD-10-CM | POA: Insufficient documentation

## 2017-10-11 DIAGNOSIS — I10 Essential (primary) hypertension: Secondary | ICD-10-CM | POA: Insufficient documentation

## 2017-10-11 DIAGNOSIS — F419 Anxiety disorder, unspecified: Secondary | ICD-10-CM | POA: Insufficient documentation

## 2017-10-11 DIAGNOSIS — K219 Gastro-esophageal reflux disease without esophagitis: Secondary | ICD-10-CM | POA: Insufficient documentation

## 2017-10-11 DIAGNOSIS — E7800 Pure hypercholesterolemia, unspecified: Secondary | ICD-10-CM | POA: Insufficient documentation

## 2017-10-11 DIAGNOSIS — M1990 Unspecified osteoarthritis, unspecified site: Secondary | ICD-10-CM | POA: Insufficient documentation

## 2017-10-11 DIAGNOSIS — M109 Gout, unspecified: Secondary | ICD-10-CM | POA: Insufficient documentation

## 2017-10-11 DIAGNOSIS — G8929 Other chronic pain: Secondary | ICD-10-CM | POA: Insufficient documentation

## 2017-10-11 DIAGNOSIS — K449 Diaphragmatic hernia without obstruction or gangrene: Secondary | ICD-10-CM | POA: Insufficient documentation

## 2017-10-11 DIAGNOSIS — E119 Type 2 diabetes mellitus without complications: Secondary | ICD-10-CM | POA: Insufficient documentation

## 2017-10-11 DIAGNOSIS — M452 Ankylosing spondylitis of cervical region: Secondary | ICD-10-CM | POA: Insufficient documentation

## 2017-10-11 DIAGNOSIS — E669 Obesity, unspecified: Secondary | ICD-10-CM | POA: Insufficient documentation

## 2017-10-11 DIAGNOSIS — I2510 Atherosclerotic heart disease of native coronary artery without angina pectoris: Secondary | ICD-10-CM | POA: Insufficient documentation

## 2017-10-11 DIAGNOSIS — M456 Ankylosing spondylitis lumbar region: Secondary | ICD-10-CM | POA: Insufficient documentation

## 2017-10-11 DIAGNOSIS — F329 Major depressive disorder, single episode, unspecified: Secondary | ICD-10-CM | POA: Insufficient documentation

## 2017-10-11 LAB — COMPREHENSIVE METABOLIC PANEL
ALT: 63 U/L — ABNORMAL HIGH (ref 0–44)
AST: 105 U/L — AB (ref 15–41)
Albumin: 2.6 g/dL — ABNORMAL LOW (ref 3.5–5.0)
Alkaline Phosphatase: 55 U/L (ref 38–126)
Anion gap: 10 (ref 5–15)
BUN: 27 mg/dL — ABNORMAL HIGH (ref 8–23)
CHLORIDE: 96 mmol/L — AB (ref 98–111)
CO2: 24 mmol/L (ref 22–32)
Calcium: 8.2 mg/dL — ABNORMAL LOW (ref 8.9–10.3)
Creatinine, Ser: 1.05 mg/dL (ref 0.61–1.24)
GFR calc non Af Amer: >60 mL/min (ref >60)
Glucose, Bld: 138 mg/dL — ABNORMAL HIGH (ref 70–99)
POTASSIUM: 5.5 mmol/L — AB (ref 3.5–5.1)
Sodium: 130 mmol/L — ABNORMAL LOW (ref 135–145)
Total Bilirubin: 1.9 mg/dL — ABNORMAL HIGH (ref 0.3–1.2)
Total Protein: 5.4 g/dL — ABNORMAL LOW (ref 6.5–8.1)

## 2017-10-11 LAB — LYMPHOCYTE SUBSETS, FLOW CYTOMETRY (INPT)

## 2017-10-11 LAB — CBC
HCT: 42.2 % (ref 39.0–52.0)
Hemoglobin: 13.7 g/dL (ref 13.0–17.0)
MCH: 28.8 pg (ref 26.0–34.0)
MCHC: 32.5 g/dL (ref 30.0–36.0)
MCV: 88.7 fL (ref 78.0–100.0)
PLATELETS: 324 10*3/uL (ref 150–400)
RBC: 4.76 MIL/uL (ref 4.22–5.81)
RDW: 14 % (ref 11.5–15.5)
WBC: 13.9 10*3/uL — AB (ref 4.0–10.5)

## 2017-10-11 LAB — GLUCOSE, CAPILLARY
GLUCOSE-CAPILLARY: 139 mg/dL — AB (ref 70–99)
Glucose-Capillary: 279 mg/dL — ABNORMAL HIGH (ref 70–99)
Glucose-Capillary: 221 mg/dL — ABNORMAL HIGH (ref 70–99)
Glucose-Capillary: 185 mg/dL — ABNORMAL HIGH (ref 70–99)

## 2017-10-11 LAB — MAGNESIUM: MAGNESIUM: 2.1 mg/dL (ref 1.7–2.4)

## 2017-10-11 MED ORDER — IPRATROPIUM-ALBUTEROL 0.5-2.5 (3) MG/3ML IN SOLN
3.0000 mL | RESPIRATORY_TRACT | Status: DC | PRN
  Administered 2017-10-11: 3 mL via RESPIRATORY_TRACT
  Filled 2017-10-11 (×2): qty 3

## 2017-10-11 MED ORDER — LIDOCAINE HCL (PF) 2 % IJ SOLN
INTRAMUSCULAR | Status: AC
  Filled 2017-10-11: qty 20

## 2017-10-11 MED ORDER — SODIUM CHLORIDE 0.45 % IV SOLN
INTRAVENOUS | Status: DC
  Administered 2017-10-11: 16:00:00 via INTRAVENOUS

## 2017-10-11 MED ORDER — ACETAMINOPHEN 500 MG PO TABS
500.0000 mg | ORAL_TABLET | ORAL | Status: DC | PRN
  Administered 2017-10-22: 500 mg via ORAL
  Filled 2017-10-11: qty 1

## 2017-10-11 NOTE — Progress Notes (Signed)
CARDIAC REHAB PHASE I   PRE:  Rate/Rhythm: 114 ST  BP:  Sitting: 117/73      SaO2: 96 RA  MODE:  Ambulation: 470 ft    131 peak HR  POST:  Rate/Rhythm: 110 ST  BP:  Sitting: 103/55    SaO2: 100 RA   Pt ambulated 427ft in hallway assist of 2 with gait belt and front wheel walker. Pt c/o SOB, arm tiredness, and hip pain. Pt took 1 standing rest break. Helped pt with dentures, returned to bedside chair. Call bell and phone within reach. Will continue to follow.  [id number] Rufina Falco, RN BSN 10/11/2017 11:01 AM

## 2017-10-11 NOTE — Progress Notes (Signed)
[facility] TEAM 1 - Stepdown/ICU TEAM  Daniel Rivers  OXB:[id number] DOB: 12/18/1950 DOA: 10/10/2017 PCP: [name], MD    Brief Narrative:  67 year old M w/ a hx of ankylosing spondylitis, anxiety, BPH, chronic back pain, CAD, GERD, dyslipidemia, obesity, nonalcoholic cirrhosis, hemochromatosis, and DM2 who was admitted by Cardiology on 10/20/2017 for a STEMI requiring a stent placement. Subsequently he developed abdominal pain and distention and was found to have ascites, GI was consulted. The patient was transferred to the [facility] service on 10/09/2017 for management of his medical issues.  The patient has recurrent ascites, he is hypotensive and tachycardic.  Significant Events: 10/29/2017 - Left heart catheterization with LAD DES placement  10/31/2017 - TTE -  EF 35% to 40%.Severe hypokinesis of the anteroseptal and apical myocardium - grade 2 diastolic dysfunction 07/10/32 [place] - Cirrhosis of the liver - small amount of abdominal ascites - 1.4 cm hypoechoic lesion upper pole left kidney, complex cystic versus small solid nodule 10/18/2017  CTchest  No pulmonary emboli 10/08/17 TTE - mild LVH - EF 45% - akinesis of the apical septal wall, the apicalanterior wall, and the true apex 10/08/17 -  Ultrasound-guided paracentesis by IR yielding 1 L of fluid.  Neutrophil count close to 200, SAAG score cannot be calculated as there was no serum albumin from that day. 10/08/17 CT potential mass lesion in the central mesentery positioned between the SMV and second portion duodenum. Consider endoscopic ultrasound (EUS) evaluation and tumor sampling. FDG PET scan may be beneficial on outpatient basis.  10/11/2017 Repeat paracentesis   Subjective: C/o difficulty with SOB due to distended abdomen. Denies cp, n/v, or abdom pain.     Assessment & Plan:  STEMI caused by LAD lesion requiring DES placement this admission - on dual antiplatelet therapy  Hemochromatosis induced cirrhosis w/ large recurrent ascites had a therapeutic  and diagnostic paracentesis 9/6 with 1 L of fluid removed, neutrophil count around 200 - to have repeat today given rapid recurrence - follow   Shortness of breath / abdom compartment syndrome As above   Hypotension, tachycardia severely intravascularly dehydrated, but complicated by marked ascites - care w/ diuretic/fluids - volume balance will be quite difficult   Hyponatremia and hypochloremia Due to above - follow in serial fashion - w/ climbing K+ will have to stop aldactone   Moderate to severe protein calorie malnutrition Cont Protostat  Dyslipidemia On statin  Left upper pole kidney cyst Outpatient PCP follow-up  Potential abdominal mass For outpt f/u and consideration to other imaging   DM2 Reasonably controlled at this time   MRSA screen +  DVT prophylaxis: [place] heparin  Code Status: FULL CODE Family Communication: no family present at time of exam  Disposition Plan: SDU  Consultants:  GI PCCM Cardiology   Antimicrobials:  Rocephin 9/6 >  Objective: Blood pressure 117/73, pulse (!) 117, temperature 97.7 F (36.5 C), temperature source Oral, resp. rate (!) 25, height 6\' 4"  (1.93 m), weight 120.7 kg, SpO2 100 %.  Intake/Output Summary (Last 24 hours) at 10/11/2017 1324 Last data filed at 10/11/2017 1219 Gross per 24 hour  Intake 2228.46 ml  Output 475 ml  Net 1753.46 ml   Filed Weights   10/09/17 0237 10/10/17 0354 10/11/17 0355  Weight: 115.3 kg 117.8 kg 120.7 kg    Examination: General: tachypneic - not in distress but clearly SOB Lungs: Clear to auscultation bilaterally without wheezes or crackles Cardiovascular: tachycardic - regular - no M or rub  Abdomen: markedly distended,  no rebound, taught, BS+ Extremities: No significant cyanosis, clubbing, or edema bilateral lower extremities  CBC: Recent Labs  Lab 10/20/2017 1146  10/06/17 0331  10/09/17 0209 10/10/17 0413 10/11/17 0344  WBC 10.2  --  9.9   < > 10.8* 12.4* 13.9*  NEUTROABS  6.2  --  6.6  --   --   --   --   HGB 13.9   < > 13.7   < > 13.4 13.5 13.7  HCT 42.9   < > 41.0   < > 40.4 41.6 42.2  MCV 89.4  --  87.8   < > 88.2 88.3 88.7  PLT 254  --  243   < > 247 281 324   < > = values in this interval not displayed.   Basic Metabolic Panel: Recent Labs  Lab 10/09/17 0209 10/10/17 0413 10/11/17 0344  NA 132* 130* 130*  K 4.1 4.2 5.5*  CL 96* 95* 96*  CO2 27 25 24   GLUCOSE 199* 198* 138*  BUN 15 25* 27*  CREATININE 1.08 1.14 1.05  CALCIUM 8.4* 8.2* 8.2*  MG  --   --  2.1   GFR: Estimated Creatinine Clearance: 96.9 mL/min (by C-G formula based on SCr of 1.05 mg/dL).  Liver Function Tests: Recent Labs  Lab 10/13/2017 1146 10/10/17 0413 10/11/17 0344  AST 31 50* 105*  ALT 25 44 63*  ALKPHOS 47 48 55  BILITOT 1.8* 1.0 1.9*  PROT 6.0* 5.7* 5.4*  ALBUMIN 3.0* 2.7* 2.6*    Coagulation Profile: Recent Labs  Lab 10/07/2017 1146  INR 1.17    Cardiac Enzymes: Recent Labs  Lab 10/24/2017 1146 10/06/17 0331 10/08/17 1436  TROPONINI 0.03* 26.74* 4.50*    HbA1C: Hgb A1c MFr Bld  Date/Time Value Ref Range Status  10/06/2017 03:31 AM 6.9 (H) 4.8 - 5.6 % Final    Comment:    (NOTE) Pre diabetes:          5.7%-6.4% Diabetes:              >6.4% Glycemic control for   <7.0% adults with diabetes   05/22/2014 03:10 PM 6.5 (H) 4.8 - 5.6 % Final    Comment:    (NOTE)         Pre-diabetes: 5.7 - 6.4         Diabetes: >6.4         Glycemic control for adults with diabetes: <7.0     CBG: Recent Labs  Lab 10/10/17 1232 10/10/17 1540 10/10/17 2046 10/11/17 0550 10/11/17 1205  GLUCAP 258* 227* 169* 139* 279*    Recent Results (from the past 240 hour(s))  MRSA PCR Screening     Status: Abnormal   Collection Time: 10/24/2017  3:45 PM  Result Value Ref Range Status   MRSA by PCR POSITIVE (A) NEGATIVE Final    Comment:        The GeneXpert MRSA Assay (FDA approved for NASAL specimens only), is one component of a comprehensive MRSA  colonization surveillance program. It is not intended to diagnose MRSA infection nor to guide or monitor treatment for MRSA infections. RESULT CALLED TO, READ BACK BY AND VERIFIED WITH: Angelica Chessman RN 17:20 10/07/2017 ([email or web address]) Performed at [facility], [place] [address], [place],  [address]   Stat Gram stain     Status: None   Collection Time: 10/08/17  7:31 PM  Result Value Ref Range Status   Specimen Description PERITONEAL  Final  Special Requests NONE  Final   Gram Stain   Final    FEW WBC PRESENT,BOTH PMN AND MONONUCLEAR NO ORGANISMS SEEN Performed at [facility], [place] [address] [place], [place] [address]    Report Status 10/08/2017 FINAL  Final  Culture, body fluid-bottle     Status: None (Preliminary result)   Collection Time: 10/08/17  7:31 PM  Result Value Ref Range Status   Specimen Description PERITONEAL  Final   Special Requests NONE  Final   Culture   Final    NO GROWTH 3 DAYS Performed at [place] [address], [place], [place] [address]    Report Status PENDING  Incomplete  Culture, Urine     Status: None   Collection Time: 10/09/17  7:47 PM  Result Value Ref Range Status   Specimen Description URINE, RANDOM  Final   Special Requests NONE  Final   Culture   Final    NO GROWTH Performed at [facility], [place] [address], [place], [place] [address]    Report Status 10/10/2017 FINAL  Final     Scheduled Meds: . aspirin EC  81 mg Oral Daily  . atorvastatin  40 mg Oral q1800  . bisacodyl  10 mg Oral QHS  . buPROPion  300 mg Oral Daily  . docusate sodium  100 mg Oral Daily  . feeding supplement (GLUCERNA SHAKE)  237 mL Oral TID BM  . feeding supplement (PRO-STAT SUGAR FREE 64)  30 mL Oral Daily  . heparin injection (subcutaneous)  5,000 Units Subcutaneous Q8H  . Influenza vac split quadrivalent PF  0.5 mL Intramuscular Tomorrow-1000  . insulin aspart  0-15 Units Subcutaneous TID WC  . insulin aspart  4 Units Subcutaneous TID  WC  . insulin detemir  10 Units Subcutaneous QHS  . metoprolol tartrate  12.5 mg Oral BID  . naloxegol oxalate  12.5 mg Oral Daily  . polyethylene glycol  17 g Oral BID  . prasugrel  10 mg Oral Daily  . psyllium  1 packet Oral BID  . sodium chloride flush  3 mL Intravenous Q12H  . spironolactone  25 mg Oral Daily  . zolpidem  5 mg Oral QHS   Continuous Infusions: . cefTRIAXone (ROCEPHIN)  IV 2 g (10/10/17 2100)  . lactated ringers 100 mL/hr at 10/11/17 0400     LOS: 6 days   Cherene Altes, MD [facility] Office  [phone] Pager - Text Page per Amion  If 7PM-7AM, please contact night-coverage per Amion 10/11/2017, 1:24 PM

## 2017-10-11 NOTE — Consult Note (Signed)
[facility] CM Primary Care Navigator  10/11/2017  Daniel Rivers 1950-10-09 [id number]   Met withpatientat the bedsideto identify possible discharge needs. Patientreports thathe had"severe chest pain- feels like elephant seating on my chest- radiating to jaw and both arms, shortness of breath and diaphoresis" that had led to this admission. (STEMI- ST elevated myocardial infarction requiring stent) placement)  PatientendorsesDr. [name] with [place] primary care provider.He mentioned of his plan to find a new primary care provider after discharge.   [facility] [address] [facility] [address] (prescriptions)to obtainmedications withoutdifficulty.  Patient reportsmanaginghisownmedications at [facility] use of "pill box" system filled once a week.  Patient hasbeendrivingprior to admission but his friend [name]), uncle [name]) and son [name]) can be able providetransportationto his doctors' appointments after discharge.   Patientstatesthathe lives alone but had pre-arranged for grandson ([name]) to stay with him and will serve as his primary caregiverat home after discharge.  Anticipated discharge plan ishome per patient and will be on cardiac rehab once recovered.  Patient voiced understanding to call primary care provider'soffice whenhegets back home for a post discharge follow-up appointment within1- 2 weeksor sooner if needs arise.Patient letter (with PCP's contact number) was provided ashisreminder.  Explained topatientregardingTHN CM services available for health management andresourcesat home,buthestates that he has been managinghishealth issues at home by monitoring/ recording blood sugar and blood pressure, trying to adhere with diet, taking medications regularly, staying active (walking) and following-up with provider when needed.  Patienthad  politely declined [facility]-CM services offeredwhich includes EMMIcalls to follow-up with his recovery at home. Patient was encouragedto seekreferral to [facility] care management from primary care providerifdeemed necessary and appropriate foranyservicesin thenear future.   [facility] care management information provided for future needs thathemay have.   For additional questions please contact:  Edwena Felty A. Matteus Mcnelly, BSN, RN-BC [facility] PRIMARY CARE Navigator Cell: [phone]

## 2017-10-11 NOTE — Progress Notes (Signed)
Patient ID: KAWHI DIEBOLD, male   DOB: 11-03-1950, 67 y.o.   MRN: [id number] Pt presented to [place] dept today for paracentesis; limited US of abd in all four quadrants  reveals only small amount ascites which is not safely amenable to aspirate at this time due to close proximity to bowel loops. Procedure cancelled.

## 2017-10-11 NOTE — Progress Notes (Signed)
Subjective:  Continues to have abdominal distension s/p recurrent paracenteses. Intermittent episodes of shortness of breath. although no hypoxia.   Objective:  Vital Signs in the last 24 hours: Temp:  [97.5 F (36.4 C)-98.4 F (36.9 C)] 98.4 F (36.9 C) (09/09 2044) Pulse Rate:  [109-117] 115 (09/09 2044) Resp:  [17-25] 18 (09/09 2044) BP: (109-134)/(70-102) 115/76 (09/09 2044) SpO2:  [94 %-100 %] 94 % (09/09 2044) Weight:  [120.7 kg] 120.7 kg (09/09 0355)  Intake/Output from previous day: 09/08 0701 - 09/09 0700 In: 1988.5 [P.O.:480; I.V.:1508.5] Out: 800 [Urine:800] Intake/Output from this shift: Total I/O In: 360 [P.O.:360] Out: -   Physical Exam: Constitutional: He is oriented to person, place, and time. He appears well-developed and well-nourished. He appears in no distress  HENT:  Head: Atraumatic.  Eyes: Conjunctivae are normal.  Neck: Neck supple. No JVD present.  Cardiovascular: Normal rate, regular rhythm and normal heart sounds. Tachycardic No murmur heard. Pulses: Intact distal pulses, except absent PT b/l Respiratory: Remains mildly tachypnic Abdominal: Soft. Bowel sounds are normal.  Abdominal distension. No tenderness. Musculoskeletal: He exhibits no edema or deformity.  Lymphadenopathy:    He has no cervical adenopathy.  Neurological: He is alert and oriented to person, place, and time.  Skin: Skin is warm and dry.  Psychiatric: He has a normal mood and affect.   Lab Results: Recent Labs    10/10/17 0413 10/11/17 0344  WBC 12.4* 13.9*  HGB 13.5 13.7  PLT 281 324   Recent Labs    10/10/17 0413 10/11/17 0344  NA 130* 130*  K 4.2 5.5*  CL 95* 96*  CO2 25 24  GLUCOSE 198* 138*  BUN 25* 27*  CREATININE 1.14 1.05   No results for input(s): TROPONINI in the last 72 hours.  Invalid input(s): CK, MB CT Abdomen w/contrast 10/16/2017: 1. Potential mass lesion in the central mesentery positioned between the SMV and second portion duodenum.  Mass suggests lymphoma, gastrointestinal stromal tumor, primary duodenal carcinoma, carcinoid tumor, atypical pancreatic carcinoma or metastatic nodal conglomerate from unknown primary. Lower abdomen and pelvis not included on exam. Consider endoscopic ultrasound (EUS) evaluation and tumor sampling. FDG PET scan may be beneficial on outpatient basis. 2. Nodule liver with ascites suggests cirrhosis. 3. Normal bilateral pleural effusions and associated atelectasis.  Cardiac Studies: EKG 10/06/2017: Sinus tachycardia 100 bpm. Age indeterminate anrteroseptal and inferior infarct. ST elevation improved compared to previous EKG.   Echocardiogram 10/18/2017: Study Conclusions  - Left ventricle: The cavity size was normal. There was mild   concentric hypertrophy. Systolic function was moderately reduced.   The estimated ejection fraction was in the range of 35% to 40%.   Severe hypokinesis of the anteroseptal and apical myocardium.   Features are consistent with a pseudonormal left ventricular   filling pattern, with concomitant abnormal relaxation and   increased filling pressure (grade 2 diastolic dysfunction). - Aortic valve: Mildly calcified annulus. - No hemodynamically significant valvular abnormality.   No evidence of pulmonary hypertension.   LM: Normal LAD: Prox LAD severely calcified 80% stenosis (Non-culprit)         Mid LAD 100% ISR (Culptit stenosis) LCx: Mild prox disease RCA: Patent prox-mid stent. Focal 50% restenosis  Successful PTCA and stent placement, plus aspiration thrombectomy  RA: 12 mmHg PA: 34/22 mmHg Mean PA 27 mmHg PW: 17 mmHg  LV 84/9 mmHg, LVEDP 16 mmHg  CO: 6.1 L/min CI 2.5 L/min/m2  No cardiogenic shock  Cath 10/15/2017: LM: Normal LAD: Prox LAD calcified  80% stenosis. Stable, unchanged compared to cath on 09/03          Patent mid LAD stents, no significant restenosis LCx: Mild prox disease RCA: Mild prox ISR  No changes compared  to post PCI angiographic images on 09/03 LVEDP 8 mmHg EKG changes possible related to post MI LV aneurysmal changes.  Recommendation: Continue aspirin 81 mg, Effient 10 mg daily. Continue workup for patient's abdominal distension.  Assessment/Plan:  67 year old Caucasian male with morbid obesity, hypertension, type 2 diabetes mellitus, on chronic narcotic medications for back pain, CAD status post prior LAD and RCA PCI, admitted with anterior STEMI 09/03 s/p PCI, hemochromatosis with liver cirrhosis followed by Dr. Benson Norway, now with ascites, new diagnosis of abdominal mass-possibly malignant, pesistent sinus tachycardia.  STEMI:  Culprit vessel mid LAD s/p aspiration thrombectomy and PCI Synergy DES 3.0 X 12 mm. Residual prox LAD severely calcified 80% stenosis that would ideally need staged atherectomy. However, the plan would have to be on hold pending more infromation on diagnosis and prognosis of abdominal mass.  Repeat cath 09/05 due to STE V1-V3. Patent mid LAD Stents with no stent thrombosis. STE possibly due to LV aneurysmal changes. Conitnue aspirin/Effient  HFrEF: Post MI. EF 35-40% EF 35-40%. Continue losartan to 25 mg. He is not in cardiogenic shock, as LVEDP is low at 8 mmHg. Suspect tachycardia is at least partially due to abdominal pathology.  Tolerating low dose metoprolol today.  Not on ACEi/ARB in lieu of using spironolactone for his ascites.   Sinus tachycardia: Not due to cardiogenic shock. No PE on CTA. Possible due to sepsis or underlying abdominal pathology/possible abdominal compartment syndrome? Continue metoprolol 12.5 mg bid.  Abdominal mass/ascites: He is at high risk of stent thrombosis few days post primary PCI for STEMI making interruption of antiplatelet therapy difficult. Will need to weigh risks/benefits and timing for the biopsy. Possibly 1-3 months from now.   I have read Dr. Ulyses Amor notes regarding patient's shortness of breath. This appears worse than  what it was this morning. I will reassess on 09/10 morning.    LOS: 6 days    Daryus Sowash J Carvell Hoeffner 10/11/2017, 9:04 PM  Aarya Quebedeaux Esther Hardy, MD [facility]. [place] Pager: [phone] Office: [phone] If no answer Cell [phone]

## 2017-10-11 NOTE — Progress Notes (Signed)
Subjective: SOB.  Objective: Vital signs in last 24 hours: Temp:  [97.5 F (36.4 C)-97.7 F (36.5 C)] 97.7 F (36.5 C) (09/09 0822) Pulse Rate:  [109-123] 117 (09/09 1006) Resp:  [17-26] 25 (09/09 0822) BP: (101-134)/(70-102) 117/73 (09/09 1006) SpO2:  [93 %-100 %] 100 % (09/09 0904) Weight:  [120.7 kg] 120.7 kg (09/09 0355) Last BM Date: 10/10/17  Intake/Output from previous day: 09/08 0701 - 09/09 0700 In: 1988.5 [P.O.:480; I.V.:1508.5] Out: 800 [Urine:800] Intake/Output this shift: Total I/O In: 480 [P.O.:480] Out: -   General appearance: alert and labored breathing Resp: wheezes bilaterally, LUL and RUL Cardio: regular rate and rhythm, S1, S2 normal, no murmur, click, rub or gallop GI: distended, soft, tympanic  Lab Results: Recent Labs    10/09/17 0209 10/10/17 0413 10/11/17 0344  WBC 10.8* 12.4* 13.9*  HGB 13.4 13.5 13.7  HCT 40.4 41.6 42.2  PLT 247 281 324   BMET Recent Labs    10/09/17 0209 10/10/17 0413 10/11/17 0344  NA 132* 130* 130*  K 4.1 4.2 5.5*  CL 96* 95* 96*  CO2 27 25 24   GLUCOSE 199* 198* 138*  BUN 15 25* 27*  CREATININE 1.08 1.14 1.05  CALCIUM 8.4* 8.2* 8.2*   LFT Recent Labs    10/11/17 0344  PROT 5.4*  ALBUMIN 2.6*  AST 105*  ALT 63*  ALKPHOS 55  BILITOT 1.9*   PT/INR No results for input(s): LABPROT, INR in the last 72 hours. Hepatitis Panel No results for input(s): HEPBSAG, HCVAB, HEPAIGM, HEPBIGM in the last 72 hours. C-Diff No results for input(s): CDIFFTOX in the last 72 hours. Fecal Lactopherrin No results for input(s): FECLLACTOFRN in the last 72 hours.  Studies/Results: Ir Abdomen US Limited  Result Date: 10/11/2017 CLINICAL DATA:  Cirrhosis. Mesenteric mass. Ascites. Paracentesis requested EXAM: LIMITED ABDOMEN ULTRASOUND FOR ASCITES TECHNIQUE: Limited ultrasound survey for ascites was performed in all four abdominal quadrants. COMPARISON:  CT 10/13/2017 FINDINGS: Small amount of abdominal ascites. No pocket  large enough for safe paracentesis due to regional bowel. IMPRESSION: Low volume abdominal ascites.  Paracentesis deferred. Electronically Signed   By: Lucrezia Europe M.D.   On: 10/11/2017 16:16    Medications:  Scheduled: . aspirin EC  81 mg Oral Daily  . atorvastatin  40 mg Oral q1800  . bisacodyl  10 mg Oral QHS  . buPROPion  300 mg Oral Daily  . docusate sodium  100 mg Oral Daily  . feeding supplement (GLUCERNA SHAKE)  237 mL Oral TID BM  . feeding supplement (PRO-STAT SUGAR FREE 64)  30 mL Oral Daily  . heparin injection (subcutaneous)  5,000 Units Subcutaneous Q8H  . insulin aspart  0-15 Units Subcutaneous TID WC  . insulin aspart  4 Units Subcutaneous TID WC  . insulin detemir  10 Units Subcutaneous QHS  . metoprolol tartrate  12.5 mg Oral BID  . naloxegol oxalate  12.5 mg Oral Daily  . polyethylene glycol  17 g Oral BID  . prasugrel  10 mg Oral Daily  . psyllium  1 packet Oral BID  . zolpidem  5 mg Oral QHS   Continuous: . sodium chloride 10 mL/hr at 10/11/17 1614  . cefTRIAXone (ROCEPHIN)  IV 2 g (10/10/17 2100)    Assessment/Plan: 1) SOB. 2) ABM distension - ? Etiology. 3) End expiratory wheezing. 4) S/p STEMI with DES.   The repeat abdominal ultrasound was negative for any significant ascites.  Previously he had 1.5 liters removed and he felt better.  Today, he has the most labored breathing that I have seen.  There is hypoventilation and his pulse ox on room air is around 93%, but it increases with increase in his inspiration.  The abdomen is distended, but it is not clear why he continues to have SOB.  His blood pressure is still soft.  The paracentesis showed that there was 200 PMN and he was emperically started on ceftriaxone.  This does not qualify for SBP, but he was maintained on antibiotic once the culture results returned.  As of day 3, there is no evidence of any growth.  The cytology is pending for the fluid, but it is doubtful that this is a malignant ascites as  there is rapid accumulation and massive amounts of fluid.  This is not the case.  The SAAG is difficult to calculate as there was no albumin on the day of the paracentesis.  Going by the pre-paracentesis albumin on 9/3, his SAAG is 1.2.  This is consistent with a portal HTN source, but his subsequent albumin values are at 2.6 and 2.7.  This will make the SAAG <1.1 suggesting an exudative source.  There is end expiratory wheezing with the current examination.  He may benefit with a nebulizer.  Plan: 1) Consider albuterol nebulizer. 2) Start Step I diuretics when possible. 3) D/C ceftriaxone.  LOS: 6 days   Azucena Dart D 10/11/2017, 5:48 PM

## 2017-10-12 ENCOUNTER — Inpatient Hospital Stay (HOSPITAL_COMMUNITY): Payer: Medicare Other

## 2017-10-12 ENCOUNTER — Encounter (HOSPITAL_COMMUNITY): Payer: Self-pay | Admitting: Cardiology

## 2017-10-12 LAB — COMPREHENSIVE METABOLIC PANEL
ALBUMIN: 3.3 g/dL — AB (ref 3.5–5.0)
ALT: 80 U/L — AB (ref 0–44)
AST: 72 U/L — ABNORMAL HIGH (ref 15–41)
Alkaline Phosphatase: 57 U/L (ref 38–126)
Anion gap: 12 (ref 5–15)
BUN: 31 mg/dL — ABNORMAL HIGH (ref 8–23)
CHLORIDE: 96 mmol/L — AB (ref 98–111)
CO2: 21 mmol/L — AB (ref 22–32)
CREATININE: 1.12 mg/dL (ref 0.61–1.24)
Calcium: 8.6 mg/dL — ABNORMAL LOW (ref 8.9–10.3)
GFR calc Af Amer: >60 mL/min (ref >60)
GFR calc non Af Amer: >60 mL/min (ref >60)
GLUCOSE: 200 mg/dL — AB (ref 70–99)
Potassium: 4.5 mmol/L (ref 3.5–5.1)
SODIUM: 129 mmol/L — AB (ref 135–145)
Total Bilirubin: 0.8 mg/dL (ref 0.3–1.2)
Total Protein: 6 g/dL — ABNORMAL LOW (ref 6.5–8.1)

## 2017-10-12 LAB — GLUCOSE, CAPILLARY
GLUCOSE-CAPILLARY: 228 mg/dL — AB (ref 70–99)
GLUCOSE-CAPILLARY: 236 mg/dL — AB (ref 70–99)
GLUCOSE-CAPILLARY: 233 mg/dL — AB (ref 70–99)
Glucose-Capillary: 195 mg/dL — ABNORMAL HIGH (ref 70–99)

## 2017-10-12 LAB — CBC
HCT: 41.8 % (ref 39.0–52.0)
HEMOGLOBIN: 13.5 g/dL (ref 13.0–17.0)
MCH: 29 pg (ref 26.0–34.0)
MCHC: 32.3 g/dL (ref 30.0–36.0)
MCV: 89.7 fL (ref 78.0–100.0)
Platelets: 278 10*3/uL (ref 150–400)
RBC: 4.66 MIL/uL (ref 4.22–5.81)
RDW: 14 % (ref 11.5–15.5)
WBC: 13.2 10*3/uL — ABNORMAL HIGH (ref 4.0–10.5)

## 2017-10-12 LAB — PHOSPHORUS: Phosphorus: 4.2 mg/dL (ref 2.5–4.6)

## 2017-10-12 LAB — MAGNESIUM: Magnesium: 2 mg/dL (ref 1.7–2.4)

## 2017-10-12 MED ORDER — IPRATROPIUM-ALBUTEROL 0.5-2.5 (3) MG/3ML IN SOLN
3.0000 mL | Freq: Three times a day (TID) | RESPIRATORY_TRACT | Status: DC
  Administered 2017-10-13 – 2017-10-17 (×10): 3 mL via RESPIRATORY_TRACT
  Filled 2017-10-12 (×14): qty 3

## 2017-10-12 MED ORDER — INSULIN DETEMIR 100 UNIT/ML ~~LOC~~ SOLN
14.0000 [IU] | Freq: Every day | SUBCUTANEOUS | Status: DC
  Administered 2017-10-12 – 2017-10-15 (×4): 14 [IU] via SUBCUTANEOUS
  Filled 2017-10-12 (×5): qty 0.14

## 2017-10-12 MED ORDER — FUROSEMIDE 10 MG/ML IJ SOLN
40.0000 mg | Freq: Once | INTRAMUSCULAR | Status: AC
  Administered 2017-10-12: 40 mg via INTRAVENOUS
  Filled 2017-10-12: qty 4

## 2017-10-12 MED ORDER — SIMETHICONE 40 MG/0.6ML PO SUSP
80.0000 mg | Freq: Four times a day (QID) | ORAL | Status: AC
  Administered 2017-10-12 – 2017-10-13 (×4): 80 mg via ORAL
  Filled 2017-10-12 (×4): qty 1.2

## 2017-10-12 MED ORDER — IPRATROPIUM-ALBUTEROL 0.5-2.5 (3) MG/3ML IN SOLN
3.0000 mL | Freq: Four times a day (QID) | RESPIRATORY_TRACT | Status: DC
  Administered 2017-10-12 (×4): 3 mL via RESPIRATORY_TRACT
  Filled 2017-10-12 (×3): qty 3

## 2017-10-12 MED ORDER — FUROSEMIDE 10 MG/ML IJ SOLN: 40.0000 mg | Freq: Every day | INTRAMUSCULAR | Status: DC

## 2017-10-12 MED ORDER — DIAZEPAM 5 MG PO TABS
5.0000 mg | ORAL_TABLET | Freq: Three times a day (TID) | ORAL | Status: DC | PRN
  Administered 2017-10-12 – 2017-10-17 (×7): 5 mg via ORAL
  Filled 2017-10-12 (×7): qty 1

## 2017-10-12 MED ORDER — SENNOSIDES-DOCUSATE SODIUM 8.6-50 MG PO TABS
1.0000 | ORAL_TABLET | Freq: Two times a day (BID) | ORAL | Status: DC
  Administered 2017-10-12 – 2017-10-20 (×12): 1 via ORAL
  Filled 2017-10-12 (×15): qty 1

## 2017-10-12 MED FILL — Prasugrel HCl Tab 10 MG (Base Equiv): ORAL | Qty: 6 | Status: AC

## 2017-10-12 NOTE — Progress Notes (Addendum)
Inpatient Diabetes Program Recommendations  AACE/ADA: New Consensus Statement on Inpatient Glycemic Control (2015)  Target Ranges:  Prepandial:   less than 140 mg/dL      Peak postprandial:   less than 180 mg/dL (1-2 hours)      Critically ill patients:  140 - 180 mg/dL   Results for MEHAR, KIRKWOOD (MRN [id number]) as of 10/12/2017 09:41  Ref. Range 10/10/2017 06:08 10/10/2017 12:32 10/10/2017 15:40 10/10/2017 20:46 10/11/2017 05:50 10/11/2017 12:05 10/11/2017 16:40 10/11/2017 20:47 10/12/2017 06:06  Glucose-Capillary Latest Ref Range: 70 - 99 mg/dL 196 (H) 258 (H) 227 (H) 169 (H) 139 (H) 279 (H) 221 (H) 185 (H) 195 (H)   Review of Glycemic Control  Diabetes history: DM 2 Outpatient Diabetes Medications: Amaryl 3 mg QAM, Metformin 1000 BID Current orders for Inpatient glycemic control: Levemir 10 units, Novolog 0-15 units tid, Novolog 4 units tid meal coverage  Inpatient Diabetes Program Recommendations:    Fasting glucose 195. Consider increasing Levemir to 12-14 units.  Thanks,  Tama Headings RN, MSN, BC-ADM Inpatient Diabetes Coordinator Team Pager [phone] (8a-5p)

## 2017-10-12 NOTE — Progress Notes (Signed)
[place] to see pt to walk. Had checked with pt earlier and he wanted to rest a little more. Now he stated that just walking to the bathroom wears him out. Not feeling as well as yesterday. Will continue to follow. Graylon Good RN BSN 10/12/2017 1:38 PM

## 2017-10-12 NOTE — Progress Notes (Signed)
[facility] TEAM 1 - Stepdown/ICU TEAM  Daniel Rivers  EUM:[id number] DOB: 09-22-1950 DOA: 10/08/2017 PCP: [name], MD    Brief Narrative:  67 year old M w/ a hx of ankylosing spondylitis, anxiety, BPH, chronic back pain, CAD, GERD, dyslipidemia, obesity, nonalcoholic cirrhosis, hemochromatosis, and DM2 who was admitted by Cardiology on 10/12/2017 for a STEMI requiring a stent placement. Subsequently he developed abdominal pain and distention and was found to have ascites, GI was consulted. The patient was transferred to the [facility] service on 10/09/2017 for management of his medical issues.  The patient has recurrent ascites, he is hypotensive and tachycardic.  Significant Events: 10/30/2017 - Left heart catheterization with LAD DES placement  10/24/2017 - TTE -  EF 35% to 40%.Severe hypokinesis of the anteroseptal and apical myocardium - grade 2 diastolic dysfunction 06/06/98 [place] - Cirrhosis of the liver - small amount of abdominal ascites - 1.4 cm hypoechoic lesion upper pole left kidney, complex cystic versus small solid nodule 10/23/2017  CTchest  No pulmonary emboli 10/08/17 TTE - mild LVH - EF 45% - akinesis of the apical septal wall, the apicalanterior wall, and the true apex 10/08/17 -  Ultrasound-guided paracentesis by IR yielding 1 L of fluid.  Neutrophil count close to 200, SAAG score cannot be calculated as there was no serum albumin from that day. 10/08/17 CT potential mass lesion in the central mesentery positioned between the SMV and second portion duodenum. Consider endoscopic ultrasound (EUS) evaluation and tumor sampling. FDG PET scan may be beneficial on outpatient basis.  10/11/2017 Repeat paracentesis   Subjective: Developed worsening SOB last night, though not in extremis.  Feels "about the same" this morning, with some ongoing tachypnea.  Denies cp, n/v, sob.  Confirms that his abdom is much more distended than his baseline.       Assessment & Plan:  STEMI caused by LAD lesion requiring DES  placement this admission - on dual antiplatelet therapy - asymptomatic at this time   Abdom distention - abdom compartment syndrome - Acute hypoxic resp failure Assumed to be ascites, but f/u US for paracentesis 9/9 revealed no signif volume of ascites - abdom is quiet, but not tense - check non-contrasted CT abdom to better evaluate   Hemochromatosis induced cirrhosis w/ ascites had a therapeutic and diagnostic paracentesis 9/6 with 1 L of fluid removed, neutrophil count around 200 - was to have repeat paracentesis 9/9 but no fluid was found on [place] - his GI MD is following w/ [place]   Hypotension, tachycardia severely intravascularly dehydrated, but complicated by marked ascites - care w/ diuretic/fluids - volume balance will be quite difficult - presently remains intravascularly dry to exam - attempt to avoid hydration unless renal fxn begins to suffer   Hyponatremia and hypochloremia Due to above - follow in serial fashion - w/ climbing K+ had to stop aldactone   Moderate to severe protein calorie malnutrition Cont Protostat  Dyslipidemia On statin  Left upper pole kidney cyst Outpatient PCP follow-up  Potential abdominal mass For outpt f/u   DM2 CBG climbing adjust tx and follow trend   MRSA screen +  DVT prophylaxis: [place] heparin  Code Status: FULL CODE Family Communication: no family present at time of exam  Disposition Plan: SDU  Consultants:  GI PCCM Cardiology   Antimicrobials:  Rocephin 9/6 > 9/8  Objective: Blood pressure 97/74, pulse (!) 120, temperature 98 F (36.7 C), temperature source Oral, resp. rate (!) 22, height 6\' 4"  (1.93 m), weight 122.9 kg,  SpO2 96 %.  Intake/Output Summary (Last 24 hours) at 10/12/2017 1101 Last data filed at 10/12/2017 0900 Gross per 24 hour  Intake 1368.98 ml  Output 400 ml  Net 968.98 ml   Filed Weights   10/10/17 0354 10/11/17 0355 10/12/17 0256  Weight: 117.8 kg 120.7 kg 122.9 kg    Examination: General:  tachypneic - not in distress but remains SOB  Lungs: CTA B -  no wheezes or crackles Cardiovascular: tachycardic - regular  Abdomen: remains markedly distended, no rebound, taught, BS hypoactive, no mass Extremities: No signif edema B LE   CBC: Recent Labs  Lab 10/15/2017 1146  10/06/17 0331  10/10/17 0413 10/11/17 0344 10/12/17 0357  WBC 10.2  --  9.9   < > 12.4* 13.9* 13.2*  NEUTROABS 6.2  --  6.6  --   --   --   --   HGB 13.9   < > 13.7   < > 13.5 13.7 13.5  HCT 42.9   < > 41.0   < > 41.6 42.2 41.8  MCV 89.4  --  87.8   < > 88.3 88.7 89.7  PLT 254  --  243   < > 281 324 278   < > = values in this interval not displayed.   Basic Metabolic Panel: Recent Labs  Lab 10/10/17 0413 10/11/17 0344 10/12/17 0357  NA 130* 130* 129*  K 4.2 5.5* 4.5  CL 95* 96* 96*  CO2 25 24 21*  GLUCOSE 198* 138* 200*  BUN 25* 27* 31*  CREATININE 1.14 1.05 1.12  CALCIUM 8.2* 8.2* 8.6*  MG  --  2.1 2.0  PHOS  --   --  4.2   GFR: Estimated Creatinine Clearance: 91.6 mL/min (by C-G formula based on SCr of 1.12 mg/dL).  Liver Function Tests: Recent Labs  Lab 10/23/2017 1146 10/10/17 0413 10/11/17 0344 10/12/17 0357  AST 31 50* 105* 72*  ALT 25 44 63* 80*  ALKPHOS 47 48 55 57  BILITOT 1.8* 1.0 1.9* 0.8  PROT 6.0* 5.7* 5.4* 6.0*  ALBUMIN 3.0* 2.7* 2.6* 3.3*    Coagulation Profile: Recent Labs  Lab 10/15/2017 1146  INR 1.17    Cardiac Enzymes: Recent Labs  Lab 10/22/2017 1146 10/06/17 0331 10/08/17 1436  TROPONINI 0.03* 26.74* 4.50*    HbA1C: Hgb A1c MFr Bld  Date/Time Value Ref Range Status  10/06/2017 03:31 AM 6.9 (H) 4.8 - 5.6 % Final    Comment:    (NOTE) Pre diabetes:          5.7%-6.4% Diabetes:              >6.4% Glycemic control for   <7.0% adults with diabetes   05/22/2014 03:10 PM 6.5 (H) 4.8 - 5.6 % Final    Comment:    (NOTE)         Pre-diabetes: 5.7 - 6.4         Diabetes: >6.4         Glycemic control for adults with diabetes: <7.0     CBG: Recent  Labs  Lab 10/11/17 0550 10/11/17 1205 10/11/17 1640 10/11/17 2047 10/12/17 0606  GLUCAP 139* 279* 221* 185* 195*    Recent Results (from the past 240 hour(s))  MRSA PCR Screening     Status: Abnormal   Collection Time: 10/28/2017  3:45 PM  Result Value Ref Range Status   MRSA by PCR POSITIVE (A) NEGATIVE Final    Comment:  The GeneXpert MRSA Assay (FDA approved for NASAL specimens only), is one component of a comprehensive MRSA colonization surveillance program. It is not intended to diagnose MRSA infection nor to guide or monitor treatment for MRSA infections. RESULT CALLED TO, READ BACK BY AND VERIFIED WITH: Angelica Chessman RN 17:20 10/24/2017 ([email or web address]) Performed at [facility], [place] [address] [place], [place] [address]   Stat Gram stain     Status: None   Collection Time: 10/08/17  7:31 PM  Result Value Ref Range Status   Specimen Description PERITONEAL  Final   Special Requests NONE  Final   Gram Stain   Final    FEW WBC PRESENT,BOTH PMN AND MONONUCLEAR NO ORGANISMS SEEN Performed at [facility], [address] [place], [place] [address]    Report Status 10/08/2017 FINAL  Final  Culture, body fluid-bottle     Status: None (Preliminary result)   Collection Time: 10/08/17  7:31 PM  Result Value Ref Range Status   Specimen Description PERITONEAL  Final   Special Requests NONE  Final   Culture   Final    NO GROWTH 4 DAYS Performed at [facility] [address] [place], [place] [address]    Report Status PENDING  Incomplete  Culture, Urine     Status: None   Collection Time: 10/09/17  7:47 PM  Result Value Ref Range Status   Specimen Description URINE, RANDOM  Final   Special Requests NONE  Final   Culture   Final    NO GROWTH Performed at [facility], [place] [address], [place], [place] [address]    Report Status 10/10/2017 FINAL  Final     Scheduled Meds: . aspirin EC  81 mg Oral Daily  . atorvastatin  40 mg Oral q1800  .  bisacodyl  10 mg Oral QHS  . buPROPion  300 mg Oral Daily  . docusate sodium  100 mg Oral Daily  . feeding supplement (GLUCERNA SHAKE)  237 mL Oral TID BM  . feeding supplement (PRO-STAT SUGAR FREE 64)  30 mL Oral Daily  . heparin injection (subcutaneous)  5,000 Units Subcutaneous Q8H  . insulin aspart  0-15 Units Subcutaneous TID WC  . insulin aspart  4 Units Subcutaneous TID WC  . insulin detemir  10 Units Subcutaneous QHS  . ipratropium-albuterol  3 mL Nebulization Q6H  . metoprolol tartrate  12.5 mg Oral BID  . naloxegol oxalate  12.5 mg Oral Daily  . polyethylene glycol  17 g Oral BID  . prasugrel  10 mg Oral Daily  . psyllium  1 packet Oral BID  . zolpidem  5 mg Oral QHS   Continuous Infusions: . sodium chloride 10 mL/hr at 10/12/17 0309     LOS: 7 days   Cherene Altes, MD [facility] Office  [phone] Pager - Text Page per Shea Evans  If 7PM-7AM, please contact night-coverage per Amion 10/12/2017, 11:01 AM

## 2017-10-12 NOTE — Significant Event (Addendum)
Rapid Response Event Note  Overview: Time Called: 2300 Arrival Time: 2315 Event Type: Respiratory - Secondary to GI Issue  Initial Focused Assessment: Received a call from the RN about patient having some shortness of breath and being uncomfortable. Per RN, patient's abdomen is very distended, + bowel sounds, + abdominal mass, paracentesis x 2 this admission.  Per RN, not hypoxic. Upon reviewing the chart, his symptoms are not new. RN paged [facility] NP as well, received orders for Duoneb. I did go and see patient after his treatment, patient was resting, was not in distress, mild labored breathing, patient stated he felt like he was breathing better now. Not in acute distress and VSS.  Interventions: - NONE  Plan of Care: - Monitor patient, should patient worsen call RRT and page [facility] NP. - If patient does become distressed, might need intubation, because patient does not seem like an appropriate candidate of BIPAP, d/t abdominal distension.  Event Summary:  Name of Physician Notified: Luke NP at [facility]  Outcome: Stayed in room and stabalized  Event End Time: [place], [place]

## 2017-10-12 NOTE — Care Management Note (Signed)
Case Management Note Marvetta Gibbons RN, BSN Unit 4E- RN Care Coordinator  [phone]  Patient Details  Name: Daniel Rivers MRN: [id number] Date of Birth: 01/21/1951  Subjective/Objective:   Pt admitted with acute MI, cath on 9/9- plan to treat medically.  Continues to have abd. Distension s/p recurrent paracenteses. ?abd mass- plan for work up as outpt               Action/Plan: PTA pt lived at home, benefits check completed for [employer]- generic drug covered at $5.30- brand name would need auth. CM to follow for transition of care needs.   Expected Discharge Date:                  Expected Discharge Plan:  Home/Self Care  In-House Referral:     Discharge planning Services  CM Consult  Post Acute Care Choice:    Choice offered to:     DME Arranged:    DME Agency:     HH Arranged:    HH Agency:     Status of Service:  In process, will continue to follow  If discussed at Long Length of Stay Meetings, dates discussed:    Discharge Disposition: home/self care   Additional Comments:  Dawayne Patricia, RN 10/12/2017, 2:49 PM

## 2017-10-12 NOTE — Progress Notes (Signed)
Subjective: Rough night. Breathing is better, but now on oxygen.  Objective: Vital signs in last 24 hours: Temp:  [97.7 F (36.5 C)-98.4 F (36.9 C)] 98 F (36.7 C) (09/10 0539) Pulse Rate:  [107-123] 108 (09/10 0539) Resp:  [18-25] 18 (09/10 0539) BP: (106-134)/(73-102) 116/86 (09/10 0539) SpO2:  [94 %-100 %] 95 % (09/10 0539) Weight:  [122.9 kg] 122.9 kg (09/10 0256) Last BM Date: 10/10/17  Intake/Output from previous day: 09/09 0701 - 09/10 0700 In: 1189 [P.O.:1080; I.V.:109] Out: 400 [Urine:400] Intake/Output this shift: No intake/output data recorded.  General appearance: fatigued appearing Resp: clear to auscultation bilaterally Cardio: regular rate and rhythm GI: distended, some tympany Extremities: extremities normal, atraumatic, no cyanosis or edema  Lab Results: Recent Labs    10/10/17 0413 10/11/17 0344 10/12/17 0357  WBC 12.4* 13.9* 13.2*  HGB 13.5 13.7 13.5  HCT 41.6 42.2 41.8  PLT 281 324 278   BMET Recent Labs    10/10/17 0413 10/11/17 0344 10/12/17 0357  NA 130* 130* 129*  K 4.2 5.5* 4.5  CL 95* 96* 96*  CO2 25 24 21*  GLUCOSE 198* 138* 200*  BUN 25* 27* 31*  CREATININE 1.14 1.05 1.12  CALCIUM 8.2* 8.2* 8.6*   LFT Recent Labs    10/12/17 0357  PROT 6.0*  ALBUMIN 3.3*  AST 72*  ALT 80*  ALKPHOS 57  BILITOT 0.8   PT/INR No results for input(s): LABPROT, INR in the last 72 hours. Hepatitis Panel No results for input(s): HEPBSAG, HCVAB, HEPAIGM, HEPBIGM in the last 72 hours. C-Diff No results for input(s): CDIFFTOX in the last 72 hours. Fecal Lactopherrin No results for input(s): FECLLACTOFRN in the last 72 hours.  Studies/Results: Ir Abdomen US Limited  Result Date: 10/11/2017 CLINICAL DATA:  Cirrhosis. Mesenteric mass. Ascites. Paracentesis requested EXAM: LIMITED ABDOMEN ULTRASOUND FOR ASCITES TECHNIQUE: Limited ultrasound survey for ascites was performed in all four abdominal quadrants. COMPARISON:  CT 10/28/2017 FINDINGS:  Small amount of abdominal ascites. No pocket large enough for safe paracentesis due to regional bowel. IMPRESSION: Low volume abdominal ascites.  Paracentesis deferred. Electronically Signed   By: Lucrezia Europe M.D.   On: 10/11/2017 16:16    Medications:  Scheduled: . aspirin EC  81 mg Oral Daily  . atorvastatin  40 mg Oral q1800  . bisacodyl  10 mg Oral QHS  . buPROPion  300 mg Oral Daily  . docusate sodium  100 mg Oral Daily  . feeding supplement (GLUCERNA SHAKE)  237 mL Oral TID BM  . feeding supplement (PRO-STAT SUGAR FREE 64)  30 mL Oral Daily  . heparin injection (subcutaneous)  5,000 Units Subcutaneous Q8H  . insulin aspart  0-15 Units Subcutaneous TID WC  . insulin aspart  4 Units Subcutaneous TID WC  . insulin detemir  10 Units Subcutaneous QHS  . ipratropium-albuterol  3 mL Nebulization Q6H  . metoprolol tartrate  12.5 mg Oral BID  . naloxegol oxalate  12.5 mg Oral Daily  . polyethylene glycol  17 g Oral BID  . prasugrel  10 mg Oral Daily  . psyllium  1 packet Oral BID  . zolpidem  5 mg Oral QHS   Continuous: . sodium chloride 10 mL/hr at 10/12/17 0309    Assessment/Plan: 1) Abdominal distension of ? Etiology.  Presumed to be secondary to ascites, but this was essentially removed with the paracentesis. 2) SOB secondary to abdominal distension. 3) Mesenteric mass.    The patient has hypoventilation, which is causing his hypoxia.  No evidence of wheezing this morning.  All the current imaging does not show an ileus and there is no ascites to tap for a paracentesis.  Rapid response evaluated the patient and he was placed on oxygen.  He does not appear to have soft tissue edema with the physical examination.  Plan: 1) He may still benefit with diuresis when it is safe from the cardiology standpoint. 2) Continue with oxygen. 3) I will review the CT scan results with radiology.    LOS: 7 days   Huberta Tompkins D 10/12/2017, 7:29 AM

## 2017-10-12 NOTE — Progress Notes (Signed)
Subjective:  Continues to have abdominal distension s/p recurrent paracenteses. Intermittent episodes of shortness of breath. although no hypoxia.   Objective:  Vital Signs in the last 24 hours: Temp:  [98 F (36.7 C)-98.4 F (36.9 C)] 98 F (36.7 C) (09/10 0539) Pulse Rate:  [107-123] 120 (09/10 0900) Resp:  [18-22] 22 (09/10 0900) BP: (97-116)/(74-93) 97/74 (09/10 0900) SpO2:  [94 %-98 %] 96 % (09/10 0900) Weight:  [122.9 kg] 122.9 kg (09/10 0256)  Intake/Output from previous day: 09/09 0701 - 09/10 0700 In: 1189 [P.O.:1080; I.V.:109] Out: 400 [Urine:400] Intake/Output from this shift: Total I/O In: 420 [P.O.:420] Out: -   Physical Exam: Constitutional: He is oriented to person, place, and time. He appears well-developed and well-nourished. He appears in no distress  HENT:  Head: Atraumatic.  Eyes: Conjunctivae are normal.  Neck: Neck supple. No JVD present.  Cardiovascular: Normal rate, regular rhythm and normal heart sounds. Tachycardic No murmur heard. Pulses: Intact distal pulses, except absent PT b/l Respiratory: Remains mildly tachypnic. Right basal rales. Abdominal: Soft. Bowel sounds are normal.  Abdominal distension. No tenderness. Musculoskeletal: He exhibits no edema or deformity.  Lymphadenopathy:    He has no cervical adenopathy.  Neurological: He is alert and oriented to person, place, and time.  Skin: Skin is warm and dry.  Psychiatric: He has a normal mood and affect.   Lab Results: Recent Labs    10/11/17 0344 10/12/17 0357  WBC 13.9* 13.2*  HGB 13.7 13.5  PLT 324 278   Recent Labs    10/11/17 0344 10/12/17 0357  NA 130* 129*  K 5.5* 4.5  CL 96* 96*  CO2 24 21*  GLUCOSE 138* 200*  BUN 27* 31*  CREATININE 1.05 1.12   No results for input(s): TROPONINI in the last 72 hours.  Invalid input(s): CK, MB CT Abdomen w/contrast 10/27/2017: 1. Potential mass lesion in the central mesentery positioned between the SMV and second portion  duodenum. Mass suggests lymphoma, gastrointestinal stromal tumor, primary duodenal carcinoma, carcinoid tumor, atypical pancreatic carcinoma or metastatic nodal conglomerate from unknown primary. Lower abdomen and pelvis not included on exam. Consider endoscopic ultrasound (EUS) evaluation and tumor sampling. FDG PET scan may be beneficial on outpatient basis. 2. Nodule liver with ascites suggests cirrhosis. 3. Normal bilateral pleural effusions and associated atelectasis.  Cardiac Studies: EKG 10/06/2017: Sinus tachycardia 100 bpm. Age indeterminate anrteroseptal and inferior infarct. ST elevation improved compared to previous EKG.   Echocardiogram 10/17/2017: Study Conclusions  - Left ventricle: The cavity size was normal. There was mild   concentric hypertrophy. Systolic function was moderately reduced.   The estimated ejection fraction was in the range of 35% to 40%.   Severe hypokinesis of the anteroseptal and apical myocardium.   Features are consistent with a pseudonormal left ventricular   filling pattern, with concomitant abnormal relaxation and   increased filling pressure (grade 2 diastolic dysfunction). - Aortic valve: Mildly calcified annulus. - No hemodynamically significant valvular abnormality.   No evidence of pulmonary hypertension.   LM: Normal LAD: Prox LAD severely calcified 80% stenosis (Non-culprit)         Mid LAD 100% ISR (Culptit stenosis) LCx: Mild prox disease RCA: Patent prox-mid stent. Focal 50% restenosis  Successful PTCA and stent placement, plus aspiration thrombectomy  RA: 12 mmHg PA: 34/22 mmHg Mean PA 27 mmHg PW: 17 mmHg  LV 84/9 mmHg, LVEDP 16 mmHg  CO: 6.1 L/min CI 2.5 L/min/m2  No cardiogenic shock  Cath 10/18/2017: LM: Normal LAD:  Prox LAD calcified 80% stenosis. Stable, unchanged compared to cath on 09/03          Patent mid LAD stents, no significant restenosis LCx: Mild prox disease RCA: Mild prox ISR  No changes  compared to post PCI angiographic images on 09/03 LVEDP 8 mmHg EKG changes possible related to post MI LV aneurysmal changes.  Recommendation: Continue aspirin 81 mg, Effient 10 mg daily. Continue workup for patient's abdominal distension.  Assessment/Plan:  67 year old Caucasian male with morbid obesity, hypertension, type 2 diabetes mellitus, on chronic narcotic medications for back pain, CAD status post prior LAD and RCA PCI, admitted with anterior STEMI 09/03 s/p PCI, hemochromatosis with liver cirrhosis followed by Dr. Benson Norway, now with ascites, new diagnosis of abdominal mass-possibly malignant, pesistent sinus tachycardia.  STEMI:  Culprit vessel mid LAD s/p aspiration thrombectomy and PCI Synergy DES 3.0 X 12 mm. Residual prox LAD severely calcified 80% stenosis that would ideally need staged atherectomy. However, the plan would have to be on hold pending more infromation on diagnosis and prognosis of abdominal mass.  Repeat cath 09/05 due to STE V1-V3. Patent mid LAD Stents with no stent thrombosis. STE possibly due to LV aneurysmal changes. Conitnue aspirin/Effient  HFrEF: Post MI. EF 35-40% EF 35-40%. Continue losartan to 25 mg. He is not in cardiogenic shock, as LVEDP is low at 8 mmHg. Suspect tachycardia is at least partially due to abdominal pathology.  Tolerating low dose metoprolol today.  Not on ACEi/ARB in lieu of using spironolactone for his ascites.  Could use lasix iv 40 mg once daily on 09/10 and 09/11.   Sinus tachycardia: Not due to cardiogenic shock. No PE on CTA. Possible due to sepsis or underlying abdominal pathology/possible abdominal compartment syndrome? Continue metoprolol 12.5 mg bid.  Abdominal mass/ascites: He is at high risk of stent thrombosis few days post primary PCI for STEMI making interruption of antiplatelet therapy difficult. Will need to weigh risks/benefits and timing for the biopsy. Possibly 1-3 months from now.     LOS: 7 days     Mackena Plummer J Kit Brubacher 10/12/2017, 11:41 AM  [place], MD [facility] [place] Pager: [phone] Office: [phone] If no answer Cell [phone]

## 2017-10-13 ENCOUNTER — Inpatient Hospital Stay (HOSPITAL_COMMUNITY): Payer: Medicare Other

## 2017-10-13 ENCOUNTER — Encounter (HOSPITAL_COMMUNITY): Payer: Self-pay | Admitting: Radiology

## 2017-10-13 ENCOUNTER — Other Ambulatory Visit: Payer: Self-pay

## 2017-10-13 HISTORY — PX: IR THORACENTESIS ASP PLEURAL SPACE W/IMG GUIDE: IMG5380

## 2017-10-13 LAB — COMPREHENSIVE METABOLIC PANEL
ALK PHOS: 61 U/L (ref 38–126)
ALT: 72 U/L — ABNORMAL HIGH (ref 0–44)
ANION GAP: 13 (ref 5–15)
AST: 80 U/L — ABNORMAL HIGH (ref 15–41)
Albumin: 3.2 g/dL — ABNORMAL LOW (ref 3.5–5.0)
BUN: 37 mg/dL — ABNORMAL HIGH (ref 8–23)
CALCIUM: 9.1 mg/dL (ref 8.9–10.3)
CO2: 21 mmol/L — ABNORMAL LOW (ref 22–32)
CREATININE: 1.13 mg/dL (ref 0.61–1.24)
Chloride: 93 mmol/L — ABNORMAL LOW (ref 98–111)
GFR calc non Af Amer: >60 mL/min (ref >60)
GLUCOSE: 206 mg/dL — AB (ref 70–99)
Potassium: 4.6 mmol/L (ref 3.5–5.1)
Sodium: 127 mmol/L — ABNORMAL LOW (ref 135–145)
Total Bilirubin: 1.5 mg/dL — ABNORMAL HIGH (ref 0.3–1.2)
Total Protein: 6 g/dL — ABNORMAL LOW (ref 6.5–8.1)

## 2017-10-13 LAB — GLUCOSE, PLEURAL OR PERITONEAL FLUID: Glucose, Fluid: 207 mg/dL

## 2017-10-13 LAB — CBC
HCT: 40.5 % (ref 39.0–52.0)
HEMOGLOBIN: 13.4 g/dL (ref 13.0–17.0)
MCH: 28.8 pg (ref 26.0–34.0)
MCHC: 33.1 g/dL (ref 30.0–36.0)
MCV: 87.1 fL (ref 78.0–100.0)
PLATELETS: 364 10*3/uL (ref 150–400)
RBC: 4.65 MIL/uL (ref 4.22–5.81)
RDW: 14.2 % (ref 11.5–15.5)
WBC: 13.8 10*3/uL — ABNORMAL HIGH (ref 4.0–10.5)

## 2017-10-13 LAB — CULTURE, BODY FLUID-BOTTLE

## 2017-10-13 LAB — BODY FLUID CELL COUNT WITH DIFFERENTIAL
Eos, Fluid: 0 %
Lymphs, Fluid: 39 %
Monocyte-Macrophage-Serous Fluid: 56 % (ref 50–90)
NEUTROPHIL FLUID: 5 % (ref 0–25)
WBC FLUID: 470 uL (ref 0–1000)

## 2017-10-13 LAB — GLUCOSE, CAPILLARY
Glucose-Capillary: 220 mg/dL — ABNORMAL HIGH (ref 70–99)
Glucose-Capillary: 193 mg/dL — ABNORMAL HIGH (ref 70–99)
Glucose-Capillary: 169 mg/dL — ABNORMAL HIGH (ref 70–99)
Glucose-Capillary: 153 mg/dL — ABNORMAL HIGH (ref 70–99)

## 2017-10-13 LAB — OSMOLALITY, URINE: Osmolality, Ur: 702 mOsm/kg (ref 300–900)

## 2017-10-13 LAB — GRAM STAIN

## 2017-10-13 LAB — LACTATE DEHYDROGENASE, PLEURAL OR PERITONEAL FLUID: LD, Fluid: 205 U/L — ABNORMAL HIGH (ref 3–23)

## 2017-10-13 LAB — SODIUM, URINE, RANDOM: Sodium, Ur: 10 mmol/L

## 2017-10-13 LAB — CULTURE, BODY FLUID W GRAM STAIN -BOTTLE: Culture: NO GROWTH

## 2017-10-13 LAB — MAGNESIUM: MAGNESIUM: 2.1 mg/dL (ref 1.7–2.4)

## 2017-10-13 LAB — PHOSPHORUS: Phosphorus: 4.6 mg/dL (ref 2.5–4.6)

## 2017-10-13 LAB — PROTEIN, PLEURAL OR PERITONEAL FLUID: TOTAL PROTEIN, FLUID: 3.4 g/dL

## 2017-10-13 MED ORDER — SODIUM CHLORIDE 0.9 % IV SOLN
INTRAVENOUS | Status: DC
  Administered 2017-10-13: 11:00:00 via INTRAVENOUS

## 2017-10-13 MED ORDER — LIDOCAINE HCL (PF) 2 % IJ SOLN
INTRAMUSCULAR | Status: AC
  Filled 2017-10-13: qty 20

## 2017-10-13 MED ORDER — LIDOCAINE HCL (PF) 2 % IJ SOLN
INTRAMUSCULAR | Status: DC | PRN
  Administered 2017-10-13: 10 mL

## 2017-10-13 NOTE — Progress Notes (Signed)
Subjective:  Continues to have abdominal distension  Pleural effusion Continued tachycardia, shortness of breath  Objective:  Vital Signs in the last 24 hours: Temp:  [97.9 F (36.6 C)-98.7 F (37.1 C)] 98.6 F (37 C) (09/11 1552) Pulse Rate:  [82-128] 118 (09/11 1552) Resp:  [19-34] 23 (09/11 1552) BP: (109-121)/(73-110) 115/86 (09/11 1552) SpO2:  [92 %-99 %] 98 % (09/11 1552) Weight:  [124.5 kg] 124.5 kg (09/11 0102)  Intake/Output from previous day: 09/10 0701 - 09/11 0700 In: 1002.6 [P.O.:780; I.V.:222.6] Out: -  Intake/Output from this shift: Total I/O In: 221.8 [I.V.:221.8] Out: -   Physical Exam: Constitutional: He is oriented to person, place, and time. He appears well-developed and well-nourished. He appears in no distress  HENT:  Head: Atraumatic.  Eyes: Conjunctivae are normal.  Neck: Neck supple. No JVD present.  Cardiovascular: Normal rate, regular rhythm and normal heart sounds. Tachycardic No murmur heard. Pulses: Intact distal pulses, except absent PT b/l Respiratory: Remains mildly tachypnic. Abdominal: Soft. Bowel sounds are normal.  Abdominal distension. No tenderness. Musculoskeletal: He exhibits no edema or deformity.  Lymphadenopathy:    He has no cervical adenopathy.  Neurological: He is alert and oriented to person, place, and time.  Skin: Skin is warm and dry.  Psychiatric: He has a normal mood and affect.   Lab Results: Recent Labs    10/12/17 0357 10/13/17 0315  WBC 13.2* 13.8*  HGB 13.5 13.4  PLT 278 364   Recent Labs    10/12/17 0357 10/13/17 0315  NA 129* 127*  K 4.5 4.6  CL 96* 93*  CO2 21* 21*  GLUCOSE 200* 206*  BUN 31* 37*  CREATININE 1.12 1.13   No results for input(s): TROPONINI in the last 72 hours.  Invalid input(s): CK, MB CT Abdomen w/contrast 10/28/2017: 1. Potential mass lesion in the central mesentery positioned between the SMV and second portion duodenum. Mass suggests lymphoma, gastrointestinal  stromal tumor, primary duodenal carcinoma, carcinoid tumor, atypical pancreatic carcinoma or metastatic nodal conglomerate from unknown primary. Lower abdomen and pelvis not included on exam. Consider endoscopic ultrasound (EUS) evaluation and tumor sampling. FDG PET scan may be beneficial on outpatient basis. 2. Nodule liver with ascites suggests cirrhosis. 3. Normal bilateral pleural effusions and associated atelectasis.  Cardiac Studies: EKG 10/06/2017: Sinus tachycardia 100 bpm. Age indeterminate anrteroseptal and inferior infarct. ST elevation improved compared to previous EKG.   Echocardiogram 10/23/2017: Study Conclusions  - Left ventricle: The cavity size was normal. There was mild   concentric hypertrophy. Systolic function was moderately reduced.   The estimated ejection fraction was in the range of 35% to 40%.   Severe hypokinesis of the anteroseptal and apical myocardium.   Features are consistent with a pseudonormal left ventricular   filling pattern, with concomitant abnormal relaxation and   increased filling pressure (grade 2 diastolic dysfunction). - Aortic valve: Mildly calcified annulus. - No hemodynamically significant valvular abnormality.   No evidence of pulmonary hypertension.   LM: Normal LAD: Prox LAD severely calcified 80% stenosis (Non-culprit)         Mid LAD 100% ISR (Culptit stenosis) LCx: Mild prox disease RCA: Patent prox-mid stent. Focal 50% restenosis  Successful PTCA and stent placement, plus aspiration thrombectomy  RA: 12 mmHg PA: 34/22 mmHg Mean PA 27 mmHg PW: 17 mmHg  LV 84/9 mmHg, LVEDP 16 mmHg  CO: 6.1 L/min CI 2.5 L/min/m2  No cardiogenic shock  Cath 10/12/2017: LM: Normal LAD: Prox LAD calcified 80% stenosis. Stable, unchanged compared  to cath on 09/03          Patent mid LAD stents, no significant restenosis LCx: Mild prox disease RCA: Mild prox ISR  No changes compared to post PCI angiographic images on  09/03 LVEDP 8 mmHg EKG changes possible related to post MI LV aneurysmal changes.  Recommendation: Continue aspirin 81 mg, Effient 10 mg daily. Continue workup for patient's abdominal distension.  Assessment/Plan:  67 year old Caucasian male with morbid obesity, hypertension, type 2 diabetes mellitus, on chronic narcotic medications for back pain, CAD status post prior LAD and RCA PCI, admitted with anterior STEMI 09/03 s/p PCI, hemochromatosis with liver cirrhosis followed by Dr. Benson Norway, now with ascites, new diagnosis of abdominal mass-possibly malignant, pesistent sinus tachycardia.  STEMI:  Culprit vessel mid LAD s/p aspiration thrombectomy and PCI Synergy DES 3.0 X 12 mm. Residual prox LAD severely calcified 80% stenosis that would ideally need staged atherectomy. However, the plan would have to be on hold pending more infromation on diagnosis and prognosis of abdominal mass.  Repeat cath 09/05 due to STE V1-V3. Patent mid LAD Stents with no stent thrombosis. STE possibly due to LV aneurysmal changes. Conitnue aspirin/Effient  HFrEF: Post MI. EF 35-40% EF 35-40%. Continue losartan to 25 mg. He is not in cardiogenic shock, as LVEDP is low at 8 mmHg. Suspect tachycardia is at least partially due to abdominal pathology.  Tolerating low dose metoprolol today.  Not on ACEi/ARB in lieu of using spironolactone for his ascites. Management difficult due to persistent hypotension, tachycardia, and ascites/pleural effusion.  Sinus tachycardia: Not due to cardiogenic shock. No PE on CTA.  No evidence of sepsis at this time. I suspect this is either secondary to hypovolumia, or inappropriate sinus tachycardia.  Continue metoprolol 12.5 mg bid.  Abdominal mass/ascites: He is at high risk of stent thrombosis few days post primary PCI for STEMI making interruption of antiplatelet therapy difficult. Will need to weigh risks/benefits and timing for the biopsy. Possibly 1-3 months from now.       LOS: 8 days    Daniel Rivers 10/13/2017, 4:54 PM  [place], MD [facility]. [place] Pager: [phone] Office: [phone] If no answer Cell [phone]

## 2017-10-13 NOTE — Progress Notes (Signed)
[place] TEAM 1 - Stepdown/ICU TEAM  Daniel Rivers  VVO:[id number] DOB: September 07, 1950 DOA: 10/16/2017 PCP: Daniel Gravel, MD    Brief Narrative:  67 year old M w/ a hx of ankylosing spondylitis, anxiety, BPH, chronic back pain, CAD, GERD, dyslipidemia, obesity, nonalcoholic cirrhosis, hemochromatosis, and DM2 who was admitted by Cardiology on 10/04/2017 for a STEMI requiring a stent placement. Subsequently he developed abdominal pain and distention and was found to have ascites, GI was consulted. The patient was transferred to the [facility] service on 10/09/2017 for management of his medical issues.  The patient has recurrent ascites, he is hypotensive and tachycardic.  Significant Events: 10/30/2017 - Left heart catheterization with LAD DES placement  10/26/2017 - TTE -  EF 35% to 40%.Severe hypokinesis of the anteroseptal and apical myocardium - grade 2 diastolic dysfunction 03/10/92 [place] - Cirrhosis of the liver - small amount of abdominal ascites - 1.4 cm hypoechoic lesion upper pole left kidney, complex cystic versus small solid nodule 10/13/2017  CTchest  No pulmonary emboli 10/08/17 TTE - mild LVH - EF 45% - akinesis of the apical septal wall, the apicalanterior wall, and the true apex 10/08/17 -  Ultrasound-guided paracentesis by IR yielding 1 L of fluid.  Neutrophil count close to 200, SAAG score cannot be calculated as there was no serum albumin from that day. 10/08/17 CT potential mass lesion in the central mesentery positioned between the SMV and second portion duodenum. Consider endoscopic ultrasound (EUS) evaluation and tumor sampling. FDG PET scan may be beneficial on outpatient basis.  10/11/2017 Repeat paracentesis.    Subjective: Patient in bed, still having abdominal distention, no abdominal pain still feels short of breath, denies any chest pain no focal weakness.  Assessment & Plan:   1.  Initial hospital admission for STEMI caused by LAD lesion requiring DES placement this admission.    Cardiology on  board, he is currently chest pain-free and this problem seems to be stable, continue dual antiplatelet therapy along with beta-blocker and statin for secondary prevention.  Defer management of this problem to cardiology.  2.  History of hemochromatosis, hemochromatosis induced cirrhosis.  With ascites earlier this admission, he had a therapeutic and diagnostic paracentesis few days ago with 1 L of fluid removed, received empiric Rocephin but GI has ruled out SBP and stop antibiotics.  Ultrasound did not show any significant remaining ascites this was done on 10/11/2017, case discussed with patient's GI physician Dr. Almyra Free on 10/13/2017, for now continue to monitor.  3.  Shortness of breath.  Caused by abdominal distention along with moderate right-sided pleural effusion, see #2 as above, will order ultrasound-guided thoracentesis on the right side to be done by IR on 10/13/2017, diagnostic tests ordered, continue supportive care with oxygen nebulizer treatment, encouraged to sit up in chair and use flutter valve along with I-S.  4.  Hypotension, tachycardia, dry oral mucosa - severely intravascularly dehydrated, he is hyponatremic and hypochloremic, hold diuretics and hydrate with IV fluids.  5.  Hyponatremia and hypochloremia.    See #4 above.  6.  Moderate to severe protein calorie malnutrition.  On Protostat.  7.  Mildly elevated lactate.  This is due to intravascular dehydration.  Hydrate and monitor.  He does not appear septic.  8.  Dyslipidemia.  On statin.  9.  Left upper pole kidney cyst.  Outpatient PCP follow-up.    10.  Potential abdominal mass.  Defer that to GI.  Dr. Benson Norway on board.  11.  Severe abdominal distention.  Inconclusive ultrasound with minimal to no ascites on 10/11/2017, abdominal x-ray is inconclusive, check CT abdomen pelvis noncontrast on 10/13/2017.    12. DM. type II -gently on Levemir along with sliding scale.  Monitor and adjust.  CBG (last 3)  Recent Labs     10/12/17 1644 10/12/17 2035 10/13/17 0558  GLUCAP 236* 233* 220*      DVT prophylaxis: [place] heparin  Code Status: FULL CODE Family Communication: no family present at time of exam  Disposition Plan: SDU    Consultants:  GI PCCM Cardiology   Antimicrobials:  Rocephin 9/6 > 9/8  Objective: Blood pressure 109/75, pulse (!) 123, temperature 98.5 F (36.9 C), temperature source Oral, resp. rate (!) 23, height 6\' 4"  (1.93 m), weight 124.5 kg, SpO2 92 %.  Intake/Output Summary (Last 24 hours) at 10/13/2017 1304 Last data filed at 10/13/2017 0126 Gross per 24 hour  Intake 582.6 ml  Output -  Net 582.6 ml   Filed Weights   10/11/17 0355 10/12/17 0256 10/13/17 0102  Weight: 120.7 kg 122.9 kg 124.5 kg    Examination:  Awake Alert, Oriented X 3, No new F.N deficits, Normal affect [place].AT,PERRAL Supple Neck,No JVD, No cervical lymphadenopathy appriciated.  Symmetrical Chest wall movement, Good air movement bilaterally, CTAB RRR,No Gallops, Rubs or new Murmurs, No Parasternal Heave +ve B.Sounds, Abd massively distended and tympanic, No tenderness, No organomegaly appriciated, No rebound - guarding or rigidity. No Cyanosis, Clubbing or edema, No new Rash or bruise   CBC: Recent Labs  Lab 10/11/17 0344 10/12/17 0357 10/13/17 0315  WBC 13.9* 13.2* 13.8*  HGB 13.7 13.5 13.4  HCT 42.2 41.8 40.5  MCV 88.7 89.7 87.1  PLT 324 278 106   Basic Metabolic Panel: Recent Labs  Lab 10/11/17 0344 10/12/17 0357 10/13/17 0315  NA 130* 129* 127*  K 5.5* 4.5 4.6  CL 96* 96* 93*  CO2 24 21* 21*  GLUCOSE 138* 200* 206*  BUN 27* 31* 37*  CREATININE 1.05 1.12 1.13  CALCIUM 8.2* 8.6* 9.1  MG 2.1 2.0 2.1  PHOS  --  4.2 4.6   GFR: Estimated Creatinine Clearance: 91.4 mL/min (by C-G formula based on SCr of 1.13 mg/dL).  Liver Function Tests: Recent Labs  Lab 10/10/17 0413 10/11/17 0344 10/12/17 0357 10/13/17 0315  AST 50* 105* 72* 80*  ALT 44 63* 80* 72*  ALKPHOS 48 55 57  61  BILITOT 1.0 1.9* 0.8 1.5*  PROT 5.7* 5.4* 6.0* 6.0*  ALBUMIN 2.7* 2.6* 3.3* 3.2*    Coagulation Profile: No results for input(s): INR, PROTIME in the last 168 hours.  Cardiac Enzymes: Recent Labs  Lab 10/08/17 1436  TROPONINI 4.50*    HbA1C: Hgb A1c MFr Bld  Date/Time Value Ref Range Status  10/06/2017 03:31 AM 6.9 (H) 4.8 - 5.6 % Final    Comment:    (NOTE) Pre diabetes:          5.7%-6.4% Diabetes:              >6.4% Glycemic control for   <7.0% adults with diabetes   05/22/2014 03:10 PM 6.5 (H) 4.8 - 5.6 % Final    Comment:    (NOTE)         Pre-diabetes: 5.7 - 6.4         Diabetes: >6.4         Glycemic control for adults with diabetes: <7.0     CBG: Recent Labs  Lab 10/12/17 0606 10/12/17 1156 10/12/17 1644 10/12/17  2035 10/13/17 0558  GLUCAP 195* 228* 236* 233* 220*    Recent Results (from the past 240 hour(s))  MRSA PCR Screening     Status: Abnormal   Collection Time: 10/03/2017  3:45 PM  Result Value Ref Range Status   MRSA by PCR POSITIVE (A) NEGATIVE Final    Comment:        The GeneXpert MRSA Assay (FDA approved for NASAL specimens only), is one component of a comprehensive MRSA colonization surveillance program. It is not intended to diagnose MRSA infection nor to guide or monitor treatment for MRSA infections. RESULT CALLED TO, READ BACK BY AND VERIFIED WITH: Angelica Chessman RN 17:20 10/04/2017 ([email or web address]) Performed at [facility], [place] [address], [place], [place] [address]   Stat Gram stain     Status: None   Collection Time: 10/08/17  7:31 PM  Result Value Ref Range Status   Specimen Description PERITONEAL  Final   Special Requests NONE  Final   Gram Stain   Final    FEW WBC PRESENT,BOTH PMN AND MONONUCLEAR NO ORGANISMS SEEN Performed at [facility], [address] [place], [place] [address]    Report Status 10/08/2017 FINAL  Final  Culture, body fluid-bottle     Status: None   Collection Time: 10/08/17  7:31 PM    Result Value Ref Range Status   Specimen Description PERITONEAL  Final   Special Requests NONE  Final   Culture   Final    NO GROWTH 5 DAYS Performed at [facility] [address], [place], [place] [address]    Report Status 10/13/2017 FINAL  Final  Culture, Urine     Status: None   Collection Time: 10/09/17  7:47 PM  Result Value Ref Range Status   Specimen Description URINE, RANDOM  Final   Special Requests NONE  Final   Culture   Final    NO GROWTH Performed at [facility], [place] [address] [place], [place] [address]    Report Status 10/10/2017 FINAL  Final     Scheduled Meds: . aspirin EC  81 mg Oral Daily  . atorvastatin  40 mg Oral q1800  . buPROPion  300 mg Oral Daily  . feeding supplement (GLUCERNA SHAKE)  237 mL Oral TID BM  . feeding supplement (PRO-STAT SUGAR FREE 64)  30 mL Oral Daily  . heparin injection (subcutaneous)  5,000 Units Subcutaneous Q8H  . insulin aspart  0-15 Units Subcutaneous TID WC  . insulin aspart  4 Units Subcutaneous TID WC  . insulin detemir  14 Units Subcutaneous QHS  . ipratropium-albuterol  3 mL Nebulization TID  . lidocaine      . metoprolol tartrate  12.5 mg Oral BID  . naloxegol oxalate  12.5 mg Oral Daily  . polyethylene glycol  17 g Oral BID  . prasugrel  10 mg Oral Daily  . psyllium  1 packet Oral BID  . senna-docusate  1 tablet Oral BID  . zolpidem  5 mg Oral QHS   Continuous Infusions: . sodium chloride 75 mL/hr at 10/13/17 1102     LOS: 8 days   Signature  Lala Lund M.D on 10/13/2017 at 1:04 PM  To page go to [email or web address] - password [facility]

## 2017-10-13 NOTE — Progress Notes (Addendum)
CARDIAC REHAB PHASE I   Offered ambulation to pt. Sts he will probably want to go later but is resting now. HR 106 ST in bed, 115 ST moving around. Assisted pt getting comfortable. RN sts she will try to walk with him later.  [place], ACSM 10/13/2017 2:53 PM

## 2017-10-13 NOTE — Procedures (Signed)
PROCEDURE SUMMARY:  Successful US guided right thoracentesis. Yielded 300 mL of hazy amber  fluid. Pt tolerated procedure well. No immediate complications.  Specimen was sent for labs. CXR ordered.  Ascencion Dike PA-C 10/13/2017 12:39 PM

## 2017-10-13 NOTE — Progress Notes (Signed)
Subjective: The patient reports having a rough night.  Objective: Vital signs in last 24 hours: Temp:  [97.9 F (36.6 C)-98.7 F (37.1 C)] 98.5 F (36.9 C) (09/11 0734) Pulse Rate:  [82-128] 82 (09/11 0734) Resp:  [20-34] 26 (09/11 0734) BP: (97-121)/(73-110) 117/85 (09/11 0734) SpO2:  [94 %-99 %] 94 % (09/11 0734) Weight:  [124.5 kg] 124.5 kg (09/11 0102) Last BM Date: 10/12/17  Intake/Output from previous day: 09/10 0701 - 09/11 0700 In: 1002.6 [P.O.:780; I.V.:222.6] Out: -  Intake/Output this shift: No intake/output data recorded.  General appearance: alert and fatigued Resp: clear to auscultation bilaterally Cardio: regular rate and rhythm GI: distended, soft, but tight Extremities: extremities normal, atraumatic, no cyanosis or edema  Lab Results: Recent Labs    10/11/17 0344 10/12/17 0357 10/13/17 0315  WBC 13.9* 13.2* 13.8*  HGB 13.7 13.5 13.4  HCT 42.2 41.8 40.5  PLT 324 278 364   BMET Recent Labs    10/11/17 0344 10/12/17 0357 10/13/17 0315  NA 130* 129* 127*  K 5.5* 4.5 4.6  CL 96* 96* 93*  CO2 24 21* 21*  GLUCOSE 138* 200* 206*  BUN 27* 31* 37*  CREATININE 1.05 1.12 1.13  CALCIUM 8.2* 8.6* 9.1   LFT Recent Labs    10/13/17 0315  PROT 6.0*  ALBUMIN 3.2*  AST 80*  ALT 72*  ALKPHOS 61  BILITOT 1.5*   PT/INR No results for input(s): LABPROT, INR in the last 72 hours. Hepatitis Panel No results for input(s): HEPBSAG, HCVAB, HEPAIGM, HEPBIGM in the last 72 hours. C-Diff No results for input(s): CDIFFTOX in the last 72 hours. Fecal Lactopherrin No results for input(s): FECLLACTOFRN in the last 72 hours.  Studies/Results: Dg Abd Portable 1v  Result Date: 10/12/2017 CLINICAL DATA:  Abdominal distension. EXAM: PORTABLE ABDOMEN - 1 VIEW COMPARISON:  CT abdomen 10/29/2017. Plain film of the abdomen 10/23/2017. FINDINGS: The bowel gas pattern is normal. No radio-opaque calculi or other significant radiographic abnormality are seen.  IMPRESSION: Negative exam. Electronically Signed   By: Inge Rise M.D.   On: 10/12/2017 13:01   Ir Abdomen US Limited  Result Date: 10/11/2017 CLINICAL DATA:  Cirrhosis. Mesenteric mass. Ascites. Paracentesis requested EXAM: LIMITED ABDOMEN ULTRASOUND FOR ASCITES TECHNIQUE: Limited ultrasound survey for ascites was performed in all four abdominal quadrants. COMPARISON:  CT 10/29/2017 FINDINGS: Small amount of abdominal ascites. No pocket large enough for safe paracentesis due to regional bowel. IMPRESSION: Low volume abdominal ascites.  Paracentesis deferred. Electronically Signed   By: Lucrezia Europe M.D.   On: 10/11/2017 16:16    Medications:  Scheduled: . aspirin EC  81 mg Oral Daily  . atorvastatin  40 mg Oral q1800  . buPROPion  300 mg Oral Daily  . feeding supplement (GLUCERNA SHAKE)  237 mL Oral TID BM  . feeding supplement (PRO-STAT SUGAR FREE 64)  30 mL Oral Daily  . heparin injection (subcutaneous)  5,000 Units Subcutaneous Q8H  . insulin aspart  0-15 Units Subcutaneous TID WC  . insulin aspart  4 Units Subcutaneous TID WC  . insulin detemir  14 Units Subcutaneous QHS  . ipratropium-albuterol  3 mL Nebulization TID  . metoprolol tartrate  12.5 mg Oral BID  . naloxegol oxalate  12.5 mg Oral Daily  . polyethylene glycol  17 g Oral BID  . prasugrel  10 mg Oral Daily  . psyllium  1 packet Oral BID  . senna-docusate  1 tablet Oral BID  . simethicone  80 mg Oral QID  .  zolpidem  5 mg Oral QHS   Continuous: . sodium chloride      Assessment/Plan: 1) Hyoventilation. 2) Hemochromatosis with cirrhosis. 3) Obesity. 4) S/p STEMI.   I spoke with radiology at length about the patient's CT scan from the 5th.  It is clear that he does not have any significant fluid overload, however, he has a severe amount of intra-abdominal fat.  There is a marked difference between the 2007 findings and the current findings.  He does report to eating very poorly.  Donuts and sandwiches are some of  the main staples in his diet.  There is a small right pleural effusion and radiology reports that it may help with a thoracentesis, however, in discussion with Dr. Candiss Norse, he appears to be volume depleted.  A CXR will be obtained to see if there is any significant pleural effusion.  Plan: 1) Weight loss, which will occur over time. 2) Agree with the CXR. 3) Continue with supportive care.  LOS: 8 days   Asaad Gulley D 10/13/2017, 8:15 AM

## 2017-10-14 ENCOUNTER — Inpatient Hospital Stay (HOSPITAL_COMMUNITY): Payer: Medicare Other

## 2017-10-14 ENCOUNTER — Encounter (HOSPITAL_COMMUNITY): Payer: Self-pay | Admitting: Interventional Radiology

## 2017-10-14 HISTORY — PX: IR PARACENTESIS: IMG2679

## 2017-10-14 LAB — CBC
HEMATOCRIT: 37.6 % — AB (ref 39.0–52.0)
Hemoglobin: 12.6 g/dL — ABNORMAL LOW (ref 13.0–17.0)
MCH: 29.4 pg (ref 26.0–34.0)
MCHC: 33.5 g/dL (ref 30.0–36.0)
MCV: 87.6 fL (ref 78.0–100.0)
Platelets: 312 10*3/uL (ref 150–400)
RBC: 4.29 MIL/uL (ref 4.22–5.81)
RDW: 14.2 % (ref 11.5–15.5)
WBC: 12.7 10*3/uL — ABNORMAL HIGH (ref 4.0–10.5)

## 2017-10-14 LAB — LACTATE DEHYDROGENASE: LDH: 300 U/L — AB (ref 98–192)

## 2017-10-14 LAB — GLUCOSE, CAPILLARY
GLUCOSE-CAPILLARY: 99 mg/dL (ref 70–99)
Glucose-Capillary: 166 mg/dL — ABNORMAL HIGH (ref 70–99)
Glucose-Capillary: 163 mg/dL — ABNORMAL HIGH (ref 70–99)
Glucose-Capillary: 140 mg/dL — ABNORMAL HIGH (ref 70–99)

## 2017-10-14 LAB — COMPREHENSIVE METABOLIC PANEL
ALBUMIN: 2.9 g/dL — AB (ref 3.5–5.0)
ALT: 76 U/L — ABNORMAL HIGH (ref 0–44)
AST: 81 U/L — AB (ref 15–41)
Alkaline Phosphatase: 57 U/L (ref 38–126)
Anion gap: 11 (ref 5–15)
BUN: 41 mg/dL — AB (ref 8–23)
CHLORIDE: 94 mmol/L — AB (ref 98–111)
CO2: 22 mmol/L (ref 22–32)
Calcium: 8.6 mg/dL — ABNORMAL LOW (ref 8.9–10.3)
Creatinine, Ser: 1.04 mg/dL (ref 0.61–1.24)
GFR calc Af Amer: >60 mL/min (ref >60)
GFR calc non Af Amer: >60 mL/min (ref >60)
Glucose, Bld: 158 mg/dL — ABNORMAL HIGH (ref 70–99)
POTASSIUM: 4.7 mmol/L (ref 3.5–5.1)
Sodium: 127 mmol/L — ABNORMAL LOW (ref 135–145)
Total Bilirubin: 1.4 mg/dL — ABNORMAL HIGH (ref 0.3–1.2)
Total Protein: 5.7 g/dL — ABNORMAL LOW (ref 6.5–8.1)

## 2017-10-14 LAB — PH, BODY FLUID: PH, BODY FLUID: 7.5

## 2017-10-14 LAB — PATHOLOGIST SMEAR REVIEW

## 2017-10-14 LAB — MAGNESIUM: Magnesium: 2.1 mg/dL (ref 1.7–2.4)

## 2017-10-14 MED ORDER — SODIUM CHLORIDE 0.9 % IV SOLN
INTRAVENOUS | Status: DC
  Administered 2017-10-14 – 2017-10-15 (×3): via INTRAVENOUS

## 2017-10-14 MED ORDER — PRO-STAT SUGAR FREE PO LIQD
30.0000 mL | Freq: Two times a day (BID) | ORAL | Status: DC
  Administered 2017-10-14 – 2017-10-21 (×13): 30 mL via ORAL
  Filled 2017-10-14 (×17): qty 30

## 2017-10-14 MED ORDER — SODIUM CHLORIDE 0.9 % IV BOLUS
500.0000 mL | Freq: Once | INTRAVENOUS | Status: AC
  Administered 2017-10-14: 500 mL via INTRAVENOUS

## 2017-10-14 MED ORDER — LIDOCAINE HCL (PF) 1 % IJ SOLN
INTRAMUSCULAR | Status: DC | PRN
  Administered 2017-10-14: 10 mL

## 2017-10-14 MED ORDER — GLUCERNA SHAKE PO LIQD
237.0000 mL | Freq: Two times a day (BID) | ORAL | Status: DC
  Administered 2017-10-16 – 2017-10-20 (×7): 237 mL via ORAL

## 2017-10-14 MED ORDER — ALBUMIN HUMAN 25 % IV SOLN
50.0000 g | Freq: Once | INTRAVENOUS | Status: AC | PRN
  Administered 2017-10-14: 50 g via INTRAVENOUS
  Filled 2017-10-14: qty 200

## 2017-10-14 MED ORDER — LIDOCAINE HCL (PF) 2 % IJ SOLN
INTRAMUSCULAR | Status: AC
  Filled 2017-10-14: qty 20

## 2017-10-14 NOTE — Progress Notes (Signed)
Patient received a hep injection in the Abdominal, site began to profusely bleed. Site was dressed twice. Will continue to monitor

## 2017-10-14 NOTE — Progress Notes (Signed)
Subjective: Feeling better.  Objective: Vital signs in last 24 hours: Temp:  [97.9 F (36.6 C)-98.6 F (37 C)] 97.9 F (36.6 C) (09/12 0848) Pulse Rate:  [55-118] 55 (09/12 0848) Resp:  [19-29] 22 (09/12 0848) BP: (88-120)/(66-86) 106/75 (09/12 0848) SpO2:  [94 %-98 %] 96 % (09/12 0848) Weight:  [113.4 kg] 113.4 kg (09/12 0615) Last BM Date: 10/13/17  Intake/Output from previous day: 09/11 0701 - 09/12 0700 In: 581.8 [P.O.:360; I.V.:221.8] Out: -  Intake/Output this shift: No intake/output data recorded.  General appearance: fatigued GI: distended, but soft  Lab Results: Recent Labs    10/12/17 0357 10/13/17 0315 10/14/17 0340  WBC 13.2* 13.8* 12.7*  HGB 13.5 13.4 12.6*  HCT 41.8 40.5 37.6*  PLT 278 364 312   BMET Recent Labs    10/12/17 0357 10/13/17 0315 10/14/17 0340  NA 129* 127* 127*  K 4.5 4.6 4.7  CL 96* 93* 94*  CO2 21* 21* 22  GLUCOSE 200* 206* 158*  BUN 31* 37* 41*  CREATININE 1.12 1.13 1.04  CALCIUM 8.6* 9.1 8.6*   LFT Recent Labs    10/14/17 0340  PROT 5.7*  ALBUMIN 2.9*  AST 81*  ALT 76*  ALKPHOS 57  BILITOT 1.4*   PT/INR No results for input(s): LABPROT, INR in the last 72 hours. Hepatitis Panel No results for input(s): HEPBSAG, HCVAB, HEPAIGM, HEPBIGM in the last 72 hours. C-Diff No results for input(s): CDIFFTOX in the last 72 hours. Fecal Lactopherrin No results for input(s): FECLLACTOFRN in the last 72 hours.  Studies/Results: Ct Abdomen Pelvis Wo Contrast  Result Date: 10/14/2017 CLINICAL DATA:  Abdominal distention, nausea, vomiting EXAM: CT ABDOMEN AND PELVIS WITHOUT CONTRAST TECHNIQUE: Multidetector CT imaging of the abdomen and pelvis was performed following the standard protocol without IV contrast. COMPARISON:  10/03/2017 FINDINGS: Lower chest: Small bilateral pleural effusions, right greater than left, similar to prior study. Compressive atelectasis in the lower lobes. Heart is normal size. Densely calcified coronary  arteries. Hepatobiliary: Nodular contours compatible with cirrhosis. No visible focal hepatic abnormality. Gallbladder grossly unremarkable. Pancreas: No focal abnormality or ductal dilatation. Spleen: No focal abnormality.  Normal size. Adrenals/Urinary Tract: Small low-density nodule in the lateral limb of the left adrenal gland, 14 mm, likely adenoma. No suspicious adrenal mass. Exophytic low-density areas off the kidneys bilaterally most compatible with cysts. No hydronephrosis. Urinary bladder grossly unremarkable. Stomach/Bowel: Stomach, large and small bowel grossly unremarkable. Vascular/Lymphatic: Aortic atherosclerosis. No enlarged abdominal or pelvic lymph nodes. Reproductive: No visible focal abnormality. Other: Moderate to large volume ascites in the abdomen and pelvis. There is stranding/thickening noted in the omentum anteriorly best seen within the pelvis. 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 Musculoskeletal: No acute bony abnormality. 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 Differential considerations remain the same including lymphoma, GI stromal tumor, primary duodenal carcinoma, carcinoid, or atypical pancreatic cancer. There is associated moderate to large volume ascites and omental thickening concerning for peritoneal involvement. Cirrhosis. Small bilateral pleural effusions with compressive atelectasis in the lower lobes, stable. Densely calcified coronary arteries diffusely. Aortic atherosclerosis. Electronically Signed   By: Rolm Baptise M.D.   On: 10/14/2017 07:41   Dg Chest 1 View  Result Date: 10/13/2017 CLINICAL DATA:  Status post right-sided thoracentesis. EXAM: CHEST  1 VIEW COMPARISON:  Chest x-ray 10/13/2017 and chest CT 10/08/2017. FINDINGS: Reduction in right-sided  pleural effusion following the thoracentesis. No  postprocedural pneumothorax. There is a small left effusion and bibasilar atelectasis. IMPRESSION: Reduction in right-sided pleural effusion status post thoracentesis. No postprocedural pneumothorax. Small left effusion and bibasilar atelectasis. Electronically Signed   By: Marijo Sanes M.D.   On: 10/13/2017 13:03   Dg Chest 2 View  Result Date: 10/13/2017 CLINICAL DATA:  Short of breath EXAM: CHEST - 2 VIEW COMPARISON:  10/16/2017 FINDINGS: Hypoventilation. Decreased lung volume with bibasilar atelectasis. Small bilateral pleural effusions right greater than left. Negative for edema. Upper lobes clear IMPRESSION: Bibasilar atelectasis and bilateral effusions right greater than left. Negative for edema. Electronically Signed   By: Franchot Gallo M.D.   On: 10/13/2017 09:21   Ir Paracentesis  Result Date: 10/14/2017 INDICATION: 67 year old male with a history of recurrent ascites EXAM: ULTRASOUND GUIDED  PARACENTESIS MEDICATIONS: None. COMPLICATIONS: None PROCEDURE: Informed written consent was obtained from the patient after a discussion of the risks, benefits and alternatives to treatment. A timeout was performed prior to the initiation of the procedure. Initial ultrasound scanning demonstrates a small amount of ascites within the right lower abdominal quadrant. The right lower abdomen was prepped and draped in the usual sterile fashion. 1% lidocaine with epinephrine was used for local anesthesia. Following this, a 8 Fr Safe-T-Centesis catheter was introduced. An ultrasound image was saved for documentation purposes. The paracentesis was performed. The catheter was removed and a dressing was applied. The patient tolerated the procedure well without immediate post procedural complication. FINDINGS: A total of approximately 6,400 of blood tinged fluid was removed. IMPRESSION: Status post ultrasound-guided paracentesis. Electronically Signed   By: Corrie Mckusick D.O.   On: 10/14/2017 12:57   Ir Thoracentesis  Asp Pleural Space W/img Guide  Result Date: 10/13/2017 INDICATION: Shortness of breath. Right-sided pleural effusion on imaging. Request diagnostic and therapeutic thoracentesis. EXAM: ULTRASOUND GUIDED RIGHT THORACENTESIS MEDICATIONS: None. COMPLICATIONS: None immediate. Postprocedural chest x-ray negative for pneumothorax. PROCEDURE: An ultrasound guided thoracentesis was thoroughly discussed with the patient and questions answered. The benefits, risks, alternatives and complications were also discussed. The patient understands and wishes to proceed with the procedure. Written consent was obtained. Ultrasound was performed to localize and mark an small but adequate pocket of fluid in the right chest. The area was then prepped and draped in the normal sterile fashion. 1% Lidocaine was used for local anesthesia. Under ultrasound guidance a 6 Fr Safe-T-Centesis catheter was introduced. Thoracentesis was performed. The catheter was removed and a dressing applied. FINDINGS: A total of approximately 300 mL of hazy, amber colored fluid was removed. Samples were sent to the laboratory as requested by the clinical team. IMPRESSION: Successful ultrasound guided right thoracentesis yielding 300 mL of pleural fluid. Read by: Ascencion Dike PA-C Electronically Signed   By: Lucrezia Europe M.D.   On: 10/13/2017 13:26    Medications:  Scheduled: . aspirin EC  81 mg Oral Daily  . atorvastatin  40 mg Oral q1800  . buPROPion  300 mg Oral Daily  . feeding supplement (GLUCERNA SHAKE)  237 mL Oral BID BM  . feeding supplement (PRO-STAT SUGAR FREE 64)  30 mL Oral BID  . heparin injection (subcutaneous)  5,000 Units Subcutaneous Q8H  . insulin aspart  0-15 Units Subcutaneous TID WC  . insulin aspart  4 Units Subcutaneous TID WC  . insulin detemir  14 Units Subcutaneous QHS  . ipratropium-albuterol  3 mL Nebulization TID  . lidocaine      . metoprolol tartrate  12.5  mg Oral BID  . naloxegol oxalate  12.5 mg Oral Daily  .  polyethylene glycol  17 g Oral BID  . prasugrel  10 mg Oral Daily  . psyllium  1 packet Oral BID  . senna-docusate  1 tablet Oral BID  . zolpidem  5 mg Oral QHS   Continuous: . sodium chloride 100 mL/hr at 10/14/17 1355    Assessment/Plan: 1) Ascites s/p paracentesis. 2) Cirrhosis. 3) S/p STEMI.   The patient reports feeling much better with undergoing the paracentesis.  6.4 liters was removed with the procedure and there is a significant improvement in his abdominal distension.  His abdomen is soft.  Unfortunately his blood pressure remains on the low side.  Ascites will reaccumulate in the following days as he is not on any diuretics.  He now exhibits lower extremity edema 1-2+.  This is a difficult situation with his fluid management, recent MI and relative hypotension.  Plan: 1) Step I diuretics as soon as reasonably possible.  Otherwise he will need to have a repeat paracentesis in the next few days. 2) Minimize IV fluids, or discontinue, if possible. 3) Maintain 2 gram sodium diet.  LOS: 9 days   Tashawnda Bleiler D 10/14/2017, 2:00 PM

## 2017-10-14 NOTE — Progress Notes (Signed)
Patient departed unit for CT

## 2017-10-14 NOTE — Progress Notes (Signed)
CARDIAC REHAB PHASE I   PRE:  Rate/Rhythm: 111 ST  BP:  Supine: 102/77  Sitting:   Standing:    SaO2: 95% 2L  MODE:  Ambulation: 60 ft   POST:  Rate/Rhythm: 121 ST  BP:  Supine:   Sitting: 96/55  Standing:    SaO2: 93% 2L 1000-1030 Pt willing to try to walk. Pt walked 60 ft on 2L with rolling walker and asst x 2. Encouraged pt to take his time and to take deep breaths. He was DOE and stopped twice to rest. After settling pt back in bed he coughed up green phlegm. Walked pt 3 days ago and he was able to walk 470 ft on RA and no DOE noted. Pt getting ready to go to CT.   Graylon Good, RN BSN  10/14/2017 10:25 AM

## 2017-10-14 NOTE — Procedures (Signed)
Interventional Radiology Procedure Note  Procedure: US guided paracentesis.  Findings: Low volume ascites. The CT performed last night shows low volume ascites in the paracolic gutters.  .  Complications: None  Recommendations:    - Routine wound care   Signed,  Dulcy Fanny. Earleen Newport, DO

## 2017-10-14 NOTE — Progress Notes (Signed)
Subjective:  C/O abdominal discomfort and dyspnea. No chest pain. Very weak and tired  Objective:  Vital Signs in the last 24 hours: Temp:  [97.9 F (36.6 C)-98.6 F (37 C)] 97.9 F (36.6 C) (09/12 0848) Pulse Rate:  [55-123] 55 (09/12 0848) Resp:  [19-29] 22 (09/12 0848) BP: (88-120)/(66-86) 106/75 (09/12 0848) SpO2:  [92 %-98 %] 96 % (09/12 0848) Weight:  [113.4 kg] 113.4 kg (09/12 0615)  Intake/Output from previous day: 09/11 0701 - 09/12 0700 In: 581.8 [P.O.:360; I.V.:221.8] Out: -   Physical Exam: Blood pressure 108/67, pulse (!) 107, temperature 97.9 F (36.6 C), temperature source Oral, resp. rate 19, height 6\' 4"  (1.93 m), weight 120 kg, SpO2 92 %.   General appearance: alert, cooperative, appears older than stated age, fatigued and mild distress Eyes: negative findings: lids and lashes normal Neck: no adenopathy, no carotid bruit, no JVD, supple, symmetrical, trachea midline, thyroid not enlarged, symmetric, no tenderness/mass/nodules and difficult to make details due to mild respiratory distress and obesity Neck: JVP - normal, carotids 2+= without bruits Resp: clear to auscultation bilaterally Chest wall: no tenderness Cardio: regular rate and rhythm, S1, S2 normal, no murmur, click, rub or gallop GI: abnormal findings:  ascites, distended, hypoactive bowel sounds and shifting dullness and scrotal and penile edema Extremities: extremities normal, atraumatic, no cyanosis or edema    Lab Results: BMP Recent Labs    10/12/17 0357 10/13/17 0315 10/14/17 0340  NA 129* 127* 127*  K 4.5 4.6 4.7  CL 96* 93* 94*  CO2 21* 21* 22  GLUCOSE 200* 206* 158*  BUN 31* 37* 41*  CREATININE 1.12 1.13 1.04  CALCIUM 8.6* 9.1 8.6*  GFRNONAA >60 >60 >60  GFRAA >60 >60 >60    CBC Recent Labs  Lab 10/14/17 0340  WBC 12.7*  RBC 4.29  HGB 12.6*  HCT 37.6*  PLT 312  MCV 87.6  MCH 29.4  MCHC 33.5  RDW 14.2    HEMOGLOBIN A1C Lab Results  Component Value Date    HGBA1C 6.9 (H) 10/06/2017   MPG 151.33 10/06/2017    Cardiac Panel (last 3 results) Recent Labs    10/10/2017 1146 10/06/17 0331 10/08/17 1436  TROPONINI 0.03* 26.74* 4.50*    BNP (last 3 results) No results for input(s): PROBNP in the last 8760 hours.  TSH Recent Labs    10/08/17 1436 10/10/17 1540  TSH 0.812 1.201    Lipid Panel     Component Value Date/Time   CHOL 96 10/06/2017 0331   TRIG 160 (H) 10/06/2017 0331   HDL 23 (L) 10/06/2017 0331   CHOLHDL 4.2 10/06/2017 0331   VLDL 32 10/06/2017 0331   LDLCALC 41 10/06/2017 0331     Hepatic Function Panel Recent Labs    10/12/17 0357 10/13/17 0315 10/14/17 0340  PROT 6.0* 6.0* 5.7*  ALBUMIN 3.3* 3.2* 2.9*  AST 72* 80* 81*  ALT 80* 72* 76*  ALKPHOS 57 61 57  BILITOT 0.8 1.5* 1.4*    Imaging: Ct Abdomen Pelvis Wo Contrast  Result Date: 10/14/2017 CLINICAL DATA:  Abdominal distention, nausea, vomiting EXAM: CT ABDOMEN AND PELVIS WITHOUT CONTRAST TECHNIQUE: Multidetector CT imaging of the abdomen and pelvis was performed following the standard protocol without IV contrast. COMPARISON:  10/17/2017 FINDINGS: Lower chest: Small bilateral pleural effusions, right greater than left, similar to prior study. Compressive atelectasis in the lower lobes. Heart is normal size. Densely calcified coronary arteries. Hepatobiliary: Nodular contours compatible with cirrhosis. No visible focal hepatic abnormality. Gallbladder  grossly unremarkable. Pancreas: No focal abnormality or ductal dilatation. Spleen: No focal abnormality.  Normal size. Adrenals/Urinary Tract: Small low-density nodule in the lateral limb of the left adrenal gland, 14 mm, likely adenoma. No suspicious adrenal mass. Exophytic low-density areas off the kidneys bilaterally most compatible with cysts. No hydronephrosis. Urinary bladder grossly unremarkable. Stomach/Bowel: Stomach, large and small bowel grossly unremarkable. Vascular/Lymphatic: Aortic atherosclerosis. No  enlarged abdominal or pelvic lymph nodes. Reproductive: No visible focal abnormality. Other: Moderate to large volume ascites in the abdomen and pelvis. There is stranding/thickening noted in the omentum anteriorly best seen within the pelvis. 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 Musculoskeletal: No acute bony abnormality. 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 Differential considerations remain the same including lymphoma, GI stromal tumor, primary duodenal carcinoma, carcinoid, or atypical pancreatic cancer. There is associated moderate to large volume ascites and omental thickening concerning for peritoneal involvement. Cirrhosis. Small bilateral pleural effusions with compressive atelectasis in the lower lobes, stable. Densely calcified coronary arteries diffusely. Aortic atherosclerosis. Electronically Signed   By: Rolm Baptise M.D.   On: 10/14/2017 07:41   Dg Chest 1 View  Result Date: 10/13/2017 CLINICAL DATA:  Status post right-sided thoracentesis. EXAM: CHEST  1 VIEW COMPARISON:  Chest x-ray 10/13/2017 and chest CT 10/08/2017. FINDINGS: Reduction in right-sided pleural effusion following the thoracentesis. No postprocedural pneumothorax. There is a small left effusion and bibasilar atelectasis. IMPRESSION: Reduction in right-sided pleural effusion status post thoracentesis. No postprocedural pneumothorax. Small left effusion and bibasilar atelectasis. Electronically Signed   By: Marijo Sanes M.D.   On: 10/13/2017 13:03   Dg Chest 2 View  Result Date: 10/13/2017 CLINICAL DATA:  Short of breath EXAM: CHEST - 2 VIEW COMPARISON:  10/27/2017 FINDINGS: Hypoventilation. Decreased lung volume with bibasilar atelectasis. Small bilateral pleural effusions right greater than left. Negative for edema. Upper lobes clear IMPRESSION: Bibasilar  atelectasis and bilateral effusions right greater than left. Negative for edema. Electronically Signed   By: Franchot Gallo M.D.   On: 10/13/2017 09:21   Ir Paracentesis  Result Date: 10/14/2017 INDICATION: 67 year old male with a history of recurrent ascites EXAM: ULTRASOUND GUIDED  PARACENTESIS MEDICATIONS: None. COMPLICATIONS: None PROCEDURE: Informed written consent was obtained from the patient after a discussion of the risks, benefits and alternatives to treatment. A timeout was performed prior to the initiation of the procedure. Initial ultrasound scanning demonstrates a small amount of ascites within the right lower abdominal quadrant. The right lower abdomen was prepped and draped in the usual sterile fashion. 1% lidocaine with epinephrine was used for local anesthesia. Following this, a 8 Fr Safe-T-Centesis catheter was introduced. An ultrasound image was saved for documentation purposes. The paracentesis was performed. The catheter was removed and a dressing was applied. The patient tolerated the procedure well without immediate post procedural complication. FINDINGS: A total of approximately 6,400 of blood tinged fluid was removed. IMPRESSION: Status post ultrasound-guided paracentesis. Electronically Signed   By: Corrie Mckusick D.O.   On: 10/14/2017 12:57   Ir Thoracentesis Asp Pleural Space W/img Guide  Result Date: 10/13/2017 INDICATION: Shortness of breath. Right-sided pleural effusion on imaging. Request diagnostic and therapeutic thoracentesis. EXAM: ULTRASOUND GUIDED RIGHT THORACENTESIS MEDICATIONS: None. COMPLICATIONS: None immediate. Postprocedural chest x-ray negative for pneumothorax. PROCEDURE: An ultrasound guided thoracentesis was thoroughly discussed with the patient and questions answered. The benefits, risks, alternatives and complications  were also discussed. The patient understands and wishes to proceed with the procedure. Written consent was obtained. Ultrasound was performed  to localize and mark an small but adequate pocket of fluid in the right chest. The area was then prepped and draped in the normal sterile fashion. 1% Lidocaine was used for local anesthesia. Under ultrasound guidance a 6 Fr Safe-T-Centesis catheter was introduced. Thoracentesis was performed. The catheter was removed and a dressing applied. FINDINGS: A total of approximately 300 mL of hazy, amber colored fluid was removed. Samples were sent to the laboratory as requested by the clinical team. IMPRESSION: Successful ultrasound guided right thoracentesis yielding 300 mL of pleural fluid. Read by: Ascencion Dike PA-C Electronically Signed   By: Lucrezia Europe M.D.   On: 10/13/2017 13:26    Cardiac Studies:  EKG: 10/13/2017: Sinus tachycardia at the rate of 121 bpm, otherwise normal EKG.  ECHO:  10/08/2017: Left ventricle: The cavity size was normal. Wall thickness was  increased in a pattern of mild LVH. The estimated ejection  fraction was 45%. Akinesis of the apical septal wall, the apical  anterior wall, and the true apex. (10/15/2017) (grade 2 diastolic dysfunction). - Mitral valve: Mildly calcified annulus.  Scheduled Meds: . aspirin EC  81 mg Oral Daily  . atorvastatin  40 mg Oral q1800  . buPROPion  300 mg Oral Daily  . feeding supplement (GLUCERNA SHAKE)  237 mL Oral BID BM  . feeding supplement (PRO-STAT SUGAR FREE 64)  30 mL Oral BID  . heparin injection (subcutaneous)  5,000 Units Subcutaneous Q8H  . insulin aspart  0-15 Units Subcutaneous TID WC  . insulin aspart  4 Units Subcutaneous TID WC  . insulin detemir  14 Units Subcutaneous QHS  . ipratropium-albuterol  3 mL Nebulization TID  . metoprolol tartrate  12.5 mg Oral BID  . naloxegol oxalate  12.5 mg Oral Daily  . polyethylene glycol  17 g Oral BID  . prasugrel  10 mg Oral Daily  . psyllium  1 packet Oral BID  . senna-docusate  1 tablet Oral BID  . zolpidem  5 mg Oral QHS   Continuous Infusions: . sodium chloride 100 mL/hr at  10/15/17 0339   PRN Meds:.acetaminophen, diazepam, ipratropium-albuterol, lidocaine (PF), lidocaine, morphine, nitroGLYCERIN, ondansetron (ZOFRAN) IV   Assessment/Plan:  1.  STEMI, anterolateral wall S/P Stenting to mid LAD on 10/24/2017 LM: Normal LAD: Prox LAD severely calcified 80% stenosis (Non-culprit). Mid LAD 100% Occluded just distal to prior 3 mm Resolute stent placed 05/22/14 which is patent.[name] LCx: Mild prox disease RCA: Patent prox-mid stent. Focal 50% restenosis  Successful PTCA and stent to mid LAD (3.0 X 12 mm Resolute drug-eluting stent) placement, plus aspiration thrombectomy. Needs elective atherectomy of the proximal LAD once patient is stable. H/O RCA angioplasty in 2009 restenosis S/P 3.5x26 and 3.0x30 mm Resolute stent on 06/07/14.  2.  Ascites- massive and mild respiratory distress.  CT suggestive of fairly  Advanced malignancy and massive ascites and cirrhosis. 3.  Chronic pain syndrome with chronic back pain and multiple surgeries in past 4.  Diabetes mellitus type 2 controlled without hyperglycemia without complications 5. 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  Rec: I have reviewed the telemetry, he has not had any ventricular tachycardia or even a single PVC.  In spite of sinus tachycardia, no ischemic changes on the EKG.  His main issues continued to be mostly GI related.  I have discussed with Dr. Candiss Norse regarding attempting Peritoneocentesis. (procedure noted - 6400  ml aspirated and 300 ml pleuritic fluid). I will add aldactone and f/u on BMP Saturday.  From cardiac standpoint stable.  Adrian Prows, M.D. 10/14/2017, 9:41 AM [facility] Pager: [phone] Office: [phone] If no answer: [phone]

## 2017-10-14 NOTE — Progress Notes (Signed)
[facility] TEAM 1 - Stepdown/ICU TEAM  Daniel Rivers  WUJ:[id number] DOB: 05/13/1950 DOA: 10/26/2017 PCP: [name], MD    Brief Narrative:  67 year old M w/ a hx of ankylosing spondylitis, anxiety, BPH, chronic back pain, CAD, GERD, dyslipidemia, obesity, nonalcoholic cirrhosis, hemochromatosis, and DM2 who was admitted by Cardiology on 10/08/2017 for a STEMI requiring a stent placement. Subsequently he developed abdominal pain and distention and was found to have ascites, GI was consulted. The patient was transferred to the [facility] service on 10/09/2017 for management of his medical issues.  The patient has recurrent ascites, he is hypotensive and tachycardic.  Significant Events: 10/15/2017 - Left heart catheterization with LAD DES placement  10/24/2017 - TTE -  EF 35% to 40%.Severe hypokinesis of the anteroseptal and apical myocardium - grade 2 diastolic dysfunction 10/07/60 [place] - Cirrhosis of the liver - small amount of abdominal ascites - 1.4 cm hypoechoic lesion upper pole left kidney, complex cystic versus small solid nodule 10/08/2017  CTchest  No pulmonary emboli 10/08/17 TTE - mild LVH - EF 45% - akinesis of the apical septal wall, the apicalanterior wall, and the true apex 10/08/17 -  Ultrasound-guided paracentesis by IR yielding 1 L of fluid.  Neutrophil count close to 200, SAAG score cannot be calculated as there was no serum albumin from that day. 10/08/17 CT potential mass lesion in the central mesentery positioned between the SMV and second portion duodenum. Consider endoscopic ultrasound (EUS) evaluation and tumor sampling. FDG PET scan may be beneficial on outpatient basis.  10/11/2017 Repeat paracentesis ordered but could not be done 10/13/2017.  CT scan abdomen pelvis stat ordered, study completed 10/14/2017 showing large ascites. 10/14/2017.  Repeat paracentesis again ordered.    Subjective: Patient in bed, appears comfortable, denies any headache, no fever, no chest pain or pressure, still feels short  of breath, no abdominal pain. No focal weakness.  Assessment & Plan:   1.  Initial hospital admission for STEMI caused by LAD lesion requiring DES placement this admission.    Cardiology on board, he is currently chest pain-free and this problem seems to be stable, continue dual antiplatelet therapy along with beta-blocker and statin for secondary prevention.  Defer management of this problem to cardiology.  2.  History of hemochromatosis, hemochromatosis induced cirrhosis.  With continued ascites, he had a therapeutic and diagnostic paracentesis few days ago with 1 L of fluid removed on 10/08/2017, received empiric Rocephin but GI has ruled out SBP and stop antibiotics.  Still has massive abdominal distention and a repeat ultrasound-guided paracentesis was requested on 10/11/2017 unfortunately fluid could not be located by ultrasound.  CT abdomen pelvis stat was ordered on 10/13/2017 which due to scheduling problems could not be done until early morning of 10/14/2017 and it now reveals large amount of ascites, I have personally discussed his case with radiologist Dr. Sharyn Blitz who will conduct ultrasound-guided paracentesis and try to remove between 5 to 6 L of fluid with albumin and IV fluid support, I have also discussed his case with GI physician Dr. Benson Norway today, I think patient and all has over 15 L of fluid in his belly that we will gradually try and remove..   3.  Shortness of breath.  Caused by abdominal distention along with moderate right-sided pleural effusion, see #2 as above, will order ultrasound-guided thoracentesis on the right side to be done by IR on 10/13/2017 and preliminary by protein differentiation appears to be exudative, LDH in the serum is pending, kindly  follow cytology, continue supportive care with oxygen nebulizer treatment, encouraged to sit up in chair and use flutter valve along with I-S.  4.  Hypotension, tachycardia, dry oral mucosa - severely intravascularly dehydrated,  he is hyponatremic and hypochloremic, hold diuretics and hydrate with IV fluids.  5.  Hyponatremia and hypochloremia.    See #4 above.  6.  Moderate to severe protein calorie malnutrition.  On Protostat.  7.  Mildly elevated lactate.  This is due to intravascular dehydration.  Hydrate and monitor.  He does not appear septic.  8.  Dyslipidemia.  On statin.  9.  Left upper pole kidney cyst.  Outpatient PCP follow-up.    10.  Potential abdominal mass.  Defer that to GI.  Dr. Benson Norway on board.  11.  Severe abdominal distention.  See #2 above.    12. DM. type II -gently on Levemir along with sliding scale.  Monitor and adjust.  CBG (last 3)  Recent Labs    10/13/17 1701 10/13/17 2205 10/14/17 0605  GLUCAP 169* 153* 166*      DVT prophylaxis: [place] heparin  Code Status: FULL CODE Family Communication: no family present at time of exam  Disposition Plan: SDU    Consultants:  GI PCCM Cardiology  IR  Antimicrobials:  Rocephin 9/6 > 9/8  Objective: Blood pressure 106/75, pulse (!) 55, temperature 97.9 F (36.6 C), temperature source Axillary, resp. rate (!) 22, height 6\' 4"  (1.93 m), weight 113.4 kg, SpO2 96 %.  Intake/Output Summary (Last 24 hours) at 10/14/2017 1258 Last data filed at 10/14/2017 0616 Gross per 24 hour  Intake 581.82 ml  Output -  Net 581.82 ml   Filed Weights   10/12/17 0256 10/13/17 0102 10/14/17 0615  Weight: 122.9 kg 124.5 kg 113.4 kg    Examination:  Awake Alert, Oriented X 3, No new F.N deficits, Normal affect [place].AT,PERRAL Supple Neck,No JVD, No cervical lymphadenopathy appriciated.  Symmetrical Chest wall movement, Good air movement bilaterally, CTAB RRR,No Gallops, Rubs or new Murmurs, No Parasternal Heave +ve B.Sounds, massive abdominal distention, No tenderness, No organomegaly appriciated, No rebound - guarding or rigidity. No Cyanosis, Clubbing or edema, No new Rash or bruise    CBC: Recent Labs  Lab 10/12/17 0357  10/13/17 0315 10/14/17 0340  WBC 13.2* 13.8* 12.7*  HGB 13.5 13.4 12.6*  HCT 41.8 40.5 37.6*  MCV 89.7 87.1 87.6  PLT 278 364 505   Basic Metabolic Panel: Recent Labs  Lab 10/12/17 0357 10/13/17 0315 10/14/17 0340  NA 129* 127* 127*  K 4.5 4.6 4.7  CL 96* 93* 94*  CO2 21* 21* 22  GLUCOSE 200* 206* 158*  BUN 31* 37* 41*  CREATININE 1.12 1.13 1.04  CALCIUM 8.6* 9.1 8.6*  MG 2.0 2.1 2.1  PHOS 4.2 4.6  --    GFR: Estimated Creatinine Clearance: 95 mL/min (by C-G formula based on SCr of 1.04 mg/dL).  Liver Function Tests: Recent Labs  Lab 10/11/17 0344 10/12/17 0357 10/13/17 0315 10/14/17 0340  AST 105* 72* 80* 81*  ALT 63* 80* 72* 76*  ALKPHOS 55 57 61 57  BILITOT 1.9* 0.8 1.5* 1.4*  PROT 5.4* 6.0* 6.0* 5.7*  ALBUMIN 2.6* 3.3* 3.2* 2.9*    Coagulation Profile: No results for input(s): INR, PROTIME in the last 168 hours.  Cardiac Enzymes: Recent Labs  Lab 10/08/17 1436  TROPONINI 4.50*    HbA1C: Hgb A1c MFr Bld  Date/Time Value Ref Range Status  10/06/2017 03:31 AM 6.9 (H) 4.8 -  5.6 % Final    Comment:    (NOTE) Pre diabetes:          5.7%-6.4% Diabetes:              >6.4% Glycemic control for   <7.0% adults with diabetes   05/22/2014 03:10 PM 6.5 (H) 4.8 - 5.6 % Final    Comment:    (NOTE)         Pre-diabetes: 5.7 - 6.4         Diabetes: >6.4         Glycemic control for adults with diabetes: <7.0     CBG: Recent Labs  Lab 10/13/17 0558 10/13/17 1343 10/13/17 1701 10/13/17 2205 10/14/17 0605  GLUCAP 220* 193* 169* 153* 166*    Recent Results (from the past 240 hour(s))  MRSA PCR Screening     Status: Abnormal   Collection Time: 10/10/2017  3:45 PM  Result Value Ref Range Status   MRSA by PCR POSITIVE (A) NEGATIVE Final    Comment:        The GeneXpert MRSA Assay (FDA approved for NASAL specimens only), is one component of a comprehensive MRSA colonization surveillance program. It is not intended to diagnose MRSA infection nor  to guide or monitor treatment for MRSA infections. RESULT CALLED TO, READ BACK BY AND VERIFIED WITH: Angelica Chessman RN 17:20 10/07/2017 ([email or web address]) Performed at [facility], [place] [address], [place], [place] [address]   Stat Gram stain     Status: None   Collection Time: 10/08/17  7:31 PM  Result Value Ref Range Status   Specimen Description PERITONEAL  Final   Special Requests NONE  Final   Gram Stain   Final    FEW WBC PRESENT,BOTH PMN AND MONONUCLEAR NO ORGANISMS SEEN Performed at [facility], [address] [place], [place] [address]    Report Status 10/08/2017 FINAL  Final  Culture, body fluid-bottle     Status: None   Collection Time: 10/08/17  7:31 PM  Result Value Ref Range Status   Specimen Description PERITONEAL  Final   Special Requests NONE  Final   Culture   Final    NO GROWTH 5 DAYS Performed at [facility] [address] [place], [place] [address]    Report Status 10/13/2017 FINAL  Final  Culture, Urine     Status: None   Collection Time: 10/09/17  7:47 PM  Result Value Ref Range Status   Specimen Description URINE, RANDOM  Final   Special Requests NONE  Final   Culture   Final    NO GROWTH Performed at [facility], [place] [address], [place], [place] [address]    Report Status 10/10/2017 FINAL  Final  Gram stain     Status: None   Collection Time: 10/13/17 12:44 PM  Result Value Ref Range Status   Specimen Description PLEURAL RIGHT  Final   Special Requests NONE  Final   Gram Stain   Final    RARE WBC PRESENT, PREDOMINANTLY PMN NO ORGANISMS SEEN Performed at [facility], [place] [address], [place], [place] [address]    Report Status 10/13/2017 FINAL  Final  Culture, body fluid-bottle     Status: None (Preliminary result)   Collection Time: 10/13/17 12:44 PM  Result Value Ref Range Status   Specimen Description PLEURAL RIGHT  Final   Special Requests NONE  Final   Culture   Final  NO GROWTH < 24 HOURS Performed at [facility] [address] [place], [place] [address]    Report Status PENDING  Incomplete     Scheduled Meds: . aspirin EC  81 mg Oral Daily  . atorvastatin  40 mg Oral q1800  . buPROPion  300 mg Oral Daily  . feeding supplement (GLUCERNA SHAKE)  237 mL Oral BID BM  . feeding supplement (PRO-STAT SUGAR FREE 64)  30 mL Oral BID  . heparin injection (subcutaneous)  5,000 Units Subcutaneous Q8H  . insulin aspart  0-15 Units Subcutaneous TID WC  . insulin aspart  4 Units Subcutaneous TID WC  . insulin detemir  14 Units Subcutaneous QHS  . ipratropium-albuterol  3 mL Nebulization TID  . lidocaine      . metoprolol tartrate  12.5 mg Oral BID  . naloxegol oxalate  12.5 mg Oral Daily  . polyethylene glycol  17 g Oral BID  . prasugrel  10 mg Oral Daily  . psyllium  1 packet Oral BID  . senna-docusate  1 tablet Oral BID  . zolpidem  5 mg Oral QHS   Continuous Infusions: . sodium chloride 75 mL/hr at 10/14/17 1100     LOS: 9 days   Signature  Lala Lund M.D on 10/14/2017 at 12:58 PM  To page go to [email or web address] - password [email or web address]

## 2017-10-14 NOTE — Progress Notes (Signed)
Nutrition Follow Up  DOCUMENTATION CODES:   Non-severe (moderate) malnutrition in context of chronic illness  INTERVENTION:    Glucerna Shake po BID, each supplement provides 220 kcal and 10 grams of protein  Increase Prostat to 30 ml po BID, each supplement provides 100 kcal and 15 gm protein  NUTRITION DIAGNOSIS:   Moderate Malnutrition related to chronic illness(cirrhosis, CAD) as evidenced by mild fat depletion, moderate fat depletion, mild muscle depletion, moderate muscle depletion, ongoing  GOAL:   Patient will meet greater than or equal to 90% of their needs, progressing  MONITOR:   PO intake, Supplement acceptance, Weight trends, Skin  ASSESSMENT:   67 yo male with PMH of HTN, HLD, DM-2, GERD, chronic back pain, CAD, and non-alcoholic cirrhosis who was admitted on 9/3 with STEMI s/p stent placement 9/5. 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   9/6 transferred out of [facility]  Patient not in his room at time of RD visit. Noted he is s/p paracentesis per IR earlier today. PO intake variable at 25-100% per flowsheet records.  Average meal completion is approximately 55%. Labs & medications reviewed. Na 127 (L). CBG's [phone].  Per MAR, pt drinking some of his Glucerna Shakes.  He is taking Prostat liquid protein 30 ml daily. Not meeting his estimated protein needs.  Diet Order:   Diet Order            Diet Carb Modified Fluid consistency: Thin; Room service appropriate? Yes  Diet effective now             EDUCATION NEEDS:   No education needs have been identified at this time  Skin:  Skin Assessment: Reviewed RN Assessment  Last BM:  9/11  Height:   Ht Readings from Last 1 Encounters:  10/04/2017 6\' 4"  (1.93 m)   Weight:   Wt Readings from Last 1 Encounters:  10/14/17 113.4 kg   Ideal Body Weight:  91.8 kg  BMI:  Body mass index is 30.43 kg/m.  Estimated Nutritional Needs:   Kcal:  2100-2300  Protein:  120-135  gm  Fluid:  2.1-2.3 L  Arthur Holms, RD, LDN Pager #: [phone] After-Hours Pager #: [phone]

## 2017-10-14 NOTE — Progress Notes (Signed)
Received report from off going nurse at 2300. Patient stable, will continue to monitor.  Computer system off line from 0000 to 6am. Dr. Benson Norway call at [facility] about status of CT scan that was ordered at 10am STAT yesterday.   I immediately called CT (6:15am) to find out status and they reported calling unit at 21:28 ([phone] phone log) and no answer but left message with someone, before coming for patient. They supposedly are in route for patient.

## 2017-10-15 LAB — COMPREHENSIVE METABOLIC PANEL
ALBUMIN: 3 g/dL — AB (ref 3.5–5.0)
ALK PHOS: 49 U/L (ref 38–126)
ALT: 57 U/L — AB (ref 0–44)
AST: 57 U/L — AB (ref 15–41)
Anion gap: 12 (ref 5–15)
BUN: 33 mg/dL — ABNORMAL HIGH (ref 8–23)
CALCIUM: 8.4 mg/dL — AB (ref 8.9–10.3)
CO2: 19 mmol/L — AB (ref 22–32)
CREATININE: 0.96 mg/dL (ref 0.61–1.24)
Chloride: 98 mmol/L (ref 98–111)
Glucose, Bld: 138 mg/dL — ABNORMAL HIGH (ref 70–99)
Potassium: 4.8 mmol/L (ref 3.5–5.1)
Sodium: 129 mmol/L — ABNORMAL LOW (ref 135–145)
Total Bilirubin: 1 mg/dL (ref 0.3–1.2)
Total Protein: 5.2 g/dL — ABNORMAL LOW (ref 6.5–8.1)

## 2017-10-15 LAB — CBC
HCT: 40.4 % (ref 39.0–52.0)
Hemoglobin: 13.3 g/dL (ref 13.0–17.0)
MCH: 29.1 pg (ref 26.0–34.0)
MCHC: 32.9 g/dL (ref 30.0–36.0)
MCV: 88.4 fL (ref 78.0–100.0)
Platelets: 278 10*3/uL (ref 150–400)
RBC: 4.57 MIL/uL (ref 4.22–5.81)
RDW: 14.4 % (ref 11.5–15.5)
WBC: 11.5 10*3/uL — AB (ref 4.0–10.5)

## 2017-10-15 LAB — GLUCOSE, CAPILLARY
Glucose-Capillary: 146 mg/dL — ABNORMAL HIGH (ref 70–99)
Glucose-Capillary: 200 mg/dL — ABNORMAL HIGH (ref 70–99)
Glucose-Capillary: 205 mg/dL — ABNORMAL HIGH (ref 70–99)
Glucose-Capillary: 222 mg/dL — ABNORMAL HIGH (ref 70–99)

## 2017-10-15 LAB — MAGNESIUM: Magnesium: 2 mg/dL (ref 1.7–2.4)

## 2017-10-15 MED ORDER — SPIRONOLACTONE 25 MG PO TABS
50.0000 mg | ORAL_TABLET | Freq: Every day | ORAL | Status: DC
  Administered 2017-10-15: 50 mg via ORAL
  Filled 2017-10-15 (×3): qty 2

## 2017-10-15 MED ORDER — FUROSEMIDE 10 MG/ML IJ SOLN
40.0000 mg | Freq: Two times a day (BID) | INTRAMUSCULAR | Status: DC
  Administered 2017-10-15 – 2017-10-16 (×2): 40 mg via INTRAVENOUS
  Filled 2017-10-15 (×3): qty 4

## 2017-10-15 MED ORDER — MIDODRINE HCL 5 MG PO TABS
5.0000 mg | ORAL_TABLET | Freq: Three times a day (TID) | ORAL | Status: DC
  Administered 2017-10-15 – 2017-10-17 (×5): 5 mg via ORAL
  Filled 2017-10-15 (×6): qty 1

## 2017-10-15 NOTE — Progress Notes (Signed)
Subjective: ABM is starting to distend again.  Objective: Vital signs in last 24 hours: Temp:  [97.6 F (36.4 C)-98 F (36.7 C)] 98 F (36.7 C) (09/13 1153) Pulse Rate:  [102-124] 102 (09/13 1153) Resp:  [18-24] 20 (09/13 1153) BP: (95-121)/(67-71) 121/71 (09/13 1153) SpO2:  [92 %-99 %] 97 % (09/13 1153) Weight:  [120 kg] 120 kg (09/13 0544) Last BM Date: 10/15/17  Intake/Output from previous day: 09/12 0701 - 09/13 0700 In: 447.1 [P.O.:120; I.V.:327.1] Out: 500 [Urine:500] Intake/Output this shift: Total I/O In: 240 [P.O.:240] Out: 175 [Urine:175]  General appearance: alert and no distress GI: soft, but more distended  Lab Results: Recent Labs    10/13/17 0315 10/14/17 0340 10/15/17 0333  WBC 13.8* 12.7* 11.5*  HGB 13.4 12.6* 13.3  HCT 40.5 37.6* 40.4  PLT 364 312 278   BMET Recent Labs    10/13/17 0315 10/14/17 0340 10/15/17 0333  NA 127* 127* 129*  K 4.6 4.7 4.8  CL 93* 94* 98  CO2 21* 22 19*  GLUCOSE 206* 158* 138*  BUN 37* 41* 33*  CREATININE 1.13 1.04 0.96  CALCIUM 9.1 8.6* 8.4*   LFT Recent Labs    10/15/17 0333  PROT 5.2*  ALBUMIN 3.0*  AST 57*  ALT 57*  ALKPHOS 49  BILITOT 1.0   PT/INR No results for input(s): LABPROT, INR in the last 72 hours. Hepatitis Panel No results for input(s): HEPBSAG, HCVAB, HEPAIGM, HEPBIGM in the last 72 hours. C-Diff No results for input(s): CDIFFTOX in the last 72 hours. Fecal Lactopherrin No results for input(s): FECLLACTOFRN in the last 72 hours.  Studies/Results: Ct Abdomen Pelvis Wo Contrast  Result Date: 10/14/2017 CLINICAL DATA:  Abdominal distention, nausea, vomiting EXAM: CT ABDOMEN AND PELVIS WITHOUT CONTRAST TECHNIQUE: Multidetector CT imaging of the abdomen and pelvis was performed following the standard protocol without IV contrast. COMPARISON:  10/21/2017 FINDINGS: Lower chest: Small bilateral pleural effusions, right greater than left, similar to prior study. Compressive atelectasis in  the lower lobes. Heart is normal size. Densely calcified coronary arteries. Hepatobiliary: Nodular contours compatible with cirrhosis. No visible focal hepatic abnormality. Gallbladder grossly unremarkable. Pancreas: No focal abnormality or ductal dilatation. Spleen: No focal abnormality.  Normal size. Adrenals/Urinary Tract: Small low-density nodule in the lateral limb of the left adrenal gland, 14 mm, likely adenoma. No suspicious adrenal mass. Exophytic low-density areas off the kidneys bilaterally most compatible with cysts. No hydronephrosis. Urinary bladder grossly unremarkable. Stomach/Bowel: Stomach, large and small bowel grossly unremarkable. Vascular/Lymphatic: Aortic atherosclerosis. No enlarged abdominal or pelvic lymph nodes. Reproductive: No visible focal abnormality. Other: Moderate to large volume ascites in the abdomen and pelvis. There is stranding/thickening noted in the omentum anteriorly best seen within the pelvis. 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 Musculoskeletal: No acute bony abnormality. 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 Differential considerations remain the same including lymphoma, GI stromal tumor, primary duodenal carcinoma, carcinoid, or atypical pancreatic cancer. There is associated moderate to large volume ascites and omental thickening concerning for peritoneal involvement. Cirrhosis. Small bilateral pleural effusions with compressive atelectasis in the lower lobes, stable. Densely calcified coronary arteries diffusely. Aortic atherosclerosis. Electronically Signed   By: Rolm Baptise M.D.   On: 10/14/2017 07:41   Ir Paracentesis  Result Date: 10/14/2017 INDICATION: 67 year old male with a history of recurrent ascites EXAM: ULTRASOUND GUIDED  PARACENTESIS MEDICATIONS:  None. COMPLICATIONS: None PROCEDURE:  Informed written consent was obtained from the patient after a discussion of the risks, benefits and alternatives to treatment. A timeout was performed prior to the initiation of the procedure. Initial ultrasound scanning demonstrates a small amount of ascites within the right lower abdominal quadrant. The right lower abdomen was prepped and draped in the usual sterile fashion. 1% lidocaine with epinephrine was used for local anesthesia. Following this, a 8 Fr Safe-T-Centesis catheter was introduced. An ultrasound image was saved for documentation purposes. The paracentesis was performed. The catheter was removed and a dressing was applied. The patient tolerated the procedure well without immediate post procedural complication. FINDINGS: A total of approximately 6,400 of blood tinged fluid was removed. IMPRESSION: Status post ultrasound-guided paracentesis. Electronically Signed   By: Corrie Mckusick D.O.   On: 10/14/2017 12:57    Medications:  Scheduled: . aspirin EC  81 mg Oral Daily  . atorvastatin  40 mg Oral q1800  . buPROPion  300 mg Oral Daily  . feeding supplement (GLUCERNA SHAKE)  237 mL Oral BID BM  . feeding supplement (PRO-STAT SUGAR FREE 64)  30 mL Oral BID  . heparin injection (subcutaneous)  5,000 Units Subcutaneous Q8H  . insulin aspart  0-15 Units Subcutaneous TID WC  . insulin aspart  4 Units Subcutaneous TID WC  . insulin detemir  14 Units Subcutaneous QHS  . ipratropium-albuterol  3 mL Nebulization TID  . metoprolol tartrate  12.5 mg Oral BID  . naloxegol oxalate  12.5 mg Oral Daily  . polyethylene glycol  17 g Oral BID  . prasugrel  10 mg Oral Daily  . psyllium  1 packet Oral BID  . senna-docusate  1 tablet Oral BID  . spironolactone  50 mg Oral Daily  . 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 2) Cirrhosis. 3) S/p STEMI.   The patient's abdomen is starting to be tight again.  It was much softer yesterday.   The initial cytology only reported atypical cells and a repeat cytology from yesterday's procedure is pending.  If this is all from portal HTN, he can respond very well to the use of diuretics.  The patient reports that his blood pressure has always been low.  The abdominal distension may be the persistent source of his tachycardia as he has hypoventilation.  I discussed the issues with Dr. Thereasa Solo and I am awaiting a discussion with Dr. Einar Gip.  Dr. Thereasa Solo is not opposed to starting on Step I diuretics.  This can worsen his hyponatremia, but it could also improve the situation as he is fluid overloaded.  He demonstrated lower extremity edema, which was not present at the time of admission.  Plan: 1) Step I diuretics if okay with other services. 2) Await repeat cytology.  LOS: 10 days   Anique Beckley D 10/15/2017, 1:15 PM

## 2017-10-15 NOTE — Evaluation (Signed)
Physical Therapy Evaluation Patient Details Name: Daniel Rivers MRN: [id number] DOB: [birth year] Today's Date: 10/15/2017   History of Present Illness  Pt is a 67 y.o. male admitted 10/29/2017 for a STEMI requiring a stent placement. Subsequently developed abdominal pain and distention and was found to have ascites, GI was consulted. Pt with ascites s/p paracentesis 9/6 and 9/12 (removed 6.4 liters). PMH includes ankylosing spondylitis, anxiety, chronic back pain, CAD, obesity, nonalcoholic cirrhosis, DM2.  Clinical Impression  Pt admitted with above diagnosis and presents to PT with functional limitations due to deficits listed below (See PT problem list). Pt needs skilled PT to maximize independence and safety to allow discharge to home with support of grandson who pt says is planning to come stay with him after dc for several weeks. Pt's mobility and activity will be limited by continued fluid accumulation.     Follow Up Recommendations Home health PT;Supervision/Assistance - 24 hour    Equipment Recommendations  Rolling walker with 5" wheels(if pt can't locate his )    Recommendations for Other Services       Precautions / Restrictions Precautions Precautions: Fall Restrictions Weight Bearing Restrictions: No      Mobility  Bed Mobility Overal bed mobility: Needs Assistance Bed Mobility: Supine to Sit;Sit to Supine     Supine to sit: Min assist;HOB elevated Sit to supine: Min assist   General bed mobility comments: assist to elevate trunk into sitting and assist to bring legs back up into bed  Transfers Overall transfer level: Needs assistance Equipment used: Rolling walker (2 wheeled) Transfers: Sit to/from Stand Sit to Stand: Min assist         General transfer comment: Assist to bring hips up and for balance  Ambulation/Gait Ambulation/Gait assistance: Min assist Gait Distance (Feet): 175 Feet Assistive device: Rolling walker (2 wheeled) Gait  Pattern/deviations: Step-through pattern;Decreased step length - right;Decreased step length - left;Trunk flexed Gait velocity: decr Gait velocity interpretation: <1.31 ft/sec, indicative of household ambulator General Gait Details: assist for balance and support. Pt with heavy reliance on UE's. Verbal cues to stop, stand more erect, and get closer to the walker to reduce pressure on UE's. Pt amb on 2L with dyspnea 3/4 and SpO2 100%  Stairs            Wheelchair Mobility    Modified Rankin (Stroke Patients Only)       Balance Overall balance assessment: Needs assistance Sitting-balance support: No upper extremity supported;Feet supported Sitting balance-Leahy Scale: Fair     Standing balance support: Bilateral upper extremity supported Standing balance-Leahy Scale: Poor Standing balance comment: walker and min guard for static standing                             Pertinent Vitals/Pain Pain Assessment: No/denies pain    Home Living Family/patient expects to be discharged to:: Private residence Living Arrangements: Alone Available Help at Discharge: Family;Available 24 hours/day(grandson to come stay with pt for several weeks after dc) Type of Home: House Home Access: Level entry     Home Layout: One level Home Equipment: Cane - single point;Walker - 2 wheels Additional Comments: pt not sure where his walker is    Prior Function Level of Independence: Independent with assistive device(s)         Comments: uses cane     Hand Dominance        Extremity/Trunk Assessment   Upper Extremity Assessment Upper Extremity Assessment:  Defer to OT evaluation    Lower Extremity Assessment Lower Extremity Assessment: Generalized weakness       Communication   Communication: No difficulties  Cognition Arousal/Alertness: Awake/alert Behavior During Therapy: WFL for tasks assessed/performed Overall Cognitive Status: Within Functional Limits for tasks  assessed                                        General Comments      Exercises     Assessment/Plan    PT Assessment Patient needs continued PT services  PT Problem List Decreased strength;Decreased activity tolerance;Decreased balance;Decreased mobility       PT Treatment Interventions DME instruction;Gait training;Functional mobility training;Therapeutic activities;Therapeutic exercise;Balance training;Patient/family education    PT Goals (Current goals can be found in the Care Plan section)  Acute Rehab PT Goals Patient Stated Goal: return home PT Goal Formulation: With patient Time For Goal Achievement: 10/29/17 Potential to Achieve Goals: Good    Frequency Min 3X/week   Barriers to discharge Decreased caregiver support Lives alone but says grandson is coming to stay with him for several weeks after dc    Co-evaluation               AM-PAC PT "6 Clicks" Daily Activity  Outcome Measure Difficulty turning over in bed (including adjusting bedclothes, sheets and blankets)?: A Little Difficulty moving from lying on back to sitting on the side of the bed? : Unable Difficulty sitting down on and standing up from a chair with arms (e.g., wheelchair, bedside commode, etc,.)?: Unable Help needed moving to and from a bed to chair (including a wheelchair)?: A Little Help needed walking in hospital room?: A Little Help needed climbing 3-5 steps with a railing? : A Lot 6 Click Score: 13    End of Session Equipment Utilized During Treatment: Gait belt;Oxygen Activity Tolerance: Patient limited by fatigue Patient left: in bed;with call bell/phone within reach Nurse Communication: Mobility status PT Visit Diagnosis: Unsteadiness on feet (R26.81);Muscle weakness (generalized) (M62.81);Other abnormalities of gait and mobility (R26.89)    Time: 7371-0626 PT Time Calculation (min) (ACUTE ONLY): 22 min   Charges:   PT Evaluation $PT Eval Moderate  Complexity: 1 [place] Pager [phone] Office [place] 10/15/2017, 1:40 PM

## 2017-10-15 NOTE — Progress Notes (Signed)
Patient resting this morning. Noted to have undergone large volume paracentesis. Continue management as per GI recommendations. Please contact, if any questions. Dr. Einar Gip will be covering this weekend.   Nigel Mormon, MD [facility] [place] Pager: [phone] Office: [phone] If no answer Cell [phone]

## 2017-10-15 NOTE — Progress Notes (Signed)
CARDIAC REHAB PHASE I   PRE:  Rate/Rhythm: 110 ST  BP:  Supine: 98/62  Sitting:   Standing:    SaO2: 95% 2L  MODE:  Ambulation: 0   1400-1415 Came to see pt to walk. PT walked with pt earlier this morning. Pt stated he is too tired to walk at this time. Encouraged him to walk with staff later. Will continue to follow.         Graylon Good, RN BSN  10/15/2017 2:10 PM

## 2017-10-15 NOTE — Progress Notes (Signed)
[facility] TEAM 1 - Stepdown/ICU TEAM  Daniel Rivers  HMC:[id number] DOB: 1950-10-15 DOA: 10/29/2017 PCP: [name], MD    Brief Narrative:  67 year old M w/ a hx of ankylosing spondylitis, anxiety, BPH, chronic back pain, CAD, GERD, dyslipidemia, obesity, nonalcoholic cirrhosis, hemochromatosis, and DM2 who was admitted by Cardiology on 10/17/2017 for a STEMI requiring a stent placement. Subsequently he developed abdominal pain and distention and was found to have ascites, GI was consulted. The patient was transferred to the [facility] service on 10/09/2017 for management of his medical issues.  The patient has recurrent ascites, he is hypotensive and tachycardic.  Significant Events: 10/24/2017 - Left heart catheterization with LAD DES placement  10/04/2017 - TTE -  EF 35% to 40%.Severe hypokinesis of the anteroseptal and apical myocardium - grade 2 diastolic dysfunction 07/09/27 [place] - Cirrhosis of the liver - small amount of abdominal ascites - 1.4 cm hypoechoic lesion upper pole left kidney, complex cystic versus small solid nodule 10/28/2017  CTchest  No pulmonary emboli 10/08/17 TTE - mild LVH - EF 45% - akinesis of the apical septal wall, the apicalanterior wall, and the true apex 10/08/17 -  Ultrasound-guided paracentesis by IR yielding 1 L of fluid.  Neutrophil count close to 200, SAAG score cannot be calculated as there was no serum albumin from that day. 10/08/17 CT potential mass lesion in the central mesentery positioned between the SMV and second portion duodenum. Consider endoscopic ultrasound (EUS) evaluation and tumor sampling. FDG PET scan may be beneficial on outpatient basis.  10/11/2017 Repeat paracentesis not done due to "no ascites on [place]" 10/13/2017 CT stat ordered, study completed 10/14/2017 showing large ascites. 10/14/2017 repeat paracentesis again   Subjective: Denies acute issues at present, but notes that his abdom is rapidly enlarging again after paracentesis yesterday.  Some mild sob, no cp, no HA.        Assessment & Plan:  STEMI caused by LAD lesion requiring DES placement this admission - on dual antiplatelet therapy - asymptomatic at this time - this was the initial reason for his hospitalization   Abdom distention - abdom compartment syndrome - Acute hypoxic resp failure - Hemochromatosis induced cirrhosis w/ ascites had a therapeutic and diagnostic paracentesis 9/6 with 1 L of fluid removed, neutrophil count around 200 - was to have repeat paracentesis 9/9 but erroneously "no fluid was found on [place]" - CT abdom confirmed large volume ascites - now s/p repeat large volume paracentesis - is rapidly reaccumulating - begin diuretics today  Potential abdominal mass For outpt f/u - Dr Benson Norway aware and following   Hypotension, tachycardia severely intravascularly dehydrated, but complicated by marked ascites - care w/ diuretic/fluids - volume balance will be quite difficult - presently remains intravascularly dry to exam - attempt to avoid hydration unless renal fxn begins to suffer - add midodrine and follow   Hyponatremia  Due to above - follow in serial fashion   Moderate to severe protein calorie malnutrition Cont Protostat  Dyslipidemia On statin  Left upper pole kidney cyst Outpatient PCP follow-up  DM2 CBG variable - follow w/o change in tx today   MRSA screen +  DVT prophylaxis: [place] heparin  Code Status: FULL CODE Family Communication: no family present at time of exam  Disposition Plan: SDU  Consultants:  GI PCCM Cardiology   Antimicrobials:  Rocephin 9/6 > 9/8  Objective: Blood pressure 98/62, pulse (!) 109, temperature 98 F (36.7 C), temperature source Oral, resp. rate 20, height 6\' 4"  (1.93  m), weight 120 kg, SpO2 96 %.  Intake/Output Summary (Last 24 hours) at 10/15/2017 1452 Last data filed at 10/15/2017 1000 Gross per 24 hour  Intake 567.08 ml  Output 475 ml  Net 92.08 ml   Filed Weights   10/13/17 0102 10/14/17 0615 10/15/17 0544  Weight:  124.5 kg 113.4 kg 120 kg    Examination: General: mild SOB appreciated - no acute distress Lungs: CTA B  Cardiovascular: tachycardic - regular rhythm - no M  Abdomen: remains modestly distended, no rebound, taught, BS hypoactive, no mass Extremities: 1+ B LE edema   CBC: Recent Labs  Lab 10/13/17 0315 10/14/17 0340 10/15/17 0333  WBC 13.8* 12.7* 11.5*  HGB 13.4 12.6* 13.3  HCT 40.5 37.6* 40.4  MCV 87.1 87.6 88.4  PLT 364 312 614   Basic Metabolic Panel: Recent Labs  Lab 10/12/17 0357 10/13/17 0315 10/14/17 0340 10/15/17 0333  NA 129* 127* 127* 129*  K 4.5 4.6 4.7 4.8  CL 96* 93* 94* 98  CO2 21* 21* 22 19*  GLUCOSE 200* 206* 158* 138*  BUN 31* 37* 41* 33*  CREATININE 1.12 1.13 1.04 0.96  CALCIUM 8.6* 9.1 8.6* 8.4*  MG 2.0 2.1 2.1 2.0  PHOS 4.2 4.6  --   --    GFR: Estimated Creatinine Clearance: 105.7 mL/min (by C-G formula based on SCr of 0.96 mg/dL).  Liver Function Tests: Recent Labs  Lab 10/12/17 0357 10/13/17 0315 10/14/17 0340 10/15/17 0333  AST 72* 80* 81* 57*  ALT 80* 72* 76* 57*  ALKPHOS 57 61 57 49  BILITOT 0.8 1.5* 1.4* 1.0  PROT 6.0* 6.0* 5.7* 5.2*  ALBUMIN 3.3* 3.2* 2.9* 3.0*    Coagulation Profile: No results for input(s): INR, PROTIME in the last 168 hours.  HbA1C: Hgb A1c MFr Bld  Date/Time Value Ref Range Status  10/06/2017 03:31 AM 6.9 (H) 4.8 - 5.6 % Final    Comment:    (NOTE) Pre diabetes:          5.7%-6.4% Diabetes:              >6.4% Glycemic control for   <7.0% adults with diabetes   05/22/2014 03:10 PM 6.5 (H) 4.8 - 5.6 % Final    Comment:    (NOTE)         Pre-diabetes: 5.7 - 6.4         Diabetes: >6.4         Glycemic control for adults with diabetes: <7.0     CBG: Recent Labs  Lab 10/14/17 1306 10/14/17 1604 10/14/17 2108 10/15/17 0617 10/15/17 1150  GLUCAP 163* 140* 99 146* 200*    Recent Results (from the past 240 hour(s))  MRSA PCR Screening     Status: Abnormal   Collection Time: 10/21/2017   3:45 PM  Result Value Ref Range Status   MRSA by PCR POSITIVE (A) NEGATIVE Final    Comment:        The GeneXpert MRSA Assay (FDA approved for NASAL specimens only), is one component of a comprehensive MRSA colonization surveillance program. It is not intended to diagnose MRSA infection nor to guide or monitor treatment for MRSA infections. RESULT CALLED TO, READ BACK BY AND VERIFIED WITH: Angelica Chessman RN 17:20 10/12/2017 ([email or web address]) Performed at [facility], [place] [address], [place], [place] [address]   Stat Gram stain     Status: None   Collection Time: 10/08/17  7:31 PM  Result Value Ref Range Status  Specimen Description PERITONEAL  Final   Special Requests NONE  Final   Gram Stain   Final    FEW WBC PRESENT,BOTH PMN AND MONONUCLEAR NO ORGANISMS SEEN Performed at [facility], [address], [place], [place] [address]    Report Status 10/08/2017 FINAL  Final  Culture, body fluid-bottle     Status: None   Collection Time: 10/08/17  7:31 PM  Result Value Ref Range Status   Specimen Description PERITONEAL  Final   Special Requests NONE  Final   Culture   Final    NO GROWTH 5 DAYS Performed at [place] [address], [place], [place] [address]    Report Status 10/13/2017 FINAL  Final  Culture, Urine     Status: None   Collection Time: 10/09/17  7:47 PM  Result Value Ref Range Status   Specimen Description URINE, RANDOM  Final   Special Requests NONE  Final   Culture   Final    NO GROWTH Performed at [facility], [place] [address], [place], [place] [address]    Report Status 10/10/2017 FINAL  Final  Gram stain     Status: None   Collection Time: 10/13/17 12:44 PM  Result Value Ref Range Status   Specimen Description PLEURAL RIGHT  Final   Special Requests NONE  Final   Gram Stain   Final    RARE WBC PRESENT, PREDOMINANTLY PMN NO ORGANISMS SEEN Performed at [facility], [place] [address], [place], [place] [address]    Report Status  10/13/2017 FINAL  Final  Culture, body fluid-bottle     Status: None (Preliminary result)   Collection Time: 10/13/17 12:44 PM  Result Value Ref Range Status   Specimen Description PLEURAL RIGHT  Final   Special Requests NONE  Final   Culture   Final    NO GROWTH 2 DAYS Performed at [facility] [address] [place], [place] [address]    Report Status PENDING  Incomplete     Scheduled Meds: . aspirin EC  81 mg Oral Daily  . atorvastatin  40 mg Oral q1800  . buPROPion  300 mg Oral Daily  . feeding supplement (GLUCERNA SHAKE)  237 mL Oral BID BM  . feeding supplement (PRO-STAT SUGAR FREE 64)  30 mL Oral BID  . furosemide  40 mg Intravenous Q12H  . heparin injection (subcutaneous)  5,000 Units Subcutaneous Q8H  . insulin aspart  0-15 Units Subcutaneous TID WC  . insulin aspart  4 Units Subcutaneous TID WC  . insulin detemir  14 Units Subcutaneous QHS  . ipratropium-albuterol  3 mL Nebulization TID  . metoprolol tartrate  12.5 mg Oral BID  . naloxegol oxalate  12.5 mg Oral Daily  . polyethylene glycol  17 g Oral BID  . prasugrel  10 mg Oral Daily  . psyllium  1 packet Oral BID  . senna-docusate  1 tablet Oral BID  . spironolactone  50 mg Oral Daily  . zolpidem  5 mg Oral QHS     LOS: 10 days   Cherene Altes, MD [facility] Office  [phone] Pager - Text Page per Amion  If 7PM-7AM, please contact night-coverage per Amion 10/15/2017, 2:52 PM

## 2017-10-16 ENCOUNTER — Inpatient Hospital Stay (HOSPITAL_COMMUNITY): Payer: Medicare Other

## 2017-10-16 DIAGNOSIS — K7460 Unspecified cirrhosis of liver: Secondary | ICD-10-CM

## 2017-10-16 DIAGNOSIS — R188 Other ascites: Secondary | ICD-10-CM

## 2017-10-16 DIAGNOSIS — K7469 Other cirrhosis of liver: Secondary | ICD-10-CM

## 2017-10-16 LAB — CBC
HCT: 42.5 % (ref 39.0–52.0)
Hemoglobin: 13.8 g/dL (ref 13.0–17.0)
MCH: 29.1 pg (ref 26.0–34.0)
MCHC: 32.5 g/dL (ref 30.0–36.0)
MCV: 89.5 fL (ref 78.0–100.0)
PLATELETS: 390 10*3/uL (ref 150–400)
RBC: 4.75 MIL/uL (ref 4.22–5.81)
RDW: 14.6 % (ref 11.5–15.5)
WBC: 14.6 10*3/uL — AB (ref 4.0–10.5)

## 2017-10-16 LAB — GLUCOSE, CAPILLARY
GLUCOSE-CAPILLARY: 240 mg/dL — AB (ref 70–99)
Glucose-Capillary: 260 mg/dL — ABNORMAL HIGH (ref 70–99)
Glucose-Capillary: 254 mg/dL — ABNORMAL HIGH (ref 70–99)
Glucose-Capillary: 223 mg/dL — ABNORMAL HIGH (ref 70–99)

## 2017-10-16 LAB — COMPREHENSIVE METABOLIC PANEL
ALBUMIN: 2.8 g/dL — AB (ref 3.5–5.0)
ALT: 48 U/L — ABNORMAL HIGH (ref 0–44)
ANION GAP: 10 (ref 5–15)
AST: 42 U/L — AB (ref 15–41)
Alkaline Phosphatase: 55 U/L (ref 38–126)
BUN: 45 mg/dL — ABNORMAL HIGH (ref 8–23)
CHLORIDE: 95 mmol/L — AB (ref 98–111)
CO2: 21 mmol/L — ABNORMAL LOW (ref 22–32)
Calcium: 8.8 mg/dL — ABNORMAL LOW (ref 8.9–10.3)
Creatinine, Ser: 1.69 mg/dL — ABNORMAL HIGH (ref 0.61–1.24)
GFR calc Af Amer: 47 mL/min — ABNORMAL LOW (ref >60)
GFR, EST NON AFRICAN AMERICAN: 40 mL/min — AB (ref >60)
Glucose, Bld: 241 mg/dL — ABNORMAL HIGH (ref 70–99)
POTASSIUM: 5.4 mmol/L — AB (ref 3.5–5.1)
Sodium: 126 mmol/L — ABNORMAL LOW (ref 135–145)
Total Bilirubin: 1.1 mg/dL (ref 0.3–1.2)
Total Protein: 5.5 g/dL — ABNORMAL LOW (ref 6.5–8.1)

## 2017-10-16 LAB — PROTIME-INR
INR: 1.22
PROTHROMBIN TIME: 15.3 s — AB (ref 11.4–15.2)

## 2017-10-16 MED ORDER — LIDOCAINE HCL (PF) 1 % IJ SOLN
INTRAMUSCULAR | Status: AC
  Filled 2017-10-16: qty 30

## 2017-10-16 MED ORDER — INSULIN DETEMIR 100 UNIT/ML ~~LOC~~ SOLN
18.0000 [IU] | Freq: Every day | SUBCUTANEOUS | Status: DC
  Administered 2017-10-16 – 2017-10-17 (×2): 18 [IU] via SUBCUTANEOUS
  Filled 2017-10-16 (×2): qty 0.18

## 2017-10-16 NOTE — Progress Notes (Signed)
[place] [facility] ROUNDING NOTE   Subjective: No acute events overnight. Abdomen again distended and he endorses increasing SOB due to fluid accumulation. Started on Lasix 40 mg IV BID, Aldactone 50 mg daily, and Midodrine 5 mg TID yesterday.    Objective: Vital signs in last 24 hours: Temp:  [97.6 F (36.4 C)-98.1 F (36.7 C)] 97.8 F (36.6 C) (09/14 0921) Pulse Rate:  [98-125] 98 (09/14 0921) Resp:  [16-20] 18 (09/14 0921) BP: (94-121)/(62-78) 94/78 (09/14 0921) SpO2:  [94 %-100 %] 100 % (09/14 0921) FiO2 (%):  [28 %] 28 % (09/13 1430) Weight:  [121.4 kg] 121.4 kg (09/14 0452) Last BM Date: 10/15/17 General: NAD. [place] in place, conversive but SOB with prolonged talking Abdomen: Distended, tense but non tender, no rebound or guarding Ext: Trace LE edema in b/l LE     Intake/Output from previous day: 09/13 0701 - 09/14 0700 In: 78 [P.O.:820] Out: 175 [Urine:175] Intake/Output this shift: Total I/O In: 420 [P.O.:420] Out: -    Lab Results: Recent Labs    10/14/17 0340 10/15/17 0333 10/16/17 0653  WBC 12.7* 11.5* 14.6*  HGB 12.6* 13.3 13.8  PLT 312 278 390  MCV 87.6 88.4 89.5   BMET Recent Labs    10/14/17 0340 10/15/17 0333 10/16/17 0653  NA 127* 129* 126*  K 4.7 4.8 5.4*  CL 94* 98 95*  CO2 22 19* 21*  GLUCOSE 158* 138* 241*  BUN 41* 33* 45*  CREATININE 1.04 0.96 1.69*  CALCIUM 8.6* 8.4* 8.8*   LFT Recent Labs    10/14/17 0340 10/15/17 0333 10/16/17 0653  PROT 5.7* 5.2* 5.5*  ALBUMIN 2.9* 3.0* 2.8*  AST 81* 57* 42*  ALT 76* 57* 48*  ALKPHOS 57 49 55  BILITOT 1.4* 1.0 1.1   PT/INR Recent Labs    10/16/17 0653  INR 1.22      Imaging/Other results: Ir Paracentesis  Result Date: 10/14/2017 INDICATION: 67 year old male with a history of recurrent ascites EXAM: ULTRASOUND GUIDED  PARACENTESIS MEDICATIONS: None. COMPLICATIONS: None PROCEDURE: Informed written consent was obtained from the patient after a discussion of the risks,  benefits and alternatives to treatment. A timeout was performed prior to the initiation of the procedure. Initial ultrasound scanning demonstrates a small amount of ascites within the right lower abdominal quadrant. The right lower abdomen was prepped and draped in the usual sterile fashion. 1% lidocaine with epinephrine was used for local anesthesia. Following this, a 8 Fr Safe-T-Centesis catheter was introduced. An ultrasound image was saved for documentation purposes. The paracentesis was performed. The catheter was removed and a dressing was applied. The patient tolerated the procedure well without immediate post procedural complication. FINDINGS: A total of approximately 6,400 of blood tinged fluid was removed. IMPRESSION: Status post ultrasound-guided paracentesis. Electronically Signed   By: Corrie Mckusick D.O.   On: 10/14/2017 12:57      Assessment and Recommendations:  1) Ascites: Reviewed previous notes and paracentesis results- difficult to interpret if ascites is solely 2/2 portal HTN vs malignant etiology, with heightened concern for the latter given the CT findings of mass in/adjacent to small bowel. His fluid management has been understandably difficult to manage given the combination of overall volume overloaded state of ascites and the relative intravascular depletion. He has had clinical response to therapeutic paracentesis, with the most recent on 9/12 with 6.4L removed (cytology pending; initial cytology with atypical cells). Unfortunately, he has been largely refractory to diuretics, again owing to difficult volume management issues and recent  STEMI. Trial of appropriate diuretics yesterday unfortunately with subsequent mild worsening of hypoNa and hyperK+ with AKI (creat 1.69 today from 0.96 yesterday).    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 Benson Norway through Sunday at 1700. Please do not hesitate to contact with additional questions or concerns.   Lavena Bullion, DO  10/16/2017, 9:41 AM [facility] Pager [phone]

## 2017-10-16 NOTE — Progress Notes (Signed)
CARDIAC REHAB PHASE I   Pt to IR for paracentesis. Will f/u on Monday for ambulation.   Noel Christmas, RN 10/16/2017 10:53 AM

## 2017-10-16 NOTE — Progress Notes (Signed)
[place] [facility] TEAM 1 - Stepdown/ICU TEAM  Daniel Rivers  PXT:[id number] DOB: Jul 19, 1950 DOA: 10/04/2017 PCP: [name], MD    Brief Narrative:  67 year old M w/ a hx of ankylosing spondylitis, anxiety, BPH, chronic back pain, CAD, GERD, dyslipidemia, obesity, nonalcoholic cirrhosis, hemochromatosis, and DM2 who was admitted by Cardiology on 10/24/2017 for a STEMI requiring a stent placement. Subsequently he developed abdominal pain and distention and was found to have ascites, GI was consulted. The patient was transferred to the [facility] service on 10/09/2017 for management of his medical issues.  The patient has recurrent ascites, he is hypotensive and tachycardic.  Significant Events: 10/30/2017 - Left heart catheterization with LAD DES placement  10/04/2017 - TTE -  EF 35% to 40%.Severe hypokinesis of the anteroseptal and apical myocardium - grade 2 diastolic dysfunction 07/04/68 [place] - Cirrhosis of the liver - small amount of abdominal ascites - 1.4 cm hypoechoic lesion upper pole left kidney, complex cystic versus small solid nodule 10/24/2017  CTchest  No pulmonary emboli 10/08/17 TTE - mild LVH - EF 45% - akinesis of the apical septal wall, the apicalanterior wall, and the true apex 10/08/17 -  Ultrasound-guided paracentesis by IR yielding 1 L of fluid.  Neutrophil count close to 200, SAAG score cannot be calculated as there was no serum albumin from that day. 10/08/17 CT potential mass lesion in the central mesentery positioned between the SMV and second portion duodenum. Consider endoscopic ultrasound (EUS) evaluation and tumor sampling. FDG PET scan may be beneficial on outpatient basis.  10/11/2017 Repeat paracentesis not done due to "no ascites on [place]" 10/13/2017 CT stat ordered, study completed 10/14/2017 showing large ascites. 10/14/2017 repeat paracentesis 10/16/2017 repeat paracentesis - 5.1L  Subjective: Renal fxn worsened significantly w/ attempt to resume diuretic yesterday. Discussed w/ GI and we agreed on  repeat paracentesis today.  5.1L able to be removed in IR.        Assessment & Plan:  Abdom distention - abdom compartment syndrome - Acute hypoxic resp failure - Hemochromatosis induced cirrhosis w/ ascites had a therapeutic and diagnostic paracentesis 9/6 with 1 L of fluid removed, neutrophil count around 200 - was to have repeat paracentesis 9/9 but erroneously "no fluid was found on [place]" - CT abdom confirmed large volume ascites - now s/p repeat large volume paracentesis 9/12 and again on 9/14 - follow w/o diuretic - midodrine added - renal fxn will not allow diuretic use   Potential abdominal mass For outpt f/u - Dr Benson Norway aware and following - perhaps this is contributing to the accumulation of ascites   Hypotension, tachycardia severely intravascularly dehydrated, but complicated by marked ascites - volume balance proving very difficult - presently remains intravascularly dry to exam - cont midodrine    STEMI caused by LAD lesion requiring DES placement this admission - on dual antiplatelet therapy - asymptomatic at this time - this was the initial reason for his hospitalization   Hyponatremia  Due to above - worsened w/ attempt to diurese w/ aldactone and lasix - follow now that off diuretics   Hyperkalemia Due to AKI + aldactone - stopped alactone - follow trend   Moderate to severe protein calorie malnutrition Cont Protostat  Dyslipidemia Cont statin   Left upper pole kidney cyst Outpatient PCP follow-up  DM2 CBG consistently elevated above goal - adjust tx and follow   MRSA screen +  DVT prophylaxis: [place] heparin  Code Status: FULL CODE Family Communication: no family present at time of exam  Disposition Plan: SDU  Consultants:  GI PCCM Cardiology   Antimicrobials:  Rocephin 9/6 > 9/8  Objective: Blood pressure 94/63, pulse 98, temperature 97.8 F (36.6 C), temperature source Oral, resp. rate 16, height 6\' 4"  (1.93 m), weight 121.4 kg, SpO2 98  %.  Intake/Output Summary (Last 24 hours) at 10/16/2017 1455 Last data filed at 10/16/2017 0900 Gross per 24 hour  Intake 900 ml  Output -  Net 900 ml   Filed Weights   10/14/17 0615 10/15/17 0544 10/16/17 0452  Weight: 113.4 kg 120 kg 121.4 kg    Examination: General: much less sob after paracentesis  Lungs: mild bibasilar crackles - no wheezing  Cardiovascular: RRR - no M or rub  Abdomen: signif less distended post paracentesis, no rebound, BS hypoactive, no mass Extremities: no sigif LE edema B   CBC: Recent Labs  Lab 10/14/17 0340 10/15/17 0333 10/16/17 0653  WBC 12.7* 11.5* 14.6*  HGB 12.6* 13.3 13.8  HCT 37.6* 40.4 42.5  MCV 87.6 88.4 89.5  PLT 312 278 350   Basic Metabolic Panel: Recent Labs  Lab 10/12/17 0357 10/13/17 0315 10/14/17 0340 10/15/17 0333 10/16/17 0653  NA 129* 127* 127* 129* 126*  K 4.5 4.6 4.7 4.8 5.4*  CL 96* 93* 94* 98 95*  CO2 21* 21* 22 19* 21*  GLUCOSE 200* 206* 158* 138* 241*  BUN 31* 37* 41* 33* 45*  CREATININE 1.12 1.13 1.04 0.96 1.69*  CALCIUM 8.6* 9.1 8.6* 8.4* 8.8*  MG 2.0 2.1 2.1 2.0  --   PHOS 4.2 4.6  --   --   --    GFR: Estimated Creatinine Clearance: 60.4 mL/min (A) (by C-G formula based on SCr of 1.69 mg/dL (H)).  Liver Function Tests: Recent Labs  Lab 10/13/17 0315 10/14/17 0340 10/15/17 0333 10/16/17 0653  AST 80* 81* 57* 42*  ALT 72* 76* 57* 48*  ALKPHOS 61 57 49 55  BILITOT 1.5* 1.4* 1.0 1.1  PROT 6.0* 5.7* 5.2* 5.5*  ALBUMIN 3.2* 2.9* 3.0* 2.8*    Coagulation Profile: Recent Labs  Lab 10/16/17 0653  INR 1.22    HbA1C: Hgb A1c MFr Bld  Date/Time Value Ref Range Status  10/06/2017 03:31 AM 6.9 (H) 4.8 - 5.6 % Final    Comment:    (NOTE) Pre diabetes:          5.7%-6.4% Diabetes:              >6.4% Glycemic control for   <7.0% adults with diabetes   05/22/2014 03:10 PM 6.5 (H) 4.8 - 5.6 % Final    Comment:    (NOTE)         Pre-diabetes: 5.7 - 6.4         Diabetes: >6.4          Glycemic control for adults with diabetes: <7.0     CBG: Recent Labs  Lab 10/15/17 1150 10/15/17 1626 10/15/17 2049 10/16/17 0613 10/16/17 1221  GLUCAP 200* 205* 222* 240* 260*    Recent Results (from the past 240 hour(s))  Stat Gram stain     Status: None   Collection Time: 10/08/17  7:31 PM  Result Value Ref Range Status   Specimen Description PERITONEAL  Final   Special Requests NONE  Final   Gram Stain   Final    FEW WBC PRESENT,BOTH PMN AND MONONUCLEAR NO ORGANISMS SEEN Performed at [facility], [place] [address] [place], [place] [address]    Report Status  10/08/2017 FINAL  Final  Culture, body fluid-bottle     Status: None   Collection Time: 10/08/17  7:31 PM  Result Value Ref Range Status   Specimen Description PERITONEAL  Final   Special Requests NONE  Final   Culture   Final    NO GROWTH 5 DAYS Performed at [facility], [address], [place], [place] [address]    Report Status 10/13/2017 FINAL  Final  Culture, Urine     Status: None   Collection Time: 10/09/17  7:47 PM  Result Value Ref Range Status   Specimen Description URINE, RANDOM  Final   Special Requests NONE  Final   Culture   Final    NO GROWTH Performed at [facility], [place] [address], [place], [place] [address]    Report Status 10/10/2017 FINAL  Final  Gram stain     Status: None   Collection Time: 10/13/17 12:44 PM  Result Value Ref Range Status   Specimen Description PLEURAL RIGHT  Final   Special Requests NONE  Final   Gram Stain   Final    RARE WBC PRESENT, PREDOMINANTLY PMN NO ORGANISMS SEEN Performed at [facility], [place] [address] [place], [place] [address]    Report Status 10/13/2017 FINAL  Final  Culture, body fluid-bottle     Status: None (Preliminary result)   Collection Time: 10/13/17 12:44 PM  Result Value Ref Range Status   Specimen Description PLEURAL RIGHT  Final   Special Requests NONE  Final   Culture   Final    NO GROWTH 3 DAYS Performed  at [facility] [address], [place], [place] [address]    Report Status PENDING  Incomplete     Scheduled Meds: . aspirin EC  81 mg Oral Daily  . atorvastatin  40 mg Oral q1800  . buPROPion  300 mg Oral Daily  . feeding supplement (GLUCERNA SHAKE)  237 mL Oral BID BM  . feeding supplement (PRO-STAT SUGAR FREE 64)  30 mL Oral BID  . furosemide  40 mg Intravenous Q12H  . heparin injection (subcutaneous)  5,000 Units Subcutaneous Q8H  . insulin aspart  0-15 Units Subcutaneous TID WC  . insulin aspart  4 Units Subcutaneous TID WC  . insulin detemir  14 Units Subcutaneous QHS  . ipratropium-albuterol  3 mL Nebulization TID  . metoprolol tartrate  12.5 mg Oral BID  . midodrine  5 mg Oral TID WC  . naloxegol oxalate  12.5 mg Oral Daily  . polyethylene glycol  17 g Oral BID  . prasugrel  10 mg Oral Daily  . psyllium  1 packet Oral BID  . senna-docusate  1 tablet Oral BID  . spironolactone  50 mg Oral Daily  . zolpidem  5 mg Oral QHS     LOS: 11 days   Cherene Altes, MD [facility] Office  [phone] Pager - Text Page per Amion  If 7PM-7AM, please contact night-coverage per Amion 10/16/2017, 2:55 PM

## 2017-10-16 NOTE — Procedures (Signed)
PROCEDURE SUMMARY:  Successful image-guided paracentesis from the right lower abdomen.  Yielded 5.1 liters of blood tinged fluid.  No immediate complications.  Patient tolerated well.   Specimen was sent for labs.  Claris Pong Louk PA-C 10/16/2017 12:03 PM

## 2017-10-17 LAB — COMPREHENSIVE METABOLIC PANEL
ALK PHOS: 59 U/L (ref 38–126)
ALT: 49 U/L — AB (ref 0–44)
AST: 54 U/L — AB (ref 15–41)
Albumin: 2.7 g/dL — ABNORMAL LOW (ref 3.5–5.0)
Anion gap: 14 (ref 5–15)
BILIRUBIN TOTAL: 1.1 mg/dL (ref 0.3–1.2)
BUN: 57 mg/dL — AB (ref 8–23)
CALCIUM: 9.1 mg/dL (ref 8.9–10.3)
CO2: 20 mmol/L — ABNORMAL LOW (ref 22–32)
CREATININE: 1.77 mg/dL — AB (ref 0.61–1.24)
Chloride: 90 mmol/L — ABNORMAL LOW (ref 98–111)
GFR, EST AFRICAN AMERICAN: 44 mL/min — AB (ref >60)
GFR, EST NON AFRICAN AMERICAN: 38 mL/min — AB (ref >60)
Glucose, Bld: 246 mg/dL — ABNORMAL HIGH (ref 70–99)
Potassium: 5.1 mmol/L (ref 3.5–5.1)
Sodium: 124 mmol/L — ABNORMAL LOW (ref 135–145)
Total Protein: 5.2 g/dL — ABNORMAL LOW (ref 6.5–8.1)

## 2017-10-17 LAB — CBC
HEMATOCRIT: 42.3 % (ref 39.0–52.0)
HEMOGLOBIN: 14.1 g/dL (ref 13.0–17.0)
MCH: 29.2 pg (ref 26.0–34.0)
MCHC: 33.3 g/dL (ref 30.0–36.0)
MCV: 87.6 fL (ref 78.0–100.0)
Platelets: 393 10*3/uL (ref 150–400)
RBC: 4.83 MIL/uL (ref 4.22–5.81)
RDW: 14.3 % (ref 11.5–15.5)
WBC: 16 10*3/uL — AB (ref 4.0–10.5)

## 2017-10-17 LAB — GLUCOSE, CAPILLARY
Glucose-Capillary: 238 mg/dL — ABNORMAL HIGH (ref 70–99)
Glucose-Capillary: 223 mg/dL — ABNORMAL HIGH (ref 70–99)
Glucose-Capillary: 238 mg/dL — ABNORMAL HIGH (ref 70–99)
Glucose-Capillary: 196 mg/dL — ABNORMAL HIGH (ref 70–99)

## 2017-10-17 MED ORDER — INSULIN ASPART 100 UNIT/ML ~~LOC~~ SOLN
6.0000 [IU] | Freq: Three times a day (TID) | SUBCUTANEOUS | Status: DC
  Administered 2017-10-18: 6 [IU] via SUBCUTANEOUS

## 2017-10-17 MED ORDER — MIDODRINE HCL 5 MG PO TABS
10.0000 mg | ORAL_TABLET | Freq: Three times a day (TID) | ORAL | Status: DC
  Filled 2017-10-17: qty 2

## 2017-10-17 MED ORDER — ALBUMIN HUMAN 25 % IV SOLN
25.0000 g | Freq: Once | INTRAVENOUS | Status: AC
  Administered 2017-10-17: 25 g via INTRAVENOUS
  Filled 2017-10-17: qty 100

## 2017-10-17 MED ORDER — IPRATROPIUM-ALBUTEROL 0.5-2.5 (3) MG/3ML IN SOLN
3.0000 mL | Freq: Three times a day (TID) | RESPIRATORY_TRACT | Status: DC | PRN
  Administered 2017-10-20 – 2017-10-22 (×4): 3 mL via RESPIRATORY_TRACT
  Filled 2017-10-17 (×4): qty 3

## 2017-10-17 NOTE — Progress Notes (Addendum)
[facility] Progress Note cross-covering for Dr. Collene Mares and Dr. Benson Norway  CC:  Ascites, cirrhosis, abdominal mass  Subjective:  S/p paracentesis with 5.1 Liters removed.  Stopped Lasix and Aldactone yesterday.  Cr up still more today at 1.77. K+ improved to 5.1. no new complaints or acute events.  Objective:  Vital signs in last 24 hours: Temp:  [97.7 F (36.5 C)] 97.7 F (36.5 C) (09/15 0348) Pulse Rate:  [98-110] 109 (09/15 0915) Resp:  [13-22] 20 (09/15 0915) BP: (85-112)/(50-77) 97/50 (09/15 0915) SpO2:  [93 %-100 %] 99 % (09/15 0915) Weight:  [117.6 kg] 117.6 kg (09/15 0348) Last BM Date: 10/16/17 General:  Alert, Well-developed, in NAD Heart:  Tachy; no murmurs Pulm:  CTAB.  No increased WOB. Abdomen:  Softer today but still quite distended from ascites fluid.  BS present.  Non-tender. Extremities:  1+ pitting edema in B/L LE's Neurologic:  Alert and oriented x 4;  grossly normal neurologically. Psych:  Alert and cooperative. Normal mood and affect.  Intake/Output from previous day: 09/14 0701 - 09/15 0700 In: 870 [P.O.:870] Out: 150 [Urine:150] Intake/Output this shift: Total I/O In: 462 [P.O.:462] Out: -   Lab Results: Recent Labs    10/15/17 0333 10/16/17 0653 10/17/17 0430  WBC 11.5* 14.6* 16.0*  HGB 13.3 13.8 14.1  HCT 40.4 42.5 42.3  PLT 278 390 393   BMET Recent Labs    10/15/17 0333 10/16/17 0653 10/17/17 0430  NA 129* 126* 124*  K 4.8 5.4* 5.1  CL 98 95* 90*  CO2 19* 21* 20*  GLUCOSE 138* 241* 246*  BUN 33* 45* 57*  CREATININE 0.96 1.69* 1.77*  CALCIUM 8.4* 8.8* 9.1   LFT Recent Labs    10/17/17 0430  PROT 5.2*  ALBUMIN 2.7*  AST 54*  ALT 49*  ALKPHOS 59  BILITOT 1.1   PT/INR Recent Labs    10/16/17 0653  LABPROT 15.3*  INR 1.22   US Paracentesis  Result Date: 10/16/2017 INDICATION: Patient with history of NASH cirrhosis and recurrent ascites. Request is made for diagnostic and therapeutic paracentesis.  EXAM: ULTRASOUND GUIDED DIAGNOSTIC AND THERAPEUTIC PARACENTESIS MEDICATIONS: 10 mL of 2% lidocaine COMPLICATIONS: None immediate. PROCEDURE: Informed written consent was obtained from the patient after a discussion of the risks, benefits and alternatives to treatment. A timeout was performed prior to the initiation of the procedure. Initial ultrasound scanning demonstrates a moderate amount of ascites within the right lower abdominal quadrant. The right lower abdomen was prepped and draped in the usual sterile fashion. 2% lidocaine was used for local anesthesia. Following this, a 6 Fr Safe-T-Centesis catheter was introduced. An ultrasound image was saved for documentation purposes. The paracentesis was performed. The catheter was removed and a dressing was applied. The patient tolerated the procedure well without immediate post procedural complication. FINDINGS: A total of approximately 5.1 L of blood-tinged fluid was removed. Samples were sent to the laboratory as requested by the clinical team. IMPRESSION: Successful ultrasound-guided paracentesis yielding 5.1 L of peritoneal fluid. Read by: Earley Abide, PA-C Electronically Signed   By: Sandi Mariscal M.D.   On: 10/16/2017 12:06   Assessment / Plan: 1) Ascites: Reviewed previous notes and paracentesis results- difficult to interpret if ascites is solely 2/2 portal HTN vs malignant etiology, with heightened concern for the latter given the CT findings of mass in/adjacent to small bowel. His fluid management has been understandably difficult to manage given the combination of overall volume overloaded state of ascites and  the relative intravascular depletion as manifested by hypotension and tachycardia. He has had clinical response to therapeutic paracentesis, with the most recent on 9/14 with 5.1L removed.  Unfortunately, he has been largely intolerant to diuretics, again owing to difficult volume management issues and recent STEMI. Trial of appropriate  diuretics unfortunately with subsequent mild worsening of hypoNa and hyperK+ with AKI (creat 1.69 from 0.96 and now still up today at 1.77).  Lasix and aldactone discontinued.   - Will hold midodrine. - Will give 25 grams of 25% albumin. - Awaiting cytology from paracentesis yesterday.  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 At what point and how does this get evaluated.  ? EUS.  Dr. Collene Mares and Dr. Benson Norway will resume care tomorrow, 9/16.    LOS: 12 days   Daniel Rivers. Zehr  10/17/2017, 10:57 AM   Attending physician's note   I have taken an interval history, reviewed the chart and examined the patient. I agree with the Advanced Practitioner's note, impression, and recommendations as outlined.  Unfortunately, despite holding Lasix and Aldactone yesterday, he still had worsening AKI with serum creatinine 1.77 today (from 1.69 yesterday).  Improved hyperkalemia but mild worsening of hyponatremia (124 from 126).  He continues to be intolerant to diuretics, with AKI with trial of diuretics likely all due to intravascular volume depletion, despite large volume ascites.  Clinically responds well to therapeutic paracentesis, with another 5.1 L removed yesterday.  Given diuretic intolerance state, may need to consider TIPS in the future.  Not likely OLT candidate given recent acute cardiac event.  Additionally prehospital MELD was only 9 and current elevated MELD (14) primarily driven by AKI.  Would still need to hold off on exploring either of these possibilities pending cytology results.  Agree with holding Midrin in the setting of ongoing/worsening renal function, and giving albumin 25% and attempt to improve renal perfusion.  If still worsening, may need to consider nephrology consult.  Dr. Collene Mares will resume care of Mr. Loconte tomorrow.  [address] DO, FACG [phone] office
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
## 2017-10-17 NOTE — Progress Notes (Signed)
[place] TEAM 1 - Stepdown/ICU TEAM  Daniel Rivers  ZOX:[id number] DOB: January 15, 1951 DOA: 10/23/2017 PCP: [name], MD    Brief Narrative:  67 year old M w/ a hx of ankylosing spondylitis, anxiety, BPH, chronic back pain, CAD, GERD, dyslipidemia, obesity, nonalcoholic cirrhosis, hemochromatosis, and DM2 who was admitted by Cardiology on 10/19/2017 for a STEMI requiring a stent placement. Subsequently he developed abdominal pain and distention and was found to have ascites, GI was consulted. The patient was transferred to the [facility] service on 10/09/2017 for management of his medical issues.  The patient has recurrent ascites, he is hypotensive and tachycardic.  Significant Events: 10/31/2017 - Left heart catheterization with LAD DES placement  10/24/2017 - TTE -  EF 35% to 40%.Severe hypokinesis of the anteroseptal and apical myocardium - grade 2 diastolic dysfunction 09/02/17 [place] - Cirrhosis of the liver - small amount of abdominal ascites - 1.4 cm hypoechoic lesion upper pole left kidney, complex cystic versus small solid nodule 11/01/2017  CTchest  No pulmonary emboli 10/08/17 TTE - mild LVH - EF 45% - akinesis of the apical septal wall, the apicalanterior wall, and the true apex 10/08/17 -  Ultrasound-guided paracentesis by IR yielding 1 L of fluid.  Neutrophil count close to 200, SAAG score cannot be calculated as there was no serum albumin from that day. 10/08/17 CT potential mass lesion in the central mesentery positioned between the SMV and second portion duodenum. Consider endoscopic ultrasound (EUS) evaluation and tumor sampling. FDG PET scan may be beneficial on outpatient basis.  10/11/2017 Repeat paracentesis not done due to "no ascites on [place]" 10/13/2017 CT stat ordered, study completed 10/14/2017 showing large ascites. 10/14/2017 repeat paracentesis 10/16/2017 repeat paracentesis - 5.1L  Subjective: Feels like "I am not getting any better."  SOB persists, but is not presently severe.  Denies cp, n/v, or abdom  pain.  Becomes very dyspneic w/ slightest exertion.  Very poor appetite.      Assessment & Plan:  Abdom distention - abdom compartment syndrome - Acute hypoxic resp failure - Hemochromatosis induced cirrhosis w/ ascites had a therapeutic and diagnostic paracentesis 9/6 with 1 L of fluid removed - was to have repeat paracentesis 9/9 but erroneously "no fluid was found on [place]" - CT abdom confirmed large volume ascites - s/p repeat large volume paracentesis 9/12 and again on 9/14 - follow w/o diuretic - midodrine dose increased today - renal fxn will not allow diuretic use at this time - abdom larger today, though weight down s/p para Filed Weights   10/15/17 0544 10/16/17 0452 10/17/17 0348  Weight: 120 kg 121.4 kg 117.6 kg    Potential abdominal mass For outpt f/u - Dr Benson Norway aware and following - perhaps this is contributing to the accumulation of ascites   Hypotension, tachycardia severely intravascularly dehydrated, but complicated by marked ascites - volume balance proving very difficult - presently remains intravascularly dry to exam - cont midodrine and increase to 10mg  TID  STEMI caused by LAD lesion requiring DES placement this admission - on dual antiplatelet therapy - asymptomatic at this time - this was the initial reason for his hospitalization   Hyponatremia  Due to above - worsened w/ attempt to diurese w/ aldactone and lasix - follow off diuretics   Hyperkalemia Due to AKI + aldactone - stopped alactone - K+ improved today   Moderate to severe protein calorie malnutrition Cont Protostat  Dyslipidemia Cont statin   Left upper pole kidney cyst Outpatient PCP follow-up  DM2 CBG  remains elevated above goal - adjust tx again and follow   MRSA screen +  DVT prophylaxis: [place] heparin  Code Status: FULL CODE Family Communication: no family present at time of exam  Disposition Plan: SDU  Consultants:  GI PCCM Cardiology   Antimicrobials:  Rocephin 9/6 >  9/8  Objective: Blood pressure (!) 97/50, pulse (!) 109, temperature 97.7 F (36.5 C), temperature source Axillary, resp. rate 20, height 6\' 4"  (1.93 m), weight 117.6 kg, SpO2 99 %.  Intake/Output Summary (Last 24 hours) at 10/17/2017 1043 Last data filed at 10/17/2017 0938 Gross per 24 hour  Intake 912 ml  Output 150 ml  Net 762 ml   Filed Weights   10/15/17 0544 10/16/17 0452 10/17/17 0348  Weight: 120 kg 121.4 kg 117.6 kg    Examination: General: SOB increased since yesterday but not in distress Lungs: mild bibasilar crackles w/ no wheezing  Cardiovascular: RRR w/o rub or M Abdomen: signif less distended post paracentesis but increased since 9/14, no rebound, BS hypoactive, no mass Extremities: 1+ B LE edema   CBC: Recent Labs  Lab 10/15/17 0333 10/16/17 0653 10/17/17 0430  WBC 11.5* 14.6* 16.0*  HGB 13.3 13.8 14.1  HCT 40.4 42.5 42.3  MCV 88.4 89.5 87.6  PLT 278 390 366   Basic Metabolic Panel: Recent Labs  Lab 10/12/17 0357 10/13/17 0315 10/14/17 0340 10/15/17 0333 10/16/17 0653 10/17/17 0430  NA 129* 127* 127* 129* 126* 124*  K 4.5 4.6 4.7 4.8 5.4* 5.1  CL 96* 93* 94* 98 95* 90*  CO2 21* 21* 22 19* 21* 20*  GLUCOSE 200* 206* 158* 138* 241* 246*  BUN 31* 37* 41* 33* 45* 57*  CREATININE 1.12 1.13 1.04 0.96 1.69* 1.77*  CALCIUM 8.6* 9.1 8.6* 8.4* 8.8* 9.1  MG 2.0 2.1 2.1 2.0  --   --   PHOS 4.2 4.6  --   --   --   --    GFR: Estimated Creatinine Clearance: 56.8 mL/min (A) (by C-G formula based on SCr of 1.77 mg/dL (H)).  Liver Function Tests: Recent Labs  Lab 10/14/17 0340 10/15/17 0333 10/16/17 0653 10/17/17 0430  AST 81* 57* 42* 54*  ALT 76* 57* 48* 49*  ALKPHOS 57 49 55 59  BILITOT 1.4* 1.0 1.1 1.1  PROT 5.7* 5.2* 5.5* 5.2*  ALBUMIN 2.9* 3.0* 2.8* 2.7*    Coagulation Profile: Recent Labs  Lab 10/16/17 0653  INR 1.22    HbA1C: Hgb A1c MFr Bld  Date/Time Value Ref Range Status  10/06/2017 03:31 AM 6.9 (H) 4.8 - 5.6 % Final     Comment:    (NOTE) Pre diabetes:          5.7%-6.4% Diabetes:              >6.4% Glycemic control for   <7.0% adults with diabetes   05/22/2014 03:10 PM 6.5 (H) 4.8 - 5.6 % Final    Comment:    (NOTE)         Pre-diabetes: 5.7 - 6.4         Diabetes: >6.4         Glycemic control for adults with diabetes: <7.0     CBG: Recent Labs  Lab 10/16/17 1221 10/16/17 1604 10/16/17 2115 10/17/17 0552 10/17/17 0728  GLUCAP 260* 254* 223* 238* 223*    Recent Results (from the past 240 hour(s))  Stat Gram stain     Status: None   Collection Time: 10/08/17  7:31 PM  Result Value Ref Range Status   Specimen Description PERITONEAL  Final   Special Requests NONE  Final   Gram Stain   Final    FEW WBC PRESENT,BOTH PMN AND MONONUCLEAR NO ORGANISMS SEEN Performed at [facility], [address], [place], [place] [address]    Report Status 10/08/2017 FINAL  Final  Culture, body fluid-bottle     Status: None   Collection Time: 10/08/17  7:31 PM  Result Value Ref Range Status   Specimen Description PERITONEAL  Final   Special Requests NONE  Final   Culture   Final    NO GROWTH 5 DAYS Performed at [facility] [address], [place], [place] [address]    Report Status 10/13/2017 FINAL  Final  Culture, Urine     Status: None   Collection Time: 10/09/17  7:47 PM  Result Value Ref Range Status   Specimen Description URINE, RANDOM  Final   Special Requests NONE  Final   Culture   Final    NO GROWTH Performed at [facility], [place] [address], [place], [place] [address]    Report Status 10/10/2017 FINAL  Final  Gram stain     Status: None   Collection Time: 10/13/17 12:44 PM  Result Value Ref Range Status   Specimen Description PLEURAL RIGHT  Final   Special Requests NONE  Final   Gram Stain   Final    RARE WBC PRESENT, PREDOMINANTLY PMN NO ORGANISMS SEEN Performed at [facility], [place] [address], [place], [place] [address]    Report Status 10/13/2017  FINAL  Final  Culture, body fluid-bottle     Status: None (Preliminary result)   Collection Time: 10/13/17 12:44 PM  Result Value Ref Range Status   Specimen Description PLEURAL RIGHT  Final   Special Requests NONE  Final   Culture   Final    NO GROWTH 3 DAYS Performed at [facility] [address], [place], [place] [address]    Report Status PENDING  Incomplete     Scheduled Meds: . aspirin EC  81 mg Oral Daily  . atorvastatin  40 mg Oral q1800  . buPROPion  300 mg Oral Daily  . feeding supplement (GLUCERNA SHAKE)  237 mL Oral BID BM  . feeding supplement (PRO-STAT SUGAR FREE 64)  30 mL Oral BID  . heparin injection (subcutaneous)  5,000 Units Subcutaneous Q8H  . insulin aspart  0-15 Units Subcutaneous TID WC  . insulin aspart  4 Units Subcutaneous TID WC  . insulin detemir  18 Units Subcutaneous QHS  . ipratropium-albuterol  3 mL Nebulization TID  . metoprolol tartrate  12.5 mg Oral BID  . midodrine  5 mg Oral TID WC  . naloxegol oxalate  12.5 mg Oral Daily  . polyethylene glycol  17 g Oral BID  . prasugrel  10 mg Oral Daily  . psyllium  1 packet Oral BID  . senna-docusate  1 tablet Oral BID  . zolpidem  5 mg Oral QHS     LOS: 12 days   Cherene Altes, MD [facility] Office  [phone] Pager - Text Page per Amion  If 7PM-7AM, please contact night-coverage per Amion 10/17/2017, 10:43 AM

## 2017-10-18 LAB — COMPREHENSIVE METABOLIC PANEL
ALT: 62 U/L — AB (ref 0–44)
AST: 73 U/L — AB (ref 15–41)
Albumin: 3 g/dL — ABNORMAL LOW (ref 3.5–5.0)
Alkaline Phosphatase: 64 U/L (ref 38–126)
Anion gap: 12 (ref 5–15)
BILIRUBIN TOTAL: 1.2 mg/dL (ref 0.3–1.2)
BUN: 66 mg/dL — AB (ref 8–23)
CO2: 21 mmol/L — ABNORMAL LOW (ref 22–32)
CREATININE: 1.81 mg/dL — AB (ref 0.61–1.24)
Calcium: 9.6 mg/dL (ref 8.9–10.3)
Chloride: 91 mmol/L — ABNORMAL LOW (ref 98–111)
GFR, EST AFRICAN AMERICAN: 43 mL/min — AB (ref >60)
GFR, EST NON AFRICAN AMERICAN: 37 mL/min — AB (ref >60)
Glucose, Bld: 230 mg/dL — ABNORMAL HIGH (ref 70–99)
Potassium: 5.2 mmol/L — ABNORMAL HIGH (ref 3.5–5.1)
Sodium: 124 mmol/L — ABNORMAL LOW (ref 135–145)
Total Protein: 5.6 g/dL — ABNORMAL LOW (ref 6.5–8.1)

## 2017-10-18 LAB — GLUCOSE, CAPILLARY
GLUCOSE-CAPILLARY: 236 mg/dL — AB (ref 70–99)
GLUCOSE-CAPILLARY: 210 mg/dL — AB (ref 70–99)
GLUCOSE-CAPILLARY: 174 mg/dL — AB (ref 70–99)

## 2017-10-18 LAB — CORTISOL: CORTISOL PLASMA: 31.2 ug/dL

## 2017-10-18 LAB — CULTURE, BODY FLUID W GRAM STAIN -BOTTLE

## 2017-10-18 LAB — URINALYSIS, ROUTINE W REFLEX MICROSCOPIC
BILIRUBIN URINE: NEGATIVE
Glucose, UA: NEGATIVE mg/dL
HGB URINE DIPSTICK: NEGATIVE
Ketones, ur: NEGATIVE mg/dL
Leukocytes, UA: NEGATIVE
Nitrite: NEGATIVE
PH: 5 (ref 5.0–8.0)
Protein, ur: NEGATIVE mg/dL
SPECIFIC GRAVITY, URINE: 1.019 (ref 1.005–1.030)

## 2017-10-18 LAB — CULTURE, BODY FLUID-BOTTLE: CULTURE: NO GROWTH

## 2017-10-18 MED ORDER — OCTREOTIDE ACETATE 100 MCG/ML IJ SOLN
100.0000 ug | Freq: Three times a day (TID) | INTRAMUSCULAR | Status: DC
  Administered 2017-10-18 – 2017-10-23 (×15): 100 ug via SUBCUTANEOUS
  Filled 2017-10-18 (×17): qty 1

## 2017-10-18 MED ORDER — INSULIN DETEMIR 100 UNIT/ML ~~LOC~~ SOLN
20.0000 [IU] | Freq: Every day | SUBCUTANEOUS | Status: DC
  Administered 2017-10-18: 20 [IU] via SUBCUTANEOUS
  Filled 2017-10-18 (×2): qty 0.2

## 2017-10-18 MED ORDER — MIDODRINE HCL 5 MG PO TABS
10.0000 mg | ORAL_TABLET | Freq: Three times a day (TID) | ORAL | Status: DC
  Administered 2017-10-18 – 2017-10-24 (×16): 10 mg via ORAL
  Filled 2017-10-18 (×23): qty 2

## 2017-10-18 MED ORDER — INSULIN ASPART 100 UNIT/ML ~~LOC~~ SOLN
8.0000 [IU] | Freq: Three times a day (TID) | SUBCUTANEOUS | Status: DC
  Administered 2017-10-18: 8 [IU] via SUBCUTANEOUS

## 2017-10-18 MED ORDER — ALPRAZOLAM 0.25 MG PO TABS
0.2500 mg | ORAL_TABLET | Freq: Once | ORAL | Status: AC
  Administered 2017-10-18: 0.25 mg via ORAL
  Filled 2017-10-18: qty 1

## 2017-10-18 MED ORDER — PATIROMER SORBITEX CALCIUM 8.4 G PO PACK
8.4000 g | PACK | Freq: Every day | ORAL | Status: DC
  Administered 2017-10-18 – 2017-10-19 (×2): 8.4 g via ORAL
  Filled 2017-10-18 (×3): qty 1

## 2017-10-18 MED ORDER — GUAIFENESIN-DM 100-10 MG/5ML PO SYRP
15.0000 mL | ORAL_SOLUTION | ORAL | Status: DC | PRN
  Administered 2017-10-18 – 2017-10-20 (×3): 15 mL via ORAL
  Filled 2017-10-18 (×3): qty 15

## 2017-10-18 NOTE — Progress Notes (Signed)
Offered ambulation however pt declined. Sts he felt very weak and SOB just getting to [facility]. Sts he is awaiting another paracentesis today. PT to see later today.  Yves Dill CES, ACSM 10:01 AM 10/18/2017

## 2017-10-18 NOTE — Progress Notes (Signed)
[place] TEAM 1 - Stepdown/ICU TEAM  Daniel Rivers  GQQ:[id number] DOB: Sep 01, 1950 DOA: 10/11/2017 PCP: [name], MD    Brief Narrative:  67 year old M w/ a hx of ankylosing spondylitis, anxiety, BPH, chronic back pain, CAD, GERD, dyslipidemia, obesity, nonalcoholic cirrhosis, hemochromatosis, and DM2 who was admitted by Cardiology on 10/24/2017 for a STEMI requiring a stent placement. Subsequently he developed abdominal pain and distention and was found to have ascites. GI was consulted. The patient was transferred to the [facility] service on 10/09/2017 for management of his medical issues.  The patient has recurrent ascites, and is hypotensive and tachycardic.  Significant Events: 10/24/2017 - Left heart catheterization with LAD DES placement  10/22/2017 - TTE -  EF 35% to 40%.Severe hypokinesis of the anteroseptal and apical myocardium - grade 2 diastolic dysfunction 07/10/10 [place] - Cirrhosis of the liver - small amount of abdominal ascites - 1.4 cm hypoechoic lesion upper pole left kidney, complex cystic versus small solid nodule 10/20/2017  CTchest  No pulmonary emboli 10/08/17 TTE - mild LVH - EF 45% - akinesis of the apical septal wall, the apicalanterior wall, and the true apex 10/08/17 -  Ultrasound-guided paracentesis by IR yielding 1 L of fluid.  Neutrophil count close to 200, SAAG score cannot be calculated as there was no serum albumin from that day. 10/08/17 CT potential mass lesion in the central mesentery positioned between the SMV and second portion duodenum. Consider endoscopic ultrasound (EUS) evaluation and tumor sampling. FDG PET scan may be beneficial on outpatient basis.  10/11/2017 Repeat paracentesis not done due to "no ascites on [place]" 10/13/2017 CT stat ordered, study completed 10/14/2017 showing large ascites. 10/14/2017 repeat paracentesis 10/16/2017 repeat paracentesis - 5.1L  Subjective: Pt feels that his abdom is more distended today, and again notes worsening SOB.  He denies cp, n/v, or HA.        Assessment & Plan:  Abdom distention - abdom compartment syndrome - Acute hypoxic resp failure - Hemochromatosis induced cirrhosis w/ ascites had a therapeutic and diagnostic paracentesis 9/6 with 1 L of fluid removed - was to have repeat paracentesis 9/9 but erroneously "no fluid was found on [place]" - CT abdom confirmed large volume ascites - s/p repeat large volume paracentesis 9/12 and again on 9/14 - intolerant to diuretic as it rapidly worsens renal failure - midodrine stopped by GI for unclear reasonsm, and now BP falling - weight is climbing - resume midodrine - consider full tx for hepatorenal w/ midodrine + octreotide + high dose albumin  Filed Weights   10/16/17 0452 10/17/17 0348 10/18/17 0500  Weight: 121.4 kg 117.6 kg 119.2 kg    Hypotension, tachycardia severely intravascularly dehydrated, but complicated by marked ascites - volume balance proving very difficult - presently remains intravascularly dry - resume midodrine and increase to 10mg  TID  Acute kidney injury ?hepatorenal picture now at play - crt steadily climbing - pt hypotensive, and yet grossly volume overloaded - I have asked Nephrology to comment on utility of full tx for hepatorenal to include octreotide + high dose albumin  Recent Labs  Lab 10/14/17 0340 10/15/17 0333 10/16/17 0653 10/17/17 0430 10/18/17 0419  CREATININE 1.04 0.96 1.69* 1.77* 1.81*    Potential abdominal mass For outpt f/u - Dr Benson Norway aware and following - perhaps this is contributing to the accumulation of ascites   STEMI caused by LAD lesion requiring DES placement this admission - on dual antiplatelet therapy - asymptomatic at this time - this was the initial  reason for his hospitalization   Hyponatremia  Due to above - Na is holding steady off diuretics   Hyperkalemia Due to AKI + aldactone - stopped alactone - K+ holding steady for now   Moderate to severe protein calorie malnutrition Cont Protostat  Dyslipidemia Cont  statin   Left upper pole kidney cyst Outpatient PCP follow-up  DM2 CBG not yet at goal - adjust tx again and follow   MRSA screen +  DVT prophylaxis: [place] heparin  Code Status: FULL CODE Family Communication: no family present at time of exam  Disposition Plan: SDU  Consultants:  GI PCCM Cardiology  Nephrology   Antimicrobials:  Rocephin 9/6 > 9/8  Objective: Blood pressure (!) 85/66, pulse (!) 117, temperature 97.7 F (36.5 C), temperature source Oral, resp. rate 19, height 6\' 4"  (1.93 m), weight 119.2 kg, SpO2 97 %.  Intake/Output Summary (Last 24 hours) at 10/18/2017 1058 Last data filed at 10/18/2017 0230 Gross per 24 hour  Intake 300 ml  Output 1 ml  Net 299 ml   Filed Weights   10/16/17 0452 10/17/17 0348 10/18/17 0500  Weight: 121.4 kg 117.6 kg 119.2 kg    Examination: General: uncomfortable respirations but not in distress Lungs: mild bibasilar crackles - no wheeze  Cardiovascular: tachycardic - regular - no M or rub  Abdomen: ascites has worsened since last exam, no rebound, BS hypoactive, no mass Extremities: 2+ B LE edema   CBC: Recent Labs  Lab 10/15/17 0333 10/16/17 0653 10/17/17 0430  WBC 11.5* 14.6* 16.0*  HGB 13.3 13.8 14.1  HCT 40.4 42.5 42.3  MCV 88.4 89.5 87.6  PLT 278 390 962   Basic Metabolic Panel: Recent Labs  Lab 10/12/17 0357 10/13/17 0315 10/14/17 0340 10/15/17 0333 10/16/17 0653 10/17/17 0430 10/18/17 0419  NA 129* 127* 127* 129* 126* 124* 124*  K 4.5 4.6 4.7 4.8 5.4* 5.1 5.2*  CL 96* 93* 94* 98 95* 90* 91*  CO2 21* 21* 22 19* 21* 20* 21*  GLUCOSE 200* 206* 158* 138* 241* 246* 230*  BUN 31* 37* 41* 33* 45* 57* 66*  CREATININE 1.12 1.13 1.04 0.96 1.69* 1.77* 1.81*  CALCIUM 8.6* 9.1 8.6* 8.4* 8.8* 9.1 9.6  MG 2.0 2.1 2.1 2.0  --   --   --   PHOS 4.2 4.6  --   --   --   --   --    GFR: Estimated Creatinine Clearance: 55.9 mL/min (A) (by C-G formula based on SCr of 1.81 mg/dL (H)).  Liver Function Tests: Recent  Labs  Lab 10/15/17 0333 10/16/17 0653 10/17/17 0430 10/18/17 0419  AST 57* 42* 54* 73*  ALT 57* 48* 49* 62*  ALKPHOS 49 55 59 64  BILITOT 1.0 1.1 1.1 1.2  PROT 5.2* 5.5* 5.2* 5.6*  ALBUMIN 3.0* 2.8* 2.7* 3.0*    Coagulation Profile: Recent Labs  Lab 10/16/17 0653  INR 1.22    HbA1C: Hgb A1c MFr Bld  Date/Time Value Ref Range Status  10/06/2017 03:31 AM 6.9 (H) 4.8 - 5.6 % Final    Comment:    (NOTE) Pre diabetes:          5.7%-6.4% Diabetes:              >6.4% Glycemic control for   <7.0% adults with diabetes   05/22/2014 03:10 PM 6.5 (H) 4.8 - 5.6 % Final    Comment:    (NOTE)         Pre-diabetes: 5.7 - 6.4  Diabetes: >6.4         Glycemic control for adults with diabetes: <7.0     CBG: Recent Labs  Lab 10/17/17 0552 10/17/17 0728 10/17/17 1637 10/17/17 2142 10/18/17 0624  GLUCAP 238* 223* 238* 196* 236*    Recent Results (from the past 240 hour(s))  Stat Gram stain     Status: None   Collection Time: 10/08/17  7:31 PM  Result Value Ref Range Status   Specimen Description PERITONEAL  Final   Special Requests NONE  Final   Gram Stain   Final    FEW WBC PRESENT,BOTH PMN AND MONONUCLEAR NO ORGANISMS SEEN Performed at [facility], [place] [address], [place], [place] [address]    Report Status 10/08/2017 FINAL  Final  Culture, body fluid-bottle     Status: None   Collection Time: 10/08/17  7:31 PM  Result Value Ref Range Status   Specimen Description PERITONEAL  Final   Special Requests NONE  Final   Culture   Final    NO GROWTH 5 DAYS Performed at [facility] [address], [place], [place] [address]    Report Status 10/13/2017 FINAL  Final  Culture, Urine     Status: None   Collection Time: 10/09/17  7:47 PM  Result Value Ref Range Status   Specimen Description URINE, RANDOM  Final   Special Requests NONE  Final   Culture   Final    NO GROWTH Performed at [facility], [place] [address] [place], [place]  [address]    Report Status 10/10/2017 FINAL  Final  Gram stain     Status: None   Collection Time: 10/13/17 12:44 PM  Result Value Ref Range Status   Specimen Description PLEURAL RIGHT  Final   Special Requests NONE  Final   Gram Stain   Final    RARE WBC PRESENT, PREDOMINANTLY PMN NO ORGANISMS SEEN Performed at [facility], [place] [address] [place], [place] [address]    Report Status 10/13/2017 FINAL  Final  Culture, body fluid-bottle     Status: None   Collection Time: 10/13/17 12:44 PM  Result Value Ref Range Status   Specimen Description PLEURAL RIGHT  Final   Special Requests NONE  Final   Culture   Final    NO GROWTH 5 DAYS Performed at [facility] [address], [place], [place] [address]    Report Status 10/18/2017 FINAL  Final     Scheduled Meds: . aspirin EC  81 mg Oral Daily  . buPROPion  300 mg Oral Daily  . feeding supplement (GLUCERNA SHAKE)  237 mL Oral BID BM  . feeding supplement (PRO-STAT SUGAR FREE 64)  30 mL Oral BID  . heparin injection (subcutaneous)  5,000 Units Subcutaneous Q8H  . insulin aspart  0-15 Units Subcutaneous TID WC  . insulin aspart  6 Units Subcutaneous TID WC  . insulin detemir  18 Units Subcutaneous QHS  . naloxegol oxalate  12.5 mg Oral Daily  . polyethylene glycol  17 g Oral BID  . prasugrel  10 mg Oral Daily  . psyllium  1 packet Oral BID  . senna-docusate  1 tablet Oral BID  . zolpidem  5 mg Oral QHS     LOS: 13 days   Cherene Altes, MD [facility] Office  [phone] Pager - Text Page per Amion  If 7PM-7AM, please contact night-coverage per Amion 10/18/2017, 10:58 AM

## 2017-10-18 NOTE — Progress Notes (Signed)
Subjective: [name] is a 67 year old white male with multiple medical problems including cirrhosis from hemochromatosis diabetes coronary artery disease chronic back pain hyperlipidemia and GERD and ankylosing spondylitis, who was admitted on 84 with a STEMI requiring PCI and DES placement. His post cath cope cope course is complicated by worsening abdominal distention and pain requiring large volume paracentesis done on 10/14/2017 and 10/16/2017. The cytologies are pending. He developed worsening renal function with a creatinine of 1.81 today. He complains of abdominal pain and distention with abdominal pressure and poor appetite and nausea. He was seen by Dr. Joette Catching earlier today and am catheter was placed. 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   Objective: Vital signs in last 24 hours: Temp:  [97.5 F (36.4 C)-98.1 F (36.7 C)] 97.7 F (36.5 C) (09/16 0734) Pulse Rate:  [113-118] 117 (09/16 0734) Resp:  [19-22] 19 (09/16 0734) BP: (85-99)/(59-74) 85/66 (09/16 0734) SpO2:  [94 %-97 %] 97 % (09/16 0734) Weight:  [323.5 kg] 119.2 kg (09/16 0500) Last BM Date: 10/17/17  Intake/Output from previous day: 09/15 0701 - 09/16 0700 In: 762 [P.O.:662; IV Piggyback:100] Out: 1 [Emesis/NG output:1] Intake/Output this shift: No intake/output data recorded.  General appearance: fatigued, moderately obese, pale, slowed mentation, somewhat confused Resp: rales bibasilar Cardio: tachycardic withregular rate and rhythm, S1, S2 normal, no murmur, click, rub or gallop GI: soft, markedly distended with fluid wave, diffusely tender with hypoactive bowel sounds mal; no masses,  no organomegaly Extremities: have 1+ pitting edema  Lab Results: Recent Labs    10/16/17 0653 10/17/17 0430  WBC 14.6* 16.0*  HGB 13.8 14.1  HCT 42.5 42.3  PLT 390 393   BMET Recent Labs   10/16/17 0653 10/17/17 0430 10/18/17 0419  NA 126* 124* 124*  K 5.4* 5.1 5.2*  CL 95* 90* 91*  CO2 21* 20* 21*  GLUCOSE 241* 246* 230*  BUN 45* 57* 66*  CREATININE 1.69* 1.77* 1.81*  CALCIUM 8.8* 9.1 9.6   LFT Recent Labs    10/18/17 0419  PROT 5.6*  ALBUMIN 3.0*  AST 73*  ALT 62*  ALKPHOS 64  BILITOT 1.2   PT/INR Recent Labs    10/16/17 0653  LABPROT 15.3*  INR 1.22    Studies/Results: No results found.  Medications: I have reviewed the patient's current medications.  Assessment/Plan: 1) Cirrhosis secondary to hemochromatosis with worsening ascites and acute kidney injury in the setting of a mesenteric mass. Cytology of this ascitic fluid is pending. This could very well be malignant ascites will agree with the re-starting Midodrine now. I fully agree with Dr. Thereasa Solo. Goals goals of treatment need to be defined in this case as treatment option seemed to be very limited in view of all the complications he has had since admission. Low FeNa ?HRS vs intravascular depletion.[name] 2) STEMI status post PCI and DES in LAD. 3) Hyponatremia/hyperkalemia.  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   5) DM  LOS: 13 days   Daniel Rivers 10/18/2017, 6:15 PM

## 2017-10-18 NOTE — Progress Notes (Signed)
Physical Therapy Treatment Patient Details Name: Daniel Rivers MRN: [id number] DOB: 09-18-1950 Today's Date: 10/18/2017    History of Present Illness Pt is a 67 y.o. male admitted 10/21/2017 for a STEMI requiring a stent placement. Subsequently developed abdominal pain and distention and was found to have ascites, GI was consulted. Pt with ascites s/p paracentesis 9/6 and 9/12 (removed 6.4 liters). PMH includes ankylosing spondylitis, anxiety, chronic back pain, CAD, obesity, nonalcoholic cirrhosis, DM2.    PT Comments    Pt admitted with above diagnosis. Pt currently with functional limitations due to balance and endurance deficits. Pt was only able to perform UE and LE exercises as he was too lethargic and weak to come to EOB.  Will continue and progress pt as pt able.  Pt will benefit from skilled PT to increase their independence and safety with mobility to allow discharge to the venue listed below.     Follow Up Recommendations  Home health PT;Supervision/Assistance - 24 hour(May need to update if not better at next rx. )     Equipment Recommendations  Rolling walker with 5" wheels(if pt can't locate his )    Recommendations for Other Services       Precautions / Restrictions Precautions Precautions: Fall Restrictions Weight Bearing Restrictions: No    Mobility  Bed Mobility Overal bed mobility: Needs Assistance             General bed mobility comments: Pt lethargic.  Slow to respond.  BP soft as well at 91/69 at rest in supine.  Performed bed exercises only due to pt with poor arousal and weakness  as well as decr VS.   Transfers                    Ambulation/Gait                 Stairs             Wheelchair Mobility    Modified Rankin (Stroke Patients Only)       Balance                                            Cognition Arousal/Alertness: Awake/alert Behavior During Therapy: WFL for tasks  assessed/performed Overall Cognitive Status: Impaired/Different from baseline Area of Impairment: Memory;Following commands;Safety/judgement;Awareness;Problem solving;Attention                   Current Attention Level: Sustained Memory: Decreased short-term memory Following Commands: Follows one step commands with increased time Safety/Judgement: Decreased awareness of safety Awareness: Intellectual Problem Solving: Slow processing;Requires verbal cues;Decreased initiation;Difficulty sequencing;Requires tactile cues General Comments: Pt with slow processing and requiring increased time and cues throughout session. Pt with incr confusion today.       Exercises General Exercises - Upper Extremity Shoulder Flexion: AAROM;Both;10 reps;Supine Shoulder Horizontal ADduction: AAROM;Both;10 reps;Supine General Exercises - Lower Extremity Ankle Circles/Pumps: AAROM;Both;10 reps;Supine Heel Slides: AAROM;Both;10 reps;Supine Hip ABduction/ADduction: AAROM;Both;10 reps;Supine    General Comments General comments (skin integrity, edema, etc.): Pts lunch was beside bed untouched.  Asked pt if he wanted to try and eat and he stated he would drink Glucerna.  Pt could not hold the cup to drink the Glucerna.  Had to hold it for him and help him while drinking.       Pertinent Vitals/Pain Pain Assessment: Faces Faces Pain Scale: Hurts even  more Pain Location: Bilateral hips Pain Descriptors / Indicators: Constant;Discomfort;Grimacing Pain Intervention(s): Limited activity within patient's tolerance;Monitored during session;Repositioned    Home Living                      Prior Function            PT Goals (current goals can now be found in the care plan section) Acute Rehab PT Goals Patient Stated Goal: return home Progress towards PT goals: Not progressing toward goals - comment(too lethargic and weak to get to eOB)    Frequency    Min 3X/week      PT Plan Current  plan remains appropriate    Co-evaluation              AM-PAC PT "6 Clicks" Daily Activity  Outcome Measure  Difficulty turning over in bed (including adjusting bedclothes, sheets and blankets)?: Unable Difficulty moving from lying on back to sitting on the side of the bed? : Unable Difficulty sitting down on and standing up from a chair with arms (e.g., wheelchair, bedside commode, etc,.)?: Unable Help needed moving to and from a bed to chair (including a wheelchair)?: Total Help needed walking in hospital room?: Total Help needed climbing 3-5 steps with a railing? : Total 6 Click Score: 6    End of Session Equipment Utilized During Treatment: Gait belt;Oxygen Activity Tolerance: Patient limited by fatigue Patient left: in bed;with call bell/phone within reach Nurse Communication: Mobility status PT Visit Diagnosis: Unsteadiness on feet (R26.81);Muscle weakness (generalized) (M62.81);Other abnormalities of gait and mobility (R26.89)     Time: 1437-1450 PT Time Calculation (min) (ACUTE ONLY): 13 min  Charges:  $Therapeutic Exercise: 8-22 mins                     [place] Pager:  [phone]  Office:  [place] 10/18/2017, 3:08 PM

## 2017-10-18 NOTE — Consult Note (Signed)
Reason for Consult: AKI Referring Physician: Thereasa Solo, MD  Daniel Rivers is an 67 y.o. male.  HPI:  Mr. 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  He did well post cath until he developed increasing abdominal distension and pain.  He was noted to have marked ascites and GI was consulted.  He underwent 2 large volume paracenteses 10/14/17 (6.4 liters), and on 10/16/17 (5.1 liters).  Of note, no cell count or culture and sensitivities were sent on the paracenteses, but cytology is pending from 10/16/17.  His course has been complicated by recurrent ascites, hypotension, tachycardia, as well as AKI.  The trend in Scr is seen below.    We were consulted to help further evaluate his AKI.  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    He denies any previous history of CKD nor does he have a family history of it.  He denies any NSAIDs/COX-II I"s but does have urinary retention and difficulty voiding.  He has received IV contrast with cardiac cath on 10/04/2017 as well as CT scans on 9/5 and 10/08/17.    Trend in Creatinine: Creatinine, Ser  Date/Time Value Ref Range Status  10/18/2017 04:19 AM 1.81 (H) 0.61 - 1.24 mg/dL Final  10/17/2017 04:30 AM 1.77 (H) 0.61 - 1.24 mg/dL Final  10/16/2017 06:53 AM 1.69 (H) 0.61 - 1.24 mg/dL Final  10/15/2017 03:33 AM 0.96 0.61 - 1.24 mg/dL Final  10/14/2017 03:40 AM 1.04 0.61 - 1.24 mg/dL Final  10/13/2017 03:15 AM 1.13 0.61 - 1.24 mg/dL Final  10/12/2017 03:57 AM 1.12 0.61 - 1.24 mg/dL Final  10/11/2017 03:44 AM 1.05 0.61 - 1.24 mg/dL Final  10/10/2017 04:13 AM 1.14  0.61 - 1.24 mg/dL Final  10/09/2017 02:09 AM 1.08 0.61 - 1.24 mg/dL Final  10/08/2017 03:30 AM 0.97 0.61 - 1.24 mg/dL Final  10/06/2017 03:31 AM 0.88 0.61 - 1.24 mg/dL Final  10/04/2017 12:41 PM 0.90 0.61 - 1.24 mg/dL Final  10/18/2017 11:46 AM 1.50 (H) 0.61 - 1.24 mg/dL Final  09/16/2017 11:18 AM 0.89 0.61 - 1.24 mg/dL Final  06/29/2017 12:08 AM 0.97 0.61 - 1.24 mg/dL Final  06/08/2014 03:42 AM 0.87 0.61 - 1.24 mg/dL Final  05/23/2014 04:01 AM 0.90 0.50 - 1.35 mg/dL Final  03/18/2007 02:03 AM 0.77  Final    PMH:   Past Medical History:  Diagnosis Date  . Ankylosing spondylitis of cervical region ([place])   . Ankylosing spondylitis of lumbar region ([place])   . Anxiety   . BPH (benign prostatic hypertrophy)   . Bursitis, trochanteric   . Chronic back pain   . Chronic cervical pain   . Coronary artery disease   . Daily headache    "recently; related to RX for my heart" (05/21/2014)  . Depression   . GERD (gastroesophageal reflux disease)   . Hemochromatosis   . High cholesterol   . History of hiatal hernia   . Hypertension   . Non-alcoholic cirrhosis ([place])    "from the hemochromatosis"  . Obesity   . Osteoarthritis    "joints" (05/20/2104)  . Rotator cuff syndrome   . Tendonitis   . Type II  diabetes mellitus ([facility])     PSH:   Past Surgical History:  Procedure Laterality Date  . ANAL FISSURE REPAIR    . ANKLE FRACTURE SURGERY Left   . BACK SURGERY    . BONE TISSUE     "I've had removal of excess tissue as a result of my ankylosing spondylitis"  . CARDIAC CATHETERIZATION N/A 06/07/2014   Procedure: Coronary Stent Intervention;  Surgeon: Adrian Prows, MD;  Location: [facility];  Service: Cardiovascular;  Laterality: N/A;  . CARPAL TUNNEL RELEASE Bilateral   . COLON SURGERY    . CORONARY ANGIOPLASTY WITH STENT PLACEMENT  2009; 05/22/2014   ?3; ?1  . CORONARY STENT INTERVENTION N/A 10/04/2017   Procedure: CORONARY STENT INTERVENTION;  Surgeon: Nigel Mormon, MD;   Location: [facility];  Service: Cardiovascular;  Laterality: N/A;  . CORONARY THROMBECTOMY N/A 10/10/2017   Procedure: Coronary Thrombectomy;  Surgeon: Nigel Mormon, MD;  Location: [facility];  Service: Cardiovascular;  Laterality: N/A;  . cortisone injection    . FRACTURE SURGERY    . I&D EXTREMITY Left 06/29/2017   Procedure: IRRIGATION AND DEBRIDEMENT EXTREMITY, LEFT FIFTH DIGIT AMPUTATION;  Surgeon: Iran Planas, MD;  Location: [facility];  Service: Orthopedics;  Laterality: Left;  . IR PARACENTESIS  10/14/2017  . IR THORACENTESIS ASP PLEURAL SPACE W/IMG GUIDE  10/13/2017  . LEFT HEART CATH AND CORONARY ANGIOGRAPHY N/A 10/03/2017   Procedure: LEFT HEART CATH AND CORONARY ANGIOGRAPHY;  Surgeon: Nigel Mormon, MD;  Location: [facility];  Service: Cardiovascular;  Laterality: N/A;  . LEFT HEART CATH AND CORONARY ANGIOGRAPHY N/A 10/18/2017   Procedure: LEFT HEART CATH AND CORONARY ANGIOGRAPHY;  Surgeon: Nigel Mormon, MD;  Location: [facility];  Service: Cardiovascular;  Laterality: N/A;  . LEFT HEART CATHETERIZATION WITH CORONARY ANGIOGRAM N/A 05/22/2014   Procedure: LEFT HEART CATHETERIZATION WITH CORONARY ANGIOGRAM;  Surgeon: Adrian Prows, MD;  Location: [facility];  Service: Cardiovascular;  Laterality: N/A;  . LUMBAR DISC SURGERY  X 2   "ruptured/herniated/growing together"  . PERCUTANEOUS CORONARY STENT INTERVENTION (PCI-S)  05/22/2014   Procedure: PERCUTANEOUS CORONARY STENT INTERVENTION (PCI-S);  Surgeon: Adrian Prows, MD;  Location: [facility] C[facility];  Service: Cardiovascular;;  mid LAD DES  . PERCUTANEOUS CORONARY STENT INTERVENTION (PCI-S)  06/07/2014   des to rca  . RIGHT HEART CATH N/A 10/27/2017   Procedure: RIGHT HEART CATH;  Surgeon: Nigel Mormon, MD;  Location: [place] [facility];  Service: Cardiovascular;  Laterality: N/A;  . SHOULDER ARTHROSCOPY W/ ROTATOR CUFF REPAIR Bilateral   . SMALL INTESTINE SURGERY  1960   "took out 14 inches"  .  ULNAR TUNNEL RELEASE Left     Allergies:  Allergies  Allergen Reactions  . Amlodipine Besy-Benazepril Hcl Cough  . Brilinta [Ticagrelor] Cough  . Naproxen Other (See Comments)    Worsens Tinnitus    Medications:   Prior to Admission medications   Medication Sig Start Date End Date Taking? Authorizing Provider  aspirin 81 MG tablet Take 81 mg by mouth daily.    Yes [provider]  bisacodyl (DULCOLAX) 5 MG EC tablet Take 10 mg by mouth at bedtime.    Yes [provider]  buPROPion (WELLBUTRIN XL) 300 MG 24 hr tablet Take 300 mg by mouth daily.  04/05/15  Yes [provider]  carvedilol (COREG) 3.125 MG tablet Take 3.125 mg by mouth 2 (two) times daily.   Yes [provider]  Cholecalciferol (VITAMIN D3) 2000 UNITS TABS Take 1 tablet by mouth daily.   Yes [provider]  diclofenac (VOLTAREN) 75 MG EC tablet Take 1 tablet (75 mg total) by mouth 2 (two) times daily with a meal. As needed Patient taking differently: Take 75 mg by mouth 2 (two) times daily as needed (hip pain).  10/07/16  Yes Meredith Staggers, MD  furosemide (LASIX) 20 MG tablet Take 20 mg by mouth as needed for fluid.  09/26/12  Yes [provider]  glimepiride (AMARYL) 2 MG tablet Take 3 mg by mouth daily before breakfast.    Yes [provider]  KRILL OIL PO Take 1 tablet by mouth daily.    Yes [provider]  losartan (COZAAR) 100 MG tablet Take 50 mg by mouth daily.    Yes [provider]  Magnesium Oxide 400 MG CAPS Take 400 mg by mouth daily.    Yes [provider]  metFORMIN (GLUCOPHAGE) 1000 MG tablet Take 1,000 mg by mouth every 12 (twelve) hours.   Yes [provider]  morphine (MS CONTIN) 60 MG 12 hr tablet Take 1 tablet (60 mg total) by mouth 3 (three) times daily. 08/02/17  Yes Bayard Hugger, NP  Multiple Vitamin (MULTIVITAMIN) capsule Take 1 capsule by mouth daily. NO IRON   Yes [provider]   NITROSTAT 0.4 MG SL tablet Place 0.4 mg under the tongue every 5 (five) minutes as needed for chest pain.  11/16/11  Yes [provider]  polyethylene glycol powder (GLYCOLAX/MIRALAX) powder Using measuring cap mix  17gm in water and drink two times daily Patient taking differently: Take 17 g by mouth 2 (two) times daily.  06/19/13  Yes Meredith Staggers, MD  pravastatin (PRAVACHOL) 80 MG tablet Take 80 mg by mouth at bedtime.    Yes [provider]  Psyllium (METAMUCIL PO) Take 17 g by mouth 2 (two) times daily.    Yes [provider]  zolpidem (AMBIEN) 10 MG tablet Take 10 mg by mouth at bedtime.    Yes [provider]  oxyCODONE (OXY IR/ROXICODONE) 5 MG immediate release tablet Take 1 tablet (5 mg total) by mouth every 6 (six) hours as needed for severe pain. Patient not taking: Reported on 10/04/2017 08/02/17   Bayard Hugger, NP    Inpatient medications: . aspirin EC  81 mg Oral Daily  . buPROPion  300 mg Oral Daily  . feeding supplement (GLUCERNA SHAKE)  237 mL Oral BID BM  . feeding supplement (PRO-STAT SUGAR FREE 64)  30 mL Oral BID  . heparin injection (subcutaneous)  5,000 Units Subcutaneous Q8H  . insulin aspart  0-15 Units Subcutaneous TID WC  . insulin aspart  8 Units Subcutaneous TID WC  . insulin detemir  20 Units Subcutaneous QHS  . midodrine  10 mg Oral TID WC  . naloxegol oxalate  12.5 mg Oral Daily  . polyethylene glycol  17 g Oral BID  . prasugrel  10 mg Oral Daily  . psyllium  1 packet Oral BID  . senna-docusate  1 tablet Oral BID  . zolpidem  5 mg Oral QHS    Discontinued Meds:   Medications Discontinued During This Encounter  Medication Reason  . nitroGLYCERIN 1 mg/10 mL (100 mcg/mL) - IR/CATH LAB Patient Discharge  . tirofiban (AGGRASTAT) infusion 50 mcg/mL 250 mL Patient Discharge  . tirofiban (AGGRASTAT) bolus via infusion Patient Discharge  . Radial Cocktail (Verapamil 2.5  mg, NTG, Lidocaine) Patient Discharge  .  lidocaine (PF) (XYLOCAINE) 1 % injection Patient Discharge  . heparin injection Patient Discharge  . Heparin (Porcine) in NaCl 1000-0.9 UT/500ML-% SOLN Patient Discharge  . fentaNYL (SUBLIMAZE) injection Patient Discharge  . midazolam (VERSED) injection Patient Discharge  . tirofiban (AGGRASTAT) bolus via infusion 2,835 mcg Completed Course  . atorvastatin (LIPITOR) tablet 80 mg   . losartan (COZAAR) tablet 12.5 mg   . tirofiban (AGGRASTAT) infusion 50 mcg/mL 250 mL   . polyethylene glycol powder (GLYCOLAX/MIRALAX) container 17 g   . iohexol (OMNIPAQUE) 350 MG/ML injection Patient Discharge  . heparin injection Patient Discharge  . Heparin (Porcine) in NaCl 1000-0.9 UT/500ML-% SOLN Patient Discharge  . Radial Cocktail/Verapamil only Patient Discharge  . lidocaine (PF) (XYLOCAINE) 1 % injection Patient Discharge  . acetaminophen (TYLENOL) tablet 151 mg Duplicate  . ondansetron (ZOFRAN) injection 4 mg Duplicate  . ondansetron (ZOFRAN) injection 4 mg Duplicate  . insulin aspart (novoLOG) injection 0-20 Units   . losartan (COZAAR) tablet 25 mg   . losartan (COZAAR) tablet 12.5 mg   . acetaminophen (TYLENOL) tablet 650 mg   . sodium chloride flush (NS) 0.9 % injection 3 mL   . 0.9 %  sodium chloride infusion   . sodium chloride flush (NS) 0.9 % injection 3 mL   . sodium chloride flush (NS) 0.9 % injection 3 mL   . spironolactone (ALDACTONE) tablet 25 mg   . furosemide (LASIX) tablet 20 mg   . 0.9 %  sodium chloride infusion   . 0.9 %  sodium chloride infusion   . furosemide (LASIX) tablet 20 mg   . sodium chloride flush (NS) 0.9 % injection 3 mL   . Influenza vac split quadrivalent PF (FLUZONE HIGH-DOSE) injection 0.5 mL   . spironolactone (ALDACTONE) tablet 25 mg   . lactated ringers infusion   . acetaminophen (TYLENOL) tablet 650 mg   . lidocaine (XYLOCAINE) 2 % injection Returned to ADS  . cefTRIAXone (ROCEPHIN) 2 g in sodium chloride 0.9 % 100 mL IVPB   . furosemide (LASIX)  injection 40 mg   . diazepam (VALIUM) tablet 5-10 mg   . insulin detemir (LEVEMIR) injection 10 Units   . docusate sodium (COLACE) capsule 100 mg   . bisacodyl (DULCOLAX) EC tablet 10 mg   . simethicone (MYLICON) 40 VO/1.6WV suspension 40 mg   . ipratropium-albuterol (DUONEB) 0.5-2.5 (3) MG/3ML nebulizer solution 3 mL   . 0.45 % sodium chloride infusion   . 0.9 %  sodium chloride infusion   . feeding supplement (GLUCERNA SHAKE) (GLUCERNA SHAKE) liquid 237 mL   . feeding supplement (PRO-STAT SUGAR FREE 64) liquid 30 mL   . 0.9 %  sodium chloride infusion   . spironolactone (ALDACTONE) tablet 50 mg   . furosemide (LASIX) injection 40 mg   . insulin detemir (LEVEMIR) injection 14 Units   . midodrine (PROAMATINE) tablet 5 mg   . midodrine (PROAMATINE) tablet 10 mg   . atorvastatin (LIPITOR) tablet 40 mg   . insulin aspart (novoLOG) injection 4 Units   . metoprolol tartrate (LOPRESSOR) tablet 12.5 mg   . ipratropium-albuterol (DUONEB) 0.5-2.5 (3) MG/3ML nebulizer solution 3 mL   . ipratropium-albuterol (DUONEB) 0.5-2.5 (3) MG/3ML nebulizer solution 3 mL   . insulin aspart (novoLOG) injection 6 Units   . insulin detemir (LEVEMIR) injection 18 Units     Social History:  reports that he quit smoking about 31 years ago. His smoking use included cigarettes. He has  a 115.00 pack-year smoking history. He has never used smokeless tobacco. He reports that he drinks alcohol. He reports that he does not use drugs.  Family History:  No family history on file.  A comprehensive review of systems was negative except for: Constitutional: positive for anorexia, fatigue and malaise Gastrointestinal: positive for abdominal pain, nausea and abdominal distension Weight change: 1.628 kg  Intake/Output Summary (Last 24 hours) at 10/18/2017 1551 Last data filed at 10/18/2017 0230 Gross per 24 hour  Intake 50 ml  Output 1 ml  Net 49 ml   BP (!) 85/66 (BP Location: Right Arm)   Pulse (!) 117   Temp 97.7 F  (36.5 C) (Oral)   Resp 19   Ht 6\' 4"  (1.93 m)   Wt 119.2 kg   SpO2 97%   BMI 31.99 kg/m  Vitals:   10/18/17 0000 10/18/17 0326 10/18/17 0500 10/18/17 0734  BP: 99/62 96/74  (!) 85/66  Pulse: (!) 118   (!) 117  Resp: (!) 22 (!) 22  19  Temp:  98.1 F (36.7 C)  97.7 F (36.5 C)  TempSrc:    Oral  SpO2: 95% 94%  97%  Weight:   119.2 kg   Height:         General appearance: fatigued, mild distress and markedly distended abdomen and uncomfortable Head: Normocephalic, without obvious abnormality, atraumatic Eyes: negative findings: lids and lashes normal, conjunctivae and sclerae normal and corneas clear Resp: rales bibasilar Cardio: no rub and tachycardic GI: markedly distended with + fluid wave and mild tenderness to deep palpation but no guarding or rebound Extremities: edema 1+ pitting  Labs: Basic Metabolic Panel: Recent Labs  Lab 10/12/17 0357 10/13/17 0315 10/14/17 0340 10/15/17 0333 10/16/17 0653 10/17/17 0430 10/18/17 0419  NA 129* 127* 127* 129* 126* 124* 124*  K 4.5 4.6 4.7 4.8 5.4* 5.1 5.2*  CL 96* 93* 94* 98 95* 90* 91*  CO2 21* 21* 22 19* 21* 20* 21*  GLUCOSE 200* 206* 158* 138* 241* 246* 230*  BUN 31* 37* 41* 33* 45* 57* 66*  CREATININE 1.12 1.13 1.04 0.96 1.69* 1.77* 1.81*  ALBUMIN 3.3* 3.2* 2.9* 3.0* 2.8* 2.7* 3.0*  CALCIUM 8.6* 9.1 8.6* 8.4* 8.8* 9.1 9.6  PHOS 4.2 4.6  --   --   --   --   --    Liver Function Tests: Recent Labs  Lab 10/16/17 0653 10/17/17 0430 10/18/17 0419  AST 42* 54* 73*  ALT 48* 49* 62*  ALKPHOS 55 59 64  BILITOT 1.1 1.1 1.2  PROT 5.5* 5.2* 5.6*  ALBUMIN 2.8* 2.7* 3.0*   No results for input(s): LIPASE, AMYLASE in the last 168 hours. No results for input(s): AMMONIA in the last 168 hours. CBC: Recent Labs  Lab 10/14/17 0340 10/15/17 0333 10/16/17 0653 10/17/17 0430  WBC 12.7* 11.5* 14.6* 16.0*  HGB 12.6* 13.3 13.8 14.1  HCT 37.6* 40.4 42.5 42.3  MCV 87.6 88.4 89.5 87.6  PLT 312 278 390 393    PT/INR: @LABRCNTIP (inr:5) Cardiac Enzymes: )No results for input(s): CKTOTAL, CKMB, CKMBINDEX, TROPONINI in the last 168 hours. CBG: Recent Labs  Lab 10/17/17 0728 10/17/17 1637 10/17/17 2142 10/18/17 0624 10/18/17 1104  GLUCAP 223* 238* 196* 236* 210*    Iron Studies: No results for input(s): IRON, TIBC, TRANSFERRIN, FERRITIN in the last 168 hours.  Xrays/Other Studies: No results found.   Assessment/Plan: 1.  AKI- in setting of worsening ascites and s/p multiple large volume paracenteses.  Low FeNa  consistent with possible hepatorenal but could also be related to intravascular volume depletion following paracenteses and low albumin.  Also a component of BOO cannot be ruled out given his symptoms of retention and hesitancy.  Agree with holding aldactone.  Will order foley cath to be placed (bladder scan useless in setting of massive ascites), and will add octreotide and midodrine given AKI and hypotension and possible hepatorenal syndrome.  No indication for HD at this time but if continues to worsen, will need to discuss goals/limits of care as this may be related to malignant ascites.  continue to follow UOP and Cr. 2. STEMI- s/p PCI and DES in LAD.  Stable and on dual antiplatelet therapy. 3. Ascites/cirrhosis/abdominal mass- sudden onset following cardiac cath/cardiogenic shock.  ?portal HTN vs malignant ascites.  Cytology pending and GI following.   4. Hyponatremia due to Ascites  5. Hyperkalemia- due to #1 and off of aldactone.  Will try IV lasix and veltassa and follow.  This is also related to intravascular volume depletion and inability for distal sodium delivery.  Can't give IVF's given significant ascites. 6. Severe protein malnutrition on supplements 7. DM- per primary   [place] 10/18/2017, 3:51 PM

## 2017-10-19 LAB — COMPREHENSIVE METABOLIC PANEL
ALK PHOS: 66 U/L (ref 38–126)
ALT: 62 U/L — ABNORMAL HIGH (ref 0–44)
ANION GAP: 11 (ref 5–15)
AST: 62 U/L — ABNORMAL HIGH (ref 15–41)
Albumin: 2.9 g/dL — ABNORMAL LOW (ref 3.5–5.0)
BILIRUBIN TOTAL: 1.1 mg/dL (ref 0.3–1.2)
BUN: 76 mg/dL — ABNORMAL HIGH (ref 8–23)
CALCIUM: 9.4 mg/dL (ref 8.9–10.3)
CO2: 23 mmol/L (ref 22–32)
Chloride: 89 mmol/L — ABNORMAL LOW (ref 98–111)
Creatinine, Ser: 2.09 mg/dL — ABNORMAL HIGH (ref 0.61–1.24)
GFR calc non Af Amer: 31 mL/min — ABNORMAL LOW (ref >60)
GFR, EST AFRICAN AMERICAN: 36 mL/min — AB (ref >60)
Glucose, Bld: 191 mg/dL — ABNORMAL HIGH (ref 70–99)
Potassium: 5.8 mmol/L — ABNORMAL HIGH (ref 3.5–5.1)
Sodium: 123 mmol/L — ABNORMAL LOW (ref 135–145)
TOTAL PROTEIN: 5.6 g/dL — AB (ref 6.5–8.1)

## 2017-10-19 LAB — CBC
HEMATOCRIT: 40.6 % (ref 39.0–52.0)
HEMOGLOBIN: 13.7 g/dL (ref 13.0–17.0)
MCH: 29.3 pg (ref 26.0–34.0)
MCHC: 33.7 g/dL (ref 30.0–36.0)
MCV: 86.9 fL (ref 78.0–100.0)
PLATELETS: 405 10*3/uL — AB (ref 150–400)
RBC: 4.67 MIL/uL (ref 4.22–5.81)
RDW: 14.5 % (ref 11.5–15.5)
WBC: 17 10*3/uL — AB (ref 4.0–10.5)

## 2017-10-19 LAB — GLUCOSE, CAPILLARY
GLUCOSE-CAPILLARY: 176 mg/dL — AB (ref 70–99)
GLUCOSE-CAPILLARY: 210 mg/dL — AB (ref 70–99)
GLUCOSE-CAPILLARY: 262 mg/dL — AB (ref 70–99)
GLUCOSE-CAPILLARY: 220 mg/dL — AB (ref 70–99)

## 2017-10-19 MED ORDER — PATIROMER SORBITEX CALCIUM 8.4 G PO PACK
8.4000 g | PACK | Freq: Once | ORAL | Status: AC
  Administered 2017-10-19: 8.4 g via ORAL
  Filled 2017-10-19: qty 1

## 2017-10-19 MED ORDER — FUROSEMIDE 10 MG/ML IJ SOLN
80.0000 mg | Freq: Two times a day (BID) | INTRAMUSCULAR | Status: DC
  Administered 2017-10-19 (×2): 80 mg via INTRAVENOUS
  Filled 2017-10-19 (×2): qty 8

## 2017-10-19 MED ORDER — ALBUMIN HUMAN 25 % IV SOLN
25.0000 g | Freq: Every day | INTRAVENOUS | Status: DC
  Administered 2017-10-21 – 2017-10-22 (×2): 25 g via INTRAVENOUS
  Filled 2017-10-19 (×4): qty 100

## 2017-10-19 MED ORDER — DIAZEPAM 2 MG PO TABS
2.0000 mg | ORAL_TABLET | Freq: Two times a day (BID) | ORAL | Status: DC | PRN
  Administered 2017-10-20 – 2017-10-21 (×2): 2 mg via ORAL
  Filled 2017-10-19 (×2): qty 1

## 2017-10-19 MED ORDER — INSULIN ASPART 100 UNIT/ML ~~LOC~~ SOLN
10.0000 [IU] | Freq: Three times a day (TID) | SUBCUTANEOUS | Status: DC
  Administered 2017-10-21: 10 [IU] via SUBCUTANEOUS

## 2017-10-19 MED ORDER — PATIROMER SORBITEX CALCIUM 8.4 G PO PACK
16.8000 g | PACK | Freq: Every day | ORAL | Status: DC
  Filled 2017-10-19: qty 2

## 2017-10-19 MED ORDER — INSULIN DETEMIR 100 UNIT/ML ~~LOC~~ SOLN
22.0000 [IU] | Freq: Every day | SUBCUTANEOUS | Status: DC
  Administered 2017-10-19 – 2017-10-23 (×5): 22 [IU] via SUBCUTANEOUS
  Filled 2017-10-19 (×6): qty 0.22

## 2017-10-19 MED ORDER — ALBUMIN HUMAN 25 % IV SOLN
50.0000 g | Freq: Every day | INTRAVENOUS | Status: AC
  Administered 2017-10-19 – 2017-10-20 (×2): 50 g via INTRAVENOUS
  Filled 2017-10-19 (×2): qty 200

## 2017-10-19 NOTE — Progress Notes (Signed)
Physical Therapy Treatment Patient Details Name: Daniel Rivers MRN: [id number] DOB: 12/30/1950 Today's Date: 10/19/2017    History of Present Illness Pt is a 67 y.o. male admitted 10/07/2017 for a STEMI requiring a stent placement. Subsequently developed abdominal pain and distention and was found to have ascites, GI was consulted. Pt with ascites s/p paracentesis 9/6 and 9/12 (removed 6.4 liters). PMH includes ankylosing spondylitis, anxiety, chronic back pain, CAD, obesity, nonalcoholic cirrhosis, DM2.    PT Comments    Pt admitted with above diagnosis. Pt currently with functional limitations due to balance and endurance deficits. Pt was able to stand and pivot with RW to recliner from bed with +2 mod assist today.  Pt with decr BP immediately after pt in chair but did improve to 105/73.  Pt progression with incr participation from yesterday to today.  May been SNF at d/c unless he has 24 hour care as he is requiring +2 assist to get OOB and on his feet. Will continue acute PT.  Pt will benefit from skilled PT to increase their independence and safety with mobility to allow discharge to the venue listed below.     Follow Up Recommendations  Supervision/Assistance - 24 hour;SNF(Feel that pt may need SNF unless he has 24 hour care)     Equipment Recommendations  Rolling walker with 5" wheels(if pt can't locate his )    Recommendations for Other Services       Precautions / Restrictions Precautions Precautions: Fall Restrictions Weight Bearing Restrictions: No    Mobility  Bed Mobility Overal bed mobility: Needs Assistance Bed Mobility: Supine to Sit     Supine to sit: HOB elevated;Mod assist;+2 for physical assistance     General bed mobility comments: Pt lethargic.    BP soft as well at 98/65 at rest in supine.  Pt was able to come to EOB with min assist for LEs and then mod assist of 2 to come to EOB.  Incr time to get pt sitting without mod assist as he had difficulty finding  midline and was leaning posteriorly and to right.  Assisted pt to left elbow and pt finally was able to sit with min guard assist in midline with head and neck still flexed.   Transfers Overall transfer level: Needs assistance Equipment used: Rolling walker (2 wheeled) Transfers: Sit to/from Stand Sit to Stand: Mod assist;+2 physical assistance         General transfer comment: Mod A for power up into standing and then min assist to gain balance. Pt reliant on UE support at RW  Pt also not able to stand fully upright.  Needed max cues and mod assist throughout transfer to chair.    Ambulation/Gait                 Stairs             Wheelchair Mobility    Modified Rankin (Stroke Patients Only)       Balance Overall balance assessment: Needs assistance Sitting-balance support: Feet supported;Bilateral upper extremity supported Sitting balance-Leahy Scale: Poor Sitting balance - Comments: Needed mod assist initially to stand and then progressed to min guard assist.    Standing balance support: Bilateral upper extremity supported Standing balance-Leahy Scale: Poor Standing balance comment: Pt reliant on UE support and requiring Mod A for standing  Cognition Arousal/Alertness: Awake/alert Behavior During Therapy: WFL for tasks assessed/performed Overall Cognitive Status: Impaired/Different from baseline Area of Impairment: Memory;Following commands;Safety/judgement;Awareness;Problem solving;Attention                   Current Attention Level: Sustained Memory: Decreased short-term memory Following Commands: Follows one step commands with increased time Safety/Judgement: Decreased awareness of safety Awareness: Intellectual Problem Solving: Slow processing;Requires verbal cues;Decreased initiation;Difficulty sequencing;Requires tactile cues General Comments: Pt with slow processing and requiring increased time and  cues throughout session.       Exercises General Exercises - Lower Extremity Ankle Circles/Pumps: AAROM;Both;10 reps;Supine Long Arc Quad: AROM;Both;10 reps;Seated    General Comments General comments (skin integrity, edema, etc.): Once in chair, BP was 77/65, reclined pt and BP 105/73.        Pertinent Vitals/Pain Pain Assessment: Faces Faces Pain Scale: Hurts even more Pain Location: Bilateral hips Pain Descriptors / Indicators: Constant;Discomfort;Grimacing Pain Intervention(s): Limited activity within patient's tolerance;Monitored during session;Repositioned    Home Living                      Prior Function            PT Goals (current goals can now be found in the care plan section) Acute Rehab PT Goals Patient Stated Goal: return home Progress towards PT goals: Progressing toward goals    Frequency    Min 3X/week      PT Plan Discharge plan needs to be updated    Co-evaluation              AM-PAC PT "6 Clicks" Daily Activity  Outcome Measure  Difficulty turning over in bed (including adjusting bedclothes, sheets and blankets)?: Unable Difficulty moving from lying on back to sitting on the side of the bed? : Unable Difficulty sitting down on and standing up from a chair with arms (e.g., wheelchair, bedside commode, etc,.)?: Unable Help needed moving to and from a bed to chair (including a wheelchair)?: A Lot Help needed walking in hospital room?: Total Help needed climbing 3-5 steps with a railing? : Total 6 Click Score: 7    End of Session Equipment Utilized During Treatment: Gait belt;Oxygen Activity Tolerance: Patient limited by fatigue Patient left: with call bell/phone within reach;in chair;with chair alarm set;with family/visitor present Nurse Communication: Mobility status PT Visit Diagnosis: Unsteadiness on feet (R26.81);Muscle weakness (generalized) (M62.81);Other abnormalities of gait and mobility (R26.89)     Time:  3845-3646 PT Time Calculation (min) (ACUTE ONLY): 21 min  Charges:  $Therapeutic Activity: 8-22 mins                     [place] Pager:  [phone]  Office:  [place] 10/19/2017, 10:41 AM

## 2017-10-19 NOTE — Progress Notes (Signed)
S: Still feels very uncomfortable O:BP 107/83 (BP Location: Right Arm)   Pulse (!) 116   Temp 97.6 F (36.4 C) (Oral)   Resp 17   Ht 6\' 4"  (1.93 m)   Wt 119.2 kg   SpO2 96%   BMI 31.99 kg/m   Intake/Output Summary (Last 24 hours) at 10/19/2017 1210 Last data filed at 10/18/2017 2357 Gross per 24 hour  Intake -  Output 475 ml  Net -475 ml   Intake/Output: I/O last 3 completed shifts: In: -  Out: 476 [Urine:475; Emesis/NG output:1]  Intake/Output this shift:  No intake/output data recorded. Weight change: 0 kg Gen: WM who is fatigued and in mild distress [id number] at 116, no rub Resp: decreased BS at bases Abd: markedly distended with + fluid wave Ext: 1+ edema  Recent Labs  Lab 10/13/17 0315 10/14/17 0340 10/15/17 0333 10/16/17 0653 10/17/17 0430 10/18/17 0419 10/19/17 0306  NA 127* 127* 129* 126* 124* 124* 123*  K 4.6 4.7 4.8 5.4* 5.1 5.2* 5.8*  CL 93* 94* 98 95* 90* 91* 89*  CO2 21* 22 19* 21* 20* 21* 23  GLUCOSE 206* 158* 138* 241* 246* 230* 191*  BUN 37* 41* 33* 45* 57* 66* 76*  CREATININE 1.13 1.04 0.96 1.69* 1.77* 1.81* 2.09*  ALBUMIN 3.2* 2.9* 3.0* 2.8* 2.7* 3.0* 2.9*  CALCIUM 9.1 8.6* 8.4* 8.8* 9.1 9.6 9.4  PHOS 4.6  --   --   --   --   --   --   AST 80* 81* 57* 42* 54* 73* 62*  ALT 72* 76* 57* 48* 49* 62* 62*   Liver Function Tests: Recent Labs  Lab 10/17/17 0430 10/18/17 0419 10/19/17 0306  AST 54* 73* 62*  ALT 49* 62* 62*  ALKPHOS 59 64 66  BILITOT 1.1 1.2 1.1  PROT 5.2* 5.6* 5.6*  ALBUMIN 2.7* 3.0* 2.9*   No results for input(s): LIPASE, AMYLASE in the last 168 hours. No results for input(s): AMMONIA in the last 168 hours. CBC: Recent Labs  Lab 10/14/17 0340 10/15/17 0333 10/16/17 0653 10/17/17 0430 10/19/17 0306  WBC 12.7* 11.5* 14.6* 16.0* 17.0*  HGB 12.6* 13.3 13.8 14.1 13.7  HCT 37.6* 40.4 42.5 42.3 40.6  MCV 87.6 88.4 89.5 87.6 86.9  PLT 312 278 390 393 405*   Cardiac Enzymes: No results for input(s): CKTOTAL, CKMB,  CKMBINDEX, TROPONINI in the last 168 hours. CBG: Recent Labs  Lab 10/17/17 2142 10/18/17 0624 10/18/17 1104 10/18/17 1609 10/19/17 0555  GLUCAP 196* 236* 210* 174* 210*    Iron Studies: No results for input(s): IRON, TIBC, TRANSFERRIN, FERRITIN in the last 72 hours. Studies/Results: No results found. [name] aspirin EC  81 mg Oral Daily  . buPROPion  300 mg Oral Daily  . feeding supplement (GLUCERNA SHAKE)  237 mL Oral BID BM  . feeding supplement (PRO-STAT SUGAR FREE 64)  30 mL Oral BID  . furosemide  80 mg Intravenous BID  . heparin injection (subcutaneous)  5,000 Units Subcutaneous Q8H  . insulin aspart  0-15 Units Subcutaneous TID WC  . insulin aspart  8 Units Subcutaneous TID WC  . insulin detemir  20 Units Subcutaneous QHS  . midodrine  10 mg Oral TID WC  . naloxegol oxalate  12.5 mg Oral Daily  . octreotide  100 mcg Subcutaneous TID  . [START ON 10/20/2017] patiromer  16.8 g Oral Daily  . polyethylene glycol  17 g Oral BID  . prasugrel  10 mg Oral Daily  .  psyllium  1 packet Oral BID  . senna-docusate  1 tablet Oral BID  . zolpidem  5 mg Oral QHS    BMET    Component Value Date/Time   NA 123 (L) 10/19/2017 0306   NA 138 12/11/2016 0931   K 5.8 (H) 10/19/2017 0306   K 4.1 12/11/2016 0931   CL 89 (L) 10/19/2017 0306   CO2 23 10/19/2017 0306   CO2 28 12/11/2016 0931   GLUCOSE 191 (H) 10/19/2017 0306   GLUCOSE 128 12/11/2016 0931   BUN 76 (H) 10/19/2017 0306   BUN 13.1 12/11/2016 0931   CREATININE 2.09 (H) 10/19/2017 0306   CREATININE 0.89 09/16/2017 1118   CREATININE 0.8 12/11/2016 0931   CALCIUM 9.4 10/19/2017 0306   CALCIUM 9.6 12/11/2016 0931   GFRNONAA 31 (L) 10/19/2017 0306   GFRNONAA >60 09/16/2017 1118   GFRAA 36 (L) 10/19/2017 0306   GFRAA >60 09/16/2017 1118   CBC    Component Value Date/Time   WBC 17.0 (H) 10/19/2017 0306   RBC 4.67 10/19/2017 0306   HGB 13.7 10/19/2017 0306   HGB 13.8 09/16/2017 1118   HGB 13.4 12/11/2016 0931   HCT 40.6  10/19/2017 0306   HCT 40.9 12/11/2016 0931   PLT 405 (H) 10/19/2017 0306   PLT 132 (L) 09/16/2017 1118   PLT 116 (L) 12/11/2016 0931   MCV 86.9 10/19/2017 0306   MCV 89.5 12/11/2016 0931   MCH 29.3 10/19/2017 0306   MCHC 33.7 10/19/2017 0306   RDW 14.5 10/19/2017 0306   RDW 14.3 12/11/2016 0931   LYMPHSABS 2.1 10/06/2017 0331   LYMPHSABS 1.1 12/11/2016 0931   MONOABS 1.1 (H) 10/06/2017 0331   MONOABS 0.4 12/11/2016 0931   EOSABS 0.1 10/06/2017 0331   EOSABS 0.1 12/11/2016 0931   BASOSABS 0.1 10/06/2017 0331   BASOSABS 0.0 12/11/2016 0931     Assessment/Plan: 1.  AKI- in setting of worsening ascites and s/p multiple large volume paracenteses.  Low FeNa consistent with possible hepatorenal but could also be related to intravascular volume depletion following paracenteses and low albumin.  Also a component of BOO cannot be ruled out given his symptoms of retention and hesitancy.  Agree with holding aldactone.   1. foley cath placed 10/18/17 with 300 cc's of urine 2. octreotide and midodrine started 10/18/17 given AKI and hypotension and possible hepatorenal syndrome.   3. Will hold off on HD at this time but if continues to worsen, will need to discuss goals/limits of care as this may be related to malignant ascites or hepatorenal syndrome (both with poor prognosis and limited treatment options).   4. continue to follow UOP and Cr. 2. STEMI- s/p PCI and DES in LAD.  Stable and on dual antiplatelet therapy. 3. Ascites/cirrhosis/abdominal mass- sudden onset following cardiac cath/cardiogenic shock.  ?portal HTN vs malignant ascites.  Cytology pending and GI following.   4. Hyponatremia due to Ascites  5. Hyperkalemia- due to #1 and off of aldactone.  Will try IV lasix and veltassa and follow.  This is also related to intravascular volume depletion and inability for distal sodium delivery.  Can't give IVF's given significant ascites. 6. Severe protein malnutrition on supplements 7. DM- per  primary 8. Disposition- poor prognosis if this is malignant ascites or hepatorenal syndrome.  Recommend palliative care consult to help set goals/limits of care.   Donetta Potts, MD [employer] [phone]

## 2017-10-19 NOTE — Progress Notes (Signed)
Subjective: Feeling uncomfortable.  Objective: Vital signs in last 24 hours: Temp:  [97.5 F (36.4 C)-97.6 F (36.4 C)] 97.6 F (36.4 C) (09/17 0409) Pulse Rate:  [113-117] 116 (09/17 0700) Resp:  [13-20] 20 (09/17 1300) BP: (97-117)/(64-83) 117/80 (09/17 1300) SpO2:  [93 %-96 %] 96 % (09/17 0700) Weight:  [937.3 kg] 119.2 kg (09/17 0409) Last BM Date: 10/18/17  Intake/Output from previous day: 09/16 0701 - 09/17 0700 In: -  Out: 475 [Urine:475] Intake/Output this shift: Total I/O In: 420 [P.O.:120; Other:300] Out: -   General appearance: fatigued and uncomfortable GI: distended, tight  Lab Results: Recent Labs    10/17/17 0430 10/19/17 0306  WBC 16.0* 17.0*  HGB 14.1 13.7  HCT 42.3 40.6  PLT 393 405*   BMET Recent Labs    10/17/17 0430 10/18/17 0419 10/19/17 0306  NA 124* 124* 123*  K 5.1 5.2* 5.8*  CL 90* 91* 89*  CO2 20* 21* 23  GLUCOSE 246* 230* 191*  BUN 57* 66* 76*  CREATININE 1.77* 1.81* 2.09*  CALCIUM 9.1 9.6 9.4   LFT Recent Labs    10/19/17 0306  PROT 5.6*  ALBUMIN 2.9*  AST 62*  ALT 62*  ALKPHOS 66  BILITOT 1.1   PT/INR No results for input(s): LABPROT, INR in the last 72 hours. Hepatitis Panel No results for input(s): HEPBSAG, HCVAB, HEPAIGM, HEPBIGM in the last 72 hours. C-Diff No results for input(s): CDIFFTOX in the last 72 hours. Fecal Lactopherrin No results for input(s): FECLLACTOFRN in the last 72 hours.  Studies/Results: No results found.  Medications:  Scheduled: . aspirin EC  81 mg Oral Daily  . buPROPion  300 mg Oral Daily  . feeding supplement (GLUCERNA SHAKE)  237 mL Oral BID BM  . feeding supplement (PRO-STAT SUGAR FREE 64)  30 mL Oral BID  . furosemide  80 mg Intravenous BID  . heparin injection (subcutaneous)  5,000 Units Subcutaneous Q8H  . insulin aspart  0-15 Units Subcutaneous TID WC  . insulin aspart  8 Units Subcutaneous TID WC  . insulin detemir  20 Units Subcutaneous QHS  . midodrine  10 mg Oral  TID WC  . naloxegol oxalate  12.5 mg Oral Daily  . octreotide  100 mcg Subcutaneous TID  . [START ON 10/20/2017] patiromer  16.8 g Oral Daily  . polyethylene glycol  17 g Oral BID  . prasugrel  10 mg Oral Daily  . psyllium  1 packet Oral BID  . senna-docusate  1 tablet Oral BID  . zolpidem  5 mg Oral QHS   Continuous:   Assessment/Plan: 1) Recurrent ascites - Unclear if this is from portal HTN versus malignant. 2) Renal failure - AKI versus HRS. 3) Mesenteric mass. 4) S/p STEMI.   Clinically the patient is declining.  The rapid accumulation of ascites after paracenteses is disconcerting for the potential of a malignant process.  The cytology is still pending from the paracentesis last week.  His creatinine is increasing and Renal is following.  Plan: 1) Agree with octreotide and midodrine. 2) He may benefit with another paracentesis for comfort. 3) Await cytology. 4) Palliative care consultation is appropriate.  LOS: 14 days   Yassmine Tamm D 10/19/2017, 2:07 PM

## 2017-10-19 NOTE — Progress Notes (Signed)
[place] TEAM 1 - Stepdown/ICU TEAM  CAL GINDLESPERGER  TLX:[id number] DOB: Jul 18, 1950 DOA: 10/08/2017 PCP: [name], MD    Brief Narrative:  67 year old M w/ a hx of ankylosing spondylitis, anxiety, BPH, chronic back pain, CAD, GERD, dyslipidemia, obesity, nonalcoholic cirrhosis, hemochromatosis, and DM2 who was admitted by Cardiology on 10/12/2017 for a STEMI requiring a stent placement. Subsequently he developed abdominal pain and distention and was found to have ascites. GI was consulted. The patient was transferred to the [facility] service on 10/09/2017 for management of his medical issues.  The patient has recurrent ascites, and is hypotensive and tachycardic.  Significant Events: 10/04/2017 - Left heart catheterization with LAD DES placement  10/13/2017 - TTE -  EF 35% to 40%.Severe hypokinesis of the anteroseptal and apical myocardium - grade 2 diastolic dysfunction 08/06/14 [place] - Cirrhosis of the liver - small amount of abdominal ascites - 1.4 cm hypoechoic lesion upper pole left kidney, complex cystic versus small solid nodule 10/17/2017  CTchest  No pulmonary emboli 10/08/17 TTE - mild LVH - EF 45% - akinesis of the apical septal wall, the apicalanterior wall, and the true apex 10/08/17 -  Ultrasound-guided paracentesis by IR yielding 1 L of fluid.  Neutrophil count close to 200, SAAG score cannot be calculated as there was no serum albumin from that day. 10/08/17 CT potential mass lesion in the central mesentery positioned between the SMV and second portion duodenum. Consider endoscopic ultrasound (EUS) evaluation and tumor sampling. FDG PET scan may be beneficial on outpatient basis.  10/11/2017 Repeat paracentesis not done due to "no ascites on [place]" 10/13/2017 CT stat ordered, study completed 10/14/2017 showing large ascites. 10/14/2017 repeat paracentesis 10/16/2017 repeat paracentesis - 5.1L  Subjective: The pt is feeling very sob today, w/ rapid reaccumulation of his ascites/futher abdom distention. He denies cp,  n/v, or HA, but is very uncomfortable due to his ascites.    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 had a therapeutic and diagnostic paracentesis 9/6 with 1 L of fluid removed - was to have repeat paracentesis 9/9 but erroneously "no fluid was found on [place]" - CT abdom confirmed large volume ascites - s/p repeat large volume paracentesis 9/12 and again on 9/14 - intolerant to diuretic as it rapidly worsens renal failure - now on midodrine + octreotide - will dose w/ scheduled albumin - ordered repeat paracentesis today for comfort and will send for cytology/diagnostic testing again - the rate of re-accumulation and the fact that this is proving refractory to medical tx is suggestive this may be malignancy related as opposed to simply due to cirrhosis   Filed Weights   10/17/17 0348 10/18/17 0500 10/19/17 0409  Weight: 117.6 kg 119.2 kg 119.2 kg    Hypotension, tachycardia severely intravascularly dehydrated, but complicated by marked ascites - volume balance proving very difficult - presently remains intravascularly dry - cont midodrine   Acute kidney injury ?hepatorenal picture now at play - crt steadily climbing - pt hypotensive, and yet grossly volume overloaded - Nephrology now following   Recent Labs  Lab 10/15/17 0333 10/16/17 0653 10/17/17 0430 10/18/17 0419 10/19/17 0306  CREATININE 0.96 1.69* 1.77* 1.81* 2.09*    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 Noted on CT abdom 10/14/17, and previously on scan 10/04/2017 - no prior CT or MRI imaging of abdom since 2014 - it is  becoming more likely that this is a malignant lesion, and that it is likely the etiology of his recalcitrant ascites - send peritoneal fluid for cytology again today, but yield low for such sampling   STEMI caused by LAD lesion requiring DES placement this  admission - on dual antiplatelet therapy - asymptomatic at this time - this was the initial reason for his hospitalization   Hyponatremia  Due to above - Na is holding steady    Hyperkalemia Due to AKI + aldactone - stopped alactone - K+ trending upward - tx as per Nephrology  Moderate to severe protein calorie malnutrition Cont Protostat  Dyslipidemia Cont statin   Left upper pole kidney cyst Outpatient PCP follow-up  DM2 CBG not yet at goal - adjust tx again today and follow   MRSA screen +  DVT prophylaxis: Guy heparin  Code Status: FULL CODE Family Communication: extended discussion w/ son in hallway w/ permission from pt  Disposition Plan: SDU  Consultants:  GI PCCM Cardiology  Nephrology   Antimicrobials:  Rocephin 9/6 > 9/8  Objective: Blood pressure 117/80, pulse (!) 116, temperature 97.6 F (36.4 C), temperature source Oral, resp. rate 20, height 6\' 4"  (1.93 m), weight 119.2 kg, SpO2 96 %.  Intake/Output Summary (Last 24 hours) at 10/19/2017 1455 Last data filed at 10/19/2017 1300 Gross per 24 hour  Intake 420 ml  Output 475 ml  Net -55 ml   Filed Weights   10/17/17 0348 10/18/17 0500 10/19/17 0409  Weight: 117.6 kg 119.2 kg 119.2 kg    Examination: General: uncomfortable respirations - mild distress/tachypnea  Lungs: poor air movement B bases - no wheezing  Cardiovascular: tachycardic - regular  Abdomen: ascites has worsened yet again, no rebound, BS hypoactive, no mass Extremities: 2+ B LE edema persists   CBC: Recent Labs  Lab 10/16/17 0653 10/17/17 0430 10/19/17 0306  WBC 14.6* 16.0* 17.0*  HGB 13.8 14.1 13.7  HCT 42.5 42.3 40.6  MCV 89.5 87.6 86.9  PLT 390 393 578*   Basic Metabolic Panel: Recent Labs  Lab 10/13/17 0315 10/14/17 0340 10/15/17 0333  10/17/17 0430 10/18/17 0419 10/19/17 0306  NA 127* 127* 129*   < > 124* 124* 123*  K 4.6 4.7 4.8   < > 5.1 5.2* 5.8*  CL 93* 94* 98   < > 90* 91* 89*  CO2 21* 22 19*   < >  20* 21* 23  GLUCOSE 206* 158* 138*   < > 246* 230* 191*  BUN 37* 41* 33*   < > 57* 66* 76*  CREATININE 1.13 1.04 0.96   < > 1.77* 1.81* 2.09*  CALCIUM 9.1 8.6* 8.4*   < > 9.1 9.6 9.4  MG 2.1 2.1 2.0  --   --   --   --   PHOS 4.6  --   --   --   --   --   --    < > = values in this interval not displayed.   GFR: Estimated Creatinine Clearance: 48.4 mL/min (A) (by C-G formula based on SCr of 2.09 mg/dL (H)).  Liver Function Tests: Recent Labs  Lab 10/16/17 0653 10/17/17 0430 10/18/17 0419 10/19/17 0306  AST 42* 54* 73* 62*  ALT 48* 49* 62* 62*  ALKPHOS 55 59 64 66  BILITOT 1.1 1.1 1.2 1.1  PROT 5.5* 5.2* 5.6* 5.6*  ALBUMIN 2.8* 2.7* 3.0* 2.9*    Coagulation Profile: Recent Labs  Lab 10/16/17 0653  INR 1.22  HbA1C: Hgb A1c MFr Bld  Date/Time Value Ref Range Status  10/06/2017 03:31 AM 6.9 (H) 4.8 - 5.6 % Final    Comment:    (NOTE) Pre diabetes:          5.7%-6.4% Diabetes:              >6.4% Glycemic control for   <7.0% adults with diabetes   05/22/2014 03:10 PM 6.5 (H) 4.8 - 5.6 % Final    Comment:    (NOTE)         Pre-diabetes: 5.7 - 6.4         Diabetes: >6.4         Glycemic control for adults with diabetes: <7.0     CBG: Recent Labs  Lab 10/18/17 0624 10/18/17 1104 10/18/17 1609 10/19/17 0555 10/19/17 1208  GLUCAP 236* 210* 174* 210* 262*    Recent Results (from the past 240 hour(s))  Culture, Urine     Status: None   Collection Time: 10/09/17  7:47 PM  Result Value Ref Range Status   Specimen Description URINE, RANDOM  Final   Special Requests NONE  Final   Culture   Final    NO GROWTH Performed at [facility], [place] [address], [place], [place] [address]    Report Status 10/10/2017 FINAL  Final  Gram stain     Status: None   Collection Time: 10/13/17 12:44 PM  Result Value Ref Range Status   Specimen Description PLEURAL RIGHT  Final   Special Requests NONE  Final   Gram Stain   Final    RARE WBC PRESENT, PREDOMINANTLY  PMN NO ORGANISMS SEEN Performed at [facility], [place] [address], [place], [place] [address]    Report Status 10/13/2017 FINAL  Final  Culture, body fluid-bottle     Status: None   Collection Time: 10/13/17 12:44 PM  Result Value Ref Range Status   Specimen Description PLEURAL RIGHT  Final   Special Requests NONE  Final   Culture   Final    NO GROWTH 5 DAYS Performed at [facility] [address], [place], [place] [address]    Report Status 10/18/2017 FINAL  Final     Scheduled Meds: . aspirin EC  81 mg Oral Daily  . buPROPion  300 mg Oral Daily  . feeding supplement (GLUCERNA SHAKE)  237 mL Oral BID BM  . feeding supplement (PRO-STAT SUGAR FREE 64)  30 mL Oral BID  . furosemide  80 mg Intravenous BID  . heparin injection (subcutaneous)  5,000 Units Subcutaneous Q8H  . insulin aspart  0-15 Units Subcutaneous TID WC  . insulin aspart  8 Units Subcutaneous TID WC  . insulin detemir  20 Units Subcutaneous QHS  . midodrine  10 mg Oral TID WC  . naloxegol oxalate  12.5 mg Oral Daily  . octreotide  100 mcg Subcutaneous TID  . [START ON 10/20/2017] patiromer  16.8 g Oral Daily  . polyethylene glycol  17 g Oral BID  . prasugrel  10 mg Oral Daily  . psyllium  1 packet Oral BID  . senna-docusate  1 tablet Oral BID  . zolpidem  5 mg Oral QHS     LOS: 14 days   Cherene Altes, MD [facility] Office  [phone] Pager - Text Page per Amion  If 7PM-7AM, please contact night-coverage per Amion 10/19/2017, 2:55 PM

## 2017-10-19 NOTE — Progress Notes (Signed)
   [place] TEAM 1 - Stepdown/ICU TEAM  I returned to the patient's room after my daily visit today.  I confirmed that he was alert and oriented to place time and situation.  I discussed the difficult clinical situation he was in and the fact that thus far he was not responding to our medical interventions.  I discussed the finding of the intra-abdominal mass on the CT scan and the concern that his refractory ascites may be indicative of a GI malignancy.  I discussed his overall fragile state to include his persistent hypotension and his worsening renal function.  I was very frank with him and told him that I was not sure if he was going to survive his current illness.  I asked him if he would wish to be resuscitated in the event of a sudden respiratory or cardiac arrest.  He made it very clear to me that he would not wish to undergo resuscitation in such a situation.  He also made it very clear to me that he does however wish to continue to undergo aggressive medical interventions short of that at this time.  Cherene Altes, MD [facility] Office  [phone] Pager - Text Page per Amion as per below:  On-Call/Text Page:      [email or web address]      password [email or web address]  If 7PM-7AM, please contact night-coverage [email or web address] Password [facility] 10/19/2017, 6:38 PM

## 2017-10-20 ENCOUNTER — Inpatient Hospital Stay (HOSPITAL_COMMUNITY): Payer: Medicare Other

## 2017-10-20 LAB — CBC
HCT: 37.2 % — ABNORMAL LOW (ref 39.0–52.0)
Hemoglobin: 12.5 g/dL — ABNORMAL LOW (ref 13.0–17.0)
MCH: 29.1 pg (ref 26.0–34.0)
MCHC: 33.6 g/dL (ref 30.0–36.0)
MCV: 86.7 fL (ref 78.0–100.0)
PLATELETS: 342 10*3/uL (ref 150–400)
RBC: 4.29 MIL/uL (ref 4.22–5.81)
RDW: 14.6 % (ref 11.5–15.5)
WBC: 15.4 10*3/uL — AB (ref 4.0–10.5)

## 2017-10-20 LAB — MAGNESIUM: Magnesium: 2.6 mg/dL — ABNORMAL HIGH (ref 1.7–2.4)

## 2017-10-20 LAB — COMPREHENSIVE METABOLIC PANEL
ALT: 43 U/L (ref 0–44)
AST: 44 U/L — AB (ref 15–41)
Albumin: 3.4 g/dL — ABNORMAL LOW (ref 3.5–5.0)
Alkaline Phosphatase: 58 U/L (ref 38–126)
Anion gap: 12 (ref 5–15)
BILIRUBIN TOTAL: 1.4 mg/dL — AB (ref 0.3–1.2)
BUN: 83 mg/dL — AB (ref 8–23)
CO2: 23 mmol/L (ref 22–32)
CREATININE: 2.11 mg/dL — AB (ref 0.61–1.24)
Calcium: 9.9 mg/dL (ref 8.9–10.3)
Chloride: 87 mmol/L — ABNORMAL LOW (ref 98–111)
GFR calc non Af Amer: 31 mL/min — ABNORMAL LOW (ref >60)
GFR, EST AFRICAN AMERICAN: 36 mL/min — AB (ref >60)
Glucose, Bld: 185 mg/dL — ABNORMAL HIGH (ref 70–99)
POTASSIUM: 6 mmol/L — AB (ref 3.5–5.1)
Sodium: 122 mmol/L — ABNORMAL LOW (ref 135–145)
Total Protein: 6 g/dL — ABNORMAL LOW (ref 6.5–8.1)

## 2017-10-20 LAB — GLUCOSE, CAPILLARY
GLUCOSE-CAPILLARY: 173 mg/dL — AB (ref 70–99)
Glucose-Capillary: 210 mg/dL — ABNORMAL HIGH (ref 70–99)

## 2017-10-20 MED ORDER — PATIROMER SORBITEX CALCIUM 8.4 G PO PACK
25.2000 g | PACK | Freq: Every day | ORAL | Status: DC
  Administered 2017-10-20 – 2017-10-21 (×2): 25.2 g via ORAL
  Filled 2017-10-20 (×4): qty 3

## 2017-10-20 MED ORDER — FUROSEMIDE 10 MG/ML IJ SOLN
160.0000 mg | Freq: Three times a day (TID) | INTRAVENOUS | Status: DC
  Filled 2017-10-20 (×2): qty 16

## 2017-10-20 MED ORDER — LIDOCAINE HCL 1 % IJ SOLN
INTRAMUSCULAR | Status: AC
  Filled 2017-10-20: qty 20

## 2017-10-20 MED ORDER — FUROSEMIDE 10 MG/ML IJ SOLN
160.0000 mg | Freq: Four times a day (QID) | INTRAVENOUS | Status: DC
  Administered 2017-10-20 – 2017-10-23 (×14): 160 mg via INTRAVENOUS
  Filled 2017-10-20: qty 16
  Filled 2017-10-20: qty 10
  Filled 2017-10-20 (×2): qty 16
  Filled 2017-10-20: qty 10
  Filled 2017-10-20 (×4): qty 16
  Filled 2017-10-20: qty 10
  Filled 2017-10-20 (×6): qty 16
  Filled 2017-10-20: qty 10
  Filled 2017-10-20: qty 16

## 2017-10-20 NOTE — Progress Notes (Signed)
Patient brought to IR department for possible paracentesis.  Limited US Abdomen shows a trace amount of fluid reaccumulation since a total of 11 liters were removed last week.  No safe window for paracentesis.  No procedure performed.   Ordering service paged.   Brynda Greathouse, MS RD PA-C 10:09 AM

## 2017-10-20 NOTE — Progress Notes (Signed)
[id number] Called to room as I was walking by. Helped pt try to get more comfortable by positioning pillows. Will hold ambulation at this time as pt too weak. Will follow pt's progress with PT and begin seeing again when appropriate. Emotional support given. Graylon Good RN BSN 10/20/2017 10:37 AM

## 2017-10-20 NOTE — Progress Notes (Signed)
Clinical Social Worker following patient for support and any transitional needs. CSW met patients son Kasandra Knudsen and spoke to him about CSW role in care. At this time patient is still medically complex. CSW will continue to follow family and patient for needs.   Rhea Pink, MSW,  [place]

## 2017-10-20 NOTE — Progress Notes (Signed)
S: Feels uncomfortable today again due to foley and abdomen.  For paracentesis today. O:BP 122/76 (BP Location: Right Arm)   Pulse (!) 108   Temp 97.6 F (36.4 C) (Oral)   Resp (!) 22   Ht 6\' 4"  (1.93 m)   Wt 118.9 kg   SpO2 98%   BMI 31.91 kg/m   Intake/Output Summary (Last 24 hours) at 10/20/2017 0852 Last data filed at 10/20/2017 0507 Gross per 24 hour  Intake 1380 ml  Output 450 ml  Net 930 ml   Intake/Output: I/O last 3 completed shifts: In: 1520 [P.O.:420; Other:900; IV Piggyback:200] Out: 925 [Urine:925]  Intake/Output this shift:  No intake/output data recorded. Weight change: -0.3 kg Gen: fatigued white male in mild distress CVS: tachy at 108 Resp: decreased BS at bases Abd: markedly distended with + fluid wave and mild tenderness to palpation Ext: 1+ edema  Recent Labs  Lab 10/14/17 0340 10/15/17 0333 10/16/17 0653 10/17/17 0430 10/18/17 0419 10/19/17 0306 10/20/17 0433  NA 127* 129* 126* 124* 124* 123* 122*  K 4.7 4.8 5.4* 5.1 5.2* 5.8* 6.0*  CL 94* 98 95* 90* 91* 89* 87*  CO2 22 19* 21* 20* 21* 23 23  GLUCOSE 158* 138* 241* 246* 230* 191* 185*  BUN 41* 33* 45* 57* 66* 76* 83*  CREATININE 1.04 0.96 1.69* 1.77* 1.81* 2.09* 2.11*  ALBUMIN 2.9* 3.0* 2.8* 2.7* 3.0* 2.9* 3.4*  CALCIUM 8.6* 8.4* 8.8* 9.1 9.6 9.4 9.9  AST 81* 57* 42* 54* 73* 62* 44*  ALT 76* 57* 48* 49* 62* 62* 43   Liver Function Tests: Recent Labs  Lab 10/18/17 0419 10/19/17 0306 10/20/17 0433  AST 73* 62* 44*  ALT 62* 62* 43  ALKPHOS 64 66 58  BILITOT 1.2 1.1 1.4*  PROT 5.6* 5.6* 6.0*  ALBUMIN 3.0* 2.9* 3.4*   No results for input(s): LIPASE, AMYLASE in the last 168 hours. No results for input(s): AMMONIA in the last 168 hours. CBC: Recent Labs  Lab 10/15/17 0333 10/16/17 0653 10/17/17 0430 10/19/17 0306 10/20/17 0433  WBC 11.5* 14.6* 16.0* 17.0* 15.4*  HGB 13.3 13.8 14.1 13.7 12.5*  HCT 40.4 42.5 42.3 40.6 37.2*  MCV 88.4 89.5 87.6 86.9 86.7  PLT 278 390 393 405*  342   Cardiac Enzymes: No results for input(s): CKTOTAL, CKMB, CKMBINDEX, TROPONINI in the last 168 hours. CBG: Recent Labs  Lab 10/18/17 1609 10/18/17 2103 10/19/17 0555 10/19/17 1208 10/19/17 1635  GLUCAP 174* 176* 210* 262* 220*    Iron Studies: No results for input(s): IRON, TIBC, TRANSFERRIN, FERRITIN in the last 72 hours. Studies/Results: No results found. [name] aspirin EC  81 mg Oral Daily  . buPROPion  300 mg Oral Daily  . feeding supplement (GLUCERNA SHAKE)  237 mL Oral BID BM  . feeding supplement (PRO-STAT SUGAR FREE 64)  30 mL Oral BID  . furosemide  80 mg Intravenous BID  . heparin injection (subcutaneous)  5,000 Units Subcutaneous Q8H  . insulin aspart  0-15 Units Subcutaneous TID WC  . insulin aspart  10 Units Subcutaneous TID WC  . insulin detemir  22 Units Subcutaneous QHS  . midodrine  10 mg Oral TID WC  . naloxegol oxalate  12.5 mg Oral Daily  . octreotide  100 mcg Subcutaneous TID  . patiromer  16.8 g Oral Daily  . polyethylene glycol  17 g Oral BID  . prasugrel  10 mg Oral Daily  . psyllium  1 packet Oral BID  . senna-docusate  1 tablet Oral BID  . zolpidem  5 mg Oral QHS    BMET    Component Value Date/Time   NA 122 (L) 10/20/2017 0433   NA 138 12/11/2016 0931   K 6.0 (H) 10/20/2017 0433   K 4.1 12/11/2016 0931   CL 87 (L) 10/20/2017 0433   CO2 23 10/20/2017 0433   CO2 28 12/11/2016 0931   GLUCOSE 185 (H) 10/20/2017 0433   GLUCOSE 128 12/11/2016 0931   BUN 83 (H) 10/20/2017 0433   BUN 13.1 12/11/2016 0931   CREATININE 2.11 (H) 10/20/2017 0433   CREATININE 0.89 09/16/2017 1118   CREATININE 0.8 12/11/2016 0931   CALCIUM 9.9 10/20/2017 0433   CALCIUM 9.6 12/11/2016 0931   GFRNONAA 31 (L) 10/20/2017 0433   GFRNONAA >60 09/16/2017 1118   GFRAA 36 (L) 10/20/2017 0433   GFRAA >60 09/16/2017 1118   CBC    Component Value Date/Time   WBC 15.4 (H) 10/20/2017 0433   RBC 4.29 10/20/2017 0433   HGB 12.5 (L) 10/20/2017 0433   HGB 13.8 09/16/2017  1118   HGB 13.4 12/11/2016 0931   HCT 37.2 (L) 10/20/2017 0433   HCT 40.9 12/11/2016 0931   PLT 342 10/20/2017 0433   PLT 132 (L) 09/16/2017 1118   PLT 116 (L) 12/11/2016 0931   MCV 86.7 10/20/2017 0433   MCV 89.5 12/11/2016 0931   MCH 29.1 10/20/2017 0433   MCHC 33.6 10/20/2017 0433   RDW 14.6 10/20/2017 0433   RDW 14.3 12/11/2016 0931   LYMPHSABS 2.1 10/06/2017 0331   LYMPHSABS 1.1 12/11/2016 0931   MONOABS 1.1 (H) 10/06/2017 0331   MONOABS 0.4 12/11/2016 0931   EOSABS 0.1 10/06/2017 0331   EOSABS 0.1 12/11/2016 0931   BASOSABS 0.1 10/06/2017 0331   BASOSABS 0.0 12/11/2016 0931    Assessment/Plan: 1. AKI- in setting of worsening ascites and s/p multiple large volume paracenteses. Low FeNa consistent with possible hepatorenal but could also be related to intravascular volume depletion following paracenteses and low albumin. Also a component of BOO cannot be ruled out given his symptoms of retention and hesitancy. Agree with holding aldactone.  1. foley cath placed 10/18/17 with 300 cc's of urine 2. octreotide and midodrine started 10/18/17 given AKI and hypotension and possible hepatorenal syndrome.  3. Will hold off on HD at this time but if continues to worsen, will need to discuss goals/limits of care as this may be related to malignant ascites or hepatorenal syndrome (both with poor prognosis and limited treatment options).  4. continue to follow UOP and Cr. 5. Limit volume of paracentesis to no more than 3 liters to limit worsening renal injury 6. I spoke with Mr. Ambs and his son about his worsening renal function and discussed RRT.  He would be willing to try HD, however I did state that this is only a temporary measure if he does have a malignancy and/or has hepatorenal syndrome.  Will increase lasix to 160mg  tid and see if he responds, if not will ask IR to place temp HD cath tomorrow if his creatinine/potassium continue to climb and he does not respond to increased  doses of IV lasix.  Not sure if he can tolerate HD and may need transfer to ICU for CVVHD if family wishes to pursue more aggressive therapy. 2. STEMI- s/p PCI and DES in LAD. Stable and on dual antiplatelet therapy. 3. Ascites/cirrhosis/abdominal mass- sudden onset following cardiac cath/cardiogenic shock. ?portal HTN vs malignant ascites. Cytology pending and GI  following.  1. Need to limit volume of paracentesis to no more than 3 liters due to AKI and intravascular volume depletion.  Also will need albumin IV with paracentesis.  4. Hyponatremia due to Ascites  5. Hyperkalemia- due to #1 and off of aldactone. Will try IV lasix and veltassa and follow. This is also related to intravascular volume depletion and inability for distal sodium delivery. Can't give IVF's given significant ascites. 6. Severe protein malnutrition on supplements 7. DM- per primary 8. Disposition- poor prognosis if this is malignant ascites or hepatorenal syndrome.  Recommend palliative care consult to help set goals/limits of care.  Donetta Potts, MD [employer] [phone]

## 2017-10-20 NOTE — Progress Notes (Signed)
[place] TEAM 1 - Stepdown/ICU TEAM  Daniel Rivers  CWU:[id number] DOB: [birth year] DOA: 10/24/2017 PCP: [name], MD    Brief Narrative:  67 year old M w/ a hx of ankylosing spondylitis, anxiety, BPH, chronic back pain, CAD, GERD, dyslipidemia, obesity, nonalcoholic cirrhosis, hemochromatosis, and DM2 who was admitted by Cardiology on 10/18/2017 for a STEMI requiring a stent placement. Subsequently he developed abdominal pain and distention and was found to have ascites. GI was consulted. The patient was transferred to the [facility] service on 10/09/2017 for management of his medical issues.  The patient has recurrent ascites, and is hypotensive and tachycardic.  Significant Events: 10/31/2017 - Left heart catheterization with LAD DES placement  10/17/2017 - TTE -  EF 35% to 40%.Severe hypokinesis of the anteroseptal and apical myocardium - grade 2 diastolic dysfunction 04/10/86 [place] - Cirrhosis of the liver - small amount of abdominal ascites - 1.4 cm hypoechoic lesion upper pole left kidney, complex cystic versus small solid nodule 10/13/2017  CTchest  No pulmonary emboli 10/08/17 TTE - mild LVH - EF 45% - akinesis of the apical septal wall, the apicalanterior wall, and the true apex 10/08/17 -  Ultrasound-guided paracentesis by IR yielding 1 L of fluid.  Neutrophil count close to 200, SAAG score cannot be calculated as there was no serum albumin from that day. 10/08/17 CT potential mass lesion in the central mesentery positioned between the SMV and second portion duodenum. Consider endoscopic ultrasound (EUS) evaluation and tumor sampling. FDG PET scan may be beneficial on outpatient basis.  10/11/2017 Repeat paracentesis not done due to "no ascites on [place]" 10/13/2017 CT stat ordered, study completed 10/14/2017 showing large ascites. 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 had a therapeutic and diagnostic paracentesis 9/6 with 1 L of fluid removed - was to have repeat paracentesis 9/9 but erroneously "no fluid was found on [place]" - CT abdom confirmed large volume ascites - s/p repeat large volume paracentesis 9/12 and again on 9/14 - intolerant to diuretic as it rapidly worsens renal failure - now on midodrine + octreotide - will dose w/ scheduled albumin -repeat paracentesis 9/18 not performed secondary to less fluid in abdomen on exam-on my exam he has shifting dullness-the rate of re-accumulation and the fact that this is proving refractory to medical tx is suggestive this may be malignancy related as opposed to simply due to cirrhosis  Ascitic fluid from 9/14 does not show any atypia or cells of malignancy however this does not mean there is not any present  Hypotension, tachycardia severely intravascularly dehydrated, but complicated by marked ascites - volume balance proving very difficult - presently remains intravascularly dry - cont midodrine   Acute kidney injury ?hepatorenal picture now at play - crt steadily climbing - pt hypotensive, and yet grossly volume overloaded - Nephrology now following --- he is progressively hyperkalemic and azotemic-his hyponatremia secondary to hypervolemia-appreciate input from nephrology   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 Noted on CT abdom 10/14/17, and previously on scan 10/28/2017 - no prior CT or MRI imaging of abdom since 2014 - it is becoming more likely that this is a malignant  lesion, and that it is likely the etiology of his recalcitrant ascites  STEMI caused by LAD lesion requiring DES placement this admission - on dual antiplatelet therapy - asymptomatic at this time - this  was the initial reason for his hospitalization   Hyponatremia  Due to above - Na is holding steady    Hyperkalemia Due to AKI + aldactone - stopped alactone - K+ trending upward - tx as per Nephrology  Moderate to severe protein calorie malnutrition Cont Protostat  Dyslipidemia Cont statin   Left upper pole kidney cyst Outpatient PCP follow-up  DM2 CBG not yet at goal - adjust tx again today and follow   MRSA screen +  DVT prophylaxis: [place] heparin  Code Status: FULL CODE Family Communication I had a long chat with the patient and his son-I explained palliative approach and discussed that although he may have a life limiting illness it does not mean we cannot treat the consequences of this illness and allow him a life of dignity at the end of his days-I did mention this in layman's terms and clearly mentioned that I do not think his father would do well with aggressive medical therapy-I touched on the subject of CRRT in addition to renal replacement therapies as an adjunct to a goal in mind and if this is a malignant effusion which is suspected, I discussed with his son and him that this would likely just forestall the inevitable demise of his dad-they are agreeable at this time to palliative care input however we can plan accordingly in the next couple of days for medical therapy if they so choose-son understands clearly the difficult decisions ahead and seems to get the chest of my conversation and the fact that this may be irreversible Disposition Plan: SDU  Consultants:  GI PCCM Cardiology  Nephrology   Antimicrobials:  Rocephin 9/6 > 9/8  Objective: Blood pressure 122/76, pulse (!) 108, temperature 97.6 F (36.4 C), temperature source Oral, resp. rate (!) 22, height 6\' 4"  (1.93 m), weight 118.9 kg, SpO2 98 %.  Intake/Output Summary (Last 24 hours) at 10/20/2017 1638 Last data filed at 10/20/2017 0507 Gross per 24 hour  Intake 860 ml  Output 450 ml  Net 410 ml    Filed Weights   10/18/17 0500 10/19/17 0409 10/20/17 0503  Weight: 119.2 kg 119.2 kg 118.9 kg    Examination: Uncomfortable appearing bitemporal wasting mild icterus no pallor No thrush No submandibular lymphadenopathy No thyromegaly Massively swollen abdomen tense-shifting dullness is present- Chest is clear Lower extremity edema is present Power is 5 on 5 but effort is poor  CBC: Recent Labs  Lab 10/17/17 0430 10/19/17 0306 10/20/17 0433  WBC 16.0* 17.0* 15.4*  HGB 14.1 13.7 12.5*  HCT 42.3 40.6 37.2*  MCV 87.6 86.9 86.7  PLT 393 405* 621   Basic Metabolic Panel: Recent Labs  Lab 10/14/17 0340 10/15/17 0333  10/18/17 0419 10/19/17 0306 10/20/17 0433  NA 127* 129*   < > 124* 123* 122*  K 4.7 4.8   < > 5.2* 5.8* 6.0*  CL 94* 98   < > 91* 89* 87*  CO2 22 19*   < > 21* 23 23  GLUCOSE 158* 138*   < > 230* 191* 185*  BUN 41* 33*   < > 66* 76* 83*  CREATININE 1.04 0.96   < > 1.81* 2.09* 2.11*  CALCIUM 8.6* 8.4*   < > 9.6 9.4 9.9  MG 2.1 2.0  --   --   --  2.6*   < > = values in this interval not displayed.   GFR: Estimated Creatinine Clearance: 47.9 mL/min (A) (by C-G formula based on SCr of 2.11 mg/dL (H)).  Liver Function Tests: Recent Labs  Lab 10/17/17 0430 10/18/17 0419 10/19/17 0306 10/20/17 0433  AST 54* 73* 62* 44*  ALT 49* 62* 62* 43  ALKPHOS 59 64 66 58  BILITOT 1.1 1.2 1.1 1.4*  PROT 5.2* 5.6* 5.6* 6.0*  ALBUMIN 2.7* 3.0* 2.9* 3.4*    Coagulation Profile: Recent Labs  Lab 10/16/17 0653  INR 1.22    HbA1C: Hgb A1c MFr Bld  Date/Time Value Ref Range Status  10/06/2017 03:31 AM 6.9 (H) 4.8 - 5.6 % Final    Comment:    (NOTE) Pre diabetes:          5.7%-6.4% Diabetes:              >6.4% Glycemic control for   <7.0% adults with diabetes   05/22/2014 03:10 PM 6.5 (H) 4.8 - 5.6 % Final    Comment:    (NOTE)         Pre-diabetes: 5.7 - 6.4         Diabetes: >6.4         Glycemic control for adults with diabetes: <7.0      CBG: Recent Labs  Lab 10/19/17 0555 10/19/17 1208 10/19/17 1635 10/20/17 1116 10/20/17 1631  GLUCAP 210* 262* 220* 173* 210*    Recent Results (from the past 240 hour(s))  Gram stain     Status: None   Collection Time: 10/13/17 12:44 PM  Result Value Ref Range Status   Specimen Description PLEURAL RIGHT  Final   Special Requests NONE  Final   Gram Stain   Final    RARE WBC PRESENT, PREDOMINANTLY PMN NO ORGANISMS SEEN Performed at [facility], [address] [place], [place] [address]    Report Status 10/13/2017 FINAL  Final  Culture, body fluid-bottle     Status: None   Collection Time: 10/13/17 12:44 PM  Result Value Ref Range Status   Specimen Description PLEURAL RIGHT  Final   Special Requests NONE  Final   Culture   Final    NO GROWTH 5 DAYS Performed at [facility] [address] [place], [place] [address]    Report Status 10/18/2017 FINAL  Final     Scheduled Meds: . aspirin EC  81 mg Oral Daily  . buPROPion  300 mg Oral Daily  . feeding supplement (GLUCERNA SHAKE)  237 mL Oral BID BM  . feeding supplement (PRO-STAT SUGAR FREE 64)  30 mL Oral BID  . heparin injection (subcutaneous)  5,000 Units Subcutaneous Q8H  . insulin aspart  0-15 Units Subcutaneous TID WC  . insulin aspart  10 Units Subcutaneous TID WC  . insulin detemir  22 Units Subcutaneous QHS  . midodrine  10 mg Oral TID WC  . naloxegol oxalate  12.5 mg Oral Daily  . octreotide  100 mcg Subcutaneous TID  . patiromer  25.2 g Oral Daily  . polyethylene glycol  17 g Oral BID  . prasugrel  10 mg Oral Daily  . psyllium  1 packet Oral BID  . senna-docusate  1 tablet Oral BID  . zolpidem  5 mg Oral QHS   . [START ON 10/21/2017] albumin human    . furosemide 160 mg (10/20/17 1200)     LOS: 15 days   Verneita Griffes, MD  [facility] 4:38 PM  If 7PM-7AM, please contact night-coverage per Amion 10/20/2017, 4:38 PM

## 2017-10-20 NOTE — Progress Notes (Signed)
The cytology is negative for a malignant ascites.  No ascites to tap today with the ultrasound.  Creatinine has increased to 2.11.

## 2017-10-21 ENCOUNTER — Encounter (HOSPITAL_COMMUNITY): Payer: Self-pay | Admitting: Primary Care

## 2017-10-21 ENCOUNTER — Inpatient Hospital Stay (HOSPITAL_COMMUNITY): Payer: Medicare Other

## 2017-10-21 DIAGNOSIS — Z515 Encounter for palliative care: Secondary | ICD-10-CM

## 2017-10-21 DIAGNOSIS — Z7189 Other specified counseling: Secondary | ICD-10-CM

## 2017-10-21 LAB — CBC
HEMATOCRIT: 37.8 % — AB (ref 39.0–52.0)
Hemoglobin: 12.6 g/dL — ABNORMAL LOW (ref 13.0–17.0)
MCH: 29 pg (ref 26.0–34.0)
MCHC: 33.3 g/dL (ref 30.0–36.0)
MCV: 86.9 fL (ref 78.0–100.0)
Platelets: 315 10*3/uL (ref 150–400)
RBC: 4.35 MIL/uL (ref 4.22–5.81)
RDW: 14.7 % (ref 11.5–15.5)
WBC: 16 10*3/uL — ABNORMAL HIGH (ref 4.0–10.5)

## 2017-10-21 LAB — COMPREHENSIVE METABOLIC PANEL
ALT: 32 U/L (ref 0–44)
AST: 36 U/L (ref 15–41)
Albumin: 3.4 g/dL — ABNORMAL LOW (ref 3.5–5.0)
Alkaline Phosphatase: 51 U/L (ref 38–126)
Anion gap: 15 (ref 5–15)
BILIRUBIN TOTAL: 1.3 mg/dL — AB (ref 0.3–1.2)
BUN: 85 mg/dL — AB (ref 8–23)
CHLORIDE: 89 mmol/L — AB (ref 98–111)
CO2: 19 mmol/L — ABNORMAL LOW (ref 22–32)
Calcium: 9.8 mg/dL (ref 8.9–10.3)
Creatinine, Ser: 2.01 mg/dL — ABNORMAL HIGH (ref 0.61–1.24)
GFR calc Af Amer: 38 mL/min — ABNORMAL LOW (ref >60)
GFR, EST NON AFRICAN AMERICAN: 33 mL/min — AB (ref >60)
Glucose, Bld: 201 mg/dL — ABNORMAL HIGH (ref 70–99)
POTASSIUM: 5.2 mmol/L — AB (ref 3.5–5.1)
Sodium: 123 mmol/L — ABNORMAL LOW (ref 135–145)
Total Protein: 5.7 g/dL — ABNORMAL LOW (ref 6.5–8.1)

## 2017-10-21 LAB — GLUCOSE, CAPILLARY
GLUCOSE-CAPILLARY: 187 mg/dL — AB (ref 70–99)
Glucose-Capillary: 194 mg/dL — ABNORMAL HIGH (ref 70–99)

## 2017-10-21 MED ORDER — IOPAMIDOL (ISOVUE-300) INJECTION 61%
INTRAVENOUS | Status: AC
  Administered 2017-10-21: 16:00:00
  Filled 2017-10-21: qty 30

## 2017-10-21 NOTE — Progress Notes (Signed)
Stated by Nurse tech and RN night shift, Pt had 7 times of watery bowel movement from the past 24 hours. All laxative medication were not given to day. Observed Pt hasn't had any bowel movement today.   Kennyth Lose, RN

## 2017-10-21 NOTE — Progress Notes (Signed)
Subjective: Feeling better.  Objective: Vital signs in last 24 hours: Temp:  [97.8 F (36.6 C)-97.9 F (36.6 C)] 97.8 F (36.6 C) (09/19 0405) Pulse Rate:  [52-129] 127 (09/19 1000) Resp:  [25-39] 29 (09/19 1000) BP: (97-122)/(68-77) 109/76 (09/19 0800) SpO2:  [81 %-97 %] 96 % (09/19 1000) Weight:  [409 kg] 116 kg (09/19 0405) Last BM Date: 10/21/17  Intake/Output from previous day: 09/18 0701 - 09/19 0700 In: 100 [IV Piggyback:100] Out: 2200 [Urine:2200] Intake/Output this shift: Total I/O In: 684 [P.O.:490; IV Piggyback:194] Out: 500 [Urine:500]  General appearance: alert and not as fatigued appearing GI: soft, non-tender; bowel sounds normal; no masses,  no organomegaly and softer with palpation  Lab Results: Recent Labs    10/19/17 0306 10/20/17 0433 10/21/17 0356  WBC 17.0* 15.4* 16.0*  HGB 13.7 12.5* 12.6*  HCT 40.6 37.2* 37.8*  PLT 405* 342 315   BMET Recent Labs    10/19/17 0306 10/20/17 0433 10/21/17 0356  NA 123* 122* 123*  K 5.8* 6.0* 5.2*  CL 89* 87* 89*  CO2 23 23 19*  GLUCOSE 191* 185* 201*  BUN 76* 83* 85*  CREATININE 2.09* 2.11* 2.01*  CALCIUM 9.4 9.9 9.8   LFT Recent Labs    10/21/17 0356  PROT 5.7*  ALBUMIN 3.4*  AST 36  ALT 32  ALKPHOS 51  BILITOT 1.3*   PT/INR No results for input(s): LABPROT, INR in the last 72 hours. Hepatitis Panel No results for input(s): HEPBSAG, HCVAB, HEPAIGM, HEPBIGM in the last 72 hours. C-Diff No results for input(s): CDIFFTOX in the last 72 hours. Fecal Lactopherrin No results for input(s): FECLLACTOFRN in the last 72 hours.  Studies/Results: Ir Abdomen US Limited  Result Date: 10/20/2017 CLINICAL DATA:  Cirrhosis, recurrent abdominal ascites EXAM: LIMITED ABDOMEN ULTRASOUND FOR ASCITES TECHNIQUE: Limited ultrasound survey for ascites was performed in all four abdominal quadrants. COMPARISON:  10/16/2017 and previous FINDINGS: Small amount of scattered abdominal ascites. No large pocket for  safe therapeutic paracentesis. IMPRESSION: Minimal ascites.  Paracentesis deferred. Electronically Signed   By: Lucrezia Europe M.D.   On: 10/20/2017 13:32    Medications:  Scheduled: . aspirin EC  81 mg Oral Daily  . buPROPion  300 mg Oral Daily  . feeding supplement (GLUCERNA SHAKE)  237 mL Oral BID BM  . feeding supplement (PRO-STAT SUGAR FREE 64)  30 mL Oral BID  . heparin injection (subcutaneous)  5,000 Units Subcutaneous Q8H  . insulin aspart  0-15 Units Subcutaneous TID WC  . insulin aspart  10 Units Subcutaneous TID WC  . insulin detemir  22 Units Subcutaneous QHS  . midodrine  10 mg Oral TID WC  . naloxegol oxalate  12.5 mg Oral Daily  . octreotide  100 mcg Subcutaneous TID  . patiromer  25.2 g Oral Daily  . polyethylene glycol  17 g Oral BID  . prasugrel  10 mg Oral Daily  . psyllium  1 packet Oral BID  . senna-docusate  1 tablet Oral BID  . zolpidem  5 mg Oral QHS   Continuous: . albumin human 25 g (10/21/17 1013)  . furosemide 160 mg (10/21/17 8119)    Assessment/Plan: 1) Cirrhosis. 2) Ascites. 3) Recent STEMI.   Clinically he appears better.  His abdomen is still distended, but much softer.  It appears to be what I remembered when he was in my office, I.e., obesity.  The abdominal ultrasound yesterday was negative for any areas to safely perform a paracentesis.  The  creatinine today is at 2.09, which is a mild improvement.  Plan: 1) Continue with the current care. 2) No new GI recommendations.   LOS: 16 days   Kessler Kopinski D 10/21/2017, 12:45 PM

## 2017-10-21 NOTE — Progress Notes (Signed)
S: Still uncomfortable but feeling better O:BP 109/76   Pulse (!) 127   Temp 97.8 F (36.6 C) (Oral)   Resp (!) 29   Ht 6\' 4"  (1.93 m)   Wt 116 kg   SpO2 96%   BMI 31.13 kg/m   Intake/Output Summary (Last 24 hours) at 10/21/2017 1232 Last data filed at 10/21/2017 1026 Gross per 24 hour  Intake 784.01 ml  Output 2700 ml  Net -1915.99 ml   Intake/Output: I/O last 3 completed shifts: In: 300 [IV Piggyback:300] Out: 2950 [Urine:2950]  Intake/Output this shift:  Total I/O In: 684 [P.O.:490; IV Piggyback:194] Out: 500 [Urine:500] Weight change: -2.9 kg Gen: weak, fatigued WM in NAD CVS: tachy at 127 Resp: decreased BS at bases Abd: distended, +BS, + tenderness to palpation, no guarding/rebound Ext:1+ edema  Recent Labs  Lab 10/15/17 0333 10/16/17 0653 10/17/17 0430 10/18/17 0419 10/19/17 0306 10/20/17 0433 10/21/17 0356  NA 129* 126* 124* 124* 123* 122* 123*  K 4.8 5.4* 5.1 5.2* 5.8* 6.0* 5.2*  CL 98 95* 90* 91* 89* 87* 89*  CO2 19* 21* 20* 21* 23 23 19*  GLUCOSE 138* 241* 246* 230* 191* 185* 201*  BUN 33* 45* 57* 66* 76* 83* 85*  CREATININE 0.96 1.69* 1.77* 1.81* 2.09* 2.11* 2.01*  ALBUMIN 3.0* 2.8* 2.7* 3.0* 2.9* 3.4* 3.4*  CALCIUM 8.4* 8.8* 9.1 9.6 9.4 9.9 9.8  AST 57* 42* 54* 73* 62* 44* 36  ALT 57* 48* 49* 62* 62* 43 32   Liver Function Tests: Recent Labs  Lab 10/19/17 0306 10/20/17 0433 10/21/17 0356  AST 62* 44* 36  ALT 62* 43 32  ALKPHOS 66 58 51  BILITOT 1.1 1.4* 1.3*  PROT 5.6* 6.0* 5.7*  ALBUMIN 2.9* 3.4* 3.4*   No results for input(s): LIPASE, AMYLASE in the last 168 hours. No results for input(s): AMMONIA in the last 168 hours. CBC: Recent Labs  Lab 10/16/17 0653 10/17/17 0430 10/19/17 0306 10/20/17 0433 10/21/17 0356  WBC 14.6* 16.0* 17.0* 15.4* 16.0*  HGB 13.8 14.1 13.7 12.5* 12.6*  HCT 42.5 42.3 40.6 37.2* 37.8*  MCV 89.5 87.6 86.9 86.7 86.9  PLT 390 393 405* 342 315   Cardiac Enzymes: No results for input(s): CKTOTAL, CKMB,  CKMBINDEX, TROPONINI in the last 168 hours. CBG: Recent Labs  Lab 10/19/17 1208 10/19/17 1635 10/20/17 1116 10/20/17 1631 10/21/17 0634  GLUCAP 262* 220* 173* 210* 194*    Iron Studies: No results for input(s): IRON, TIBC, TRANSFERRIN, FERRITIN in the last 72 hours. Studies/Results: Ir Abdomen US Limited  Result Date: 10/20/2017 CLINICAL DATA:  Cirrhosis, recurrent abdominal ascites EXAM: LIMITED ABDOMEN ULTRASOUND FOR ASCITES TECHNIQUE: Limited ultrasound survey for ascites was performed in all four abdominal quadrants. COMPARISON:  10/16/2017 and previous FINDINGS: Small amount of scattered abdominal ascites. No large pocket for safe therapeutic paracentesis. IMPRESSION: Minimal ascites.  Paracentesis deferred. Electronically Signed   By: Lucrezia Europe M.D.   On: 10/20/2017 13:32   . aspirin EC  81 mg Oral Daily  . buPROPion  300 mg Oral Daily  . feeding supplement (GLUCERNA SHAKE)  237 mL Oral BID BM  . feeding supplement (PRO-STAT SUGAR FREE 64)  30 mL Oral BID  . heparin injection (subcutaneous)  5,000 Units Subcutaneous Q8H  . insulin aspart  0-15 Units Subcutaneous TID WC  . insulin aspart  10 Units Subcutaneous TID WC  . insulin detemir  22 Units Subcutaneous QHS  . midodrine  10 mg Oral TID WC  .  naloxegol oxalate  12.5 mg Oral Daily  . octreotide  100 mcg Subcutaneous TID  . patiromer  25.2 g Oral Daily  . polyethylene glycol  17 g Oral BID  . prasugrel  10 mg Oral Daily  . psyllium  1 packet Oral BID  . senna-docusate  1 tablet Oral BID  . zolpidem  5 mg Oral QHS    BMET    Component Value Date/Time   NA 123 (L) 10/21/2017 0356   NA 138 12/11/2016 0931   K 5.2 (H) 10/21/2017 0356   K 4.1 12/11/2016 0931   CL 89 (L) 10/21/2017 0356   CO2 19 (L) 10/21/2017 0356   CO2 28 12/11/2016 0931   GLUCOSE 201 (H) 10/21/2017 0356   GLUCOSE 128 12/11/2016 0931   BUN 85 (H) 10/21/2017 0356   BUN 13.1 12/11/2016 0931   CREATININE 2.01 (H) 10/21/2017 0356   CREATININE 0.89  09/16/2017 1118   CREATININE 0.8 12/11/2016 0931   CALCIUM 9.8 10/21/2017 0356   CALCIUM 9.6 12/11/2016 0931   GFRNONAA 33 (L) 10/21/2017 0356   GFRNONAA >60 09/16/2017 1118   GFRAA 38 (L) 10/21/2017 0356   GFRAA >60 09/16/2017 1118   CBC    Component Value Date/Time   WBC 16.0 (H) 10/21/2017 0356   RBC 4.35 10/21/2017 0356   HGB 12.6 (L) 10/21/2017 0356   HGB 13.8 09/16/2017 1118   HGB 13.4 12/11/2016 0931   HCT 37.8 (L) 10/21/2017 0356   HCT 40.9 12/11/2016 0931   PLT 315 10/21/2017 0356   PLT 132 (L) 09/16/2017 1118   PLT 116 (L) 12/11/2016 0931   MCV 86.9 10/21/2017 0356   MCV 89.5 12/11/2016 0931   MCH 29.0 10/21/2017 0356   MCHC 33.3 10/21/2017 0356   RDW 14.7 10/21/2017 0356   RDW 14.3 12/11/2016 0931   LYMPHSABS 2.1 10/06/2017 0331   LYMPHSABS 1.1 12/11/2016 0931   MONOABS 1.1 (H) 10/06/2017 0331   MONOABS 0.4 12/11/2016 0931   EOSABS 0.1 10/06/2017 0331   EOSABS 0.1 12/11/2016 0931   BASOSABS 0.1 10/06/2017 0331   BASOSABS 0.0 12/11/2016 0931     Assessment/Plan: 1. AKI- in setting of worsening ascites and s/p multiple large volume paracenteses. Low FeNa consistent with possible hepatorenal but could also be related to intravascular volume depletion following paracenteses and low albumin. Also a component of BOO cannot be ruled out given his symptoms of retention and hesitancy. Agree with holding aldactone.  1. foley cath placed9/16/19 with 300 cc's of urine 2. octreotide and midodrinestarted 9/16/19given AKI and hypotension and possible hepatorenal syndrome. 3. Will hold off on HDat this time as his potassium and creatinine are starting to improve.    4. continue to follow UOP and Cr. 5. Limit volume of paracentesis to no more than 3 liters to limit worsening renal injury 6. I spoke with Mr. Nicholl and his son about his worsening renal function and discussed RRT.  He would be willing to try HD, however I did state that this is only a temporary  measure if he does have a malignancy and/or has hepatorenal syndrome.  Thankfully his renal function and potassium are improved and will hold off on HD access placement 7. Responding to IV lasix to 160mg  tid. 2. STEMI- s/p PCI and DES in LAD. Stable and on dual antiplatelet therapy. 3. Ascites/cirrhosis/abdominal mass- sudden onset following cardiac cath/cardiogenic shock. ?portal HTN vs malignant ascites. Cytology negative and GI following.  1. Repeat US without evidence of ascites, unclear  etiology of abdominal distention.   2. Need to limit volume of any future paracentesis to no more than 3 liters due to AKI and intravascular volume depletion.  Also will need albumin IV with paracentesis. 3. Need to discuss with GI best way to biopsy mass for diagnosis  4. Hyponatremia due to Ascites  5. Hyperkalemia- due to #1 and off of aldactone. Will try IV lasix and veltassa and follow. This is also related to intravascular volume depletion and inability for distal sodium delivery. Can't give IVF's given significant ascites.  Improving slowly 6. Severe protein malnutrition on supplements 7. DM- per primary 8. Disposition- poor prognosis if this is malignant ascites or hepatorenal syndrome. Recommend palliative care consult to help set goals/limits of care.  Donetta Potts, MD [employer] [phone]

## 2017-10-21 NOTE — Progress Notes (Signed)
Nutrition Follow Up  DOCUMENTATION CODES:   Non-severe (moderate) malnutrition in context of chronic illness  INTERVENTION:    Glucerna Shake po BID, each supplement provides 220 kcal and 10 grams of protein  Prostat to 30 ml po BID, each supplement provides 100 kcal and 15 gm protein  NUTRITION DIAGNOSIS:   Moderate Malnutrition related to chronic illness(cirrhosis, CAD) as evidenced by mild fat depletion, moderate fat depletion, mild muscle depletion, moderate muscle depletion, ongoing  GOAL:   Patient will meet greater than or equal to 90% of their needs, unmet  MONITOR:   PO intake, Supplement acceptance, Weight trends, Skin  ASSESSMENT:   67 yo male with PMH of HTN, HLD, DM-2, GERD, chronic back pain, CAD, and non-alcoholic cirrhosis who was admitted on 9/3 with STEMI s/p stent placement 9/5. 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   9/6 transferred out of [facility]  RD briefly spoke with Mr. Biehn at bedside. He said he has a couple bites of his lunch today ([place] breast). PO intake variable at 10-50% per flowsheet records.  Per RN he is drinking sips of his supplements. Labs reviewed. Na 123 (L). K 5.2 (H). CBG's [id number].  Palliative Medicine Team note 9/19 reviewed. Prognosis is very poor.  Diet Order:   Diet Order            Diet 2 gram sodium Room service appropriate? Yes; Fluid consistency: Thin; Fluid restriction: 1500 mL Fluid  Diet effective now             EDUCATION NEEDS:   No education needs have been identified at this time  Skin:  Skin Assessment: Reviewed RN Assessment  Last BM:  9/11  Height:   Ht Readings from Last 1 Encounters:  10/16/2017 6\' 4"  (1.93 m)   Weight:   Wt Readings from Last 1 Encounters:  10/21/17 116 kg   Ideal Body Weight:  91.8 kg  BMI:  Body mass index is 31.13 kg/m.  Estimated Nutritional Needs:   Kcal:  2100-2300  Protein:  120-135 gm  Fluid:  2.1-2.3 L  Arthur Holms, RD,  LDN Pager #: [phone] After-Hours Pager #: [phone]

## 2017-10-21 NOTE — Consult Note (Addendum)
Consultation Note Date: 10/21/2017   Patient Name: Daniel Rivers  DOB: 1950-02-16  MRN: [id number]  Age / Sex: 67 y.o., male  PCP: Daniel Gravel, MD Referring Physician: Nita Sells, MD  Reason for Consultation: Establishing goals of care  HPI/Patient Profile: 67 y.o. 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   Clinical Assessment and Goals of Care: Daniel Rivers is resting quietly in bed.  He greets me making and briefly keeping eye contact.  He is calm and cooperative, alert and oriented.  Present today at bedside his son, Daniel Rivers.  We talked in detail about Daniel Rivers acute and chronic health problems.  We talked about his functional status prior to this hospitalization.  We talked in detail about what if's and maybe's, my worries, disposition options.  We talked about 16 days of hospitalization, modern medicine doing what we can to change things, but still no improvements.  I share that we are looking for meaningful improvements, not waxing and waning.    We talked about discharge to home home, but he would need 24/7 care.  Home services could include home health versus hospice services, these were discussed.  We talked about rehab, but I share that I do not believe Daniel Rivers is strong enough to participate in rehab, and rehab would not change his medical problems. We talked about residential hospice.    Multiple times during our conversation Daniel Rivers attempts to have Daniel Rivers share what is important to him, as our conversation lengthens, Daniel Rivers becomes disengaged, he is clearly fatigued.  During our visit, hospitalist, Dr. Verlon Au, arrives as requested by son.  Dr. Verlon Au is clear  information, discusses options, and shares that he or his coworkers will follow up tomorrow after PMT discussions.  We talked about Pleurx drain, and my worry that although this may assist with breathing, it would likely damage kidney function with fluid removal.  We review labs in detail.  We talked about prognosis, with permission.  I share that weeks would not be surprising.  Later in the hall, privately, Daniel Rivers and I continue prognosis discussions.  I share that I also would not be surprised if Daniel Rivers had a sudden event and passed this evening.  Daniel Rivers states that he would not be surprised either.  We talked about how to make choices for loved ones including keeping them at the center, are we doing something for them or to them (can we change what is happening).  Daniel Rivers shares that he is considering residential hospice as he feels like his father cannot go home safely, and rehab would not benefit him.  We talked about 24 to 48 hours for outcomes.  PMT will follow up with son Daniel Rivers tomorrow around 11 AM.  Conference with nursing staff related to goals of care.  Conference with hospitalist, Dr. Verlon Au related to goals of care conversation,  disposition considerations.  Healthcare power of attorney NEXT OF KIN -Daniel Rivers names his son, Daniel Rivers, as his Ambulance person.  Daniel Rivers tells me that he wants to respect to his father's wishes, hear his voice.   SUMMARY OF RECOMMENDATIONS   24 to 48 hours for outcomes. Considering residential hospice. PMT to follow-up with son 9/20 around 11 AM. Considering Pleurx drain for comfort, but aware of risk for hepatorenal syndrome and sudden death with fluid removal.  Code Status/Advance Care Planning:  DNR  Symptom Management:   Per hospitalist, no additional needs at this time.  Palliative Prophylaxis:   Aspiration, Frequent Pain Assessment and Turn Reposition  Additional Recommendations (Limitations, Scope, Preferences):  Continue  to treat the treatable but no extraordinary measures such as CPR or intubation.  Psycho-social/Spiritual:   Desire for further Chaplaincy support:no  Additional Recommendations: Caregiving  Support/Resources and Education on Hospice  Prognosis:   < 2 weeks, would not be surprising based on frailty, weakness, poor functional status, 16-day hospitalization without improvement, recurrent ascites questionable malignancy.  Discharge Planning: To be determined.  Son shares that he is considering residential hospice as he feels like his father cannot go home safely, and rehab would not benefit him.      Primary Diagnoses: Present on Admission: . Acute MI, anterolateral wall, initial episode of care ([place]) . STEMI (ST elevation myocardial infarction) ([place])   I have reviewed the medical record, interviewed the patient and family, and examined the patient. The following aspects are pertinent.  Past Medical History:  Diagnosis Date  . Ankylosing spondylitis of cervical region ([place])   . Ankylosing spondylitis of lumbar region ([place])   . Anxiety   . BPH (benign prostatic hypertrophy)   . Bursitis, trochanteric   . Chronic back pain   . Chronic cervical pain   . Coronary artery disease   . Daily headache    "recently; related to RX for my heart" (05/21/2014)  . Depression   . GERD (gastroesophageal reflux disease)   . Hemochromatosis   . High cholesterol   . History of hiatal hernia   . Hypertension   . Non-alcoholic cirrhosis ([place])    "from the hemochromatosis"  . Obesity   . Osteoarthritis    "joints" (05/20/2104)  . Rotator cuff syndrome   . Tendonitis   . Type II diabetes mellitus ([place])    Social History   Socioeconomic History  . Marital status: Widowed    Spouse name: Not on file  . Number of children: Not on file  . Years of education: Not on file  . Highest education level: Not on file  Occupational History  . Not on file  Social Needs  . Financial resource strain:  Not on file  . Food insecurity:    Worry: Not on file    Inability: Not on file  . Transportation needs:    Medical: Not on file    Non-medical: Not on file  Tobacco Use  . Smoking status: Former Smoker    Packs/day: 5.00    Years: 23.00    Pack years: 115.00    Types: Cigarettes    Last attempt to quit: 06/16/1986    Years since quitting: 31.3  . Smokeless tobacco: Never Used  Substance and Sexual Activity  . Alcohol use: Yes    Comment: 05/22/2014  "used to drink casually; never had problem w/it; none since ~ 2004"  . Drug use: No  . Sexual activity: Never  Lifestyle  . Physical  activity:    Days per week: Not on file    Minutes per session: Not on file  . Stress: Not on file  Relationships  . Social connections:    Talks on phone: Not on file    Gets together: Not on file    Attends religious service: Not on file    Active member of club or organization: Not on file    Attends meetings of clubs or organizations: Not on file    Relationship status: Not on file  Other Topics Concern  . Not on file  Social History Narrative  . Not on file   History reviewed. No pertinent family history. Scheduled Meds: . aspirin EC  81 mg Oral Daily  . buPROPion  300 mg Oral Daily  . feeding supplement (GLUCERNA SHAKE)  237 mL Oral BID BM  . feeding supplement (PRO-STAT SUGAR FREE 64)  30 mL Oral BID  . heparin injection (subcutaneous)  5,000 Units Subcutaneous Q8H  . insulin aspart  0-15 Units Subcutaneous TID WC  . insulin aspart  10 Units Subcutaneous TID WC  . insulin detemir  22 Units Subcutaneous QHS  . midodrine  10 mg Oral TID WC  . naloxegol oxalate  12.5 mg Oral Daily  . octreotide  100 mcg Subcutaneous TID  . patiromer  25.2 g Oral Daily  . polyethylene glycol  17 g Oral BID  . prasugrel  10 mg Oral Daily  . psyllium  1 packet Oral BID  . senna-docusate  1 tablet Oral BID  . zolpidem  5 mg Oral QHS   Continuous Infusions: . albumin human 25 g (10/21/17 1013)  .  furosemide 160 mg (10/21/17 0952)   PRN Meds:.acetaminophen, diazepam, guaiFENesin-dextromethorphan, ipratropium-albuterol, lidocaine (PF), lidocaine, morphine, nitroGLYCERIN, ondansetron (ZOFRAN) IV Medications Prior to Admission:  Prior to Admission medications   Medication Sig Start Date End Date Taking? Authorizing Provider  aspirin 81 MG tablet Take 81 mg by mouth daily.    Yes [provider]  bisacodyl (DULCOLAX) 5 MG EC tablet Take 10 mg by mouth at bedtime.    Yes [provider]  buPROPion (WELLBUTRIN XL) 300 MG 24 hr tablet Take 300 mg by mouth daily.  04/05/15  Yes [provider]  carvedilol (COREG) 3.125 MG tablet Take 3.125 mg by mouth 2 (two) times daily.   Yes [provider]  Cholecalciferol (VITAMIN D3) 2000 UNITS TABS Take 1 tablet by mouth daily.   Yes [provider]  diclofenac (VOLTAREN) 75 MG EC tablet Take 1 tablet (75 mg total) by mouth 2 (two) times daily with a meal. As needed Patient taking differently: Take 75 mg by mouth 2 (two) times daily as needed (hip pain).  10/07/16  Yes Meredith Staggers, MD  furosemide (LASIX) 20 MG tablet Take 20 mg by mouth as needed for fluid.  09/26/12  Yes [provider]  glimepiride (AMARYL) 2 MG tablet Take 3 mg by mouth daily before breakfast.    Yes [provider]  KRILL OIL PO Take 1 tablet by mouth daily.    Yes [provider]  losartan (COZAAR) 100 MG tablet Take 50 mg by mouth daily.    Yes [provider]  Magnesium Oxide 400 MG CAPS Take 400 mg by mouth daily.    Yes [provider]  metFORMIN (GLUCOPHAGE) 1000 MG tablet Take 1,000 mg by mouth every 12 (twelve) hours.   Yes [provider]  morphine (MS CONTIN) 60 MG  12 hr tablet Take 1 tablet (60 mg total) by mouth 3 (three) times daily. 08/02/17  Yes Bayard Hugger, NP  Multiple Vitamin (MULTIVITAMIN) capsule Take 1 capsule by mouth daily. NO IRON   Yes [provider]    NITROSTAT 0.4 MG SL tablet Place 0.4 mg under the tongue every 5 (five) minutes as needed for chest pain.  11/16/11  Yes [provider]  polyethylene glycol powder (GLYCOLAX/MIRALAX) powder Using measuring cap mix  17gm in water and drink two times daily Patient taking differently: Take 17 g by mouth 2 (two) times daily.  06/19/13  Yes Meredith Staggers, MD  pravastatin (PRAVACHOL) 80 MG tablet Take 80 mg by mouth at bedtime.    Yes [provider]  Psyllium (METAMUCIL PO) Take 17 g by mouth 2 (two) times daily.    Yes [provider]  zolpidem (AMBIEN) 10 MG tablet Take 10 mg by mouth at bedtime.    Yes [provider]  oxyCODONE (OXY IR/ROXICODONE) 5 MG immediate release tablet Take 1 tablet (5 mg total) by mouth every 6 (six) hours as needed for severe pain. Patient not taking: Reported on 10/13/2017 08/02/17   Bayard Hugger, NP   Allergies  Allergen Reactions  . Amlodipine Besy-Benazepril Hcl Cough  . Brilinta [Ticagrelor] Cough  . Naproxen Other (See Comments)    Worsens Tinnitus   Review of Systems  Unable to perform ROS: Acuity of condition    Physical Exam  Constitutional: He is oriented to person, place, and time. He appears ill.  Appears acutely/chronically ill, frail.  Makes and briefly keeps eye contact.  HENT:  Head: Atraumatic.  Some temporal wasting  Cardiovascular:  Rate in the 120s sustained  Pulmonary/Chest:  Some work of breathing noted due to large abdomen  Abdominal: He exhibits distension. There is no guarding.  Large firm abdomen  Musculoskeletal:       Right lower leg: He exhibits edema.       Left lower leg: He exhibits edema.  Neurological: He is alert and oriented to person, place, and time.  Skin: Skin is warm and dry.  Psychiatric: His mood appears not anxious. He is not agitated.  Calm and cooperative, appears extremely tired  Nursing note and vitals reviewed.   Vital Signs: BP 105/86   Pulse (!) 125   Temp  97.8 F (36.6 C) (Oral)   Resp (!) 27   Ht 6\' 4"  (1.93 m)   Wt 116 kg   SpO2 93%   BMI 31.13 kg/m  Pain Scale: 0-10 POSS *See Group Information*: 1-Acceptable,Awake and alert Pain Score: 0-No pain   SpO2: SpO2: 93 % O2 Device:SpO2: 93 % O2 Flow Rate: .O2 Flow Rate (L/min): 3 L/min  IO: Intake/output summary:   Intake/Output Summary (Last 24 hours) at 10/21/2017 1407 Last data filed at 10/21/2017 1300 Gross per 24 hour  Intake 934.01 ml  Output 3050 ml  Net -2115.99 ml    LBM: Last BM Date: 10/21/17 Baseline Weight: Weight: 113.4 kg Most recent weight: Weight: 116 kg     Palliative Assessment/Data:   Flowsheet Rows     Most Recent Value  Intake Tab  Referral Department  Hospitalist  Unit at Time of Referral  Intermediate Care Unit  Palliative Care Primary Diagnosis  -- [GI]  Date Notified  10/20/17  Palliative Care Type  New Palliative care  Reason for referral  Clarify Goals of Care  Date of Admission  10/06/2017  Date first  seen by Palliative Care  10/21/17  # of days Palliative referral response time  1 Day(s)  # of days IP prior to Palliative referral  15  Clinical Assessment  Palliative Performance Scale Score  30%  Pain Max last 24 hours  Not able to report  Pain Min Last 24 hours  Not able to report  Dyspnea Max Last 24 Hours  Not able to report  Dyspnea Min Last 24 hours  Not able to report  Psychosocial & Spiritual Assessment  Palliative Care Outcomes  Patient/Family meeting held?  Yes  Who was at the meeting?  pt and son at bedside  [place] goals of care, Counseled regarding hospice, Provided psychosocial or spiritual support  Patient/Family wishes: Interventions discontinued/not started   Mechanical Ventilation      Time In: 1440 Time Out: 1600 Time Total: 80 minutes Greater than 50%  of this time was spent counseling and coordinating care related to the above assessment and plan.  Signed by: Drue Novel, NP     Please contact Palliative Medicine Team phone at [phone] for questions and concerns.  For individual provider: See Shea Evans

## 2017-10-21 NOTE — Progress Notes (Signed)
[place] TEAM 1 - Stepdown/ICU TEAM  GAY RAPE  ESP:[id number] DOB: [birth year] DOA: 10/14/2017 PCP: [name], MD    Brief Narrative:  67 year old M w/ a hx of ankylosing spondylitis, anxiety, BPH, chronic back pain, CAD, GERD, dyslipidemia, obesity, nonalcoholic cirrhosis, hemochromatosis, and DM2 who was admitted by Cardiology on 10/26/2017 for a STEMI requiring a stent placement. Subsequently he developed abdominal pain and distention and was found to have ascites. GI was consulted. The patient was transferred to the [facility] service on 10/09/2017 for management of his medical issues.  The patient has recurrent ascites, and is hypotensive and tachycardic.  Significant Events: 10/20/2017 - Left heart catheterization with LAD DES placement  10/03/2017 - TTE -  EF 35% to 40%.Severe hypokinesis of the anteroseptal and apical myocardium - grade 2 diastolic dysfunction 07/06/31 [place] - Cirrhosis of the liver - small amount of abdominal ascites - 1.4 cm hypoechoic lesion upper pole left kidney, complex cystic versus small solid nodule 10/19/2017  CTchest  No pulmonary emboli 10/08/17 TTE - mild LVH - EF 45% - akinesis of the apical septal wall, the apicalanterior wall, and the true apex 10/08/17 -  Ultrasound-guided paracentesis by IR yielding 1 L of fluid.  Neutrophil count close to 200, SAAG score cannot be calculated as there was no serum albumin from that day. 10/08/17 CT potential mass lesion in the central mesentery positioned between the SMV and second portion duodenum. Consider endoscopic ultrasound (EUS) evaluation and tumor sampling. FDG PET scan may be beneficial on outpatient basis.  10/11/2017 Repeat paracentesis not done due to "no ascites on [place]" 10/13/2017 CT stat ordered, study completed 10/14/2017 showing large ascites. 10/14/2017 repeat paracentesis 10/16/2017 repeat paracentesis - 5.1L  Subjective: Patient slightly more alert but waxing waning mentation  Assessment & Plan:  Abdom distention - abdom  compartment syndrome - Acute hypoxic resp failure - Hemochromatosis induced cirrhosis w/ ascites v/s malignant ascites had a therapeutic and diagnostic paracentesis 9/6 with 1 L of fluid removed - was to have repeat paracentesis 9/9 but erroneously "no fluid was found on [place]" - CT abdom confirmed large volume ascites - s/p repeat large volume paracentesis 9/12 and again on 9/14 - intolerant to diuretic as it rapidly worsens renal failure - now on midodrine + octreotide - will dose w/ scheduled albumin -repeat paracentesis 9/18 not performed secondary to less fluid in abdomen on exam-  he has shifting dullness- Ascitic fluid from 9/14 does not show any atypia or cells of malignancy--probably still malignant effusion Getting noncon CT today and will discuss Paracentesis after Palliative and family/patient meet  Hypotension, tachycardia severely intravascularly dehydrated, but complicated by marked ascites - volume balance proving very difficult - presently remains intravascularly dry - cont midodrine   Acute kidney injury ?hepatorenal picture now at play - crt stabilizing some grossly volume overloaded - Nephrology now following -- hyponatremia 2/2 hypervolemia-appreciate input from nephrology  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 Noted on CT abdom 10/14/17, and previously on scan 10/07/17 - no prior CT or MRI imaging of abdom since 2014 - it is becoming more likely that this is a malignant lesion, and that it is likely the etiology of his recalcitrant ascites  STEMI caused by LAD lesion requiring DES placement this admission - on dual antiplatelet therapy - asymptomatic at this time - this was the initial reason for his hospitalization   Hyponatremia  Due to above - Na is holding steady  Hyperkalemia Due to AKI + aldactone - stopped alactone - K+  tx as per Nephrology  Moderate to severe protein calorie malnutrition Cont Protostat  Dyslipidemia Cont  statin   Left upper pole kidney cyst Outpatient PCP follow-up  DM2 CBG not yet at goal - adjust tx again today and follow   MRSA screen +  DVT prophylaxis: [place] heparin  Code Status: FULL CODE Family Communication I had a long chat with the patient and his son-Pallaitve input appreciated and we will follow after they have had time to process optiopns--they are leaning towards [place]/comfort trajectory Disposition Plan: SDU  Consultants:  GI PCCM Cardiology  Nephrology   Antimicrobials:  Rocephin 9/6 > 9/8  Objective: Blood pressure 110/77, pulse (!) 121, temperature 97.6 F (36.4 C), temperature source Oral, resp. rate (!) 23, height 6\' 4"  (1.93 m), weight 116 kg, SpO2 95 %.  Intake/Output Summary (Last 24 hours) at 10/21/2017 1725 Last data filed at 10/21/2017 1700 Gross per 24 hour  Intake 1358.73 ml  Output 3850 ml  Net -2491.27 ml   Filed Weights   10/19/17 0409 10/20/17 0503 10/21/17 0405  Weight: 119.2 kg 118.9 kg 116 kg    Examination:  Massively swollen abdomen tense-shifting dullness is present- Chest is clear Lower extremity edema is present Power is 5 on 5 but effort is poor  CBC: Recent Labs  Lab 10/19/17 0306 10/20/17 0433 10/21/17 0356  WBC 17.0* 15.4* 16.0*  HGB 13.7 12.5* 12.6*  HCT 40.6 37.2* 37.8*  MCV 86.9 86.7 86.9  PLT 405* 342 542   Basic Metabolic Panel: Recent Labs  Lab 10/15/17 0333  10/19/17 0306 10/20/17 0433 10/21/17 0356  NA 129*   < > 123* 122* 123*  K 4.8   < > 5.8* 6.0* 5.2*  CL 98   < > 89* 87* 89*  CO2 19*   < > 23 23 19*  GLUCOSE 138*   < > 191* 185* 201*  BUN 33*   < > 76* 83* 85*  CREATININE 0.96   < > 2.09* 2.11* 2.01*  CALCIUM 8.4*   < > 9.4 9.9 9.8  MG 2.0  --   --  2.6*  --    < > = values in this interval not displayed.   GFR: Estimated Creatinine Clearance: 49.7 mL/min (A) (by C-G formula based on SCr of 2.01 mg/dL (H)).  Liver Function Tests: Recent Labs  Lab 10/18/17 0419 10/19/17 0306  10/20/17 0433 10/21/17 0356  AST 73* 62* 44* 36  ALT 62* 62* 43 32  ALKPHOS 64 66 58 51  BILITOT 1.2 1.1 1.4* 1.3*  PROT 5.6* 5.6* 6.0* 5.7*  ALBUMIN 3.0* 2.9* 3.4* 3.4*    Coagulation Profile: Recent Labs  Lab 10/16/17 0653  INR 1.22    HbA1C: Hgb A1c MFr Bld  Date/Time Value Ref Range Status  10/06/2017 03:31 AM 6.9 (H) 4.8 - 5.6 % Final    Comment:    (NOTE) Pre diabetes:          5.7%-6.4% Diabetes:              >6.4% Glycemic control for   <7.0% adults with diabetes   05/22/2014 03:10 PM 6.5 (H) 4.8 - 5.6 % Final    Comment:    (NOTE)         Pre-diabetes: 5.7 - 6.4         Diabetes: >6.4         Glycemic control for adults with  diabetes: <7.0     CBG: Recent Labs  Lab 10/19/17 1208 10/19/17 1635 10/20/17 1116 10/20/17 1631 10/21/17 0634  GLUCAP 262* 220* 173* 210* 194*    Recent Results (from the past 240 hour(s))  Gram stain     Status: None   Collection Time: 10/13/17 12:44 PM  Result Value Ref Range Status   Specimen Description PLEURAL RIGHT  Final   Special Requests NONE  Final   Gram Stain   Final    RARE WBC PRESENT, PREDOMINANTLY PMN NO ORGANISMS SEEN Performed at [facility], [place] [address], [place], [place] [address]    Report Status 10/13/2017 FINAL  Final  Culture, body fluid-bottle     Status: None   Collection Time: 10/13/17 12:44 PM  Result Value Ref Range Status   Specimen Description PLEURAL RIGHT  Final   Special Requests NONE  Final   Culture   Final    NO GROWTH 5 DAYS Performed at [facility] [address] [place],  [address]    Report Status 10/18/2017 FINAL  Final     Scheduled Meds: . aspirin EC  81 mg Oral Daily  . buPROPion  300 mg Oral Daily  . feeding supplement (GLUCERNA SHAKE)  237 mL Oral BID BM  . feeding supplement (PRO-STAT SUGAR FREE 64)  30 mL Oral BID  . heparin injection (subcutaneous)  5,000 Units Subcutaneous Q8H  . insulin aspart  0-15 Units Subcutaneous TID WC  .  insulin aspart  10 Units Subcutaneous TID WC  . insulin detemir  22 Units Subcutaneous QHS  . midodrine  10 mg Oral TID WC  . naloxegol oxalate  12.5 mg Oral Daily  . octreotide  100 mcg Subcutaneous TID  . patiromer  25.2 g Oral Daily  . polyethylene glycol  17 g Oral BID  . prasugrel  10 mg Oral Daily  . psyllium  1 packet Oral BID  . senna-docusate  1 tablet Oral BID  . zolpidem  5 mg Oral QHS   . albumin human 25 g (10/21/17 1013)  . furosemide 160 mg (10/21/17 1544)     LOS: 16 days  35 min  Verneita Griffes, MD [facility] 5:25 PM  If 7PM-7AM, please contact night-coverage per Amion 10/21/2017, 5:25 PM

## 2017-10-21 NOTE — Progress Notes (Signed)
PT Cancellation Note  Patient Details Name: Daniel Rivers MRN: [id number] DOB: 09/05/1950   Cancelled Treatment:    Reason Eval/Treat Not Completed: Other (comment). Pt and son meeting with palliative care. Will follow up at later date.   [place] 10/21/2017, 3:45 PM [place] Pager [phone] Office [phone]

## 2017-10-22 ENCOUNTER — Inpatient Hospital Stay (HOSPITAL_COMMUNITY): Payer: Medicare Other

## 2017-10-22 DIAGNOSIS — Z7189 Other specified counseling: Secondary | ICD-10-CM

## 2017-10-22 DIAGNOSIS — Z515 Encounter for palliative care: Secondary | ICD-10-CM

## 2017-10-22 LAB — GLUCOSE, CAPILLARY
GLUCOSE-CAPILLARY: 159 mg/dL — AB (ref 70–99)
GLUCOSE-CAPILLARY: 208 mg/dL — AB (ref 70–99)
GLUCOSE-CAPILLARY: 180 mg/dL — AB (ref 70–99)
Glucose-Capillary: 202 mg/dL — ABNORMAL HIGH (ref 70–99)
Glucose-Capillary: 217 mg/dL — ABNORMAL HIGH (ref 70–99)
Glucose-Capillary: 228 mg/dL — ABNORMAL HIGH (ref 70–99)
Glucose-Capillary: 170 mg/dL — ABNORMAL HIGH (ref 70–99)
Glucose-Capillary: 174 mg/dL — ABNORMAL HIGH (ref 70–99)
Glucose-Capillary: 182 mg/dL — ABNORMAL HIGH (ref 70–99)
Glucose-Capillary: 179 mg/dL — ABNORMAL HIGH (ref 70–99)

## 2017-10-22 LAB — CBC WITH DIFFERENTIAL/PLATELET
Abs Immature Granulocytes: 0.1 10*3/uL (ref 0.0–0.1)
Basophils Absolute: 0 10*3/uL (ref 0.0–0.1)
Basophils Relative: 0 %
EOS PCT: 0 %
Eosinophils Absolute: 0 10*3/uL (ref 0.0–0.7)
HEMATOCRIT: 38.8 % — AB (ref 39.0–52.0)
HEMOGLOBIN: 12.9 g/dL — AB (ref 13.0–17.0)
Immature Granulocytes: 1 %
LYMPHS ABS: 1.5 10*3/uL (ref 0.7–4.0)
LYMPHS PCT: 9 %
MCH: 28.7 pg (ref 26.0–34.0)
MCHC: 33.2 g/dL (ref 30.0–36.0)
MCV: 86.4 fL (ref 78.0–100.0)
MONO ABS: 1.5 10*3/uL — AB (ref 0.1–1.0)
MONOS PCT: 9 %
Neutro Abs: 12.8 10*3/uL — ABNORMAL HIGH (ref 1.7–7.7)
Neutrophils Relative %: 81 %
Platelets: 333 10*3/uL (ref 150–400)
RBC: 4.49 MIL/uL (ref 4.22–5.81)
RDW: 15 % (ref 11.5–15.5)
WBC: 15.9 10*3/uL — ABNORMAL HIGH (ref 4.0–10.5)

## 2017-10-22 LAB — COMPREHENSIVE METABOLIC PANEL
ALT: 30 U/L (ref 0–44)
AST: 36 U/L (ref 15–41)
Albumin: 3.5 g/dL (ref 3.5–5.0)
Alkaline Phosphatase: 54 U/L (ref 38–126)
Anion gap: 15 (ref 5–15)
BUN: 86 mg/dL — AB (ref 8–23)
CHLORIDE: 87 mmol/L — AB (ref 98–111)
CO2: 24 mmol/L (ref 22–32)
Calcium: 10.6 mg/dL — ABNORMAL HIGH (ref 8.9–10.3)
Creatinine, Ser: 2.09 mg/dL — ABNORMAL HIGH (ref 0.61–1.24)
GFR calc Af Amer: 36 mL/min — ABNORMAL LOW (ref >60)
GFR calc non Af Amer: 31 mL/min — ABNORMAL LOW (ref >60)
Glucose, Bld: 156 mg/dL — ABNORMAL HIGH (ref 70–99)
POTASSIUM: 4.6 mmol/L (ref 3.5–5.1)
Sodium: 126 mmol/L — ABNORMAL LOW (ref 135–145)
Total Bilirubin: 1.3 mg/dL — ABNORMAL HIGH (ref 0.3–1.2)
Total Protein: 6.1 g/dL — ABNORMAL LOW (ref 6.5–8.1)

## 2017-10-22 MED ORDER — RIFAXIMIN 550 MG PO TABS
550.0000 mg | ORAL_TABLET | Freq: Two times a day (BID) | ORAL | Status: DC
  Administered 2017-10-22 (×2): 550 mg via ORAL
  Filled 2017-10-22 (×2): qty 1

## 2017-10-22 MED ORDER — RESOURCE THICKENUP CLEAR PO POWD
ORAL | Status: DC | PRN
  Filled 2017-10-22: qty 125

## 2017-10-22 MED ORDER — LACTULOSE 10 GM/15ML PO SOLN
10.0000 g | Freq: Two times a day (BID) | ORAL | Status: DC
  Administered 2017-10-22: 10 g via ORAL
  Filled 2017-10-22 (×2): qty 15

## 2017-10-22 NOTE — Progress Notes (Addendum)
S: Very somnolent this morning and hypoxic.  accu check 174. O:BP 115/88 (BP Location: Right Arm)   Pulse (!) 131   Temp 97.9 F (36.6 C) (Oral)   Resp (!) 29   Ht 6\' 4"  (1.93 m)   Wt 113.7 kg   SpO2 90%   BMI 30.51 kg/m   Intake/Output Summary (Last 24 hours) at 10/22/2017 1221 Last data filed at 10/22/2017 0730 Gross per 24 hour  Intake 1136.72 ml  Output 1601 ml  Net -464.28 ml   Intake/Output: I/O last 3 completed shifts: In: 1870.7 [P.O.:1320; IV Piggyback:550.7] Out: 2801 [Urine:2800; Stool:1]  Intake/Output this shift:  Total I/O In: 50 [P.O.:50] Out: -  Weight change: -2.3 kg OIN:OMVEHMCNO and less responsive than usual  CVS: tachy Resp: decreased BS at bases Abd: distended, +BS Ext: 1+ edema  Recent Labs  Lab 10/16/17 0653 10/17/17 0430 10/18/17 0419 10/19/17 0306 10/20/17 0433 10/21/17 0356 10/22/17 0349  NA 126* 124* 124* 123* 122* 123* 126*  K 5.4* 5.1 5.2* 5.8* 6.0* 5.2* 4.6  CL 95* 90* 91* 89* 87* 89* 87*  CO2 21* 20* 21* 23 23 19* 24  GLUCOSE 241* 246* 230* 191* 185* 201* 156*  BUN 45* 57* 66* 76* 83* 85* 86*  CREATININE 1.69* 1.77* 1.81* 2.09* 2.11* 2.01* 2.09*  ALBUMIN 2.8* 2.7* 3.0* 2.9* 3.4* 3.4* 3.5  CALCIUM 8.8* 9.1 9.6 9.4 9.9 9.8 10.6*  AST 42* 54* 73* 62* 44* 36 36  ALT 48* 49* 62* 62* 43 32 30   Liver Function Tests: Recent Labs  Lab 10/20/17 0433 10/21/17 0356 10/22/17 0349  AST 44* 36 36  ALT 43 32 30  ALKPHOS 58 51 54  BILITOT 1.4* 1.3* 1.3*  PROT 6.0* 5.7* 6.1*  ALBUMIN 3.4* 3.4* 3.5   No results for input(s): LIPASE, AMYLASE in the last 168 hours. No results for input(s): AMMONIA in the last 168 hours. CBC: Recent Labs  Lab 10/17/17 0430 10/19/17 0306 10/20/17 0433 10/21/17 0356 10/22/17 0349  WBC 16.0* 17.0* 15.4* 16.0* 15.9*  NEUTROABS  --   --   --   --  12.8*  HGB 14.1 13.7 12.5* 12.6* 12.9*  HCT 42.3 40.6 37.2* 37.8* 38.8*  MCV 87.6 86.9 86.7 86.9 86.4  PLT 393 405* 342 315 333   Cardiac Enzymes: No  results for input(s): CKTOTAL, CKMB, CKMBINDEX, TROPONINI in the last 168 hours. CBG: Recent Labs  Lab 10/20/17 1631 10/21/17 0634 10/21/17 2057 10/22/17 0606 10/22/17 0949  GLUCAP 210* 194* 187* 170* 174*    Iron Studies: No results for input(s): IRON, TIBC, TRANSFERRIN, FERRITIN in the last 72 hours. Studies/Results: Ct Abdomen Pelvis Wo Contrast  Result Date: 10/21/2017 CLINICAL DATA:  Acute abdominal pain, decreased oral intake. History of cirrhosis. Assess mesenteric mass. Status post paracentesis of temper 01/2018, spine surgery, small bowel resection. EXAM: CT ABDOMEN AND PELVIS WITHOUT CONTRAST TECHNIQUE: Multidetector CT imaging of the abdomen and pelvis was performed following the standard protocol without IV contrast. Oral contrast administered. COMPARISON:  CT abdomen and pelvis October 14, 2017 FINDINGS: LOWER CHEST: Small pleural effusions, incompletely imaged. The visualized heart size is normal. No pericardial effusion. Severe coronary artery calcification. HEPATOBILIARY: Nodular cirrhotic liver.  Normal gallbladder. PANCREAS: Nonacute. SPLEEN: Normal. ADRENALS/URINARY TRACT: Kidneys are orthotopic, demonstrating normal size and morphology. 23 mm exophytic simple cyst upper pole RIGHT kidney. 12 mm exophytic cyst LEFT kidney. No nephrolithiasis, hydronephrosis; limited assessment for renal masses by nonenhanced CT. The unopacified ureters are normal in course and  caliber. Urinary bladder is decompressed by Foley catheter. 12 mm LEFT benign adrenal adenoma. STOMACH/BOWEL: The stomach, small and large bowel are normal in course and caliber without inflammatory changes, enteric contrast has not yet reached the distal small bowel. VASCULAR/LYMPHATIC: Aortoiliac vessels are normal in course and caliber. Mild calcific atherosclerosis. No lymphadenopathy by CT size criteria. REPRODUCTIVE: Normal. OTHER: 3.7 x 5.7 x 7.3 cm mesenteric mass encasing third portion duodenum and contiguous with  pancreatic head. Similar moderate to large volume low-density ascites. Mesenteric edema. Omental nodularity. MUSCULOSKELETAL: Non-acute. Moderate to severe lower lumbar facet arthropathy. IMPRESSION: 1. 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 Differential diagnosis includes pancreatic neoplasm, GI tumor, carcinoid or lymphoma. 2. Similar moderate to large volume ascites. Similar omental nodularity concerning for carcinomatosis. 3. Cirrhosis. Aortic Atherosclerosis (ICD10-I70.0). Electronically Signed   By: Elon Alas M.D.   On: 10/21/2017 18:45   . aspirin EC  81 mg Oral Daily  . buPROPion  300 mg Oral Daily  . feeding supplement (GLUCERNA SHAKE)  237 mL Oral BID BM  . feeding supplement (PRO-STAT SUGAR FREE 64)  30 mL Oral BID  . heparin injection (subcutaneous)  5,000 Units Subcutaneous Q8H  . insulin aspart  0-15 Units Subcutaneous TID WC  . insulin aspart  10 Units Subcutaneous TID WC  . insulin detemir  22 Units Subcutaneous QHS  . lactulose  10 g Oral BID  . midodrine  10 mg Oral TID WC  . naloxegol oxalate  12.5 mg Oral Daily  . octreotide  100 mcg Subcutaneous TID  . patiromer  25.2 g Oral Daily  . prasugrel  10 mg Oral Daily  . psyllium  1 packet Oral BID  . rifaximin  550 mg Oral BID  . senna-docusate  1 tablet Oral BID  . zolpidem  5 mg Oral QHS    BMET    Component Value Date/Time   NA 126 (L) 10/22/2017 0349   NA 138 12/11/2016 0931   K 4.6 10/22/2017 0349   K 4.1 12/11/2016 0931   CL 87 (L) 10/22/2017 0349   CO2 24 10/22/2017 0349   CO2 28 12/11/2016 0931   GLUCOSE 156 (H) 10/22/2017 0349   GLUCOSE 128 12/11/2016 0931   BUN 86 (H) 10/22/2017 0349   BUN 13.1 12/11/2016 0931   CREATININE 2.09 (H) 10/22/2017 0349   CREATININE 0.89 09/16/2017 1118   CREATININE 0.8 12/11/2016 0931   CALCIUM 10.6 (H) 10/22/2017 0349   CALCIUM 9.6 12/11/2016 0931   GFRNONAA 31 (L) 10/22/2017 0349    GFRNONAA >60 09/16/2017 1118   GFRAA 36 (L) 10/22/2017 0349   GFRAA >60 09/16/2017 1118   CBC    Component Value Date/Time   WBC 15.9 (H) 10/22/2017 0349   RBC 4.49 10/22/2017 0349   HGB 12.9 (L) 10/22/2017 0349   HGB 13.8 09/16/2017 1118   HGB 13.4 12/11/2016 0931   HCT 38.8 (L) 10/22/2017 0349   HCT 40.9 12/11/2016 0931   PLT 333 10/22/2017 0349   PLT 132 (L) 09/16/2017 1118   PLT 116 (L) 12/11/2016 0931   MCV 86.4 10/22/2017 0349   MCV 89.5 12/11/2016 0931   MCH 28.7 10/22/2017 0349   MCHC 33.2 10/22/2017 0349   RDW 15.0 10/22/2017 0349   RDW 14.3 12/11/2016 0931   LYMPHSABS 1.5 10/22/2017 0349   LYMPHSABS 1.1 12/11/2016 0931   MONOABS 1.5 (H) 10/22/2017 0349   MONOABS 0.4 12/11/2016 0931  EOSABS 0.0 10/22/2017 0349   EOSABS 0.1 12/11/2016 0931   BASOSABS 0.0 10/22/2017 0349   BASOSABS 0.0 12/11/2016 0931   Assessment/Plan: 1. AKI- in setting of worsening ascites and s/p multiple large volume paracenteses. Low FeNa consistent with possible hepatorenal but could also be related to intravascular volume depletion following paracenteses and low albumin. Also a component of BOO cannot be ruled out given his symptoms of retention and hesitancy. Agree with holding aldactone.  1. foley cath placed9/16/19 with 300 cc's of urine 2. octreotide and midodrinestarted 9/16/19given AKI and hypotension and possible hepatorenal syndrome..    3. continue to follow UOP and Cr. 4. Limit volume of future paracentesis to no more than 3 liters to limit worsening renal injury 5. I spoke with Mr. Reinig and his son about his worsening renal function and discussed RRT. He would be willing to try HD, however I did state that this is only a temporary measure if he does have a malignancy and/or has hepatorenal syndrome. Thankfully his renal function and potassium are improved and will hold off on HD access placement 6. Responding to IV lasix to 160mg  tid. 2. STEMI- s/p PCI and DES in LAD.  Stable and on dual antiplatelet therapy. 3. Ascites/cirrhosis/abdominal mass- sudden onset following cardiac cath/cardiogenic shock. ?portal HTN vs malignant ascites. Cytology negative and GI following.  1. Repeat US without evidence of ascites, but CT scan with moderate to large amounts.   2. Need to limit volume of any future paracentesis to no more than 3 liters due to AKI and intravascular volume depletion. Also will need albumin IV with paracentesis. And likely will require CT guidance given inability to find adequate window with [place] 3. Need to discuss with GI best way to biopsy mass for diagnosis  4. Hyponatremia due to Ascites  5. Hyperkalemia- due to #1 and off of aldactone. improved with IV lasix and veltassa. 1. Can't give IVF's given significant ascites.  Improving slowly 6. Severe protein malnutrition on supplements 7. DM- per primary 8. AMS- new today would recommend ABG to r/o hypercarbia as etiology and NH3 9. Disposition- poor prognosis if this is malignant ascites or hepatorenal syndrome. Appreciate palliative care consult to help set goals/limits of care.  Pt and family considering hospice.  Donetta Potts, MD [employer] [phone]

## 2017-10-22 NOTE — Progress Notes (Signed)
Palliative: Mr. Harbaugh is lying quietly in bed.  His son Kasandra Knudsen and a friend are at bedside.  Mr. Calvillo is alert,  but does not make eye contact.  He is oriented to person and place.  Mr. Hao tells me that he feels his breathing is okay, that he is okay.  We talked about GI doctor's visit this morning.  Mr. Vanterpool tells me that he has not seen the GI doctor.  As we continue our conversation he does endorse that GI has told him that time is likely short.  We review CT results.  I talked about "hard news".  I shared that the medical team feels that this is cancer, I ask if this would suppress Mr. Dobosz.  He states that he is not surprised.  I share that the medical team has optimize treatment, and although there is no further medical treatment to change what is happening, we will continue to care for him.  We talked about options including residential hospice.  Son Kasandra Knudsen at bedside continues to endorse that they are unable to take Mr. Urwin home.  Kasandra Knudsen also realizes that Mr. Donley, at this point is unable to participate in rehab.  Talk about Pleurx drain, paracentesis.  During this conversation hospitalist, Dr. Erlinda Hong arrives.  We both share that the concern with removing abdominal fluid is continued damage to the kidneys.  We try to elicit what is important to Mr. Grayer, what he wants this time to look like, feel like.  We also talk again today about preferred place of death, I ask if he is considered what is important to him.  He tells me that he has not had enough time to process his options.  I share that he continues to be at risk for further decline, and that at some point his son Kasandra Knudsen may be called to speak for him.  Mr. Schmader shares that he needs to make Kasandra Knudsen his power of attorney.  I share that Kasandra Knudsen, as his only child, already has the right to make choices for Mr. Carswell.  Dr. Erlinda Hong also discusses the severity of illness, Mr. Bollen need for 24/7 care.  We share that we will give patient and  family more time to process what is happening.  Mr. Guthrie had been in relatively stable health, good functional status prior to this illness.  I share that Mr. Taaffe has the right to decline further treatment if he wishes, he does not have to have lab draws or take medicines that he does not want.  Dr. Erlinda Hong states to continue discussions with patient and family.  Goals of care have been discussed at length and thoroughly.  PMT will continue to shadow chart.  Anticipate that Mr. Blaze condition will worsen over the next few days.  At that point, Kasandra Knudsen will be called upon for decision relating to residential hospice.  35 minutes Quinn Axe, NP Palliative medicine team [phone]

## 2017-10-22 NOTE — Progress Notes (Signed)
  Pt has been chocked and coughed after drinking thin liquid. Recommend nectar thick and puree diet for now. Pt has very poor apatite, He can eat less than 10% of the try each meal and refuses to take any nutrition supplements a order. Please crunch pills and give to Pt with apple sauce.    Kennyth Lose, RN

## 2017-10-22 NOTE — Progress Notes (Addendum)
Went to give patient his H.S. Meds. Resp tachy 30 skin warm and dry S.O.B  audible rhonchi . Then vomited up some amt of Coke given previously by son without [place] in it. Patient threw up his Ambien. Did not given him anymore p.o. Meds. R.N. Aware and into see patient. I Enc. I.S. Poor effort. Resp. Call to do breathing treatment. Patient has congested cough but unable to get it up. Patient felt better after breathing tx. Silas [place]  N.P. Paged at 22:55 and call return see orders. Patient aware NPO now and is agreeable to it at this time.

## 2017-10-22 NOTE — Progress Notes (Signed)
   10/22/17 1404  Clinical Encounter Type  Visited With Patient  Visit Type Initial  Referral From Nurse  Stress Factors  Patient Stress Factors Exhausted   Responded to AD consult from PT. Nurse stated son was not available at the moment to complete AD. PT was alert and also wanted a revisit after son returns. Chaplain available as needed.   Chaplain Fidel Levy

## 2017-10-22 NOTE — Progress Notes (Signed)
Subjective: "Feeling better."  Objective: Vital signs in last 24 hours: Temp:  [97.6 F (36.4 C)-98.4 F (36.9 C)] 97.9 F (36.6 C) (09/20 0312) Pulse Rate:  [52-127] 121 (09/20 0312) Resp:  [20-31] 25 (09/20 0312) BP: (105-122)/(67-88) 115/88 (09/20 0312) SpO2:  [81 %-96 %] 95 % (09/20 0414) Weight:  [113.7 kg] 113.7 kg (09/20 0312) Last BM Date: 10/21/17  Intake/Output from previous day: 09/19 0701 - 09/20 0700 In: 1770.7 [P.O.:1320; IV Piggyback:450.7] Out: 2101 [Urine:2100; Stool:1] Intake/Output this shift: No intake/output data recorded.  General appearance: fatigued, difficulty with inspiration GI: distended, firm - changed from yesterday  Lab Results: Recent Labs    10/20/17 0433 10/21/17 0356 10/22/17 0349  WBC 15.4* 16.0* 15.9*  HGB 12.5* 12.6* 12.9*  HCT 37.2* 37.8* 38.8*  PLT 342 315 333   BMET Recent Labs    10/20/17 0433 10/21/17 0356 10/22/17 0349  NA 122* 123* 126*  K 6.0* 5.2* 4.6  CL 87* 89* 87*  CO2 23 19* 24  GLUCOSE 185* 201* 156*  BUN 83* 85* 86*  CREATININE 2.11* 2.01* 2.09*  CALCIUM 9.9 9.8 10.6*   LFT Recent Labs    10/22/17 0349  PROT 6.1*  ALBUMIN 3.5  AST 36  ALT 30  ALKPHOS 54  BILITOT 1.3*   PT/INR No results for input(s): LABPROT, INR in the last 72 hours. Hepatitis Panel No results for input(s): HEPBSAG, HCVAB, HEPAIGM, HEPBIGM in the last 72 hours. C-Diff No results for input(s): CDIFFTOX in the last 72 hours. Fecal Lactopherrin No results for input(s): FECLLACTOFRN in the last 72 hours.  Studies/Results: Ct Abdomen Pelvis Wo Contrast  Result Date: 10/21/2017 CLINICAL DATA:  Acute abdominal pain, decreased oral intake. History of cirrhosis. Assess mesenteric mass. Status post paracentesis of temper 01/2018, spine surgery, small bowel resection. EXAM: CT ABDOMEN AND PELVIS WITHOUT CONTRAST TECHNIQUE: Multidetector CT imaging of the abdomen and pelvis was performed following the standard protocol without IV  contrast. Oral contrast administered. COMPARISON:  CT abdomen and pelvis October 14, 2017 FINDINGS: LOWER CHEST: Small pleural effusions, incompletely imaged. The visualized heart size is normal. No pericardial effusion. Severe coronary artery calcification. HEPATOBILIARY: Nodular cirrhotic liver.  Normal gallbladder. PANCREAS: Nonacute. SPLEEN: Normal. ADRENALS/URINARY TRACT: Kidneys are orthotopic, demonstrating normal size and morphology. 23 mm exophytic simple cyst upper pole RIGHT kidney. 12 mm exophytic cyst LEFT kidney. No nephrolithiasis, hydronephrosis; limited assessment for renal masses by nonenhanced CT. The unopacified ureters are normal in course and caliber. Urinary bladder is decompressed by Foley catheter. 12 mm LEFT benign adrenal adenoma. STOMACH/BOWEL: The stomach, small and large bowel are normal in course and caliber without inflammatory changes, enteric contrast has not yet reached the distal small bowel. VASCULAR/LYMPHATIC: Aortoiliac vessels are normal in course and caliber. Mild calcific atherosclerosis. No lymphadenopathy by CT size criteria. REPRODUCTIVE: Normal. 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 Similar moderate to large volume low-density ascites. Mesenteric edema. Omental nodularity. MUSCULOSKELETAL: Non-acute. Moderate to severe lower lumbar facet arthropathy. IMPRESSION: 1. 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 Differential diagnosis includes pancreatic neoplasm, GI tumor, carcinoid or lymphoma. 2. Similar moderate to large volume ascites. Similar omental nodularity concerning for carcinomatosis. 3. Cirrhosis. Aortic Atherosclerosis (ICD10-I70.0). Electronically Signed   By: Elon Alas M.D.   On: 10/21/2017 18:45   Ir Abdomen US Limited  Result Date: 10/20/2017 CLINICAL DATA:  Cirrhosis, recurrent abdominal  ascites EXAM:  LIMITED ABDOMEN ULTRASOUND FOR ASCITES TECHNIQUE: Limited ultrasound survey for ascites was performed in all four abdominal quadrants. COMPARISON:  10/16/2017 and previous FINDINGS: Small amount of scattered abdominal ascites. No large pocket for safe therapeutic paracentesis. IMPRESSION: Minimal ascites.  Paracentesis deferred. Electronically Signed   By: Lucrezia Europe M.D.   On: 10/20/2017 13:32    Medications:  Scheduled: . aspirin EC  81 mg Oral Daily  . buPROPion  300 mg Oral Daily  . feeding supplement (GLUCERNA SHAKE)  237 mL Oral BID BM  . feeding supplement (PRO-STAT SUGAR FREE 64)  30 mL Oral BID  . heparin injection (subcutaneous)  5,000 Units Subcutaneous Q8H  . insulin aspart  0-15 Units Subcutaneous TID WC  . insulin aspart  10 Units Subcutaneous TID WC  . insulin detemir  22 Units Subcutaneous QHS  . midodrine  10 mg Oral TID WC  . naloxegol oxalate  12.5 mg Oral Daily  . octreotide  100 mcg Subcutaneous TID  . patiromer  25.2 g Oral Daily  . polyethylene glycol  17 g Oral BID  . prasugrel  10 mg Oral Daily  . psyllium  1 packet Oral BID  . senna-docusate  1 tablet Oral BID  . zolpidem  5 mg Oral QHS   Continuous: . albumin human 25 g (10/21/17 1013)  . 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 2) Cirrhosis. 3) STEMI. 4) Mesenteric mass.   The CT scan from yesterday was reviewed.  There is the suggestion of carcinomatosis.  It has been very difficult managing Daniel Rivers.  I had held out hope that this issue with the ascites was from portal HTN, but with the rapid recurrence, mesenteric mass, and the suggestion of carcinomatosis, all point to a malignant issue.  He has declined overall interspersed with some moments of improvement.  Nursing reports that he choked drinking the oral contrast for the CT scan yesterday and she was concerned about aspiration.  Unfortunately there does not seem to be any other medical option for  treatment.  Palliative Care evaluated the patient yesterday.    Plan: 1) Hospice care. 2) Consider stopping the Lovenox injections as nursing reports persistent bleeding.  LOS: 17 days   Daniel Rivers D 10/22/2017, 8:08 AM

## 2017-10-22 NOTE — Progress Notes (Signed)
PROGRESS NOTE  Daniel Rivers PNT:[id number] DOB: 13-Jul-1950 DOA: 10/19/2017 PCP: [name], MD  Brief Summary:  67 year old M w/ a hx of ankylosing spondylitis, anxiety, BPH, chronic back pain, CAD, GERD, dyslipidemia, obesity, nonalcoholic cirrhosis, hemochromatosis, and DM2 who was admitted by Cardiology on 10/04/2017 for a STEMI requiring a stent placement. Subsequently he developed abdominal pain and distention and was found to have ascites, GI was consulted. The patient was transferred to the [facility] service on 10/09/2017 for management of his medical issues. The patient has recurrent ascites, he is hypotensive and tachycardic. He has renal failure, nephrology following Poor prognosis, palliative care consulted  Significant Events: 10/04/2017 -Left heart catheterization with LADDESplacement  10/17/2017 - TTE -EF 35% to 40%.Severe hypokinesis of the anteroseptal and apical myocardium - grade 2 diastolic dysfunction 0/8/67 [place] -Cirrhosis of the liver - small amount of abdominal ascites - 1.4 cm hypoechoic lesion upper pole left kidney, complex cysticversus small solid nodule 10/27/2017  CTchest  No pulmonary emboli 10/08/17 TTE -mild LVH - EF 45% - akinesis of the apical septal wall, the apicalanterior wall, and the true apex 10/08/17 - Ultrasound-guided paracentesis by IR yielding 1 L of fluid.Neutrophil count close to 200, SAAG score cannot be calculated as there was no serum albumin from that day. 10/08/17 CTpotential mass lesion in the central mesentery positioned between the SMV and second portion duodenum. Consider endoscopic ultrasound (EUS) evaluation and tumor sampling. FDG PET scan may be beneficial on outpatient basis.  10/11/2017 Repeat paracentesis not done due to "no ascites on [place]" 9/11/2019CT stat ordered, study completed 10/14/2017 showing large ascites. 10/14/2017 repeat paracentesis 10/16/2017 repeat paracentesis - 5.1L  HPI/Recap of past 24 hours:  Patient reports feeling tired, he  is lethargic, but oriented x3 no fever Abdomen is distended, he denies ab pain, no chest pain 2liter urine output last 24hrs He has been on 3liter oxygen supplement for the last several days  He started to decline oral meds and sub q injections  Son at bedside  Assessment/Plan: Principal Problem:   Acute MI, anterolateral wall, initial episode of care [facility]) Active Problems:   STEMI (ST elevation myocardial infarction) ([place])   Malnutrition of moderate degree   Ascites   Abdominal distension   Abdominal distention   Shortness of breath   Cirrhosis of liver with ascites ([place])   Goals of care, counseling/discussion   Palliative care by specialist   Encounter for hospice care discussion   Ascites , 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  -Will add rifaximin/lactulose until patient is ready for full comfort measures, son and patient would like to have ammonia level checked  AKI:  Hepatorenal? On midodrine/octreotide/ lasix drip/albumin iv daily Cr 2.09, 2liter urine output last 24hrs Will follow nephrology recommendation   Hyperkalemia On patiromer k is improving  hyponatremia in the setting of cirrhosis/ascites and renal failure Sodium is 126 today   Insulin dependent dm2 a1c 6.9 On insulin He is on lasix drip with d5, he is also started on lactulose, will close monitor blood glucose, adjust insulin prn  CAD/STEMI (presenting  symptom) -he presented to the hospital on 9/3 with chest pain, he is  admitted to cardiology Dr Irven Shelling service on presentation - s/p cath and stent placement on 9/3 -he has post cath ekg changes, repeat cath on 9/5 with patent stent -he is currently on asa/parsugrel -cardiology last note on 9/13.  Body mass index is 30.51 kg/m.   FTT:  poor prognosis Palliative care consulted, son can not take patient's home, son and patient is considering residential hospice, but need further discussion Patient prefer continue on current treatment and continue check labs    Code Status: DNR  Family Communication: patient , son   Disposition Plan: likely will need hospice, need continue goals of care discussion   Consultants:  Cardiology  Nephrology  GI palliative care  Procedures:  Cardiac cath and stent placement  Multiple paracentesis  Antibiotics:  *   Objective: BP 115/88 (BP Location: Right Arm)   Pulse (!) 131   Temp 97.9 F (36.6 C) (Oral)   Resp (!) 29   Ht 6\' 4"  (1.93 m)   Wt 113.7 kg   SpO2 90%   BMI 30.51 kg/m   Intake/Output Summary (Last 24 hours) at 10/22/2017 1202 Last data filed at 10/22/2017 0730 Gross per 24 hour  Intake 1136.72 ml  Output 1601 ml  Net -464.28 ml   Filed Weights   10/20/17 0503 10/21/17 0405 10/22/17 0312  Weight: 118.9 kg 116 kg 113.7 kg    Exam: Patient is examined daily including today on 10/22/2017, exams remain the same as of yesterday except that has changed    General:  Pale, frail, chronically ill, oriented x3  Cardiovascular: sinus tachycardia  Respiratory: diminished at basis  Abdomen: distended, nontender, no guarding, positive BS  Musculoskeletal: +Edema  Neuro: lethargic but oriented x3  Data Reviewed: Basic Metabolic Panel: Recent Labs  Lab 10/18/17 0419 10/19/17 0306 10/20/17 0433 10/21/17 0356 10/22/17 0349  NA 124* 123* 122* 123* 126*  K 5.2* 5.8* 6.0* 5.2* 4.6  CL 91* 89* 87* 89* 87*  CO2 21* 23 23 19* 24  GLUCOSE 230* 191* 185* 201* 156*  BUN 66* 76* 83*  85* 86*  CREATININE 1.81* 2.09* 2.11* 2.01* 2.09*  CALCIUM 9.6 9.4 9.9 9.8 10.6*  MG  --   --  2.6*  --   --    Liver Function Tests: Recent Labs  Lab 10/18/17 0419 10/19/17 0306 10/20/17 0433 10/21/17 0356 10/22/17 0349  AST 73* 62* 44* 36 36  ALT 62* 62* 43 32 30  ALKPHOS 64 66 58 51 54  BILITOT 1.2 1.1 1.4* 1.3* 1.3*  PROT 5.6* 5.6* 6.0* 5.7* 6.1*  ALBUMIN 3.0* 2.9* 3.4* 3.4* 3.5   No results for input(s): LIPASE, AMYLASE in the last 168 hours. No results for input(s): AMMONIA in the last 168 hours. CBC: Recent Labs  Lab 10/17/17 0430 10/19/17 0306 10/20/17 0433 10/21/17 0356 10/22/17 0349  WBC 16.0* 17.0* 15.4* 16.0* 15.9*  NEUTROABS  --   --   --   --  12.8*  HGB 14.1 13.7 12.5* 12.6* 12.9*  HCT 42.3 40.6 37.2* 37.8* 38.8*  MCV 87.6 86.9 86.7 86.9 86.4  PLT 393 405* 342 315 333   Cardiac Enzymes:   No results for input(s): CKTOTAL, CKMB, CKMBINDEX, TROPONINI in the last 168 hours. BNP (last 3 results) Recent Labs    10/28/2017 0240  BNP 135.9*    ProBNP (last 3 results) No results for input(s): PROBNP in the last 8760 hours.  CBG: Recent Labs  Lab 10/20/17 1631 10/21/17 0634 10/21/17 2057 10/22/17 0606 10/22/17 0949  GLUCAP 210* 194* 187* 170* 174*    Recent Results (from the past 240 hour(s))  Gram stain     Status: None   Collection Time: 10/13/17 12:44 PM  Result Value Ref Range Status   Specimen Description PLEURAL RIGHT  Final   Special Requests NONE  Final   Gram Stain   Final    RARE WBC PRESENT, PREDOMINANTLY PMN NO ORGANISMS SEEN Performed at [facility], [place] [address], [place], [place] [address]    Report Status 10/13/2017 FINAL  Final  Culture, body fluid-bottle     Status: None   Collection Time: 10/13/17 12:44 PM  Result Value Ref Range Status   Specimen Description PLEURAL RIGHT  Final   Special Requests NONE  Final   Culture   Final    NO GROWTH 5 DAYS Performed at [facility] [address]  [place], [place] [address]    Report Status 10/18/2017 FINAL  Final     Studies: Ct Abdomen Pelvis Wo Contrast  Result Date: 10/21/2017 CLINICAL DATA:  Acute abdominal pain, decreased oral intake. History of cirrhosis. Assess mesenteric mass. Status post paracentesis of temper 01/2018, spine surgery, small bowel resection. EXAM: CT ABDOMEN AND PELVIS WITHOUT CONTRAST TECHNIQUE: Multidetector CT imaging of the abdomen and pelvis was performed following the standard protocol without IV contrast. Oral contrast administered. COMPARISON:  CT abdomen and pelvis October 14, 2017 FINDINGS: LOWER CHEST: Small pleural effusions, incompletely imaged. The visualized heart size is normal. No pericardial effusion. Severe coronary artery calcification. HEPATOBILIARY: Nodular cirrhotic liver.  Normal gallbladder. PANCREAS: Nonacute. SPLEEN: Normal. ADRENALS/URINARY TRACT: Kidneys are orthotopic, demonstrating normal size and morphology. 23 mm exophytic simple cyst upper pole RIGHT kidney. 12 mm exophytic cyst LEFT kidney. No nephrolithiasis, hydronephrosis; limited assessment for renal masses by nonenhanced CT. The unopacified ureters are normal in course and caliber. Urinary bladder is decompressed by Foley catheter. 12 mm LEFT benign adrenal adenoma. STOMACH/BOWEL: The stomach, small and large bowel are normal in course and caliber without inflammatory changes, enteric contrast has not yet reached the distal small bowel. VASCULAR/LYMPHATIC: Aortoiliac vessels are normal in course and caliber. Mild calcific atherosclerosis. No lymphadenopathy by CT size criteria. REPRODUCTIVE: Normal. 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 Similar moderate to large volume low-density ascites. Mesenteric edema. Omental nodularity. MUSCULOSKELETAL: Non-acute. Moderate to severe lower lumbar facet arthropathy. IMPRESSION: 1. Stable appearance of 3.7 x 5.7 x 7.3 cm central  mesenteric mass, encasing the duodenum, contiguous with pancreas. Differential diagnosis includes pancreatic neoplasm, GI tumor, carcinoid or lymphoma. 2. Similar moderate to large volume ascites. Similar omental nodularity concerning for carcinomatosis. 3. Cirrhosis. Aortic Atherosclerosis (ICD10-I70.0). Electronically Signed   By: Elon Alas M.D.   On: 10/21/2017 18:45    Scheduled Meds: . aspirin EC  81 mg Oral Daily  . buPROPion  300 mg Oral Daily  . feeding supplement (GLUCERNA SHAKE)  237 mL Oral BID BM  . feeding supplement (PRO-STAT SUGAR FREE 64)  30 mL Oral BID  . heparin injection (subcutaneous)  5,000 Units Subcutaneous Q8H  . insulin aspart  0-15 Units Subcutaneous TID WC  . insulin aspart  10 Units Subcutaneous TID WC  . insulin detemir  22 Units Subcutaneous QHS  . lactulose  10 g Oral BID  . midodrine  10 mg Oral TID WC  .  naloxegol oxalate  12.5 mg Oral Daily  . octreotide  100 mcg Subcutaneous TID  . patiromer  25.2 g Oral Daily  . prasugrel  10 mg Oral Daily  . psyllium  1 packet Oral BID  . rifaximin  550 mg Oral BID  . senna-docusate  1 tablet Oral BID  . zolpidem  5 mg Oral QHS    Continuous Infusions: . albumin human 25 g (10/22/17 1016)  . furosemide 160 mg (10/22/17 1019)     Time spent: >68mins, I had a prolonged discussion regarding goals of care with patient /son at bedside together with palliative care I have personally reviewed and interpreted on  10/22/2017 daily labs, tele strips, imagings as discussed above under date review session and assessment and plans.  I reviewed all nursing notes, pharmacy notes, consultant notes,  vitals, pertinent old records  I have discussed plan of care as described above with RN , patient and family on 10/22/2017   Florencia Reasons MD, PhD  [facility] Pager [phone]. If 7PM-7AM, please contact night-coverage at [email or web address], password [facility] 10/22/2017, 12:02 PM  LOS: 17 days

## 2017-10-22 NOTE — Progress Notes (Signed)
1145 Due to pt's declining health, we will sign off. Graylon Good RN BSN 10/22/2017 11:46 AM

## 2017-10-22 NOTE — Progress Notes (Signed)
Occupational Therapy Treatment Patient Details Name: Daniel Rivers MRN: [id number] DOB: [birth year] Today's Date: 10/22/2017    History of present illness Pt is a 67 y.o. male admitted 10/04/2017 for a STEMI requiring a stent placement. Subsequently developed abdominal pain and distention and was found to have ascites, GI was consulted. Pt with ascites s/p paracentesis 9/6 and 9/12 (removed 6.4 liters). PMH includes ankylosing spondylitis, anxiety, chronic back pain, CAD, obesity, nonalcoholic cirrhosis, DM2.   OT comments  Pt requires (A) for attempts at PO intake. Pt coughing and showing signs of aspiration. Recommend SLP consult or palliative to address risk of aspiration assumed signed consent from family. Pt requesting water but demonstrates poor ability to swallow without coughing in immediate response.    Follow Up Recommendations  SNF;Supervision/Assistance - 24 hour    Equipment Recommendations  Hospital bed;Wheelchair cushion (measurements OT);Wheelchair (measurements OT)    Recommendations for Other Services Speech consult    Precautions / Restrictions Precautions Precautions: Fall Restrictions Weight Bearing Restrictions: No       Mobility Bed Mobility               General bed mobility comments: OOB elevated for PO intake attempts  Transfers                 General transfer comment: OOB with PT this AM and required total +3 per RN staff for return to bed    Balance                                           ADL either performed or assessed with clinical judgement   ADL Overall ADL's : Needs assistance/impaired Eating/Feeding: Maximal assistance Eating/Feeding Details (indicate cue type and reason): pt requires (A) to eat. pt very weak and requires max (A) to place spoon in mouth. pt provided x3 spoons of liquid ( 1 protein / 2 water) pt with coughing after the second and thena gain with the third. Ot stopping and educated patient  risk for aspiration. pt could benefit from SLP or agreement from family for assumed risk of aspiration so that patient can freely drink / eat with risk.  Grooming: Moderate assistance Grooming Details (indicate cue type and reason): requires (A) to bring paper towel to mouth to cough up muscus                               General ADL Comments: in bed and fatigued at this time.      Vision       Perception     Praxis      Cognition Arousal/Alertness: Awake/alert Behavior During Therapy: Flat affect Overall Cognitive Status: Impaired/Different from baseline                                 General Comments: pt requesting to eat and drink and lacks awareness to choking on liquids        Exercises     Shoulder Instructions       General Comments      Pertinent Vitals/ Pain       Pain Assessment: Faces Faces Pain Scale: Hurts even more Pain Location: coughing and grimacing Pain Descriptors / Indicators: Grimacing Pain Intervention(s): Repositioned  Home Living  Prior Functioning/Environment              Frequency  Min 2X/week        Progress Toward Goals  OT Goals(current goals can now be found in the care plan section)  Progress towards OT goals: Not progressing toward goals - comment  Acute Rehab OT Goals Patient Stated Goal: to drink water OT Goal Formulation: With patient Time For Goal Achievement: 10/31/17 Potential to Achieve Goals: Good ADL Goals Pt Will Perform Grooming: with min guard assist;standing Pt Will Perform Upper Body Dressing: with set-up;with supervision;sitting Pt Will Perform Lower Body Dressing: with min guard assist;sit to/from stand Pt Will Transfer to Toilet: with min guard assist;ambulating;bedside commode Pt Will Perform Toileting - Clothing Manipulation and hygiene: with min guard assist;sit to/from stand  Plan Discharge plan needs to be  updated    Co-evaluation                 AM-PAC PT "6 Clicks" Daily Activity     Outcome Measure   Help from another person eating meals?: Total Help from another person taking care of personal grooming?: Total Help from another person toileting, which includes using toliet, bedpan, or urinal?: Total Help from another person bathing (including washing, rinsing, drying)?: Total Help from another person to put on and taking off regular upper body clothing?: Total Help from another person to put on and taking off regular lower body clothing?: Total 6 Click Score: 6    End of Session    OT Visit Diagnosis: Unsteadiness on feet (R26.81);Other abnormalities of gait and mobility (R26.89);Muscle weakness (generalized) (M62.81);Pain   Activity Tolerance Patient limited by fatigue   Patient Left in bed;with call bell/phone within reach   Nurse Communication Mobility status;Precautions        Time: 1422(1422)-1435 OT Time Calculation (min): 13 min  Charges: OT General Charges $OT Visit: 1 Visit OT Treatments $Self Care/Home Management : 8-22 mins   Jeri Modena, OTR/L  Acute Rehabilitation Services Pager: [phone] Office: [phone] .    Parke Poisson B 10/22/2017, 3:33 PM

## 2017-10-22 NOTE — Progress Notes (Signed)
Physical Therapy Treatment Patient Details Name: Daniel Rivers MRN: [id number] DOB: 08/05/1950 Today's Date: 10/22/2017    History of Present Illness Pt is a 67 y.o. male admitted 10/06/2017 for a STEMI requiring a stent placement. Subsequently developed abdominal pain and distention and was found to have ascites, GI was consulted. Pt with ascites s/p paracentesis 9/6 and 9/12 (removed 6.4 liters). PMH includes ankylosing spondylitis, anxiety, chronic back pain, CAD, obesity, nonalcoholic cirrhosis, DM2.    PT Comments    Pt admitted with above diagnosis. Pt currently with functional limitations due to the deficits listed below (see PT Problem List). Pt was able to transfer to chair with mod assist of 2 persons.  Pt fatigues quickly and low tolerance of activity.  Will follow acutely.   Pt will benefit from skilled PT to increase their independence and safety with mobility to allow discharge to the venue listed below.     Follow Up Recommendations  Supervision/Assistance - 24 hour;SNF(Feel that pt may need SNF unless he has 24 hour care)     Equipment Recommendations  Rolling walker with 5" wheels    Recommendations for Other Services       Precautions / Restrictions Precautions Precautions: Fall Restrictions Weight Bearing Restrictions: No    Mobility  Bed Mobility Overal bed mobility: Needs Assistance Bed Mobility: Supine to Sit     Supine to sit: HOB elevated;Mod assist;+2 for physical assistance     General bed mobility comments: Pt was able to come to EOB with mod assist for LEs and then mod assist of 2 to come to EOB.  Incr time to get pt sitting without mod assist as he had difficulty finding midline and was leaning posteriorly and to right.  Assisted pt to left elbow and pt finally was able to sit with min guard assist in midline with head and neck still flexed.   Transfers Overall transfer level: Needs assistance   Transfers: Sit to/from Stand Sit to Stand: Mod  assist;+2 physical assistance         General transfer comment: Mod A for power up into standing and then used STedy to get pt to recliner.  Pt reliant on UE support on STedy.   Pt also not able to stand fully upright.  Pt with flexed posture in sitting needing assist to sit up on STedy.    Ambulation/Gait                 Stairs             Wheelchair Mobility    Modified Rankin (Stroke Patients Only)       Balance Overall balance assessment: Needs assistance Sitting-balance support: Feet supported;Bilateral upper extremity supported Sitting balance-Leahy Scale: Poor Sitting balance - Comments: Needed mod assist initially to sit and then progressed to min guard assist.    Standing balance support: Bilateral upper extremity supported Standing balance-Leahy Scale: Poor Standing balance comment: Pt reliant on UE support and requiring Mod A for standing                            Cognition Arousal/Alertness: Awake/alert Behavior During Therapy: WFL for tasks assessed/performed Overall Cognitive Status: Impaired/Different from baseline Area of Impairment: Memory;Following commands;Safety/judgement;Awareness;Problem solving;Attention                   Current Attention Level: Sustained Memory: Decreased short-term memory Following Commands: Follows one step commands with increased time Safety/Judgement: Decreased  awareness of safety Awareness: Intellectual Problem Solving: Slow processing;Requires verbal cues;Decreased initiation;Difficulty sequencing;Requires tactile cues General Comments: Pt with slow processing and requiring increased time and cues throughout session.       Exercises      General Comments        Pertinent Vitals/Pain Pain Assessment: Faces Faces Pain Scale: Hurts even more Pain Location: Bilateral hips Pain Descriptors / Indicators: Constant;Discomfort;Grimacing Pain Intervention(s): Limited activity within  patient's tolerance;Monitored during session;Repositioned    Home Living                      Prior Function            PT Goals (current goals can now be found in the care plan section) Acute Rehab PT Goals Patient Stated Goal: return home Progress towards PT goals: Progressing toward goals(slowly due to prognosis)    Frequency    Min 3X/week      PT Plan Current plan remains appropriate    Co-evaluation              AM-PAC PT "6 Clicks" Daily Activity  Outcome Measure  Difficulty turning over in bed (including adjusting bedclothes, sheets and blankets)?: Unable Difficulty moving from lying on back to sitting on the side of the bed? : Unable Difficulty sitting down on and standing up from a chair with arms (e.g., wheelchair, bedside commode, etc,.)?: Unable Help needed moving to and from a bed to chair (including a wheelchair)?: A Lot Help needed walking in hospital room?: Total Help needed climbing 3-5 steps with a railing? : Total 6 Click Score: 7    End of Session Equipment Utilized During Treatment: Gait belt;Oxygen Activity Tolerance: Patient limited by fatigue Patient left: with call bell/phone within reach;in chair;with chair alarm set Nurse Communication: Mobility status;Need for lift equipment(use Stedy to return pt to bed) PT Visit Diagnosis: Unsteadiness on feet (R26.81);Muscle weakness (generalized) (M62.81);Other abnormalities of gait and mobility (R26.89)     Time: 2620-3559 PT Time Calculation (min) (ACUTE ONLY): 16 min  Charges:  $Therapeutic Activity: 8-22 mins                     Farnam Pager:  [phone]  Office:  [place] 10/22/2017, 11:33 AM

## 2017-10-22 NOTE — Progress Notes (Signed)
   10/22/17 1640  Clinical Encounter Type  Visited With Patient;Family  Visit Type Follow-up  Referral From Nurse  Spiritual Encounters  Spiritual Needs Emotional  Stress Factors  Family Stress Factors Major life changes   Received page to follow up with PT. Spoke with Nurse about the AD and she mentioned son and PT consulted with NP about care to patient. Met son in hallway and he was also mentioning his conversation with the NP. Son stated that he thinks there might not be a need for an AD per his conversation with PT and NP. Comptroller of presence ands words of encouragement to family member.   Chaplain Fidel Levy

## 2017-10-23 LAB — CBC
HCT: 39.2 % (ref 39.0–52.0)
Hemoglobin: 12.8 g/dL — ABNORMAL LOW (ref 13.0–17.0)
MCH: 28.6 pg (ref 26.0–34.0)
MCHC: 32.7 g/dL (ref 30.0–36.0)
MCV: 87.5 fL (ref 78.0–100.0)
Platelets: 328 10*3/uL (ref 150–400)
RBC: 4.48 MIL/uL (ref 4.22–5.81)
RDW: 15.1 % (ref 11.5–15.5)
WBC: 17.1 10*3/uL — AB (ref 4.0–10.5)

## 2017-10-23 LAB — GLUCOSE, CAPILLARY
GLUCOSE-CAPILLARY: 188 mg/dL — AB (ref 70–99)
Glucose-Capillary: 213 mg/dL — ABNORMAL HIGH (ref 70–99)
Glucose-Capillary: 214 mg/dL — ABNORMAL HIGH (ref 70–99)
Glucose-Capillary: 221 mg/dL — ABNORMAL HIGH (ref 70–99)

## 2017-10-23 LAB — COMPREHENSIVE METABOLIC PANEL
ALT: 30 U/L (ref 0–44)
ANION GAP: 17 — AB (ref 5–15)
AST: 34 U/L (ref 15–41)
Albumin: 3.7 g/dL (ref 3.5–5.0)
Alkaline Phosphatase: 55 U/L (ref 38–126)
BUN: 93 mg/dL — ABNORMAL HIGH (ref 8–23)
CHLORIDE: 84 mmol/L — AB (ref 98–111)
CO2: 27 mmol/L (ref 22–32)
CREATININE: 2.35 mg/dL — AB (ref 0.61–1.24)
Calcium: 11.7 mg/dL — ABNORMAL HIGH (ref 8.9–10.3)
GFR, EST AFRICAN AMERICAN: 31 mL/min — AB (ref >60)
GFR, EST NON AFRICAN AMERICAN: 27 mL/min — AB (ref >60)
Glucose, Bld: 203 mg/dL — ABNORMAL HIGH (ref 70–99)
POTASSIUM: 4.7 mmol/L (ref 3.5–5.1)
Sodium: 128 mmol/L — ABNORMAL LOW (ref 135–145)
Total Bilirubin: 1.6 mg/dL — ABNORMAL HIGH (ref 0.3–1.2)
Total Protein: 6.2 g/dL — ABNORMAL LOW (ref 6.5–8.1)

## 2017-10-23 LAB — AMMONIA: Ammonia: 38 umol/L — ABNORMAL HIGH (ref 9–35)

## 2017-10-23 MED ORDER — LORAZEPAM 2 MG/ML IJ SOLN
1.0000 mg | Freq: Once | INTRAMUSCULAR | Status: AC
  Administered 2017-10-23: 1 mg via INTRAVENOUS
  Filled 2017-10-23: qty 1

## 2017-10-23 MED ORDER — DILTIAZEM HCL-DEXTROSE 100-5 MG/100ML-% IV SOLN (PREMIX)
5.0000 mg/h | INTRAVENOUS | Status: DC
  Administered 2017-10-23: 5 mg/h via INTRAVENOUS
  Filled 2017-10-23: qty 100

## 2017-10-23 NOTE — Progress Notes (Addendum)
Patient ID: MATHAN DARROCH, male   DOB: [birth year], 67 y.o.   MRN: [id number] Request received for consideration of tunneled peritoneal drain placement in patient.  Last paracentesis performed on 9/14 yielded 5.1 liters.  Latest imaging studies were reviewed by Dr. Kathlene Cote and at this time patient does not have sufficient fluid for peritoneal drain placement.  He has also had recent coronary stent placement 10/09/2017 and on effient.  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  Details/risks of peritoneal drain placement including but not limited to internal bleeding, infection, injury to adjacent structures, inability to adequately drain fluid were discussed with patient and son today. They will discuss matter further with pall care and we will recheck status next week. With recent coronary stent and need for anticoagulation increased bleeding risks from procedure should be considered (normally a 5-7 day hold with effient before IR invasive procedure). Appropriate management of drain once discharged should also be considered if done.

## 2017-10-23 NOTE — Progress Notes (Signed)
S: He had a choking episode last night after drinking soda without thickener and has had increased oxygen requirements.  He is lethargic and refused his meds this am.   O:BP 123/71   Pulse (!) 125   Temp 97.6 F (36.4 C) (Oral)   Resp (!) 28   Ht 6\' 4"  (1.93 m)   Wt 113.6 kg   SpO2 91%   BMI 30.48 kg/m   Intake/Output Summary (Last 24 hours) at 10/23/2017 1157 Last data filed at 10/23/2017 0844 Gross per 24 hour  Intake 1094.27 ml  Output 1600 ml  Net -505.73 ml   Intake/Output: I/O last 3 completed shifts: In: 1638.2 [P.O.:750; IV Piggyback:888.2] Out: 6967 [Urine:1450; Stool:1]  Intake/Output this shift:  Total I/O In: -  Out: 600 [Urine:600] Weight change: -0.1 kg Gen: lethargic but arousable CVS: no rub Resp: decreased BS at bases Abd: distended, +BS, tense, mild tenderness, no guarding or rebound Ext: 1+ edema and presacral edema, + ascites  Recent Labs  Lab 10/17/17 0430 10/18/17 0419 10/19/17 0306 10/20/17 0433 10/21/17 0356 10/22/17 0349 10/23/17 0247  NA 124* 124* 123* 122* 123* 126* 128*  K 5.1 5.2* 5.8* 6.0* 5.2* 4.6 4.7  CL 90* 91* 89* 87* 89* 87* 84*  CO2 20* 21* 23 23 19* 24 27  GLUCOSE 246* 230* 191* 185* 201* 156* 203*  BUN 57* 66* 76* 83* 85* 86* 93*  CREATININE 1.77* 1.81* 2.09* 2.11* 2.01* 2.09* 2.35*  ALBUMIN 2.7* 3.0* 2.9* 3.4* 3.4* 3.5 3.7  CALCIUM 9.1 9.6 9.4 9.9 9.8 10.6* 11.7*  AST 54* 73* 62* 44* 36 36 34  ALT 49* 62* 62* 43 32 30 30   Liver Function Tests: Recent Labs  Lab 10/21/17 0356 10/22/17 0349 10/23/17 0247  AST 36 36 34  ALT 32 30 30  ALKPHOS 51 54 55  BILITOT 1.3* 1.3* 1.6*  PROT 5.7* 6.1* 6.2*  ALBUMIN 3.4* 3.5 3.7   No results for input(s): LIPASE, AMYLASE in the last 168 hours. Recent Labs  Lab 10/23/17 0247  AMMONIA 38*   CBC: Recent Labs  Lab 10/19/17 0306 10/20/17 0433 10/21/17 0356 10/22/17 0349 10/23/17 0247  WBC 17.0* 15.4* 16.0* 15.9* 17.1*  NEUTROABS  --   --   --  12.8*  --   HGB 13.7  12.5* 12.6* 12.9* 12.8*  HCT 40.6 37.2* 37.8* 38.8* 39.2  MCV 86.9 86.7 86.9 86.4 87.5  PLT 405* 342 315 333 328   Cardiac Enzymes: No results for input(s): CKTOTAL, CKMB, CKMBINDEX, TROPONINI in the last 168 hours. CBG: Recent Labs  Lab 10/22/17 1106 10/22/17 1712 10/22/17 2106 10/23/17 0542 10/23/17 1154  GLUCAP 180* 182* 179* 213* 188*    Iron Studies: No results for input(s): IRON, TIBC, TRANSFERRIN, FERRITIN in the last 72 hours. Studies/Results: Ct Abdomen Pelvis Wo Contrast  Result Date: 10/21/2017 CLINICAL DATA:  Acute abdominal pain, decreased oral intake. History of cirrhosis. Assess mesenteric mass. Status post paracentesis of temper 01/2018, spine surgery, small bowel resection. EXAM: CT ABDOMEN AND PELVIS WITHOUT CONTRAST TECHNIQUE: Multidetector CT imaging of the abdomen and pelvis was performed following the standard protocol without IV contrast. Oral contrast administered. COMPARISON:  CT abdomen and pelvis October 14, 2017 FINDINGS: LOWER CHEST: Small pleural effusions, incompletely imaged. The visualized heart size is normal. No pericardial effusion. Severe coronary artery calcification. HEPATOBILIARY: Nodular cirrhotic liver.  Normal gallbladder. PANCREAS: Nonacute. SPLEEN: Normal. ADRENALS/URINARY TRACT: Kidneys are orthotopic, demonstrating normal size and morphology. 23 mm exophytic simple cyst upper pole  RIGHT kidney. 12 mm exophytic cyst LEFT kidney. No nephrolithiasis, hydronephrosis; limited assessment for renal masses by nonenhanced CT. The unopacified ureters are normal in course and caliber. Urinary bladder is decompressed by Foley catheter. 12 mm LEFT benign adrenal adenoma. STOMACH/BOWEL: The stomach, small and large bowel are normal in course and caliber without inflammatory changes, enteric contrast has not yet reached the distal small bowel. VASCULAR/LYMPHATIC: Aortoiliac vessels are normal in course and caliber. Mild calcific atherosclerosis. No  lymphadenopathy by CT size criteria. REPRODUCTIVE: Normal. 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 Similar moderate to large volume low-density ascites. Mesenteric edema. Omental nodularity. MUSCULOSKELETAL: Non-acute. Moderate to severe lower lumbar facet arthropathy. IMPRESSION: 1. 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 Differential diagnosis includes pancreatic neoplasm, GI tumor, carcinoid or lymphoma. 2. Similar moderate to large volume ascites. Similar omental nodularity concerning for carcinomatosis. 3. Cirrhosis. Aortic Atherosclerosis (ICD10-I70.0). Electronically Signed   By: Elon Alas M.D.   On: 10/21/2017 18:45   Dg Chest Port 1 View  Result Date: 10/23/2017 CLINICAL DATA:  Aspiration EXAM: PORTABLE CHEST 1 VIEW COMPARISON:  10/13/2017 FINDINGS: Hazy opacities are noted at each lung base consistent with layering effusions. Probable superimposed atelectatic lung. Superimposed pneumonia is not excluded. No overt pulmonary edema. Heart size is within normal limits. No aortic aneurysm. There is minimal aortic atherosclerosis. No acute osseous abnormality. IMPRESSION: Hazy opacities are identified at each lung base suspicious for layering pleural effusions. Probable compressive atelectasis as a result. Superimposed pneumonia would be difficult to entirely exclude. Electronically Signed   By: Ashley Royalty M.D.   On: 10/23/2017 00:53   . aspirin EC  81 mg Oral Daily  . buPROPion  300 mg Oral Daily  . feeding supplement (GLUCERNA SHAKE)  237 mL Oral BID BM  . feeding supplement (PRO-STAT SUGAR FREE 64)  30 mL Oral BID  . heparin injection (subcutaneous)  5,000 Units Subcutaneous Q8H  . insulin aspart  0-15 Units Subcutaneous TID WC  . insulin aspart  10 Units Subcutaneous TID WC  . insulin detemir  22 Units Subcutaneous QHS  . lactulose  10 g Oral  BID  . midodrine  10 mg Oral TID WC  . naloxegol oxalate  12.5 mg Oral Daily  . octreotide  100 mcg Subcutaneous TID  . prasugrel  10 mg Oral Daily  . psyllium  1 packet Oral BID  . rifaximin  550 mg Oral BID  . senna-docusate  1 tablet Oral BID  . zolpidem  5 mg Oral QHS    BMET    Component Value Date/Time   NA 128 (L) 10/23/2017 0247   NA 138 12/11/2016 0931   K 4.7 10/23/2017 0247   K 4.1 12/11/2016 0931   CL 84 (L) 10/23/2017 0247   CO2 27 10/23/2017 0247   CO2 28 12/11/2016 0931   GLUCOSE 203 (H) 10/23/2017 0247   GLUCOSE 128 12/11/2016 0931   BUN 93 (H) 10/23/2017 0247   BUN 13.1 12/11/2016 0931   CREATININE 2.35 (H) 10/23/2017 0247   CREATININE 0.89 09/16/2017 1118   CREATININE 0.8 12/11/2016 0931   CALCIUM 11.7 (H) 10/23/2017 0247   CALCIUM 9.6 12/11/2016 0931   GFRNONAA 27 (L) 10/23/2017 0247   GFRNONAA >60 09/16/2017 1118   GFRAA 31 (L) 10/23/2017 0247   GFRAA >60 09/16/2017 1118   CBC    Component Value  Date/Time   WBC 17.1 (H) 10/23/2017 0247   RBC 4.48 10/23/2017 0247   HGB 12.8 (L) 10/23/2017 0247   HGB 13.8 09/16/2017 1118   HGB 13.4 12/11/2016 0931   HCT 39.2 10/23/2017 0247   HCT 40.9 12/11/2016 0931   PLT 328 10/23/2017 0247   PLT 132 (L) 09/16/2017 1118   PLT 116 (L) 12/11/2016 0931   MCV 87.5 10/23/2017 0247   MCV 89.5 12/11/2016 0931   MCH 28.6 10/23/2017 0247   MCHC 32.7 10/23/2017 0247   RDW 15.1 10/23/2017 0247   RDW 14.3 12/11/2016 0931   LYMPHSABS 1.5 10/22/2017 0349   LYMPHSABS 1.1 12/11/2016 0931   MONOABS 1.5 (H) 10/22/2017 0349   MONOABS 0.4 12/11/2016 0931   EOSABS 0.0 10/22/2017 0349   EOSABS 0.1 12/11/2016 0931   BASOSABS 0.0 10/22/2017 0349   BASOSABS 0.0 12/11/2016 0931     Assessment/Plan: 1. AKI- in setting of worsening ascites and s/p multiple large volume paracenteses. Low FeNa consistent with possible hepatorenal but could also be related to intravascular volume depletion following paracenteses and low  albumin. Also a component of BOO cannot be ruled out given his symptoms of retention and hesitancy. Agree with holding aldactone.  1. foley cath placed9/16/19 with 300 cc's of urine 2. octreotide and midodrinestarted 9/16/19given AKI and hypotension and possible hepatorenal syndrome.. 3. continue to follow UOP and Cr. 4. Limit volume of future paracentesis to no more than 3 liters to limit worsening renal injury 5. Cr up somewhat but will not offer HD given his poor prognosis.  He is considering hospice 6. Responding to IVlasix to 160mg  tid. 2. STEMI- s/p PCI and DES in LAD. Stable and on dual antiplatelet therapy. 3. Ascites/cirrhosis/abdominal mass- sudden onset following cardiac cath/cardiogenic shock. ?portal HTN vs malignant ascites. Cytology negativeand GI following.  1. Repeat US without evidence of ascites, but CT scan with moderate to large amounts. 2. Need to limit volume ofany futureparacentesis to no more than 3 liters due to AKI and intravascular volume depletion. Also will need albumin IV with paracentesis. And likely will require CT guidance given inability to find adequate window with [place] 3. Need to discuss with GI best way to biopsy mass for diagnosis 4. Hyponatremia due to Ascites  5. Hyperkalemia- due to #1 and off of aldactone. improved with IV lasix and veltassa 6. Severe protein malnutrition on supplements 7. DM- per primary 8. AMS- new today would recommend ABG to r/o hypercarbia as etiology and NH3 9. Disposition- poor prognosis if this is malignant ascites or hepatorenal syndrome. Appreciate palliative care consult to help set goals/limits of care.  Pt and family considering hospice. Donetta Potts, MD [employer] [phone]

## 2017-10-23 NOTE — Progress Notes (Signed)
PROGRESS NOTE  Daniel Rivers VQM:[id number] DOB: 02-Jul-1950 DOA: 10/17/2017 PCP: [name], MD  Brief Summary:  67 year old M w/ a hx of ankylosing spondylitis, anxiety, BPH, chronic back pain, CAD, GERD, dyslipidemia, obesity, nonalcoholic cirrhosis, hemochromatosis, and DM2 who was admitted by Cardiology on 10/14/2017 for a STEMI requiring a stent placement. Subsequently he developed abdominal pain and distention and was found to have ascites, GI was consulted. The patient was transferred to the [facility] service on 10/09/2017 for management of his medical issues. The patient has recurrent ascites, he is hypotensive and tachycardic. He has renal failure, nephrology following Poor prognosis, palliative care consulted  Significant Events: 10/04/2017 -Left heart catheterization with LADDESplacement  10/08/2017 - TTE -EF 35% to 40%.Severe hypokinesis of the anteroseptal and apical myocardium - grade 2 diastolic dysfunction 10/05/24 [place] -Cirrhosis of the liver - small amount of abdominal ascites - 1.4 cm hypoechoic lesion upper pole left kidney, complex cysticversus small solid nodule 10/05/2017  CTchest  No pulmonary emboli 10/08/17 TTE -mild LVH - EF 45% - akinesis of the apical septal wall, the apicalanterior wall, and the true apex 10/08/17 - Ultrasound-guided paracentesis by IR yielding 1 L of fluid.Neutrophil count close to 200, SAAG score cannot be calculated as there was no serum albumin from that day. 10/08/17 CTpotential mass lesion in the central mesentery positioned between the SMV and second portion duodenum. Consider endoscopic ultrasound (EUS) evaluation and tumor sampling. FDG PET scan may be beneficial on outpatient basis.  10/11/2017 Repeat paracentesis not done due to "no ascites on [place]" 9/11/2019CT stat ordered, study completed 10/14/2017 showing large ascites. 10/14/2017 repeat paracentesis 10/16/2017 repeat paracentesis - 5.1L  HPI/Recap of past 24 hours:  He had a choking episode  yesterday evening after drinking coke without thickener  He is on increased oxygen supplement,  Increased from 3liters to 5liters he has audible gurgling this am  he denies chest pain, he has weak intermittent cough which is not new   Patient reports feeling tired, he refused all am oral meds he is lethargic, he knows he is at [facility], he states it is may   Abdomen is distended, he reports some mild lower abdominal discomfort, no fever  1liter urine output last 24hrs   Son at bedside  Assessment/Plan: Principal Problem:   Acute MI, anterolateral wall, initial episode of care [facility]) Active Problems:   STEMI (ST elevation myocardial infarction) ([place])   Malnutrition of moderate degree   Ascites   Abdominal distension   Abdominal distention   Shortness of breath   Cirrhosis of liver with ascites ([place])   Goals of care, counseling/discussion   Palliative care by specialist   Encounter for hospice care discussion   Ascites , 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  -Will add rifaximin/lactulose until patient is ready for full comfort measures, son and patient would like to have ammonia level checked -ammonia 38  AKI:  Hepatorenal? On midodrine/octreotide/ lasix drip/albumin iv daily Cr 2.35, 1liter urine output last 24hrs Will  follow nephrology recommendation   Hyperkalemia On patiromer k is  improving  hyponatremia in the setting of cirrhosis/ascites and renal failure Sodium is 128today   Insulin dependent dm2 a1c 6.9 On insulin He is on lasix drip with d5, he is also started on lactulose, will close monitor blood glucose, adjust insulin prn  CAD/STEMI (presenting symptom) -he presented to the hospital on 9/3 with chest pain, he is admitted to cardiology Dr Irven Shelling service on presentation - s/p cath and stent placement on 9/3 -he has post cath ekg changes, repeat cath on 9/5 with patent stent -he is currently on asa/parsugrel -cardiology last note on 9/13.  Body mass index is 30.48 kg/m.   FTT:  poor prognosis Palliative care consulted, son can not take patient's home, son and patient is considering residential hospice, but need further discussion Patient prefer continue on current treatment and continue check labs    Code Status: DNR  Family Communication: patient , son   Disposition Plan: likely will need hospice, need continue goals of care discussion   Consultants:  Cardiology  Nephrology  GI palliative care  Procedures:  Cardiac cath and stent placement  Multiple paracentesis  Antibiotics:  none   Objective: BP 123/71   Pulse (!) 125   Temp 97.6 F (36.4 C) (Oral)   Resp (!) 28   Ht 6\' 4"  (1.93 m)   Wt 113.6 kg   SpO2 91%   BMI 30.48 kg/m   Intake/Output Summary (Last 24 hours) at 10/23/2017 1040 Last data filed at 10/23/2017 0844 Gross per 24 hour  Intake 1094.27 ml  Output 1600 ml  Net -505.73 ml   Filed Weights   10/21/17 0405 10/22/17 0312 10/23/17 0341  Weight: 116 kg 113.7 kg 113.6 kg    Exam: Patient is examined daily including today on 10/23/2017, exams remain the same as of yesterday except that has changed    General:  Pale, frail, chronically ill, slightly confused, lethargic   Cardiovascular: sinus tachycardia  Respiratory: diminished at basis  Abdomen: distended, nontender, no guarding, positive  BS  Musculoskeletal: trace pitting Edema  Neuro: lethargic   Data Reviewed: Basic Metabolic Panel: Recent Labs  Lab 10/19/17 0306 10/20/17 0433 10/21/17 0356 10/22/17 0349 10/23/17 0247  NA 123* 122* 123* 126* 128*  K 5.8* 6.0* 5.2* 4.6 4.7  CL 89* 87* 89* 87* 84*  CO2 23 23 19* 24 27  GLUCOSE 191* 185* 201* 156* 203*  BUN 76* 83* 85* 86* 93*  CREATININE 2.09* 2.11* 2.01* 2.09* 2.35*  CALCIUM 9.4 9.9 9.8 10.6* 11.7*  MG  --  2.6*  --   --   --    Liver Function Tests: Recent Labs  Lab 10/19/17 0306 10/20/17 0433 10/21/17 0356 10/22/17 0349 10/23/17 0247  AST 62* 44* 36 36 34  ALT 62* 43 32 30 30  ALKPHOS 66 58 51 54 55  BILITOT 1.1 1.4* 1.3* 1.3* 1.6*  PROT 5.6* 6.0* 5.7* 6.1* 6.2*  ALBUMIN 2.9* 3.4* 3.4* 3.5 3.7   No results for input(s): LIPASE, AMYLASE in the last 168 hours. Recent Labs  Lab 10/23/17 0247  AMMONIA 38*   CBC: Recent Labs  Lab 10/19/17 0306 10/20/17 0433 10/21/17 0356 10/22/17 0349 10/23/17 0247  WBC 17.0* 15.4* 16.0* 15.9* 17.1*  NEUTROABS  --   --   --  12.8*  --   HGB 13.7 12.5* 12.6* 12.9* 12.8*  HCT 40.6 37.2* 37.8* 38.8* 39.2  MCV 86.9 86.7 86.9 86.4 87.5  PLT  405* 342 315 333 328   Cardiac Enzymes:   No results for input(s): CKTOTAL, CKMB, CKMBINDEX, TROPONINI in the last 168 hours. BNP (last 3 results) Recent Labs    10/22/2017 0240  BNP 135.9*    ProBNP (last 3 results) No results for input(s): PROBNP in the last 8760 hours.  CBG: Recent Labs  Lab 10/22/17 0949 10/22/17 1106 10/22/17 1712 10/22/17 2106 10/23/17 0542  GLUCAP 174* 180* 182* 179* 213*    Recent Results (from the past 240 hour(s))  Gram stain     Status: None   Collection Time: 10/13/17 12:44 PM  Result Value Ref Range Status   Specimen Description PLEURAL RIGHT  Final   Special Requests NONE  Final   Gram Stain   Final    RARE WBC PRESENT, PREDOMINANTLY PMN NO ORGANISMS SEEN Performed at [facility], [place] [address],  [place], [place] [address]    Report Status 10/13/2017 FINAL  Final  Culture, body fluid-bottle     Status: None   Collection Time: 10/13/17 12:44 PM  Result Value Ref Range Status   Specimen Description PLEURAL RIGHT  Final   Special Requests NONE  Final   Culture   Final    NO GROWTH 5 DAYS Performed at [facility] [address] [place], [place] [address]    Report Status 10/18/2017 FINAL  Final     Studies: Dg Chest Port 1 View  Result Date: 10/23/2017 CLINICAL DATA:  Aspiration EXAM: PORTABLE CHEST 1 VIEW COMPARISON:  10/13/2017 FINDINGS: Hazy opacities are noted at each lung base consistent with layering effusions. Probable superimposed atelectatic lung. Superimposed pneumonia is not excluded. No overt pulmonary edema. Heart size is within normal limits. No aortic aneurysm. There is minimal aortic atherosclerosis. No acute osseous abnormality. IMPRESSION: Hazy opacities are identified at each lung base suspicious for layering pleural effusions. Probable compressive atelectasis as a result. Superimposed pneumonia would be difficult to entirely exclude. Electronically Signed   By: Ashley Royalty M.D.   On: 10/23/2017 00:53    Scheduled Meds: . aspirin EC  81 mg Oral Daily  . buPROPion  300 mg Oral Daily  . feeding supplement (GLUCERNA SHAKE)  237 mL Oral BID BM  . feeding supplement (PRO-STAT SUGAR FREE 64)  30 mL Oral BID  . heparin injection (subcutaneous)  5,000 Units Subcutaneous Q8H  . insulin aspart  0-15 Units Subcutaneous TID WC  . insulin aspart  10 Units Subcutaneous TID WC  . insulin detemir  22 Units Subcutaneous QHS  . lactulose  10 g Oral BID  . midodrine  10 mg Oral TID WC  . naloxegol oxalate  12.5 mg Oral Daily  . octreotide  100 mcg Subcutaneous TID  . prasugrel  10 mg Oral Daily  . psyllium  1 packet Oral BID  . rifaximin  550 mg Oral BID  . senna-docusate  1 tablet Oral BID  . zolpidem  5 mg Oral QHS    Continuous Infusions: . albumin human 25 g  (10/22/17 1016)  . furosemide 160 mg (10/23/17 0343)     Time spent: >48mins, I had a prolonged discussion regarding goals of care with patient /son at bedside with RN at bedside I have personally reviewed and interpreted on  10/23/2017 daily labs, tele strips, imagings as discussed above under date review session and assessment and plans.  I reviewed all nursing notes, pharmacy notes, consultant notes,  vitals, pertinent old records  I have discussed plan  of care as described above with RN , patient and family on 10/23/2017   Florencia Reasons MD, PhD  [facility] Pager [phone]. If 7PM-7AM, please contact night-coverage at [email or web address], password [facility] 10/23/2017, 10:40 AM  LOS: 18 days

## 2017-10-23 NOTE — Progress Notes (Signed)
O2 sats 87% on 4/l [employer] To o2 to 5/L [employer] Flutter valve poor effort

## 2017-10-23 NOTE — Progress Notes (Signed)
Dr. Erlinda Hong paged at this time. Notified that patient refused all medications this am. Patient states that he's very "weak and tired"  Emelda Fear, RN

## 2017-10-23 NOTE — Progress Notes (Signed)
Text page [facility] N.P. To look at CXR results.

## 2017-10-24 LAB — RENAL FUNCTION PANEL
ALBUMIN: 3.3 g/dL — AB (ref 3.5–5.0)
Anion gap: 17 — ABNORMAL HIGH (ref 5–15)
BUN: 110 mg/dL — ABNORMAL HIGH (ref 8–23)
CALCIUM: 11.3 mg/dL — AB (ref 8.9–10.3)
CO2: 26 mmol/L (ref 22–32)
Chloride: 85 mmol/L — ABNORMAL LOW (ref 98–111)
Creatinine, Ser: 2.31 mg/dL — ABNORMAL HIGH (ref 0.61–1.24)
GFR calc Af Amer: 32 mL/min — ABNORMAL LOW (ref >60)
GFR, EST NON AFRICAN AMERICAN: 28 mL/min — AB (ref >60)
GLUCOSE: 237 mg/dL — AB (ref 70–99)
POTASSIUM: 4.7 mmol/L (ref 3.5–5.1)
Phosphorus: 7.1 mg/dL — ABNORMAL HIGH (ref 2.5–4.6)
Sodium: 128 mmol/L — ABNORMAL LOW (ref 135–145)

## 2017-10-24 LAB — GLUCOSE, CAPILLARY: GLUCOSE-CAPILLARY: 254 mg/dL — AB (ref 70–99)

## 2017-10-24 MED ORDER — AMIODARONE HCL IN DEXTROSE 360-4.14 MG/200ML-% IV SOLN: 30.0000 mg/h | INTRAVENOUS | Status: DC

## 2017-10-24 MED ORDER — AMIODARONE LOAD VIA INFUSION
150.0000 mg | Freq: Once | INTRAVENOUS | Status: DC
  Administered 2017-10-24: 150 mg via INTRAVENOUS

## 2017-10-24 MED ORDER — MORPHINE SULFATE (PF) 2 MG/ML IV SOLN: 1.0000 mg | INTRAVENOUS | Status: DC | PRN

## 2017-10-24 MED ORDER — AMIODARONE HCL IN DEXTROSE 360-4.14 MG/200ML-% IV SOLN
60.0000 mg/h | INTRAVENOUS | Status: DC
  Administered 2017-10-24 (×2): 60 mg/h via INTRAVENOUS
  Filled 2017-10-24 (×2): qty 200

## 2017-10-24 NOTE — Progress Notes (Signed)
  Amiodarone Drug - Drug Interaction Consult Note  Recommendations: Monitor electrolytes closely Amiodarone is metabolized by the cytochrome P450 system and therefore has the potential to cause many drug interactions. Amiodarone has an average plasma half-life of 50 days (range 20 to 100 days).   There is potential for drug interactions to occur several weeks or months after stopping treatment and the onset of drug interactions may be slow after initiating amiodarone.   []  Statins: Increased risk of myopathy. Simvastatin- restrict dose to 20mg  daily. Other statins: counsel patients to report any muscle pain or weakness immediately.  []  Anticoagulants: Amiodarone can increase anticoagulant effect. Consider warfarin dose reduction. Patients should be monitored closely and the dose of anticoagulant altered accordingly, remembering that amiodarone levels take several weeks to stabilize.  []  Antiepileptics: Amiodarone can increase plasma concentration of phenytoin, the dose should be reduced. Note that small changes in phenytoin dose can result in large changes in levels. Monitor patient and counsel on signs of toxicity.  []  Beta blockers: increased risk of bradycardia, AV block and myocardial depression. Sotalol - avoid concomitant use.  []   Calcium channel blockers (diltiazem and verapamil): increased risk of bradycardia, AV block and myocardial depression.  []   Cyclosporine: Amiodarone increases levels of cyclosporine. Reduced dose of cyclosporine is recommended.  []  Digoxin dose should be halved when amiodarone is started.  [x]  Diuretics: increased risk of cardiotoxicity if hypokalemia occurs.  []  Oral hypoglycemic agents (glyburide, glipizide, glimepiride): increased risk of hypoglycemia. Patient's glucose levels should be monitored closely when initiating amiodarone therapy.   []  Drugs that prolong the QT interval:  Torsades de pointes risk may be increased with concurrent use - avoid if  possible.  Monitor QTc, also keep magnesium/potassium WNL if concurrent therapy can't be avoided. [name] Antibiotics: e.g. fluoroquinolones, erythromycin. . Antiarrhythmics: e.g. quinidine, procainamide, disopyramide, sotalol. . Antipsychotics: e.g. phenothiazines, haloperidol.  . Lithium, tricyclic antidepressants, and methadone. Thank You,  Excell Seltzer Poteet  10/24/2017 1:53 AM

## 2017-10-24 NOTE — Progress Notes (Signed)
Lasix held due to low bp's after cardizem. Will continue to monitor.

## 2017-10-24 NOTE — Progress Notes (Signed)
S:Had several episodes of tachycardia and hypotension (ECG sinus tach at 154).  He is more lethargic. O:BP (!) 89/67 (BP Location: Right Arm)   Pulse (!) 151   Temp 98 F (36.7 C) (Oral)   Resp (!) 30   Ht 6\' 4"  (1.93 m)   Wt 109.3 kg   SpO2 (!) 89%   BMI 29.33 kg/m   Intake/Output Summary (Last 24 hours) at 10/24/2017 1134 Last data filed at 10/24/2017 0921 Gross per 24 hour  Intake 370.99 ml  Output 1300 ml  Net -929.01 ml   Intake/Output: I/O last 3 completed shifts: In: 563 [P.O.:120; I.V.:113; IV Piggyback:330] Out: 9242 [Urine:1650]  Intake/Output this shift:  Total I/O In: 0  Out: 450 [Urine:450] Weight change: -4.3 kg Gen: lethargic but arousable CVS: tachy Resp: decreased BS at bases AST:[id number], +BS, +fluid wave Ext:1+ edema  Recent Labs  Lab 10/18/17 0419 10/19/17 0306 10/20/17 0433 10/21/17 0356 10/22/17 0349 10/23/17 0247 10/24/17 0315  NA 124* 123* 122* 123* 126* 128* 128*  K 5.2* 5.8* 6.0* 5.2* 4.6 4.7 4.7  CL 91* 89* 87* 89* 87* 84* 85*  CO2 21* 23 23 19* 24 27 26   GLUCOSE 230* 191* 185* 201* 156* 203* 237*  BUN 66* 76* 83* 85* 86* 93* 110*  CREATININE 1.81* 2.09* 2.11* 2.01* 2.09* 2.35* 2.31*  ALBUMIN 3.0* 2.9* 3.4* 3.4* 3.5 3.7 3.3*  CALCIUM 9.6 9.4 9.9 9.8 10.6* 11.7* 11.3*  PHOS  --   --   --   --   --   --  7.1*  AST 73* 62* 44* 36 36 34  --   ALT 62* 62* 43 32 30 30  --    Liver Function Tests: Recent Labs  Lab 10/21/17 0356 10/22/17 0349 10/23/17 0247 10/24/17 0315  AST 36 36 34  --   ALT 32 30 30  --   ALKPHOS 51 54 55  --   BILITOT 1.3* 1.3* 1.6*  --   PROT 5.7* 6.1* 6.2*  --   ALBUMIN 3.4* 3.5 3.7 3.3*   No results for input(s): LIPASE, AMYLASE in the last 168 hours. Recent Labs  Lab 10/23/17 0247  AMMONIA 38*   CBC: Recent Labs  Lab 10/19/17 0306 10/20/17 0433 10/21/17 0356 10/22/17 0349 10/23/17 0247  WBC 17.0* 15.4* 16.0* 15.9* 17.1*  NEUTROABS  --   --   --  12.8*  --   HGB 13.7 12.5* 12.6* 12.9* 12.8*   HCT 40.6 37.2* 37.8* 38.8* 39.2  MCV 86.9 86.7 86.9 86.4 87.5  PLT 405* 342 315 333 328   Cardiac Enzymes: No results for input(s): CKTOTAL, CKMB, CKMBINDEX, TROPONINI in the last 168 hours. CBG: Recent Labs  Lab 10/23/17 0542 10/23/17 1154 10/23/17 1706 10/23/17 2030 10/24/17 0612  GLUCAP 213* 188* 214* 221* 254*    Iron Studies: No results for input(s): IRON, TIBC, TRANSFERRIN, FERRITIN in the last 72 hours. Studies/Results: Dg Chest Port 1 View  Result Date: 10/23/2017 CLINICAL DATA:  Aspiration EXAM: PORTABLE CHEST 1 VIEW COMPARISON:  10/13/2017 FINDINGS: Hazy opacities are noted at each lung base consistent with layering effusions. Probable superimposed atelectatic lung. Superimposed pneumonia is not excluded. No overt pulmonary edema. Heart size is within normal limits. No aortic aneurysm. There is minimal aortic atherosclerosis. No acute osseous abnormality. IMPRESSION: Hazy opacities are identified at each lung base suspicious for layering pleural effusions. Probable compressive atelectasis as a result. Superimposed pneumonia would be difficult to entirely exclude. Electronically Signed   By: Shanon Brow  Randel Pigg M.D.   On: 10/23/2017 00:53   . aspirin EC  81 mg Oral Daily  . buPROPion  300 mg Oral Daily  . insulin detemir  22 Units Subcutaneous QHS  . lactulose  10 g Oral BID  . midodrine  10 mg Oral TID WC  . naloxegol oxalate  12.5 mg Oral Daily  . prasugrel  10 mg Oral Daily  . psyllium  1 packet Oral BID  . senna-docusate  1 tablet Oral BID  . zolpidem  5 mg Oral QHS    BMET    Component Value Date/Time   NA 128 (L) 10/24/2017 0315   NA 138 12/11/2016 0931   K 4.7 10/24/2017 0315   K 4.1 12/11/2016 0931   CL 85 (L) 10/24/2017 0315   CO2 26 10/24/2017 0315   CO2 28 12/11/2016 0931   GLUCOSE 237 (H) 10/24/2017 0315   GLUCOSE 128 12/11/2016 0931   BUN 110 (H) 10/24/2017 0315   BUN 13.1 12/11/2016 0931   CREATININE 2.31 (H) 10/24/2017 0315   CREATININE 0.89  09/16/2017 1118   CREATININE 0.8 12/11/2016 0931   CALCIUM 11.3 (H) 10/24/2017 0315   CALCIUM 9.6 12/11/2016 0931   GFRNONAA 28 (L) 10/24/2017 0315   GFRNONAA >60 09/16/2017 1118   GFRAA 32 (L) 10/24/2017 0315   GFRAA >60 09/16/2017 1118   CBC    Component Value Date/Time   WBC 17.1 (H) 10/23/2017 0247   RBC 4.48 10/23/2017 0247   HGB 12.8 (L) 10/23/2017 0247   HGB 13.8 09/16/2017 1118   HGB 13.4 12/11/2016 0931   HCT 39.2 10/23/2017 0247   HCT 40.9 12/11/2016 0931   PLT 328 10/23/2017 0247   PLT 132 (L) 09/16/2017 1118   PLT 116 (L) 12/11/2016 0931   MCV 87.5 10/23/2017 0247   MCV 89.5 12/11/2016 0931   MCH 28.6 10/23/2017 0247   MCHC 32.7 10/23/2017 0247   RDW 15.1 10/23/2017 0247   RDW 14.3 12/11/2016 0931   LYMPHSABS 1.5 10/22/2017 0349   LYMPHSABS 1.1 12/11/2016 0931   MONOABS 1.5 (H) 10/22/2017 0349   MONOABS 0.4 12/11/2016 0931   EOSABS 0.0 10/22/2017 0349   EOSABS 0.1 12/11/2016 0931   BASOSABS 0.0 10/22/2017 0349   BASOSABS 0.0 12/11/2016 0931     Assessment/Plan: 1. AKI- in setting of worsening ascites and s/p multiple large volume paracenteses. Low FeNa consistent with possible hepatorenal but could also be related to intravascular volume depletion following paracenteses and low albumin. Also a component of BOO cannot be ruled out given his symptoms of retention and hesitancy. Agree with holding aldactone.  1. foley cath placed9/16/19  2. octreotide and midodrinestarted 9/16/19given AKI and hypotension and possible hepatorenal syndrome.. 3. continue to follow UOP and Cr. 4. Limit volume offutureparacentesis to no more than 3 liters to limit worsening renal injury 5. Cr up somewhat but will not offer HD given his poor prognosis.  He is considering hospice 6. Responding to IVlasix to 160mg  tid. 2. STEMI- s/p PCI and DES in LAD. Stable and on dual antiplatelet therapy. 3. Ascites/cirrhosis/abdominal mass- sudden onset following cardiac  cath/cardiogenic shock. ?portal HTN vs malignant ascites. Cytology negativeand GI following.  1. Repeat US without evidence of ascites,but CT scan with moderate to large amounts. 2. Need to limit volume ofany futureparacentesis to no more than 3 liters due to AKI and intravascular volume depletion. Also will need albumin IV with paracentesis. And likely will require CT guidance given inability to find adequate window with  [place] 3. Need to discuss with GI best way to biopsy mass for diagnosisif family decides to be aggressive 4. Hyponatremia due to Ascites  5. Hyperkalemia- due to #1 and off of aldactone. improved withIV lasix and veltassa 6. Severe protein malnutrition on supplements 7. DM- per primary 8. AMS- new today would recommend ABG to r/o hypercarbia as etiology and NH3 9. Disposition- poor prognosis if this is malignant ascites or hepatorenal syndrome. Appreciatepalliative care consult to help set goals/limits of care. Pt and family considering hospice which I strongly recommend.  Donetta Potts, MD [employer] [phone]

## 2017-10-24 NOTE — Progress Notes (Signed)
Pt still lethargic/ non responsive at times. Pt will wake with discomfort but isn't able to maintain alertness. On call MD aware. Will continue to monitor.

## 2017-10-24 NOTE — Progress Notes (Signed)
PROGRESS NOTE  Daniel Rivers KGM:[id number] DOB: 1950-07-14 DOA: 10/16/2017 PCP: [name], MD  Brief Summary:  67 year old M w/ a hx of ankylosing spondylitis, anxiety, BPH, chronic back pain, CAD, GERD, dyslipidemia, obesity, nonalcoholic cirrhosis, hemochromatosis, and DM2 who was admitted by Cardiology on 10/15/2017 for a STEMI requiring a stent placement. Subsequently he developed abdominal pain and distention and was found to have ascites, GI was consulted. The patient was transferred to the [facility] service on 10/09/2017 for management of his medical issues. The patient has recurrent ascites, he is hypotensive and tachycardic. He has renal failure, nephrology following Poor prognosis, palliative care consulted  Significant Events: 11/01/2017 -Left heart catheterization with LADDESplacement  10/15/2017 - TTE -EF 35% to 40%.Severe hypokinesis of the anteroseptal and apical myocardium - grade 2 diastolic dysfunction 07/07/38 [place] -Cirrhosis of the liver - small amount of abdominal ascites - 1.4 cm hypoechoic lesion upper pole left kidney, complex cysticversus small solid nodule 10/09/2017  CTchest  No pulmonary emboli 10/08/17 TTE -mild LVH - EF 45% - akinesis of the apical septal wall, the apicalanterior wall, and the true apex 10/08/17 - Ultrasound-guided paracentesis by IR yielding 1 L of fluid.Neutrophil count close to 200, SAAG score cannot be calculated as there was no serum albumin from that day. 10/08/17 CTpotential mass lesion in the central mesentery positioned between the SMV and second portion duodenum. Consider endoscopic ultrasound (EUS) evaluation and tumor sampling. FDG PET scan may be beneficial on outpatient basis.  10/11/2017 Repeat paracentesis not done due to "no ascites on [place]" 9/11/2019CT stat ordered, study completed 10/14/2017 showing large ascites. 10/14/2017 repeat paracentesis 10/16/2017 repeat paracentesis - 5.1L  HPI/Recap of past 24 hours:  Patient started to have  tachycardia, hypotension, he has increase oxygen requirement, he is weaker,lethergic  he has audible gurgling   He does not want to take oral meds, he wish he can eat regular diet knowing the risk of aspiration He does not want to continue check labs, he does not want his blood glucose to be checked, he wish to keep tele on  Son does not want morphine drip,   Abdomen is distended,  no fever    Son at bedside  Assessment/Plan: Principal Problem:   Acute MI, anterolateral wall, initial episode of care [facility]) Active Problems:   STEMI (ST elevation myocardial infarction) ([place])   Malnutrition of moderate degree   Ascites   Abdominal distension   Abdominal distention   Shortness of breath   Cirrhosis of liver with ascites ([place])   Goals of care, counseling/discussion   Palliative care by specialist   Encounter for hospice care discussion   Ascites , 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 IR will check back with family next week - palliative GI recommended hospice -he denies abdominal pain, no fever -he has been  lasix drip, renal function worsening, sbp in the 80's despite on midodrine, lasix drip discontinued on 9/22 after discussion with patient and son --they wish to stop checking labs -they wish to liberate diet, knowing risk of aspiration    Lethargy -from over all decline?  Worsening of renal and hepatic funtion -he started to decline oral meds  AKI:  Hepatorenal? Has been on  midodrine/octreotide/ lasix drip/albumin  iv daily He is not a candidate for dialysis Lasix discontinued on 9/22 due to worsening of renal function and low bp   nephrology input  appreciated  Hyperkalemia k normalized since 9/20 with patiromer/lasix drip,  Patient now is declining oral meds, lasix drip stopped on 9/22   hyponatremia in the setting of cirrhosis/ascites and renal failure Sodium is 128today   Insulin dependent dm2 a1c 6.9 He does not wish to continue check blood glucose, he wants diet to be liberalized   CAD/STEMI (presenting symptom) -he presented to the hospital on 9/3 with chest pain, he is admitted to cardiology Dr Irven Shelling service on presentation - s/p cath and stent placement on 9/3 -he has post cath ekg changes, repeat cath on 9/5 with patent stent -on asa/parsugrel, he is refusing oral meds now -son still wish to keep patient on tele for now -cardiology last note on 9/13.  Body mass index is 29.33 kg/m.   FTT:  poor prognosis Palliative care consulted Patient is declining, possible hospital death    Code Status: DNR  Family Communication: patient , son   Disposition Plan: cotinue goals of care discussion , timing of transition to full comfort measures, son is reluctant to start on morphine drip. Possible in hospital death   Consultants:  Cardiology  Nephrology  GI palliative care  Procedures:  Cardiac cath and stent placement  Multiple paracentesis  Antibiotics:  none   Objective: BP (!) 89/67 (BP Location: Right Arm)   Pulse (!) 151   Temp 98 F (36.7 C) (Oral)   Resp (!) 30   Ht 6\' 4"  (1.93 m)   Wt 109.3 kg   SpO2 (!) 89%   BMI 29.33 kg/m   Intake/Output Summary (Last 24 hours) at 10/24/2017 1056 Last data filed at 10/24/2017 0921 Gross per 24 hour  Intake 370.99 ml  Output 1300 ml  Net -929.01 ml   Filed Weights   10/22/17 0312 10/23/17 0341 10/24/17 0251  Weight: 113.7 kg 113.6 kg 109.3 kg    Exam: Patient is examined daily including today on 10/24/2017, exams remain the same as of yesterday except that has changed    General:  Pale, frail, chronically ill, slightly confused,  lethargic   Cardiovascular: sinus tachycardia  Respiratory: diminished at basis  Abdomen: distended, nontender, no guarding, positive BS  Musculoskeletal: trace pitting Edema  Neuro: lethargic   Data Reviewed: Basic Metabolic Panel: Recent Labs  Lab 10/20/17 0433 10/21/17 0356 10/22/17 0349 10/23/17 0247 10/24/17 0315  NA 122* 123* 126* 128* 128*  K 6.0* 5.2* 4.6 4.7 4.7  CL 87* 89* 87* 84* 85*  CO2 23 19* 24 27 26   GLUCOSE 185* 201* 156* 203* 237*  BUN 83* 85* 86* 93* 110*  CREATININE 2.11* 2.01* 2.09* 2.35* 2.31*  CALCIUM 9.9 9.8 10.6* 11.7* 11.3*  MG 2.6*  --   --   --   --   PHOS  --   --   --   --  7.1*   Liver Function Tests: Recent Labs  Lab 10/19/17 0306 10/20/17 0433 10/21/17 0356 10/22/17 0349 10/23/17 0247 10/24/17 0315  AST 62* 44* 36 36 34  --   ALT 62* 43 32 30 30  --   ALKPHOS 66 58 51 54 55  --   BILITOT 1.1 1.4* 1.3* 1.3* 1.6*  --   PROT 5.6* 6.0* 5.7* 6.1* 6.2*  --   ALBUMIN 2.9* 3.4* 3.4* 3.5 3.7 3.3*   No results for input(s): LIPASE,  AMYLASE in the last 168 hours. Recent Labs  Lab 10/23/17 0247  AMMONIA 38*   CBC: Recent Labs  Lab 10/19/17 0306 10/20/17 0433 10/21/17 0356 10/22/17 0349 10/23/17 0247  WBC 17.0* 15.4* 16.0* 15.9* 17.1*  NEUTROABS  --   --   --  12.8*  --   HGB 13.7 12.5* 12.6* 12.9* 12.8*  HCT 40.6 37.2* 37.8* 38.8* 39.2  MCV 86.9 86.7 86.9 86.4 87.5  PLT 405* 342 315 333 328   Cardiac Enzymes:   No results for input(s): CKTOTAL, CKMB, CKMBINDEX, TROPONINI in the last 168 hours. BNP (last 3 results) Recent Labs    10/06/2017 0240  BNP 135.9*    ProBNP (last 3 results) No results for input(s): PROBNP in the last 8760 hours.  CBG: Recent Labs  Lab 10/23/17 0542 10/23/17 1154 10/23/17 1706 10/23/17 2030 10/24/17 0612  GLUCAP 213* 188* 214* 221* 254*    No results found for this or any previous visit (from the past 240 hour(s)).   Studies: No results found.  Scheduled Meds: . aspirin EC  81  mg Oral Daily  . buPROPion  300 mg Oral Daily  . insulin detemir  22 Units Subcutaneous QHS  . lactulose  10 g Oral BID  . midodrine  10 mg Oral TID WC  . naloxegol oxalate  12.5 mg Oral Daily  . prasugrel  10 mg Oral Daily  . psyllium  1 packet Oral BID  . senna-docusate  1 tablet Oral BID  . zolpidem  5 mg Oral QHS    Continuous Infusions: . amiodarone       Time spent: >66mins, I had a prolonged discussion regarding goals of care with patient /son at bedside, case discussed with nephrology I have personally reviewed and interpreted on  10/24/2017 daily labs, tele strips, imagings as discussed above under date review session and assessment and plans.  I reviewed all nursing notes, pharmacy notes, consultant notes,  vitals, pertinent old records  I have discussed plan of care as described above with RN , patient and family on 10/24/2017   Florencia Reasons MD, PhD  [facility] Pager [phone]. If 7PM-7AM, please contact night-coverage at [email or web address], password [facility] 10/24/2017, 10:56 AM  LOS: 19 days

## 2017-10-24 NOTE — Progress Notes (Signed)
At approximately 23:30 pt's heart rate went up to 150's sustaining. EKG obtained. Vital signs obtained. MD called new orders given for cardizem. Also pt not alert. Previous ativan given by request of patient as per order. MD aware.   Approximately 0100 pt blood pressure went below the limits for cardizem and this RN paged MD again. New orders given. Will continue to follow.

## 2017-11-02 NOTE — Discharge Summary (Signed)
Discharge/Death Summary  Daniel Rivers TKZ:[id number] DOB: 02-15-1950  PCP: [name], MD  Admit date: 10/12/17 Time of death: 10:13am on  2017-11-01  Time spent: 65mins   Discharge Diagnoses:  Active Hospital Problems   Diagnosis Date Noted  . Acute MI, anterolateral wall, initial episode of care ([place]) 10/12/2017  . Goals of care, counseling/discussion   . Palliative care by specialist   . Encounter for hospice care discussion   . Cirrhosis of liver with ascites ([place])   . Malnutrition of moderate degree 10/08/2017  . Ascites   . Abdominal distension   . Abdominal distention   . Shortness of breath   . STEMI (ST elevation myocardial infarction) ([place]) 10/12/17    Resolved Hospital Problems  No resolved problems to display.     Filed Weights   10/24/17 0251 11-01-17 0257 11-01-17 1013  Weight: 109.3 kg 109 kg 109 kg    History of present illness:  ( per admitting MD Dr Einar Gip) "Chief Complaint: Chest pain HPI: Daniel Rivers  is a 67 y.o. male  With known coronary artery disease, in 2009 he has had proximal RCA stent, again in 2006 has had angioplasty to his LAD and also his mid RCA with implantation of drug-eluting stents.  He has history of hypertension, hyperlipidemia, diabetes mellitus, chronic back pain and on chronic narcotic therapy.  He had been doing well and over the weekend had noticed mild dyspnea on exertion that is more than usual.  This morning around 9:30 AM he started having severe crushing chest discomfort with radiation to his neck and also to his arms associated with marked diaphoresis and also dyspnea.  Presented to the emergency room where he was found to have STEMI involving the anterolateral wall.  Patient is still having chest pain states that the pain is still 4 out of 10 in intensity, still has shortness of breath and diaphoresis.  No nausea or vomiting."  Hospital Course:  Principal Problem:   Acute MI, anterolateral wall, initial episode  of care [facility]) Active Problems:   STEMI (ST elevation myocardial infarction) ([place])   Malnutrition of moderate degree   Ascites   Abdominal distension   Abdominal distention   Shortness of breath   Cirrhosis of liver with ascites ([facility])   Goals of care, counseling/discussion   Palliative care by specialist   Encounter for hospice care discussion   Brief Summary:  67 year old M w/ a hx of ankylosing spondylitis, anxiety, BPH, chronic back pain, CAD, GERD, dyslipidemia, obesity, nonalcoholic cirrhosis, hemochromatosis, and DM2 who was admitted by Cardiology on Oct 12, 2017 for a STEMI requiring a stent placement. Subsequently he developed abdominal pain and distention and was found to have ascites, GI was consulted. The patient was transferred to the [facility] service on 10/09/2017 for management of his medical issues. The patient has recurrent ascites, he is hypotensive and tachycardic. He has renal failure, nephrology following Gi consulted who recommended palliative care/hospice Poor prognosis, palliative care consulted  Significant Events: Oct 12, 2017 -Left heart catheterization with LADDESplacement  10/12/2017 - TTE -EF 35% to 40%.Severe hypokinesis of the anteroseptal and apical myocardium - grade 2 diastolic dysfunction 06/09/30 [place] -Cirrhosis of the liver - small amount of abdominal ascites - 1.4 cm hypoechoic lesion upper pole left kidney, complex cysticversus small solid nodule 10/03/2017 CTchest No pulmonary emboli 10/08/17 TTE -mild LVH - EF 45% - akinesis of the apical septal wall, the apicalanterior wall, and the true apex 10/08/17 - Ultrasound-guided paracentesis by IR yielding 1 L of  fluid.Neutrophil count close to 200, SAAG score cannot be calculated as there was no serum albumin from that day. 10/08/17 CTpotential mass lesion in the central mesentery positioned between the SMV and second portion duodenum. Consider endoscopic ultrasound (EUS) evaluation and tumor sampling. FDG PET scan may  be beneficial on outpatient basis.  10/11/2017 Repeat paracentesis not done due to "no ascites on [place]" 9/11/2019CT stat ordered, study completed 10/14/2017 showing large ascites. 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:" -  GI consulted recommended hospice -he denies abdominal pain, no fever -he has been  lasix drip, renal function worsening, sbp in the 80's despite on midodrine, lasix drip discontinued on 9/22 after discussion with patient and son --they wished to stop checking labs -they wished to liberate diet, knowing risk of aspiration -he expired at 10:13am on 9/23 with family around at bedside. Offered condolences and comfort to son .    Lethargy -from over all decline?  Worsening of renal and hepatic funtion -he started to decline oral meds  AKI:  Hepatorenal? Has been on  midodrine/octreotide/ lasix drip/albumin iv daily He is not a candidate for dialysis Lasix discontinued on 9/22 due to worsening of renal function and low bp   nephrology input appreciated  Hyperkalemia k normalized since 9/20 with patiromer/lasix drip,  Patient started to decline oral meds the last several days, lasix drip stopped on 9/22   hyponatremia in the setting of cirrhosis/ascites and renal failure    Insulin dependent dm2 a1c 6.9 He does not wish to continue check blood glucose, his diet was liberalized on 9/22.  CAD/STEMI  (presenting symptom) -he presented to the hospital on 9/3 with chest pain, he is admitted to cardiology Dr Irven Shelling service on presentation - s/p cath and stent placement on 9/3 -he has post cath ekg changes, repeat cath on 9/5 with patent stent -on asa/parsugrel, he started to decline oral meds last several days  Body mass index is 29.33 kg/m.          Allergies as of 11-23-2017      Reactions   Amlodipine Besy-benazepril Hcl Cough   Brilinta [ticagrelor] Cough   Naproxen Other (See Comments)   Worsens Tinnitus      Medication List    STOP taking these medications   AMBIEN 10 MG tablet Generic drug:  zolpidem   aspirin 81 MG tablet   bisacodyl 5 MG EC tablet Commonly known as:  DULCOLAX   buPROPion 300 MG 24 hr tablet Commonly known as:  WELLBUTRIN XL   COREG 3.125 MG tablet Generic drug:  carvedilol   diclofenac 75 MG EC tablet Commonly known as:  VOLTAREN   furosemide 20 MG tablet Commonly known as:  LASIX   glimepiride 2 MG tablet Commonly known as:  AMARYL   KRILL OIL PO   losartan 100 MG tablet Commonly known as:  COZAAR   Magnesium Oxide 400 MG Caps   METAMUCIL PO   metFORMIN 1000 MG tablet Commonly known as:  GLUCOPHAGE  morphine 60 MG 12 hr tablet Commonly known as:  MS CONTIN   multivitamin capsule   NITROSTAT 0.4 MG SL tablet Generic drug:  nitroGLYCERIN   oxyCODONE 5 MG immediate release tablet Commonly known as:  Oxy IR/ROXICODONE   polyethylene glycol powder powder Commonly known as:  GLYCOLAX/MIRALAX   pravastatin 80 MG tablet Commonly known as:  PRAVACHOL   Vitamin D3 2000 units Tabs      Allergies  Allergen Reactions  . Amlodipine Besy-Benazepril Hcl Cough  . Brilinta [Ticagrelor] Cough  . Naproxen Other (See Comments)    Worsens Tinnitus      The results of significant diagnostics from this hospitalization (including imaging, microbiology, ancillary and laboratory) are listed below for reference.     Significant Diagnostic Studies: Ct Abdomen Pelvis Wo Contrast  Result Date: 10/21/2017 CLINICAL DATA:  Acute abdominal pain, decreased oral intake. History of cirrhosis. Assess mesenteric mass. Status post paracentesis of temper 01/2018, spine surgery, small bowel resection. EXAM: CT ABDOMEN AND PELVIS WITHOUT CONTRAST TECHNIQUE: Multidetector CT imaging of the abdomen and pelvis was performed following the standard protocol without IV contrast. Oral contrast administered. COMPARISON:  CT abdomen and pelvis October 14, 2017 FINDINGS: LOWER CHEST: Small pleural effusions, incompletely imaged. The visualized heart size is normal. No pericardial effusion. Severe coronary artery calcification. HEPATOBILIARY: Nodular cirrhotic liver.  Normal gallbladder. PANCREAS: Nonacute. SPLEEN: Normal. ADRENALS/URINARY TRACT: Kidneys are orthotopic, demonstrating normal size and morphology. 23 mm exophytic simple cyst upper pole RIGHT kidney. 12 mm exophytic cyst LEFT kidney. No nephrolithiasis, hydronephrosis; limited assessment for renal masses by nonenhanced CT. The unopacified ureters are normal in course and caliber. Urinary bladder is decompressed by Foley catheter. 12 mm LEFT benign adrenal adenoma. STOMACH/BOWEL: The stomach, small and large bowel are normal in course and caliber without inflammatory changes, enteric contrast has not yet reached the distal small bowel. VASCULAR/LYMPHATIC: Aortoiliac vessels are normal in course and caliber. Mild calcific atherosclerosis. No lymphadenopathy by CT size criteria. REPRODUCTIVE: Normal. 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 Similar moderate to large volume low-density ascites. Mesenteric edema. Omental nodularity. MUSCULOSKELETAL: Non-acute. Moderate to severe lower lumbar facet arthropathy. IMPRESSION: 1. Stable appearance of 3.7 x 5.7 x 7.3 cm central mesenteric mass, encasing the duodenum,  contiguous with pancreas. Differential diagnosis includes pancreatic neoplasm, GI tumor, carcinoid or lymphoma. 2. Similar moderate to large volume ascites. Similar omental nodularity concerning for carcinomatosis. 3. Cirrhosis. Aortic Atherosclerosis (ICD10-I70.0). Electronically Signed   By: Elon Alas M.D.   On: 10/21/2017 18:45   Ct Abdomen Pelvis Wo Contrast  Result Date: 10/14/2017 CLINICAL DATA:  Abdominal distention, nausea, vomiting EXAM: CT ABDOMEN AND PELVIS WITHOUT CONTRAST TECHNIQUE: Multidetector CT imaging of the abdomen and pelvis was performed following the standard protocol without IV contrast. COMPARISON:  10/11/2017 FINDINGS: Lower chest: Small bilateral pleural effusions, right greater than left, similar to prior study. Compressive atelectasis in the lower lobes. Heart is normal size. Densely calcified coronary arteries. Hepatobiliary: Nodular contours compatible with cirrhosis. No visible focal hepatic abnormality. Gallbladder grossly unremarkable. Pancreas: No focal abnormality or ductal dilatation. Spleen: No focal abnormality.  Normal size. Adrenals/Urinary Tract: Small low-density nodule in the lateral limb of the left adrenal gland, 14 mm, likely adenoma. No suspicious adrenal mass. Exophytic low-density areas off the kidneys bilaterally most compatible with cysts. No hydronephrosis. Urinary bladder grossly unremarkable. Stomach/Bowel: Stomach, large and small bowel grossly unremarkable. Vascular/Lymphatic: Aortic atherosclerosis. No enlarged abdominal or pelvic lymph  nodes. Reproductive: No visible focal abnormality. Other: Moderate to large volume ascites in the abdomen and pelvis. There is stranding/thickening noted in the omentum anteriorly best seen within the pelvis. 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 Musculoskeletal: No acute bony abnormality. 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 Differential considerations remain the same including lymphoma, GI stromal tumor, primary duodenal carcinoma, carcinoid, or atypical pancreatic cancer. There is associated moderate to large volume ascites and omental thickening concerning for peritoneal involvement. Cirrhosis. Small bilateral pleural effusions with compressive atelectasis in the lower lobes, stable. Densely calcified coronary arteries diffusely. Aortic atherosclerosis. Electronically Signed   By: Rolm Baptise M.D.   On: 10/14/2017 07:41   Dg Chest 1 View  Result Date: 10/13/2017 CLINICAL DATA:  Status post right-sided thoracentesis. EXAM: CHEST  1 VIEW COMPARISON:  Chest x-ray 10/13/2017 and chest CT 10/08/2017. FINDINGS: Reduction in right-sided pleural effusion following the thoracentesis. No postprocedural pneumothorax. There is a small left effusion and bibasilar atelectasis. IMPRESSION: Reduction in right-sided pleural effusion status post thoracentesis. No postprocedural pneumothorax. Small left effusion and bibasilar atelectasis. Electronically Signed   By: Marijo Sanes M.D.   On: 10/13/2017 13:03   Dg Chest 2 View  Result Date: 10/13/2017 CLINICAL DATA:  Short of breath EXAM: CHEST - 2 VIEW COMPARISON:  10/31/2017 FINDINGS: Hypoventilation. Decreased lung volume with bibasilar atelectasis. Small bilateral pleural effusions right greater than left. Negative for edema. Upper lobes clear IMPRESSION: Bibasilar atelectasis and bilateral effusions right greater than left. Negative for edema. Electronically Signed   By: Franchot Gallo M.D.   On: 10/13/2017 09:21   Ct Angio Chest Pe W Or Wo Contrast  Result Date: 10/08/2017 CLINICAL DATA:  Worsening shortness of breath today. EXAM: CT ANGIOGRAPHY CHEST WITH CONTRAST TECHNIQUE: Multidetector CT imaging of the chest was performed using the standard protocol during bolus administration of intravenous contrast.  Multiplanar CT image reconstructions and MIPs were obtained to evaluate the vascular anatomy. CONTRAST:  12mL ISOVUE-370 IOPAMIDOL (ISOVUE-370) INJECTION 76% COMPARISON:  Portable chest obtained earlier today. Abdomen ultrasound dated 10/21/2017. FINDINGS: Cardiovascular: Normally opacified pulmonary arteries with no pulmonary arterial filling defects seen. Atheromatous calcifications, including the coronary arteries and aorta. Mediastinum/Nodes: No enlarged mediastinal, hilar, or axillary lymph nodes. Thyroid gland, trachea, and esophagus demonstrate no significant findings. Lungs/Pleura: Mild bullous changes. Bilateral lower lobe atelectasis. Moderate-sized right pleural effusion and small left pleural effusion. Upper Abdomen: Moderate to large amount of free peritoneal fluid. The included portion of the liver has mildly lobulated contours with an enlarged lateral segment left lobe. Musculoskeletal: Thoracic and lower cervical spine degenerative changes with diffuse ankylosis. Review of the MIP images confirms the above findings. IMPRESSION: 1. No pulmonary emboli. 2. Moderate-sized right pleural effusion and small left pleural effusion. 3. Bilateral lower lobe atelectasis. 4. Moderate to large amount of free peritoneal fluid. 5. Changes of cirrhosis of the liver. 6. Mild changes of COPD. 7. Atheromatous calcifications, including the coronary arteries and aorta. Aortic Atherosclerosis (ICD10-I70.0) and Emphysema (ICD10-J43.9). Electronically Signed   By: Claudie Revering M.D.   On: 10/08/2017 18:40   Ct Abdomen W Contrast  Result Date: 10/27/2017 CLINICAL DATA:  Abdominal distension. Short of breath. Recent heart catheterization. History of small bowel resection 1960 EXAM: CT ABDOMEN WITH CONTRAST TECHNIQUE: Multidetector CT imaging of the abdomen was performed using the standard protocol following bolus administration of  intravenous contrast. CONTRAST:  166mL ISOVUE-300 IOPAMIDOL (ISOVUE-300) INJECTION 61%, <See  Chart> ISOVUE-300 IOPAMIDOL (ISOVUE-300) INJECTION 61% COMPARISON:  None. FINDINGS: Lower chest: Bilateral pleural effusions which are small to moderate and greater on the RIGHT. There is associated bibasilar atelectasis Hepatobiliary: Liver has a fine nodular contour. Caudate lobe is prominent. Portal veins patent. Gallbladder normal. Pancreas: Normal pancreatic parenchymal intensity. No ductal dilatation or inflammation. Spleen: Normal spleen. Adrenals/urinary tract: Small LEFT adrenal adenoma. No renal obstruction. Low-density lesions in the renal cortex likely represent benign cysts Stomach/Bowel: NG tube extends into the gastric antrum. Stomach, duodenum and limited view of the bowel and colon demonstrate no acute findings. Is moderate volume fluid in the peritoneal space along the pericolic gutters in surrounding the liver and spleen. Vascular/Lymphatic: Abdominal aortic normal caliber with intimal calcifications. There is central mesenteric mass positioned between the superior mesenteric vein and the third portion duodenum measuring 5.1 x 3.2 cm (image 53/3). Lesion also seen on coronal image 86/6. Lesion abuts the uncinate of the pancreas. Musculoskeletal: No aggressive osseous lesion IMPRESSION: 1. Potential mass lesion in the central mesentery positioned between the SMV and second portion duodenum. Mass suggests lymphoma, gastrointestinal stromal tumor, primary duodenal carcinoma, carcinoid tumor, atypical pancreatic carcinoma or metastatic nodal conglomerate from unknown primary. Lower abdomen and pelvis not included on exam. Consider endoscopic ultrasound (EUS) evaluation and tumor sampling. FDG PET scan may be beneficial on outpatient basis. 2. Nodule liver with ascites suggests cirrhosis. 3. Normal bilateral pleural effusions and associated atelectasis. Electronically Signed   By: Suzy Bouchard M.D.   On: 10/06/2017 13:02   US Abdomen Complete  Result Date: 10/23/2017 CLINICAL DATA:  Abdomen  distension EXAM: ABDOMEN ULTRASOUND COMPLETE COMPARISON:  Ultrasound 09/12/2015, 09/06/2013 FINDINGS: Gallbladder: No gallstones or wall thickening visualized. No sonographic Murphy sign noted by sonographer. Common bile duct: Diameter: 4.5 mm Liver: Nodular contour with heterogeneous echotexture consistent with cirrhosis. Diffuse increased echogenicity with probable fatty sparing near the gallbladder fossa. Portal vein is patent on color Doppler imaging with normal direction of blood flow towards the liver. IVC: No abnormality visualized. Pancreas: Poorly visualized due to gas. Spleen: Size and appearance within normal limits. Right Kidney: Length: 12.4 cm. Cortical echogenicity within normal limits. No hydronephrosis. Small cyst at the mid to lower pole measuring 1.7 cm. Left Kidney: Length: 11.7 cm. Cortical echogenicity within normal limits. 1.4 cm hypoechoic lesion upper pole left kidney, indeterminate. Abdominal aorta: No aneurysm visualized.  Maximum diameter 2.6 cm. Other findings: Small amount of ascites. IMPRESSION: 1. Cirrhosis of the liver.  Small amount of abdominal ascites. 2. Negative for gallstones or biliary dilatation 3. 1.4 cm hypoechoic lesion upper pole left kidney, complex cystic versus small solid nodule. Could further evaluate with dedicated renal CT or MRI when clinically feasible. Electronically Signed   By: Donavan Foil M.D.   On: 10/12/2017 23:51   US Paracentesis  Result Date: 10/16/2017 INDICATION: Patient with history of NASH cirrhosis and recurrent ascites. Request is made for diagnostic and therapeutic paracentesis. EXAM: ULTRASOUND GUIDED DIAGNOSTIC AND THERAPEUTIC PARACENTESIS MEDICATIONS: 10 mL of 2% lidocaine COMPLICATIONS: None immediate. PROCEDURE: Informed written consent was obtained from the patient after a discussion of the risks, benefits and alternatives to treatment. A timeout was performed prior to the initiation of the procedure. Initial ultrasound scanning  demonstrates a moderate amount of ascites within the right lower abdominal quadrant. The right lower abdomen was prepped and draped in the usual sterile fashion. 2% lidocaine was used for local anesthesia. Following this,  a 6 Fr Safe-T-Centesis catheter was introduced. An ultrasound image was saved for documentation purposes. The paracentesis was performed. The catheter was removed and a dressing was applied. The patient tolerated the procedure well without immediate post procedural complication. FINDINGS: A total of approximately 5.1 L of blood-tinged fluid was removed. Samples were sent to the laboratory as requested by the clinical team. IMPRESSION: Successful ultrasound-guided paracentesis yielding 5.1 L of peritoneal fluid. Read by: Earley Abide, PA-C Electronically Signed   By: Sandi Mariscal M.D.   On: 10/16/2017 12:06   Dg Chest Port 1 View  Result Date: 10/23/2017 CLINICAL DATA:  Aspiration EXAM: PORTABLE CHEST 1 VIEW COMPARISON:  10/13/2017 FINDINGS: Hazy opacities are noted at each lung base consistent with layering effusions. Probable superimposed atelectatic lung. Superimposed pneumonia is not excluded. No overt pulmonary edema. Heart size is within normal limits. No aortic aneurysm. There is minimal aortic atherosclerosis. No acute osseous abnormality. IMPRESSION: Hazy opacities are identified at each lung base suspicious for layering pleural effusions. Probable compressive atelectasis as a result. Superimposed pneumonia would be difficult to entirely exclude. Electronically Signed   By: Ashley Royalty M.D.   On: 10/23/2017 00:53   Dg Chest Port 1 View  Result Date: 10/12/2017 CLINICAL DATA:  Sob today,hx NG present ,abd distention too EXAM: PORTABLE CHEST 1 VIEW COMPARISON:  Chest x-ray dated 10/16/2017. FINDINGS: Study is hypoinspiratory with crowding of the perihilar and bibasilar bronchovascular markings, similar to previous exam. No new lung findings. No pleural effusion or pneumothorax seen.  Heart size and mediastinal contours appear stable. NG tube passes below the diaphragm. IMPRESSION: Low lung volumes, similar to previous study. No new findings. No evidence of pneumonia or pulmonary edema. Electronically Signed   By: Franki Cabot M.D.   On: 10/14/2017 10:45   Dg Chest Portable 1 View  Result Date: 10/31/2017 CLINICAL DATA:  Shortness of breath.  Chest pain. EXAM: PORTABLE CHEST 1 VIEW COMPARISON:  Chest x-ray report 04/12/2013. FINDINGS: Two images obtained. The second image with deeper inspiration. Right costophrenic angle not imaged. Heart size normal. Low lung volumes with mild bibasilar atelectasis. No pleural effusion or pneumothorax. Degenerative changes scoliosis thoracic spine. IMPRESSION: Low lung volumes with mild bibasilar atelectasis. Electronically Signed   By: Marcello Moores  Register   On: 10/19/2017 12:14   Dg Abd Portable 1v  Result Date: 10/12/2017 CLINICAL DATA:  Abdominal distension. EXAM: PORTABLE ABDOMEN - 1 VIEW COMPARISON:  CT abdomen 10/17/2017. Plain film of the abdomen 10/06/2017. FINDINGS: The bowel gas pattern is normal. No radio-opaque calculi or other significant radiographic abnormality are seen. IMPRESSION: Negative exam. Electronically Signed   By: Inge Rise M.D.   On: 10/12/2017 13:01   Dg Abd Portable 1v  Result Date: 10/23/2017 CLINICAL DATA:  NG tube placement EXAM: PORTABLE ABDOMEN - 1 VIEW COMPARISON:  10/18/2017 FINDINGS: Insertion of esophageal tube, tip and side port overlie the gastric body. Decreased gastric distention. Otherwise nonobstructed gas pattern. Contrast within the bladder. IMPRESSION: Esophageal tube tip overlies the gastric body. There is decreased gastric distention. Electronically Signed   By: Donavan Foil M.D.   On: 10/17/2017 20:21   Dg Abd Portable 1v  Result Date: 10/08/2017 CLINICAL DATA:  Abdomen distension EXAM: PORTABLE ABDOMEN - 1 VIEW COMPARISON:  Chest x-ray 10/14/2017 FINDINGS: Central gas collection presumably  represents air-filled dilated stomach. Remaining gas pattern nonobstructed. Radiopaque contrast within the bladder. IMPRESSION: Overall nonobstructed gas pattern. Prominent central gas collection presumably reflects moderate gaseous enlargement of the stomach Electronically Signed  By: Donavan Foil M.D.   On: 10/08/2017 19:30   Ir Abdomen US Limited  Result Date: 10/20/2017 CLINICAL DATA:  Cirrhosis, recurrent abdominal ascites EXAM: LIMITED ABDOMEN ULTRASOUND FOR ASCITES TECHNIQUE: Limited ultrasound survey for ascites was performed in all four abdominal quadrants. COMPARISON:  10/16/2017 and previous FINDINGS: Small amount of scattered abdominal ascites. No large pocket for safe therapeutic paracentesis. IMPRESSION: Minimal ascites.  Paracentesis deferred. Electronically Signed   By: Lucrezia Europe M.D.   On: 10/20/2017 13:32   Ir Abdomen US Limited  Result Date: 10/11/2017 CLINICAL DATA:  Cirrhosis. Mesenteric mass. Ascites. Paracentesis requested EXAM: LIMITED ABDOMEN ULTRASOUND FOR ASCITES TECHNIQUE: Limited ultrasound survey for ascites was performed in all four abdominal quadrants. COMPARISON:  CT 10/24/2017 FINDINGS: Small amount of abdominal ascites. No pocket large enough for safe paracentesis due to regional bowel. IMPRESSION: Low volume abdominal ascites.  Paracentesis deferred. Electronically Signed   By: Lucrezia Europe M.D.   On: 10/11/2017 16:16   Ir Paracentesis  Result Date: 10/14/2017 INDICATION: 67 year old male with a history of recurrent ascites EXAM: ULTRASOUND GUIDED  PARACENTESIS MEDICATIONS: None. COMPLICATIONS: None PROCEDURE: Informed written consent was obtained from the patient after a discussion of the risks, benefits and alternatives to treatment. A timeout was performed prior to the initiation of the procedure. Initial ultrasound scanning demonstrates a small amount of ascites within the right lower abdominal quadrant. The right lower abdomen was prepped and draped in the usual  sterile fashion. 1% lidocaine with epinephrine was used for local anesthesia. Following this, a 8 Fr Safe-T-Centesis catheter was introduced. An ultrasound image was saved for documentation purposes. The paracentesis was performed. The catheter was removed and a dressing was applied. The patient tolerated the procedure well without immediate post procedural complication. FINDINGS: A total of approximately 6,400 of blood tinged fluid was removed. IMPRESSION: Status post ultrasound-guided paracentesis. Electronically Signed   By: Corrie Mckusick D.O.   On: 10/14/2017 12:57   Ir Thoracentesis Asp Pleural Space W/img Guide  Result Date: 10/13/2017 INDICATION: Shortness of breath. Right-sided pleural effusion on imaging. Request diagnostic and therapeutic thoracentesis. EXAM: ULTRASOUND GUIDED RIGHT THORACENTESIS MEDICATIONS: None. COMPLICATIONS: None immediate. Postprocedural chest x-ray negative for pneumothorax. PROCEDURE: An ultrasound guided thoracentesis was thoroughly discussed with the patient and questions answered. The benefits, risks, alternatives and complications were also discussed. The patient understands and wishes to proceed with the procedure. Written consent was obtained. Ultrasound was performed to localize and mark an small but adequate pocket of fluid in the right chest. The area was then prepped and draped in the normal sterile fashion. 1% Lidocaine was used for local anesthesia. Under ultrasound guidance a 6 Fr Safe-T-Centesis catheter was introduced. Thoracentesis was performed. The catheter was removed and a dressing applied. FINDINGS: A total of approximately 300 mL of hazy, amber colored fluid was removed. Samples were sent to the laboratory as requested by the clinical team. IMPRESSION: Successful ultrasound guided right thoracentesis yielding 300 mL of pleural fluid. Read by: Ascencion Dike PA-C Electronically Signed   By: Lucrezia Europe M.D.   On: 10/13/2017 13:26    Microbiology: No  results found for this or any previous visit (from the past 240 hour(s)).   Labs: Basic Metabolic Panel: Recent Labs  Lab 10/20/17 0433 10/21/17 0356 10/22/17 0349 10/23/17 0247 10/24/17 0315  NA 122* 123* 126* 128* 128*  K 6.0* 5.2* 4.6 4.7 4.7  CL 87* 89* 87* 84* 85*  CO2 23 19* 24 27 26   GLUCOSE 185* 201* 156*  203* 237*  BUN 83* 85* 86* 93* 110*  CREATININE 2.11* 2.01* 2.09* 2.35* 2.31*  CALCIUM 9.9 9.8 10.6* 11.7* 11.3*  MG 2.6*  --   --   --   --   PHOS  --   --   --   --  7.1*   Liver Function Tests: Recent Labs  Lab 10/19/17 0306 10/20/17 0433 10/21/17 0356 10/22/17 0349 10/23/17 0247 10/24/17 0315  AST 62* 44* 36 36 34  --   ALT 62* 43 32 30 30  --   ALKPHOS 66 58 51 54 55  --   BILITOT 1.1 1.4* 1.3* 1.3* 1.6*  --   PROT 5.6* 6.0* 5.7* 6.1* 6.2*  --   ALBUMIN 2.9* 3.4* 3.4* 3.5 3.7 3.3*   No results for input(s): LIPASE, AMYLASE in the last 168 hours. Recent Labs  Lab 10/23/17 0247  AMMONIA 38*   CBC: Recent Labs  Lab 10/19/17 0306 10/20/17 0433 10/21/17 0356 10/22/17 0349 10/23/17 0247  WBC 17.0* 15.4* 16.0* 15.9* 17.1*  NEUTROABS  --   --   --  12.8*  --   HGB 13.7 12.5* 12.6* 12.9* 12.8*  HCT 40.6 37.2* 37.8* 38.8* 39.2  MCV 86.9 86.7 86.9 86.4 87.5  PLT 405* 342 315 333 328   Cardiac Enzymes: No results for input(s): CKTOTAL, CKMB, CKMBINDEX, TROPONINI in the last 168 hours. BNP: BNP (last 3 results) Recent Labs    10/18/2017 0240  BNP 135.9*    ProBNP (last 3 results) No results for input(s): PROBNP in the last 8760 hours.  CBG: Recent Labs  Lab 10/23/17 0542 10/23/17 1154 10/23/17 1706 10/23/17 2030 10/24/17 0612  GLUCAP 213* 188* 214* 221* 254*       Signed:  Florencia Reasons MD, PhD  [facility] 11-20-2017, 9:27 PM

## 2017-11-02 NOTE — Progress Notes (Signed)
Nurse called to room at this time. Patient had no HR or Respirations. Patient expired at 10:13 am. Death pronounced by RN. Two nurses at bedside. Emelda Fear, RN and Nicanor Bake, RN. Dr. Erlinda Hong notified.

## 2017-11-02 NOTE — Progress Notes (Signed)
[facility] ROUNDING NOTE   Subjective:   Son at bedside and patient not responding  Staring blankly   Informed that patient now on comfort care    Offered condolences and comfort to son    Objective:  Vital signs in last 24 hours:  Temp:  [98.4 F (36.9 C)-98.5 F (36.9 C)] 98.4 F (36.9 C) (09/23 0847) Pulse Rate:  [70-156] 70 (09/23 0847) BP: (60-75)/(39-56) 60/39 (09/23 0847) SpO2:  [86 %-87 %] 86 % (09/23 0847) Weight:  [109 kg] 109 kg (09/23 0257)  Weight change: -0.3 kg Filed Weights   10/23/17 0341 10/24/17 0251 Nov 12, 2017 0257  Weight: 113.6 kg 109.3 kg 109 kg    Intake/Output: I/O last 3 completed shifts: In: 371 [P.O.:60; I.V.:113; IV Piggyback:198] Out: 900 [Urine:900]   Intake/Output this shift:  No intake/output data recorded.  CVS- RRR RS- CTA ABD- BS present soft non-distended EXT- no edema   Basic Metabolic Panel: Recent Labs  Lab 10/20/17 0433 10/21/17 0356 10/22/17 0349 10/23/17 0247 10/24/17 0315  NA 122* 123* 126* 128* 128*  K 6.0* 5.2* 4.6 4.7 4.7  CL 87* 89* 87* 84* 85*  CO2 23 19* 24 27 26   GLUCOSE 185* 201* 156* 203* 237*  BUN 83* 85* 86* 93* 110*  CREATININE 2.11* 2.01* 2.09* 2.35* 2.31*  CALCIUM 9.9 9.8 10.6* 11.7* 11.3*  MG 2.6*  --   --   --   --   PHOS  --   --   --   --  7.1*    Liver Function Tests: Recent Labs  Lab 10/19/17 0306 10/20/17 0433 10/21/17 0356 10/22/17 0349 10/23/17 0247 10/24/17 0315  AST 62* 44* 36 36 34  --   ALT 62* 43 32 30 30  --   ALKPHOS 66 58 51 54 55  --   BILITOT 1.1 1.4* 1.3* 1.3* 1.6*  --   PROT 5.6* 6.0* 5.7* 6.1* 6.2*  --   ALBUMIN 2.9* 3.4* 3.4* 3.5 3.7 3.3*   No results for input(s): LIPASE, AMYLASE in the last 168 hours. Recent Labs  Lab 10/23/17 0247  AMMONIA 38*    CBC: Recent Labs  Lab 10/19/17 0306 10/20/17 0433 10/21/17 0356 10/22/17 0349 10/23/17 0247  WBC 17.0* 15.4* 16.0* 15.9* 17.1*  NEUTROABS  --   --   --  12.8*  --   HGB 13.7 12.5* 12.6*  12.9* 12.8*  HCT 40.6 37.2* 37.8* 38.8* 39.2  MCV 86.9 86.7 86.9 86.4 87.5  PLT 405* 342 315 333 328    Cardiac Enzymes: No results for input(s): CKTOTAL, CKMB, CKMBINDEX, TROPONINI in the last 168 hours.  BNP: Invalid input(s): POCBNP  CBG: Recent Labs  Lab 10/23/17 0542 10/23/17 1154 10/23/17 1706 10/23/17 2030 10/24/17 0612  GLUCAP 213* 188* 214* 42* 254*    Microbiology: Results for orders placed or performed during the hospital encounter of 10/13/2017  MRSA PCR Screening     Status: Abnormal   Collection Time: 10/06/2017  3:45 PM  Result Value Ref Range Status   MRSA by PCR POSITIVE (A) NEGATIVE Final    Comment:        The GeneXpert MRSA Assay (FDA approved for NASAL specimens only), is one component of a comprehensive MRSA colonization surveillance program. It is not intended to diagnose MRSA infection nor to guide or monitor treatment for MRSA infections. RESULT CALLED TO, READ BACK BY AND VERIFIED WITH: Angelica Chessman RN 17:20 10/27/2017 ([email or web address]) Performed at [facility], [place]  [address], [place], [place] [address]   Stat Gram stain     Status: None   Collection Time: 10/08/17  7:31 PM  Result Value Ref Range Status   Specimen Description PERITONEAL  Final   Special Requests NONE  Final   Gram Stain   Final    FEW WBC PRESENT,BOTH PMN AND MONONUCLEAR NO ORGANISMS SEEN Performed at [facility], [address] [place], [place] [address]    Report Status 10/08/2017 FINAL  Final  Culture, body fluid-bottle     Status: None   Collection Time: 10/08/17  7:31 PM  Result Value Ref Range Status   Specimen Description PERITONEAL  Final   Special Requests NONE  Final   Culture   Final    NO GROWTH 5 DAYS Performed at [place] [address], [place], [place] [address]    Report Status 10/13/2017 FINAL  Final  Culture, Urine     Status: None   Collection Time: 10/09/17  7:47 PM  Result Value Ref Range Status   Specimen Description URINE,  RANDOM  Final   Special Requests NONE  Final   Culture   Final    NO GROWTH Performed at [facility], [place] [address], [place], [place] [address]    Report Status 10/10/2017 FINAL  Final  Gram stain     Status: None   Collection Time: 10/13/17 12:44 PM  Result Value Ref Range Status   Specimen Description PLEURAL RIGHT  Final   Special Requests NONE  Final   Gram Stain   Final    RARE WBC PRESENT, PREDOMINANTLY PMN NO ORGANISMS SEEN Performed at [facility], [place] [address], [place], [place] [address]    Report Status 10/13/2017 FINAL  Final  Culture, body fluid-bottle     Status: None   Collection Time: 10/13/17 12:44 PM  Result Value Ref Range Status   Specimen Description PLEURAL RIGHT  Final   Special Requests NONE  Final   Culture   Final    NO GROWTH 5 DAYS Performed at [facility] [address] [place], [place] [address]    Report Status 10/18/2017 FINAL  Final    Coagulation Studies: No results for input(s): LABPROT, INR in the last 72 hours.  Urinalysis: No results for input(s): COLORURINE, LABSPEC, PHURINE, GLUCOSEU, HGBUR, BILIRUBINUR, KETONESUR, PROTEINUR, UROBILINOGEN, NITRITE, LEUKOCYTESUR in the last 72 hours.  Invalid input(s): APPERANCEUR    Imaging: No results found.   Medications:    . aspirin EC  81 mg Oral Daily  . buPROPion  300 mg Oral Daily  . lactulose  10 g Oral BID  . midodrine  10 mg Oral TID WC  . naloxegol oxalate  12.5 mg Oral Daily  . prasugrel  10 mg Oral Daily  . psyllium  1 packet Oral BID  . senna-docusate  1 tablet Oral BID  . zolpidem  5 mg Oral QHS   acetaminophen, diazepam, guaiFENesin-dextromethorphan, ipratropium-albuterol, lidocaine (PF), lidocaine, morphine, morphine injection, nitroGLYCERIN, ondansetron (ZOFRAN) IV, RESOURCE THICKENUP CLEAR  Assessment/ Plan:   Acute kidney injury in setting of worsening ascites and multiple large volume paracentesis. 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 Some omental nodularity  Will sign off  Please call me if you need additional support or help    LOS: 20 [address] Rivers @TODAY @9 :37 AM

## 2017-11-02 NOTE — Progress Notes (Addendum)
Post mortem checklist complete. [facility] notified.

## 2017-11-02 DEATH — deceased

## 2017-12-16 ENCOUNTER — Other Ambulatory Visit: Payer: Medicare Other

## 2017-12-23 ENCOUNTER — Ambulatory Visit: Payer: Medicare Other | Admitting: Oncology

## 2019-02-27 IMAGING — US IR ABDOMEN US LIMITED
1 series · 4 of 4 positions shown · non-contrast
Comparison: CT 10/07/2017

CLINICAL DATA: Cirrhosis. Mesenteric mass. Ascites. Paracentesis
requested

EXAM:
LIMITED ABDOMEN ULTRASOUND FOR ASCITES
TECHNIQUE: Limited ultrasound survey for ascites was performed in all four
abdominal quadrants.

[Series 1: ir (id) (id)/(id)/(id) ir · 4 of 4 slices shown]
[im 1/4]
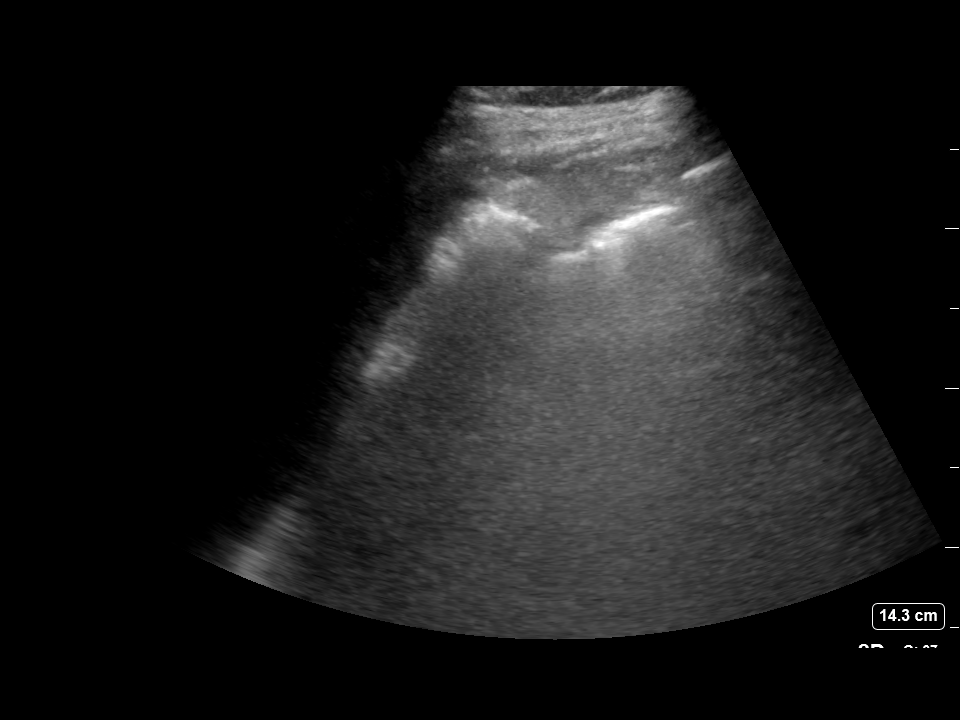
[im 2/4]
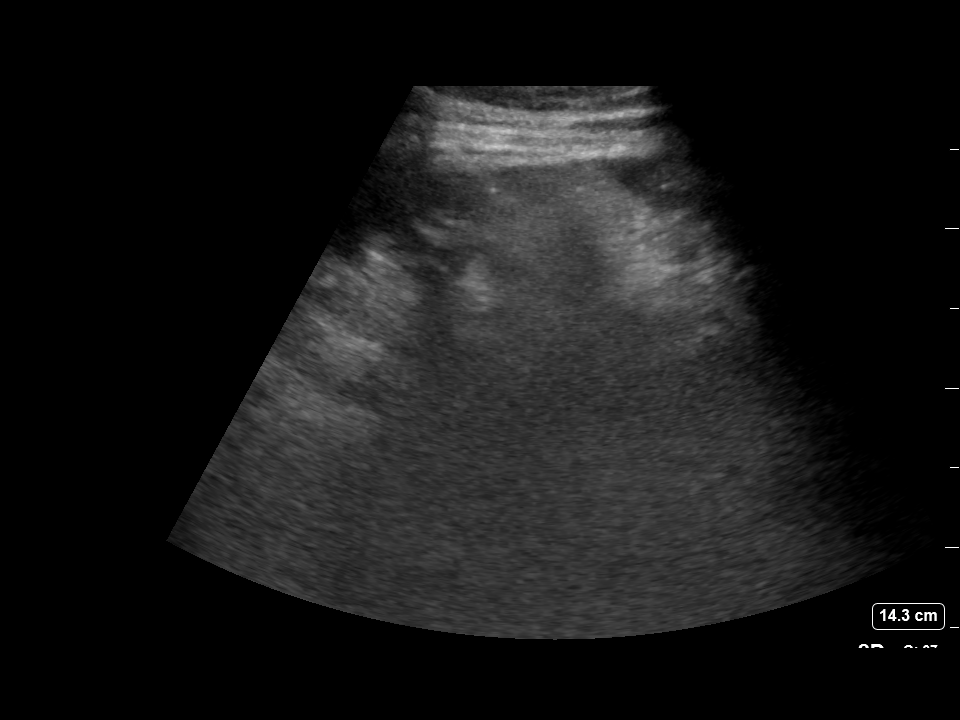
[im 3/4]
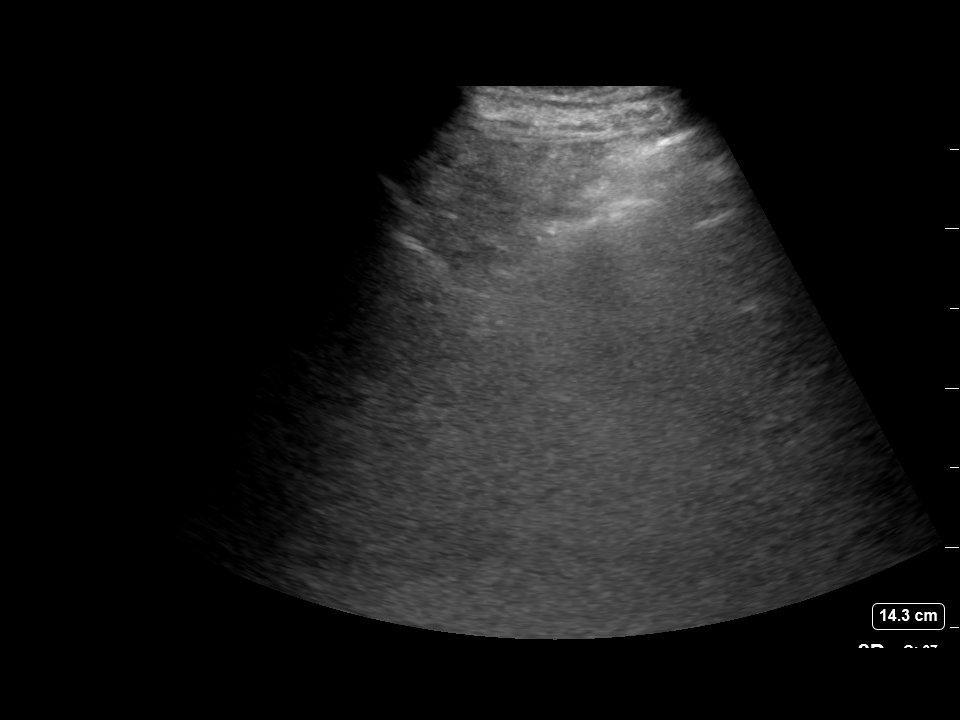
[im 4/4]
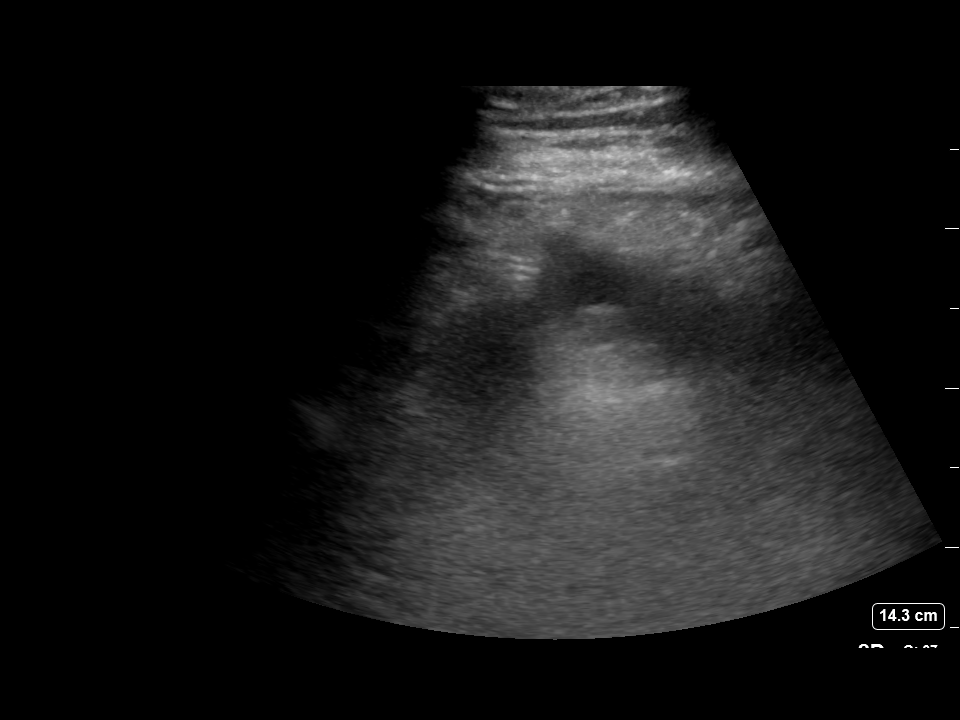

[4 of 4 positions shown; findings below may reference images not displayed]

FINDINGS: Small amount of abdominal ascites. No pocket large enough for safe
paracentesis due to regional bowel.
IMPRESSION: Low volume abdominal ascites.  Paracentesis deferred.

## 2019-03-01 IMAGING — US IR THORACENTESIS ASP PLEURAL SPACE W/IMG GUIDE
1 series · 6 of 6 positions shown · non-contrast
Comparison: none

INDICATION: Shortness of breath. Right-sided pleural effusion on imaging.
Request diagnostic and therapeutic thoracentesis.

[Series 1: ir (id) (id)/(id)/(id) ir · 6 of 6 slices shown]
[im 1/6]
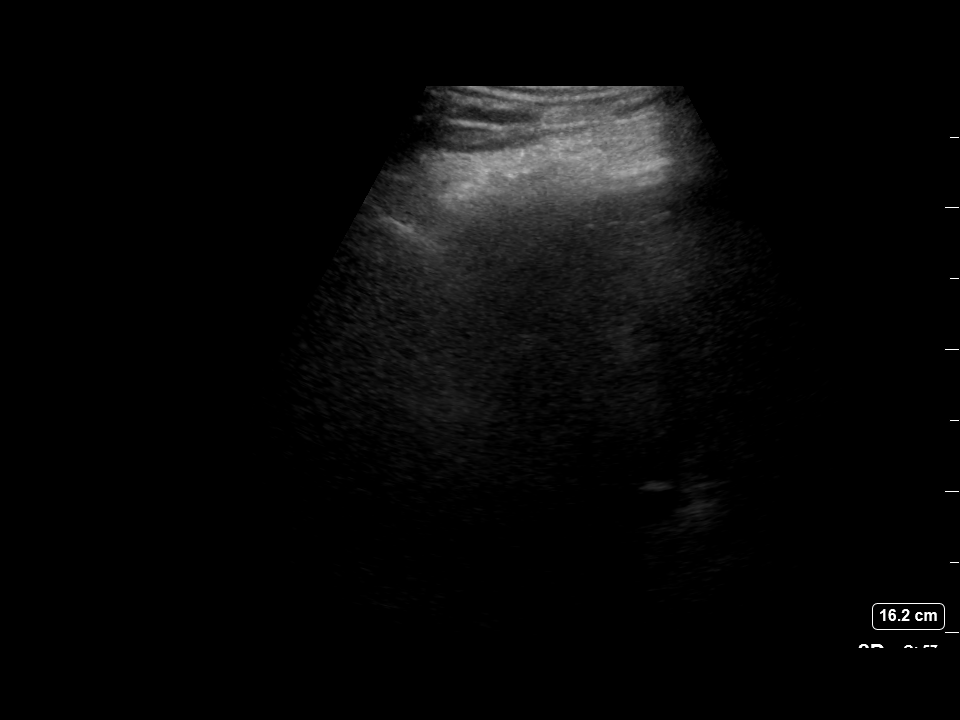
[im 2/6]
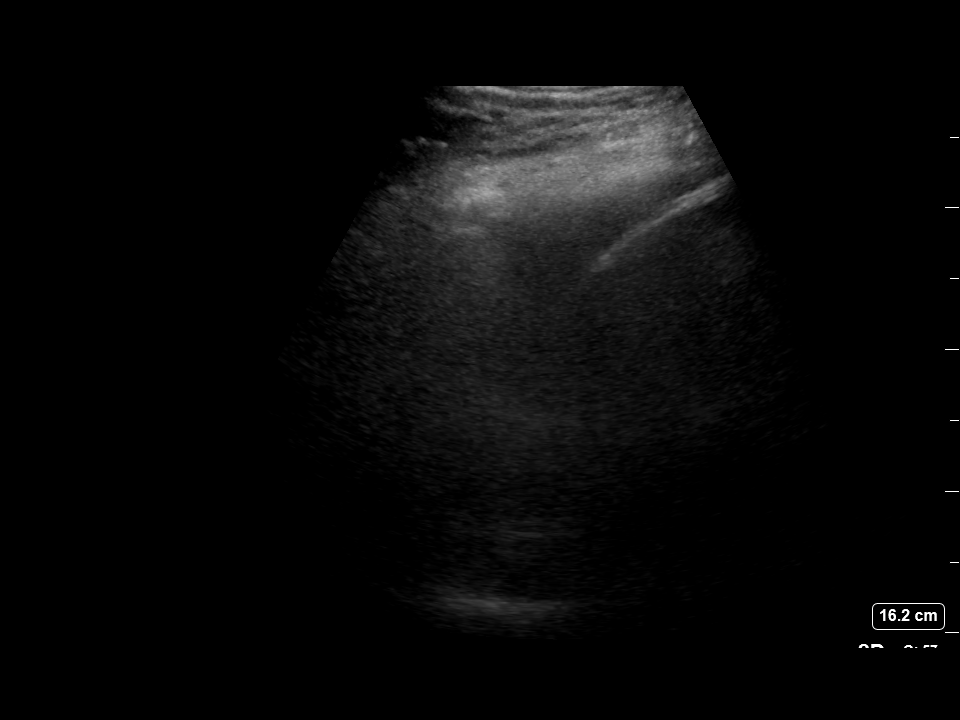
[im 3/6]
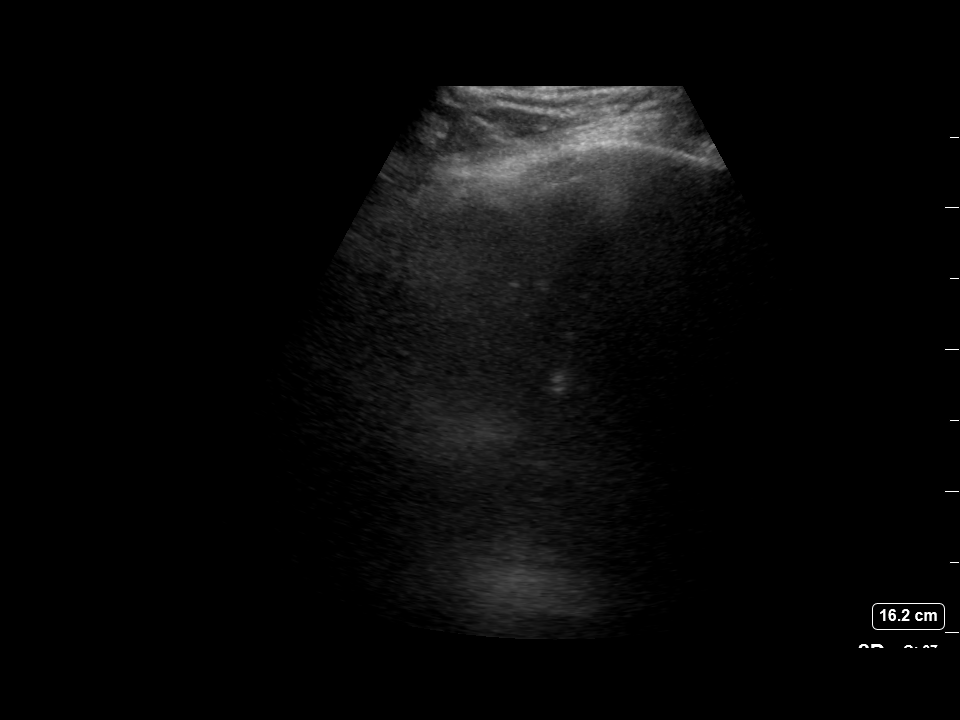
[im 4/6]
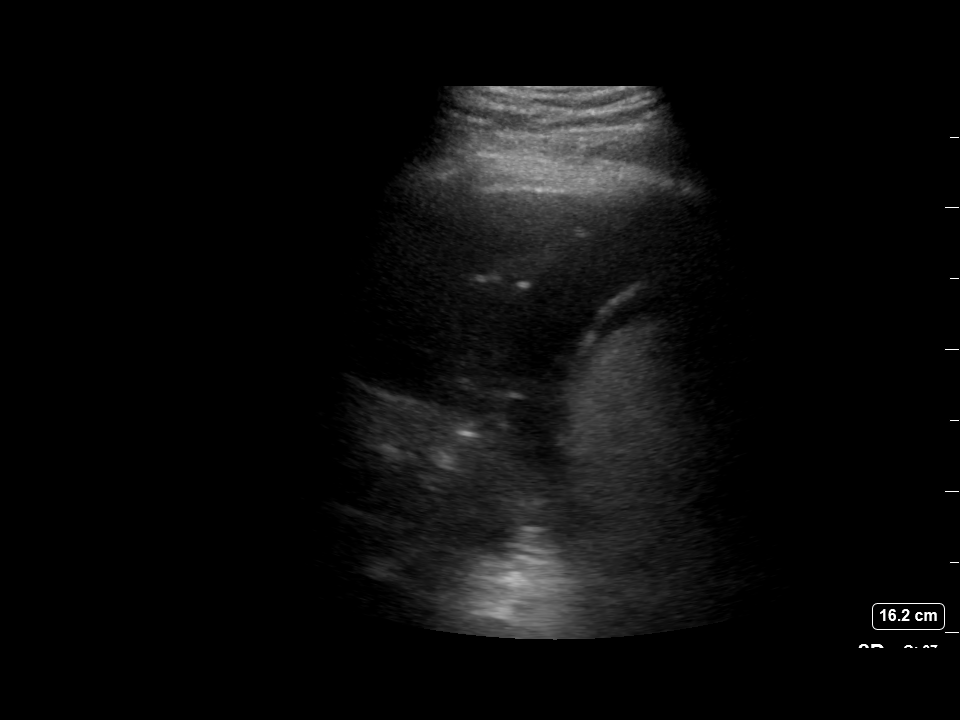
[im 5/6]
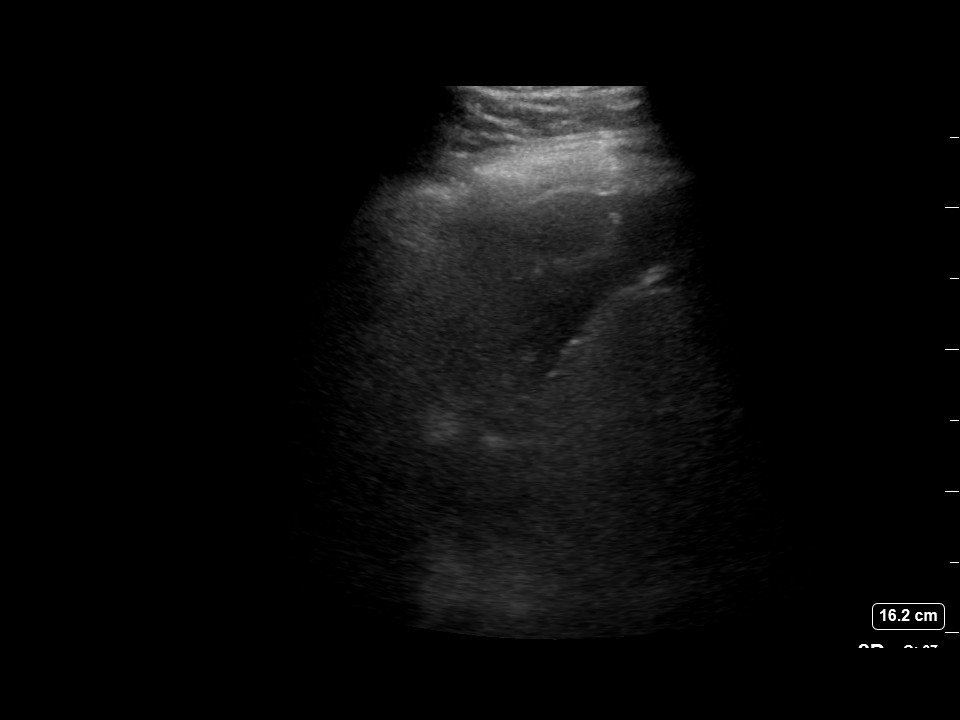
[im 6/6]
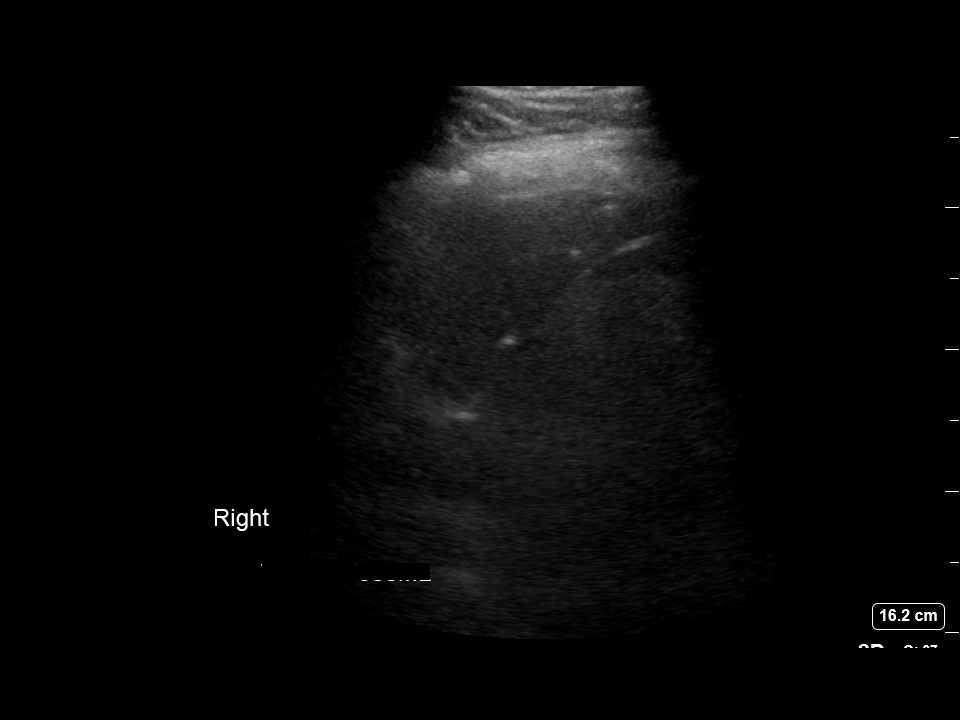

[6 of 6 positions shown; findings below may reference images not displayed]

EXAM:
ULTRASOUND GUIDED RIGHT THORACENTESIS

MEDICATIONS:
None.

COMPLICATIONS:
None immediate. Postprocedural chest x-ray negative for
pneumothorax.

PROCEDURE:
An ultrasound guided thoracentesis was thoroughly discussed with the
patient and questions answered. The benefits, risks, alternatives
and complications were also discussed. The patient understands and
wishes to proceed with the procedure. Written consent was obtained.

Ultrasound was performed to localize and mark an small but adequate
pocket of fluid in the right chest. The area was then prepped and
draped in the normal sterile fashion. 1% Lidocaine was used for
local anesthesia. Under ultrasound guidance a 6 Fr Safe-T-Centesis
catheter was introduced. Thoracentesis was performed. The catheter
was removed and a dressing applied.
FINDINGS: A total of approximately 300 mL of hazy, amber colored fluid was
removed. Samples were sent to the laboratory as requested by the
clinical team.
IMPRESSION: Successful ultrasound guided right thoracentesis yielding 300 mL of
pleural fluid.

## 2019-03-01 IMAGING — DX DG CHEST 2V
2 series · 2 of 2 positions shown · non-contrast
Comparison: 10/07/2017

CLINICAL DATA: Short of breath

EXAM:
CHEST - 2 VIEW

[chest lat]
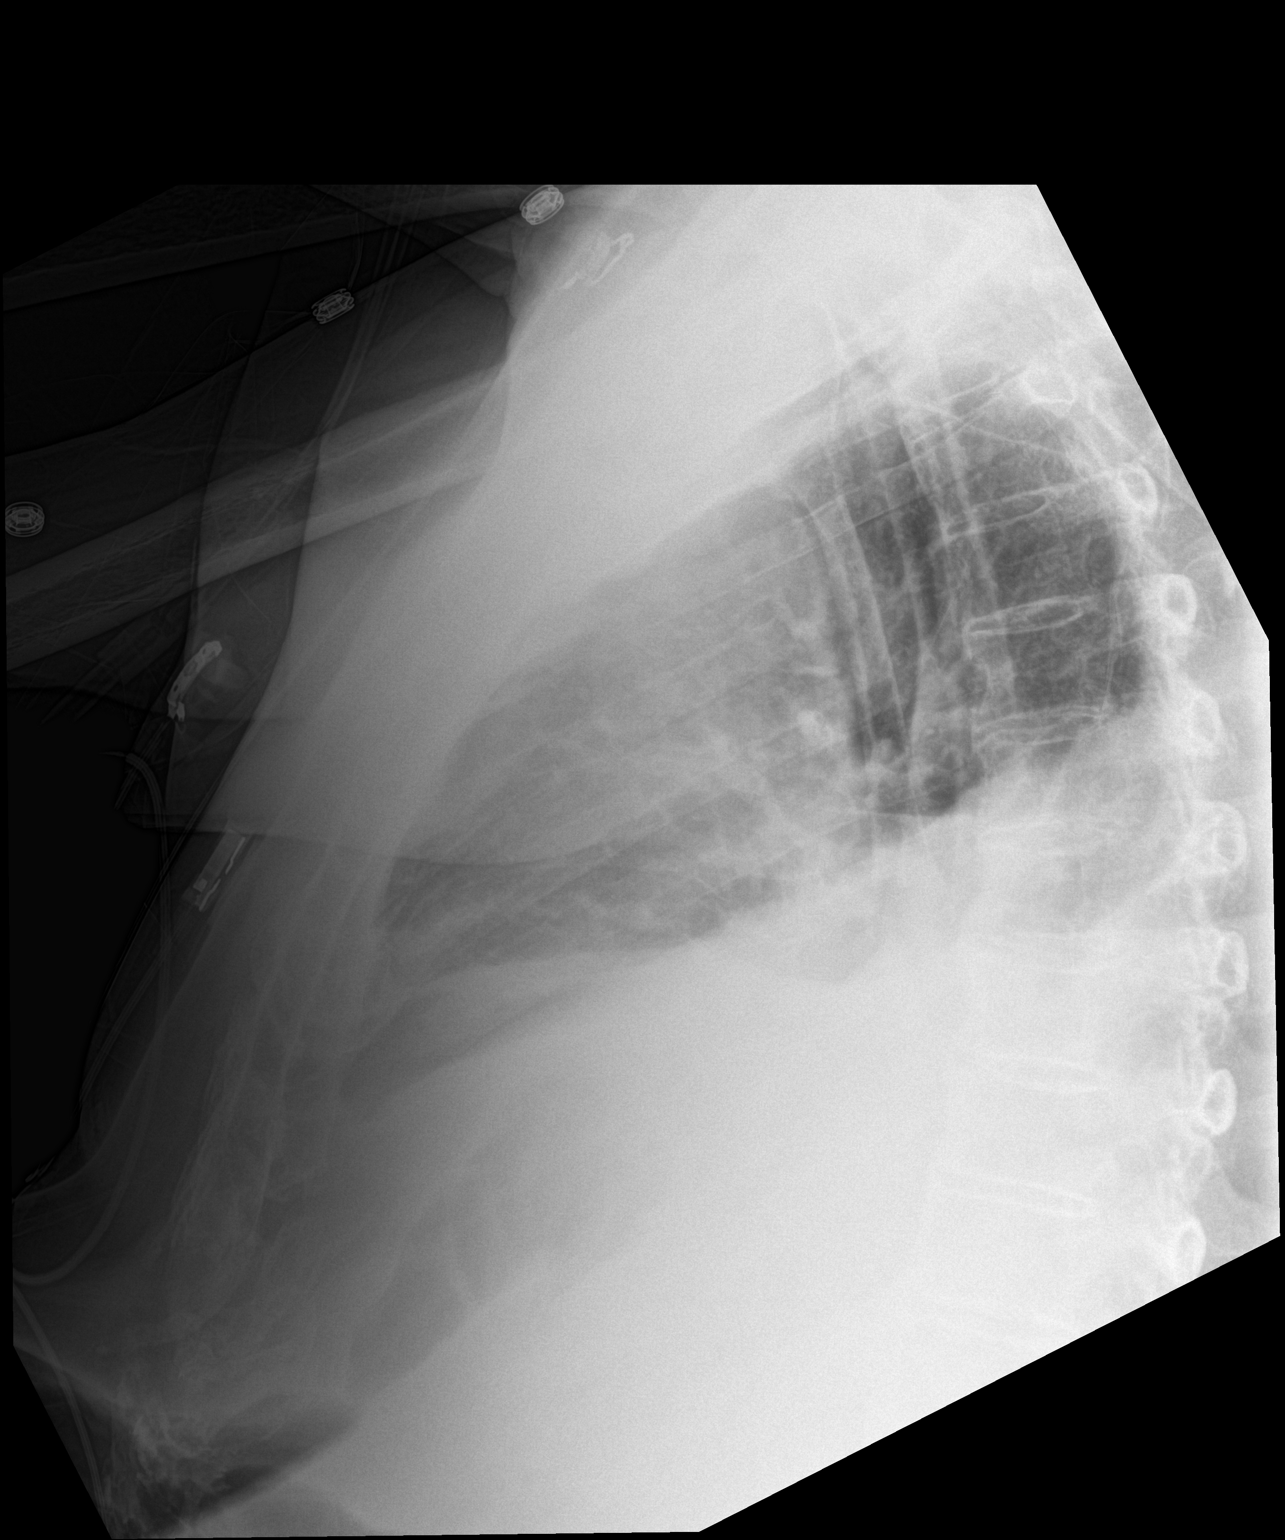

[chest ap]
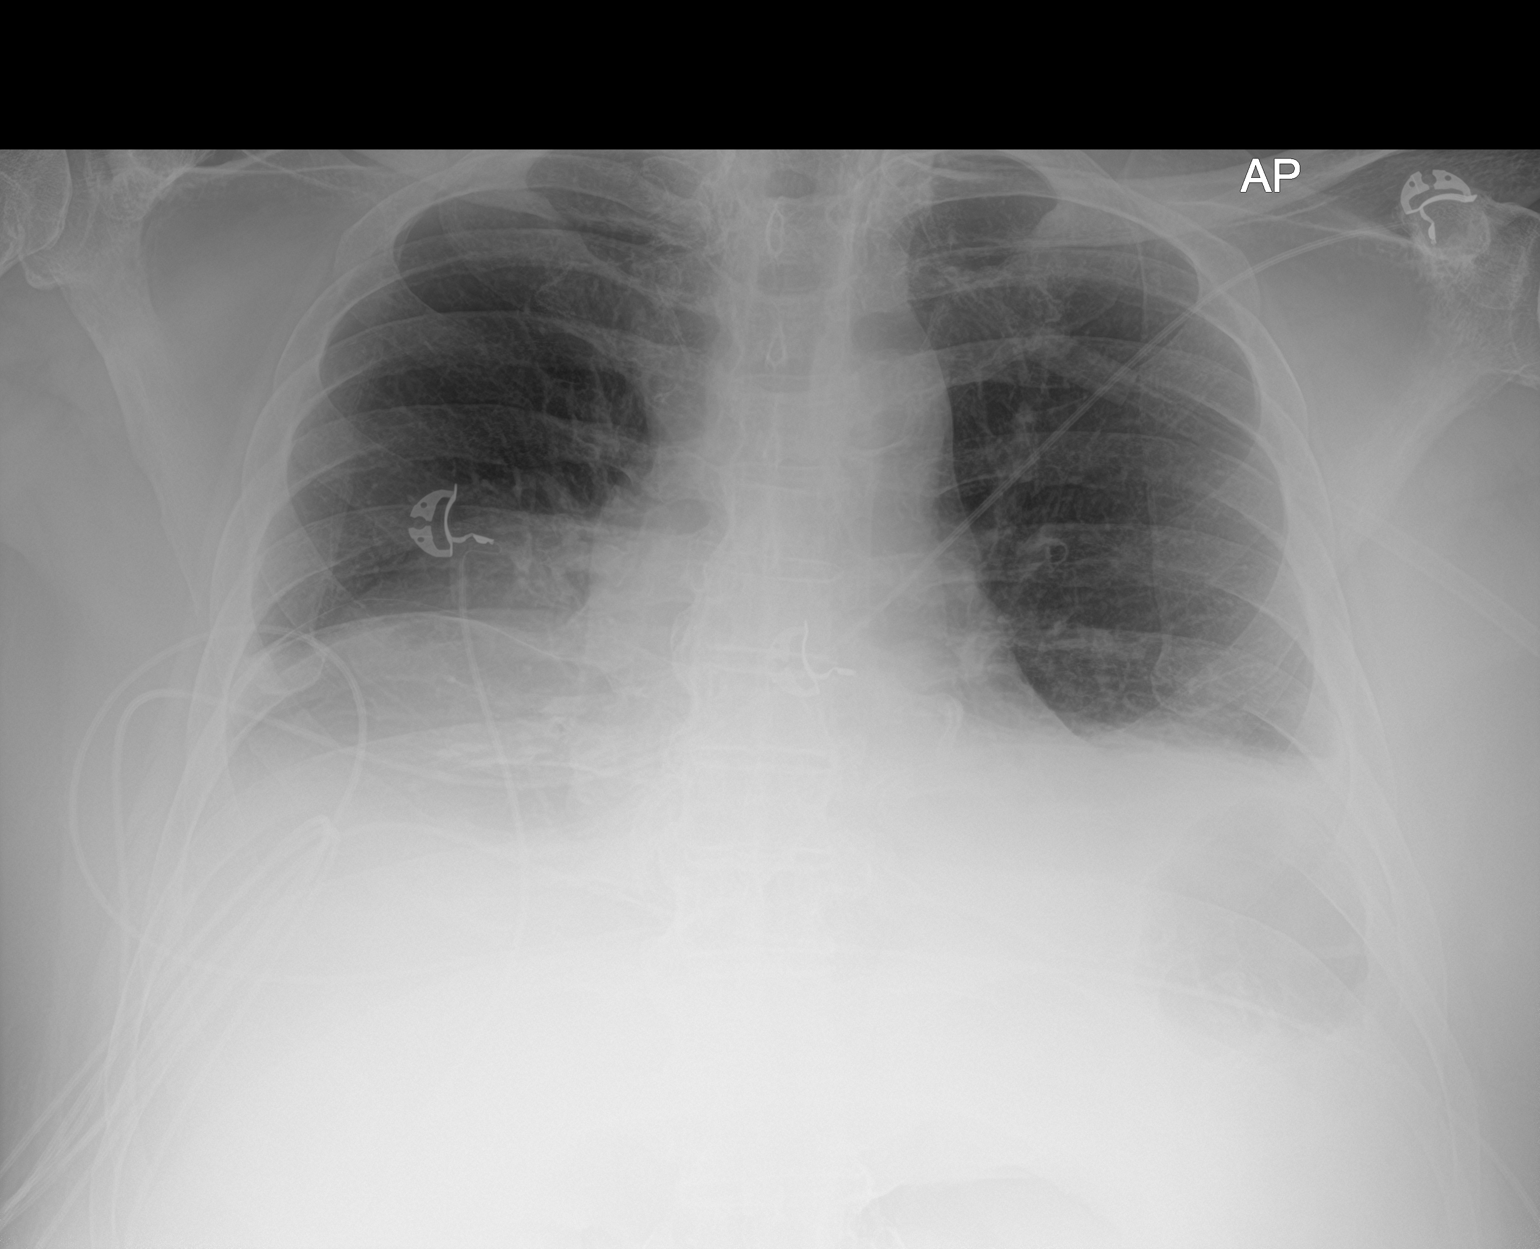

[2 of 2 positions shown; findings below may reference images not displayed]

FINDINGS: Hypoventilation. Decreased lung volume with bibasilar atelectasis.
Small bilateral pleural effusions right greater than left. Negative
for edema. Upper lobes clear
IMPRESSION: Bibasilar atelectasis and bilateral effusions right greater than
left. Negative for edema.

## 2020-04-15 IMAGING — US US ABDOMEN COMPLETE
1 series · 13 of 25 positions shown · non-contrast
Comparison: Ultrasound 09/12/2015, 09/06/2013

CLINICAL DATA: Abdomen distension

EXAM:
ABDOMEN ULTRASOUND COMPLETE

[Series 1: us abdomen complete · 0.32mm/px · 13 of 83 slices shown]
[im 1/83]
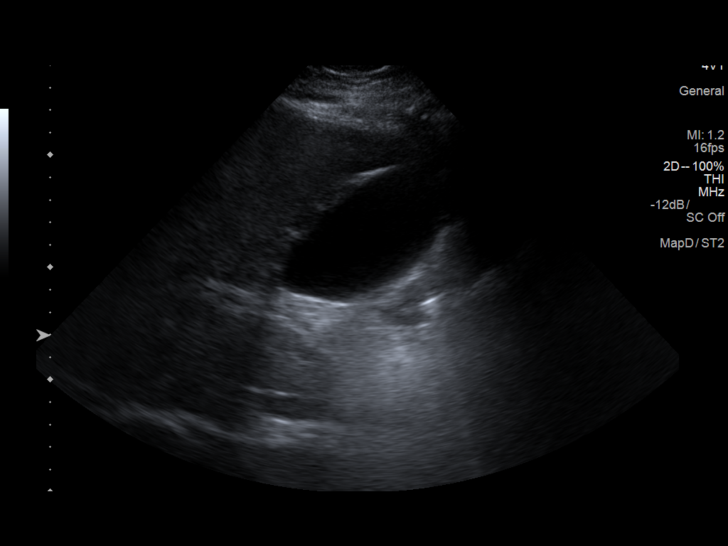
[im 7/83]
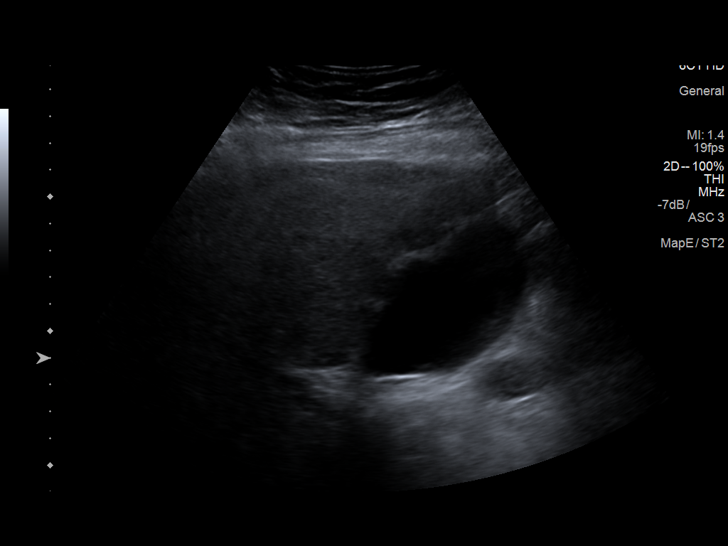
[im 14/83]
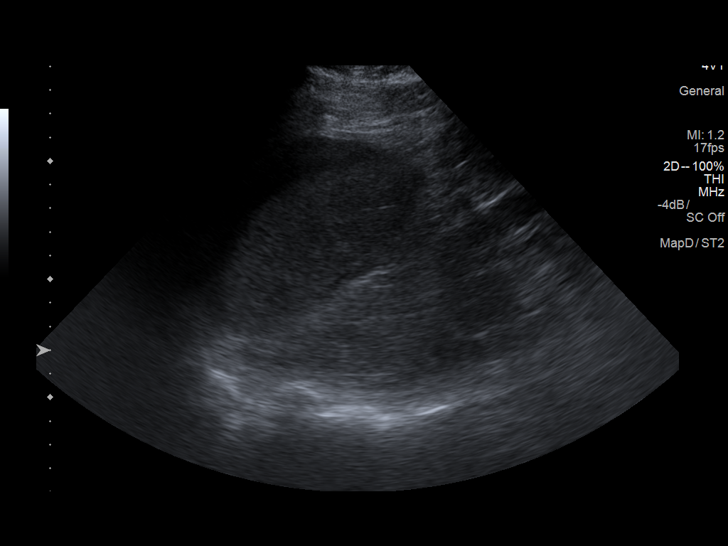
[im 21/83]
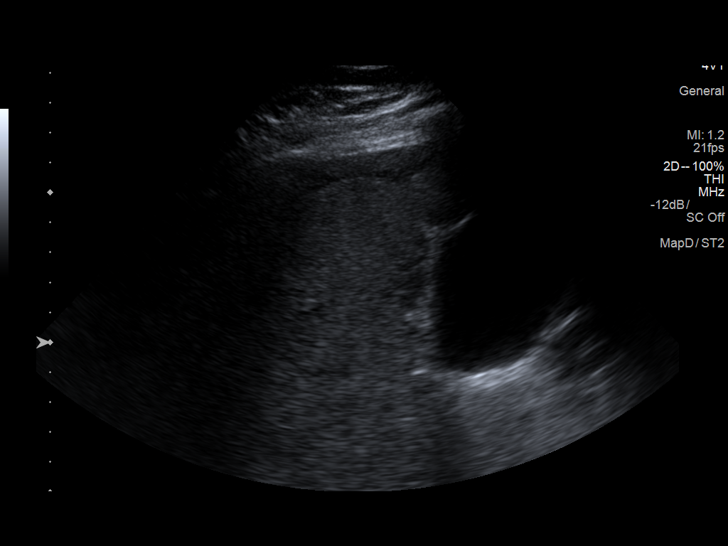
[im 28/83]
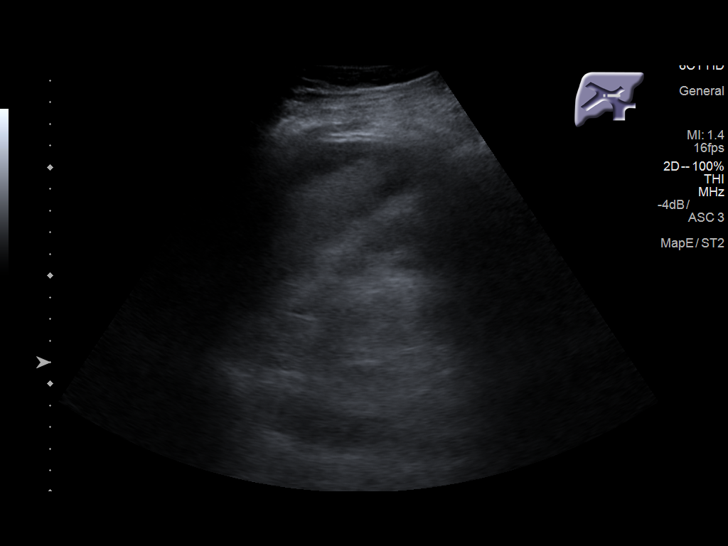
[im 35/83]
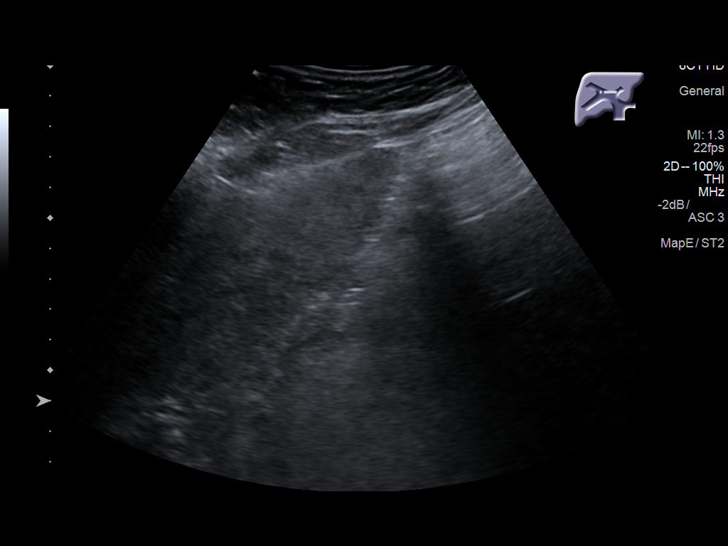
[im 42/83]
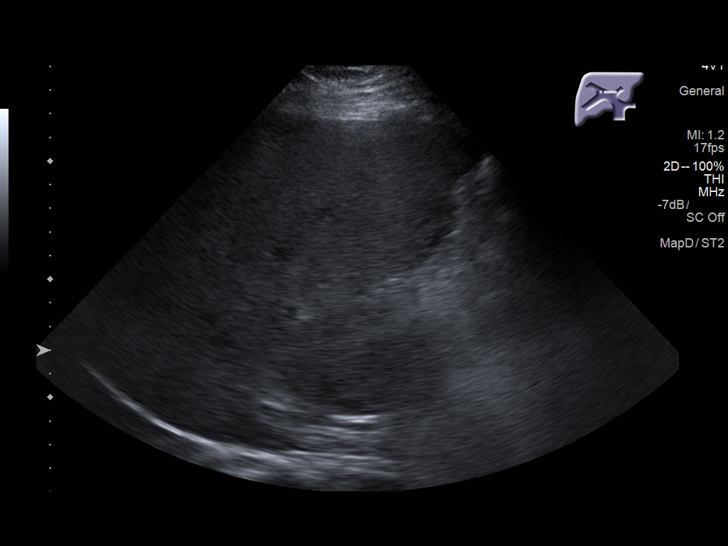
[im 48/83]
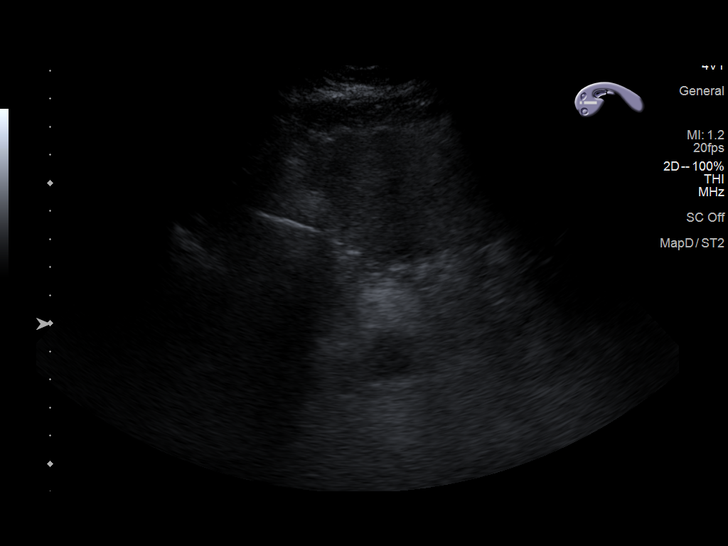
[im 55/83]
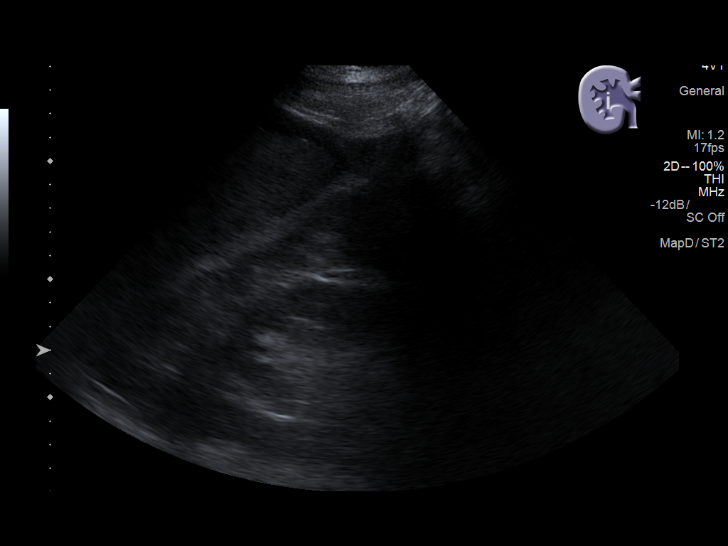
[im 62/83]
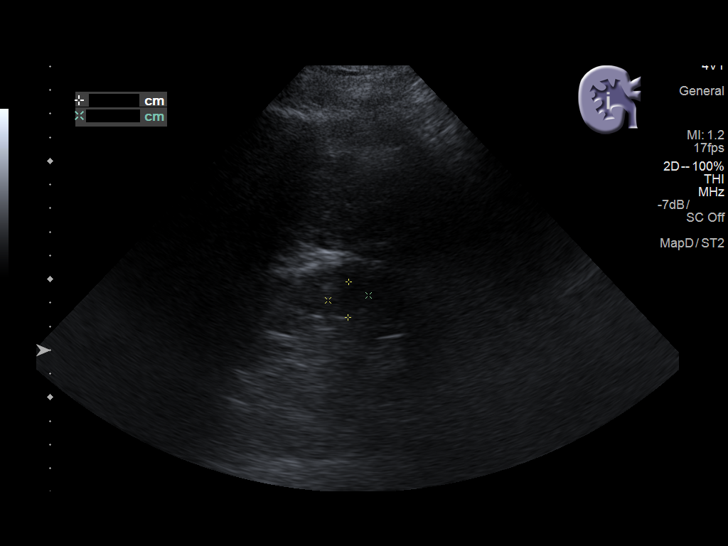
[im 69/83]
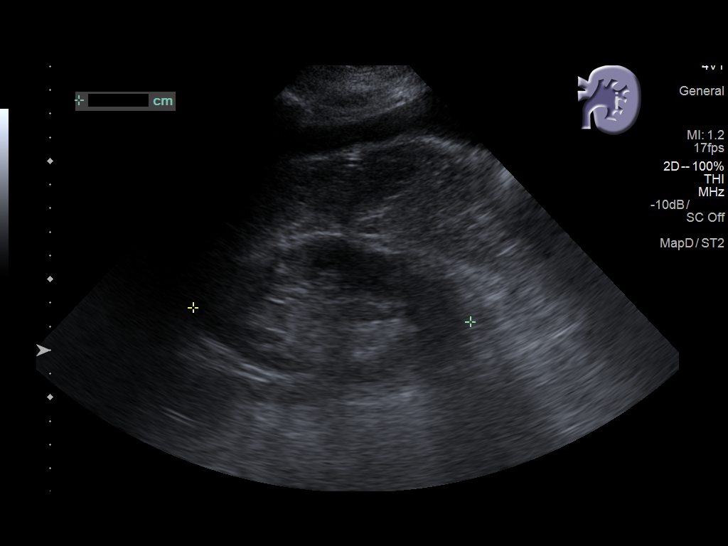
[im 76/83]
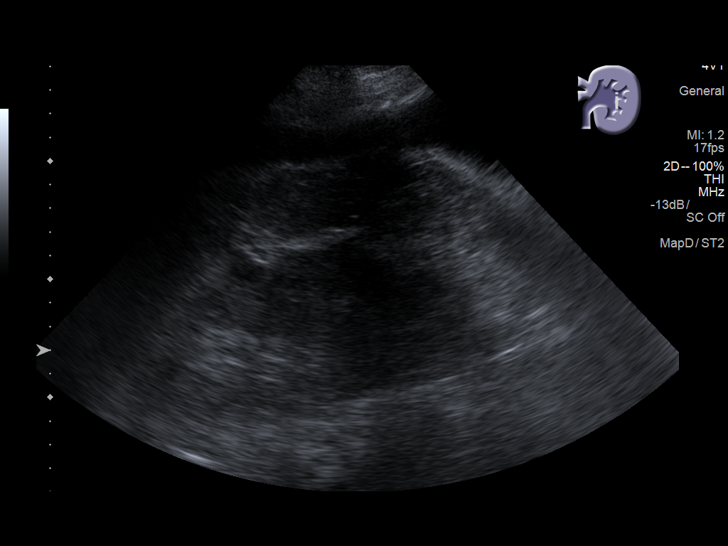
[im 83/83]
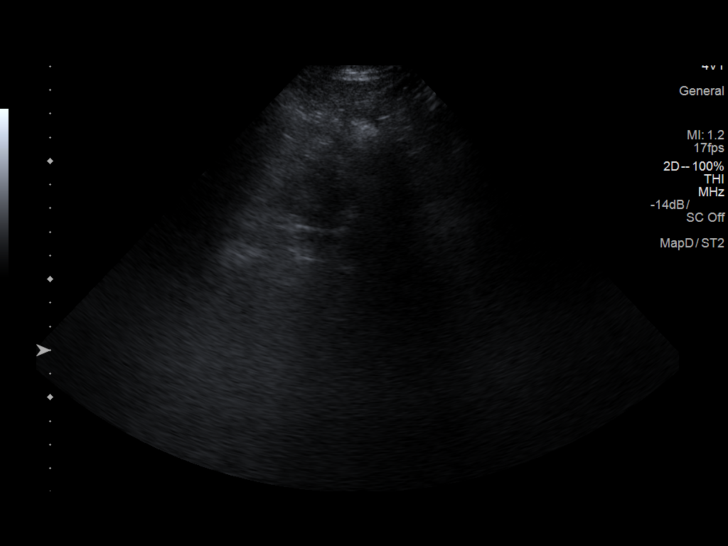

[13 of 25 positions shown; findings below may reference images not displayed]

FINDINGS: Gallbladder: No gallstones or wall thickening visualized. No
sonographic Murphy sign noted by sonographer.

Common bile duct: Diameter: 4.5 mm

Liver: Nodular contour with heterogeneous echotexture consistent
with cirrhosis. Diffuse increased echogenicity with probable fatty
sparing near the gallbladder fossa. Portal vein is patent on color
Doppler imaging with normal direction of blood flow towards the
liver.

IVC: No abnormality visualized.

Pancreas: Poorly visualized due to gas.

Spleen: Size and appearance within normal limits.

Right Kidney: Length: 12.4 cm. Cortical echogenicity within normal
limits. No hydronephrosis. Small cyst at the mid to lower pole
measuring 1.7 cm.

Left Kidney: Length: 11.7 cm. Cortical echogenicity within normal
limits. 1.4 cm hypoechoic lesion upper pole left kidney,
indeterminate.

Abdominal aorta: No aneurysm visualized.  Maximum diameter 2.6 cm.

Other findings: Small amount of ascites.
IMPRESSION: 1. Cirrhosis of the liver.  Small amount of abdominal ascites.
2. Negative for gallstones or biliary dilatation
3. 1.4 cm hypoechoic lesion upper pole left kidney, complex cystic
versus small solid nodule. Could further evaluate with dedicated
renal CT or MRI when clinically feasible.
